# Patient Record
Sex: Female | Born: 1950 | Race: White | Hispanic: No | Marital: Married
Health system: Midwestern US, Academic
[De-identification: ages and names within clinical notes are randomized; demographics above are authoritative.]

---

## 2017-07-09 ENCOUNTER — Encounter: Admit: 2017-07-09 | Discharge: 2017-07-09 | Payer: MEDICARE

## 2017-07-09 ENCOUNTER — Ambulatory Visit: Admit: 2017-07-09 | Discharge: 2017-07-09 | Payer: MEDICARE

## 2017-07-09 DIAGNOSIS — R32 Unspecified urinary incontinence: ICD-10-CM

## 2017-07-09 DIAGNOSIS — E669 Obesity, unspecified: ICD-10-CM

## 2017-07-09 DIAGNOSIS — G629 Polyneuropathy, unspecified: ICD-10-CM

## 2017-07-09 DIAGNOSIS — M4802 Spinal stenosis, cervical region: ICD-10-CM

## 2017-07-09 DIAGNOSIS — H02889 Meibomian gland dysfunction of unspecified eye, unspecified eyelid: ICD-10-CM

## 2017-07-09 DIAGNOSIS — R4189 Other symptoms and signs involving cognitive functions and awareness: ICD-10-CM

## 2017-07-09 DIAGNOSIS — R413 Other amnesia: ICD-10-CM

## 2017-07-09 DIAGNOSIS — E119 Type 2 diabetes mellitus without complications: ICD-10-CM

## 2017-07-09 DIAGNOSIS — R3989 Other symptoms and signs involving the genitourinary system: ICD-10-CM

## 2017-07-09 DIAGNOSIS — M1990 Unspecified osteoarthritis, unspecified site: Principal | ICD-10-CM

## 2017-07-09 DIAGNOSIS — R29898 Other symptoms and signs involving the musculoskeletal system: ICD-10-CM

## 2017-07-09 DIAGNOSIS — G479 Sleep disorder, unspecified: ICD-10-CM

## 2017-07-09 DIAGNOSIS — E039 Hypothyroidism, unspecified: ICD-10-CM

## 2017-07-09 DIAGNOSIS — J45909 Unspecified asthma, uncomplicated: ICD-10-CM

## 2017-07-09 DIAGNOSIS — H5111 Convergence insufficiency: Principal | ICD-10-CM

## 2017-07-09 DIAGNOSIS — J309 Allergic rhinitis, unspecified: ICD-10-CM

## 2017-07-09 DIAGNOSIS — G4733 Obstructive sleep apnea (adult) (pediatric): ICD-10-CM

## 2017-07-09 DIAGNOSIS — H547 Unspecified visual loss: ICD-10-CM

## 2017-07-09 DIAGNOSIS — G47419 Narcolepsy without cataplexy: ICD-10-CM

## 2017-07-09 DIAGNOSIS — K219 Gastro-esophageal reflux disease without esophagitis: ICD-10-CM

## 2017-07-09 DIAGNOSIS — M48061 Spinal stenosis, lumbar region without neurogenic claudication: ICD-10-CM

## 2017-07-09 DIAGNOSIS — H5052 Exophoria: ICD-10-CM

## 2017-07-09 DIAGNOSIS — J40 Bronchitis, not specified as acute or chronic: ICD-10-CM

## 2017-07-09 DIAGNOSIS — F445 Conversion disorder with seizures or convulsions: ICD-10-CM

## 2017-07-09 DIAGNOSIS — K760 Fatty (change of) liver, not elsewhere classified: ICD-10-CM

## 2017-07-09 DIAGNOSIS — E785 Hyperlipidemia, unspecified: ICD-10-CM

## 2017-07-09 NOTE — Progress Notes
Body mass index is 36.55 kg/m.              Assessment and Plan:  1.  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  Convergence insufficiency  3.  MGD  4. Cataracts  The patient thought 4 pd base in helped her at both distance and near.  We'll give her that with a Fresnel prism 4 pd base in OS.  IF it helps, it can be ground into her lenses.

## 2017-07-16 ENCOUNTER — Encounter: Admit: 2017-07-16 | Discharge: 2017-07-16 | Payer: MEDICARE

## 2017-07-16 DIAGNOSIS — J309 Allergic rhinitis, unspecified: ICD-10-CM

## 2017-07-16 DIAGNOSIS — K760 Fatty (change of) liver, not elsewhere classified: ICD-10-CM

## 2017-07-16 DIAGNOSIS — H547 Unspecified visual loss: ICD-10-CM

## 2017-07-16 DIAGNOSIS — R4189 Other symptoms and signs involving cognitive functions and awareness: ICD-10-CM

## 2017-07-16 DIAGNOSIS — R29898 Other symptoms and signs involving the musculoskeletal system: ICD-10-CM

## 2017-07-16 DIAGNOSIS — K219 Gastro-esophageal reflux disease without esophagitis: ICD-10-CM

## 2017-07-16 DIAGNOSIS — G47419 Narcolepsy without cataplexy: ICD-10-CM

## 2017-07-16 DIAGNOSIS — E785 Hyperlipidemia, unspecified: ICD-10-CM

## 2017-07-16 DIAGNOSIS — G479 Sleep disorder, unspecified: ICD-10-CM

## 2017-07-16 DIAGNOSIS — R3989 Other symptoms and signs involving the genitourinary system: ICD-10-CM

## 2017-07-16 DIAGNOSIS — M48061 Spinal stenosis, lumbar region without neurogenic claudication: ICD-10-CM

## 2017-07-16 DIAGNOSIS — E119 Type 2 diabetes mellitus without complications: ICD-10-CM

## 2017-07-16 DIAGNOSIS — R32 Unspecified urinary incontinence: ICD-10-CM

## 2017-07-16 DIAGNOSIS — E039 Hypothyroidism, unspecified: ICD-10-CM

## 2017-07-16 DIAGNOSIS — R413 Other amnesia: ICD-10-CM

## 2017-07-16 DIAGNOSIS — M1990 Unspecified osteoarthritis, unspecified site: Principal | ICD-10-CM

## 2017-07-16 DIAGNOSIS — F445 Conversion disorder with seizures or convulsions: ICD-10-CM

## 2017-07-16 DIAGNOSIS — G629 Polyneuropathy, unspecified: ICD-10-CM

## 2017-07-16 DIAGNOSIS — M4802 Spinal stenosis, cervical region: ICD-10-CM

## 2017-07-16 DIAGNOSIS — G4733 Obstructive sleep apnea (adult) (pediatric): ICD-10-CM

## 2017-07-16 DIAGNOSIS — E669 Obesity, unspecified: ICD-10-CM

## 2017-07-16 DIAGNOSIS — J45909 Unspecified asthma, uncomplicated: ICD-10-CM

## 2017-07-16 DIAGNOSIS — J40 Bronchitis, not specified as acute or chronic: ICD-10-CM

## 2017-07-16 NOTE — Telephone Encounter
Initial Visit Date: 07/30/17- update since last appt on 11/06/15    Reason for Visit: neck pain, neuropathy, lumbar stenosis, headache, fogginess, word finding, seizures, feels like she is in a daze, imbalance    Physician Information  PCP: Dr. Isidore Moos at [facility]  Referring Physician: N/a previous pt of Dr. Barbera Setters  Previous Neurologist: Dr. Lindon Romp at [facility]  Other providers seen: [facility] (OR), Dr. Candee [facility], Dr. Verlan [facility], Dr. Harrell Lark [facility], Dr. Aurelio Jew & Dr. Vallarie Mare [facility], Dr. Bronwen Betters [facility], Dr. Kayren Eaves [facility]    Previous Imaging/Procedures:  MRI head- 02/12/15 [place]; 08/29/00, 10/03/99 [facility]  MRA head/neck- 10/03/99 [facility]  MRI C-spine- 01/24/17, 02/10/15 [place]  MRI L-spine- 01/24/17, 07/01/15 [place]; 06/08/14 [facility]  CT head- 11/24/04 [place]; 05/14/14, 01/30/14 [facility]  CT C-spine- 10/23/15, 07/19/15, 06/05/15 [place]  CT L-spine- 10/31/15 [place]  CT myelogram- 10/31/15 [place]  XRay C-spine- 02/05/17, 04/08/16, 12/04/15, 10/23/15, 09/11/15, 04/10/15 [place]  XRay L-spine- 02/05/17, 12/04/15, 07/01/15 ; 02/13/05 [facility]  EMG none  EEG 08/29/00 [facility]  Sleep study- 08/03/15 ; 06/07/14 [facility]  Video swallow- [facility] 2018    ED/Hospitalizations:  none    Current Meds:  Tylenol, norco, mobic, lyrica, effexor XR    Medications, Allergies, History Updated

## 2017-07-22 ENCOUNTER — Encounter: Admit: 2017-07-22 | Discharge: 2017-07-22 | Payer: MEDICARE

## 2017-07-22 ENCOUNTER — Encounter: Admit: 2017-07-22 | Discharge: 2017-07-23 | Payer: MEDICARE

## 2017-07-22 DIAGNOSIS — R69 Illness, unspecified: Principal | ICD-10-CM

## 2017-07-24 ENCOUNTER — Encounter: Admit: 2017-07-24 | Discharge: 2017-07-24 | Payer: MEDICARE

## 2017-07-24 ENCOUNTER — Encounter: Admit: 2017-07-24 | Discharge: 2017-07-25 | Payer: MEDICARE

## 2017-07-24 DIAGNOSIS — R69 Illness, unspecified: Principal | ICD-10-CM

## 2017-07-30 ENCOUNTER — Encounter: Admit: 2017-07-30 | Discharge: 2017-07-30 | Payer: MEDICARE

## 2017-07-30 DIAGNOSIS — H547 Unspecified visual loss: ICD-10-CM

## 2017-07-30 DIAGNOSIS — R413 Other amnesia: ICD-10-CM

## 2017-07-30 DIAGNOSIS — E669 Obesity, unspecified: ICD-10-CM

## 2017-07-30 DIAGNOSIS — R4189 Other symptoms and signs involving cognitive functions and awareness: ICD-10-CM

## 2017-07-30 DIAGNOSIS — K760 Fatty (change of) liver, not elsewhere classified: ICD-10-CM

## 2017-07-30 DIAGNOSIS — J40 Bronchitis, not specified as acute or chronic: ICD-10-CM

## 2017-07-30 DIAGNOSIS — R29898 Other symptoms and signs involving the musculoskeletal system: ICD-10-CM

## 2017-07-30 DIAGNOSIS — E039 Hypothyroidism, unspecified: ICD-10-CM

## 2017-07-30 DIAGNOSIS — M1990 Unspecified osteoarthritis, unspecified site: Principal | ICD-10-CM

## 2017-07-30 DIAGNOSIS — G629 Polyneuropathy, unspecified: ICD-10-CM

## 2017-07-30 DIAGNOSIS — R32 Unspecified urinary incontinence: ICD-10-CM

## 2017-07-30 DIAGNOSIS — M4802 Spinal stenosis, cervical region: ICD-10-CM

## 2017-07-30 DIAGNOSIS — E785 Hyperlipidemia, unspecified: ICD-10-CM

## 2017-07-30 DIAGNOSIS — K219 Gastro-esophageal reflux disease without esophagitis: ICD-10-CM

## 2017-07-30 DIAGNOSIS — M48061 Spinal stenosis, lumbar region without neurogenic claudication: ICD-10-CM

## 2017-07-30 DIAGNOSIS — G4733 Obstructive sleep apnea (adult) (pediatric): ICD-10-CM

## 2017-07-30 DIAGNOSIS — J309 Allergic rhinitis, unspecified: ICD-10-CM

## 2017-07-30 DIAGNOSIS — R3989 Other symptoms and signs involving the genitourinary system: ICD-10-CM

## 2017-07-30 DIAGNOSIS — E119 Type 2 diabetes mellitus without complications: ICD-10-CM

## 2017-07-30 DIAGNOSIS — G479 Sleep disorder, unspecified: ICD-10-CM

## 2017-07-30 DIAGNOSIS — G47419 Narcolepsy without cataplexy: ICD-10-CM

## 2017-07-30 DIAGNOSIS — J45909 Unspecified asthma, uncomplicated: ICD-10-CM

## 2017-07-30 DIAGNOSIS — F445 Conversion disorder with seizures or convulsions: ICD-10-CM
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
## 2017-07-31 ENCOUNTER — Ambulatory Visit: Admit: 2017-07-30 | Discharge: 2017-07-31 | Payer: MEDICARE

## 2017-07-31 DIAGNOSIS — R55 Syncope and collapse: Principal | ICD-10-CM

## 2017-07-31 DIAGNOSIS — R569 Unspecified convulsions: ICD-10-CM

## 2017-07-31 LAB — CREATININE: Lab: 0.8 mg/dL (ref 0.50–0.99)

## 2017-07-31 LAB — BUN: Lab: 26 mg/dL — ABNORMAL HIGH (ref 7–25)

## 2017-08-07 ENCOUNTER — Ambulatory Visit: Admit: 2017-08-07 | Discharge: 2017-08-07 | Payer: MEDICARE

## 2017-08-07 DIAGNOSIS — R55 Syncope and collapse: Principal | ICD-10-CM

## 2017-08-07 MED ORDER — SODIUM CHLORIDE 0.9 % IJ SOLN
50 mL | Freq: Once | INTRAVENOUS | 0 refills | Status: CP
Start: 2017-08-07 — End: ?
  Administered 2017-08-07: 21:00:00 50 mL via INTRAVENOUS

## 2017-08-07 MED ORDER — IOHEXOL 350 MG IODINE/ML IV SOLN
80 mL | Freq: Once | INTRAVENOUS | 0 refills | Status: CP
Start: 2017-08-07 — End: ?
  Administered 2017-08-07: 21:00:00 80 mL via INTRAVENOUS

## 2017-08-12 ENCOUNTER — Encounter: Admit: 2017-08-12 | Discharge: 2017-08-12 | Payer: MEDICARE

## 2017-08-12 DIAGNOSIS — R69 Illness, unspecified: Principal | ICD-10-CM

## 2017-08-27 ENCOUNTER — Encounter: Admit: 2017-08-27 | Discharge: 2017-08-27 | Payer: MEDICARE

## 2017-08-27 DIAGNOSIS — M9989 Other biomechanical lesions of abdomen and other regions: Principal | ICD-10-CM

## 2017-09-01 ENCOUNTER — Encounter: Admit: 2017-09-01 | Discharge: 2017-09-01 | Payer: MEDICARE

## 2017-09-22 ENCOUNTER — Ambulatory Visit: Admit: 2017-09-22 | Discharge: 2017-09-22 | Payer: MEDICARE

## 2017-09-22 ENCOUNTER — Encounter: Admit: 2017-09-22 | Discharge: 2017-09-22 | Payer: MEDICARE

## 2017-09-22 DIAGNOSIS — R55 Syncope and collapse: Principal | ICD-10-CM

## 2017-09-22 NOTE — Progress Notes
EEG REPORT  SHEKIRA DRUMMER   Aug 24, 1951  [id number]      DATE OF STUDY 09/22/17  Start time 1338  End time 1445        PATIENT HISTORY: This is a 66 y.o. female with a history of prior diagnosis of psychogenic non-epileptic seizures now wiht a new type of spell.  MEDICATIONS:  Current Outpatient Prescriptions on File Prior to Encounter   Medication Sig Dispense Refill   ??? acetaminophen (TYLENOL) 325 mg tablet Take 2 Tabs by mouth every 4 hours as needed. (Patient taking differently: Take 650 mg by mouth as Needed.)  0   ??? aspirin 81 mg chewable tablet Chew 81 mg by mouth daily.     ??? CALCIUM CARBONATE (CALTRATE 600 PO) Take 1 Tab by mouth twice daily.     ??? ergocalciferol (VITAMIN D-2) 50,000 unit capsule Take 50,000 Units by mouth every 7 days.     ??? escitalopram oxalate (LEXAPRO) 20 mg tablet Take 20 mg by mouth at bedtime daily.     ??? fenofibrate nanocrystallized (TRICOR) 145 mg tablet Take 145 mg by mouth at bedtime daily.     ??? fexofenadine(+) (ALLEGRA) 180 mg tablet Take 180 mg by mouth at bedtime daily.     ??? fish oil- omega 3-DHA/EPA 300/1,000 mg capsule Take 1 Cap by mouth at bedtime daily.     ??? furosemide (LASIX) 40 mg tablet Take 40 mg by mouth as Needed.     ??? HYDROcodone/acetaminophen (NORCO) 5/325 mg tablet Take 1 tablet by mouth every 8 hours as needed for Pain 40 tablet 0   ??? insulin aspart (NOVOLOG) 100 unit/mL injection Take 25 units with breakfast, 22 units with lunch, and 30 units with dinner.  Indications: TYPE 2 DIABETES MELLITUS (Patient taking differently: Take 25 units with breakfast, 22 units with lunch, and 30 units with dinner. Sliding scale) 15 mL 1   ??? Insulin Needles (Disposable) (NANO PEN NEEDLE) 32 gauge x 5/32 ndle Use 1 Each as directed before meals and at bedtime. 100 Each 1   ??? Insulin NPH (HUMULIN N KWIKPEN) 100 unit/mL (3 mL) injection PEN Take 25 units in AM and 42 units at bedtime. (Patient taking differently: Take 50 units at bedtime.) 15 mL 1 ??? levothyroxine (SYNTHROID) 88 mcg tablet Take 88 mcg by mouth daily.     ??? meloxicam (MOBIC) 15 mg tablet Take 15 mg by mouth daily.     ??? metFORMIN-XR(+) (GLUCOPHAGE XR) 500 mg extended release tablet Take 500 mg by mouth twice daily.     ??? montelukast (SINGULAIR) 10 mg tablet Take 10 mg by mouth at bedtime daily.     ??? MULTIVITAMINS WITH FLUORIDE (MULTI-VITAMIN PO) Take 1 Tab by mouth at bedtime daily.     ??? nystatin (MYCOSTATIN) 100,000 unit/g topical cream Apply  topically to affected area daily as needed.     ??? oxybutynin XL (DITROPAN XL) 10 mg tablet Take 1 Tab by mouth daily. Do not cut/ crush/ chew 90 Tab 3   ??? oxyCODONE (ROXICODONE, OXY-IR) 5 mg tablet Take 1-3 Tabs by mouth every 4 hours as needed for Pain (Patient taking differently: Take 1-3 Tabs by mouth as Needed for Pain ) 180 Tab 0   ??? pantoprazole DR (PROTONIX) 40 mg tablet Take 40 mg by mouth twice daily.     ??? potassium chloride SR (K-DUR) 20 mEq tablet Take 20 mEq by mouth as Needed. With lasix     ??? Pramipexole (MIRAPEX) 0.75  mg tab Take 0.75 mg by mouth twice daily.     ??? senna/docusate (SENOKOT-S) 8.6/50 mg tablet Take 1 Tab by mouth twice daily. (Patient taking differently: Take 1 tablet by mouth twice daily as needed.)  0   ??? simvastatin (ZOCOR) 40 mg tablet Take 40 mg by mouth at bedtime daily.     ??? sinus rinse pkdv kit Insert 1 Packet into nose as directed as Needed.     ??? venlafaxine XR (EFFEXOR XR) 37.5 mg capsule Take 37.5 mg by mouth daily.     ??? vitamin E 400 unit capsule Take 400 mg by mouth daily.       No current facility-administered medications on file prior to encounter.          TECHNICAL: This is EEG was intended to be awake, drowsy and asleep and greater than one hour in duration. The EEG was performed on a 32 channel digital recording device. The 10-20 international system of electrode placement was used. Hyperventilation was performed. Photic stimulation was performed.    REPORT:    BACKGROUND: Background activity consisted of moderate voltage 10 Hz activity. This activity attenuates with eye opening. Beta activity was present in the bifrontal head regions.     SLEEP:  As the patient entered drowsiness there was an attenuation of background rhythms. Stage II sleep was seen as evidenced by sleep spindles and K-complexes.    Hyperventilation produced no changes.   Photic Stimulation produced bilateral driving of the occipital rhythms.      ABNORMALITIES:  Epileptiform activity was not seen.  Slowing was not seen.      IMPRESSION    This is a normal awake, drowsy and asleep EEG. No epileptiform activity was seen.       Jacobo Forest, MD

## 2017-09-22 NOTE — Progress Notes
65 minute out patient EEG completed without difficulty.

## 2017-09-22 NOTE — Telephone Encounter
EEG 09/22/17 read by Dr. Sheran Luz: IMPRESSION This is a normal awake, drowsy and asleep EEG. No epileptiform activity was seen.     Please let Hailey Rodgers know that the EEG was normal. We also have an appt later this week to discuss how she is doing.
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
## 2017-09-23 NOTE — Telephone Encounter
Notified patient of results, patient verbalized understanding.

## 2017-09-24 ENCOUNTER — Encounter: Admit: 2017-09-24 | Discharge: 2017-09-24 | Payer: MEDICARE

## 2017-09-24 ENCOUNTER — Ambulatory Visit: Admit: 2017-09-24 | Discharge: 2017-09-25 | Payer: MEDICARE

## 2017-09-24 DIAGNOSIS — J309 Allergic rhinitis, unspecified: ICD-10-CM

## 2017-09-24 DIAGNOSIS — R6889 Other general symptoms and signs: Secondary | ICD-10-CM

## 2017-09-24 DIAGNOSIS — J40 Bronchitis, not specified as acute or chronic: ICD-10-CM

## 2017-09-24 DIAGNOSIS — G47419 Narcolepsy without cataplexy: ICD-10-CM

## 2017-09-24 DIAGNOSIS — K219 Gastro-esophageal reflux disease without esophagitis: ICD-10-CM

## 2017-09-24 DIAGNOSIS — H547 Unspecified visual loss: ICD-10-CM

## 2017-09-24 DIAGNOSIS — E119 Type 2 diabetes mellitus without complications: ICD-10-CM

## 2017-09-24 DIAGNOSIS — M1990 Unspecified osteoarthritis, unspecified site: Principal | ICD-10-CM

## 2017-09-24 DIAGNOSIS — J45909 Unspecified asthma, uncomplicated: ICD-10-CM

## 2017-09-24 DIAGNOSIS — E785 Hyperlipidemia, unspecified: ICD-10-CM

## 2017-09-24 DIAGNOSIS — R4189 Other symptoms and signs involving cognitive functions and awareness: ICD-10-CM

## 2017-09-24 DIAGNOSIS — R413 Other amnesia: ICD-10-CM

## 2017-09-24 DIAGNOSIS — M48061 Spinal stenosis, lumbar region without neurogenic claudication: ICD-10-CM

## 2017-09-24 DIAGNOSIS — R3989 Other symptoms and signs involving the genitourinary system: ICD-10-CM

## 2017-09-24 DIAGNOSIS — K760 Fatty (change of) liver, not elsewhere classified: ICD-10-CM

## 2017-09-24 DIAGNOSIS — R32 Unspecified urinary incontinence: ICD-10-CM

## 2017-09-24 DIAGNOSIS — F445 Conversion disorder with seizures or convulsions: ICD-10-CM

## 2017-09-24 DIAGNOSIS — M4802 Spinal stenosis, cervical region: ICD-10-CM

## 2017-09-24 DIAGNOSIS — E669 Obesity, unspecified: ICD-10-CM

## 2017-09-24 DIAGNOSIS — E039 Hypothyroidism, unspecified: ICD-10-CM

## 2017-09-24 DIAGNOSIS — G4733 Obstructive sleep apnea (adult) (pediatric): ICD-10-CM

## 2017-09-24 DIAGNOSIS — G479 Sleep disorder, unspecified: ICD-10-CM

## 2017-09-24 DIAGNOSIS — G629 Polyneuropathy, unspecified: ICD-10-CM

## 2017-09-24 DIAGNOSIS — R29898 Other symptoms and signs involving the musculoskeletal system: ICD-10-CM

## 2017-09-25 DIAGNOSIS — F332 Major depressive disorder, recurrent severe without psychotic features: Principal | ICD-10-CM

## 2017-10-08 ENCOUNTER — Encounter: Admit: 2017-10-08 | Discharge: 2017-10-08 | Payer: MEDICARE

## 2017-10-08 ENCOUNTER — Ambulatory Visit: Admit: 2017-10-08 | Discharge: 2017-10-09 | Payer: MEDICARE

## 2017-10-08 DIAGNOSIS — E785 Hyperlipidemia, unspecified: ICD-10-CM

## 2017-10-08 DIAGNOSIS — R29898 Other symptoms and signs involving the musculoskeletal system: ICD-10-CM

## 2017-10-08 DIAGNOSIS — R4189 Other symptoms and signs involving cognitive functions and awareness: ICD-10-CM

## 2017-10-08 DIAGNOSIS — E119 Type 2 diabetes mellitus without complications: ICD-10-CM

## 2017-10-08 DIAGNOSIS — E039 Hypothyroidism, unspecified: ICD-10-CM

## 2017-10-08 DIAGNOSIS — F445 Conversion disorder with seizures or convulsions: ICD-10-CM

## 2017-10-08 DIAGNOSIS — K760 Fatty (change of) liver, not elsewhere classified: ICD-10-CM

## 2017-10-08 DIAGNOSIS — G4733 Obstructive sleep apnea (adult) (pediatric): ICD-10-CM

## 2017-10-08 DIAGNOSIS — M48061 Spinal stenosis, lumbar region without neurogenic claudication: ICD-10-CM

## 2017-10-08 DIAGNOSIS — H5111 Convergence insufficiency: Principal | ICD-10-CM

## 2017-10-08 DIAGNOSIS — M1990 Unspecified osteoarthritis, unspecified site: Principal | ICD-10-CM

## 2017-10-08 DIAGNOSIS — H547 Unspecified visual loss: ICD-10-CM

## 2017-10-08 DIAGNOSIS — M4802 Spinal stenosis, cervical region: ICD-10-CM

## 2017-10-08 DIAGNOSIS — R413 Other amnesia: ICD-10-CM

## 2017-10-08 DIAGNOSIS — J45909 Unspecified asthma, uncomplicated: ICD-10-CM

## 2017-10-08 DIAGNOSIS — K219 Gastro-esophageal reflux disease without esophagitis: ICD-10-CM

## 2017-10-08 DIAGNOSIS — J40 Bronchitis, not specified as acute or chronic: ICD-10-CM

## 2017-10-08 DIAGNOSIS — R3989 Other symptoms and signs involving the genitourinary system: ICD-10-CM

## 2017-10-08 DIAGNOSIS — G629 Polyneuropathy, unspecified: ICD-10-CM

## 2017-10-08 DIAGNOSIS — J309 Allergic rhinitis, unspecified: ICD-10-CM

## 2017-10-08 DIAGNOSIS — R32 Unspecified urinary incontinence: ICD-10-CM

## 2017-10-08 DIAGNOSIS — G479 Sleep disorder, unspecified: ICD-10-CM

## 2017-10-08 DIAGNOSIS — G47419 Narcolepsy without cataplexy: ICD-10-CM

## 2017-10-08 DIAGNOSIS — E669 Obesity, unspecified: ICD-10-CM

## 2017-10-08 NOTE — Progress Notes
There is no height or weight on file to calculate BMI.              Assessment and Plan:  1.  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  Convergence insufficiency  3.  MGD  4. Cataracts    The patient thought the prism helped her read so we'll give her scripts for two glasses:  One bifocal without prism, and then prism readers with 2 base in each eye.

## 2017-10-21 ENCOUNTER — Ambulatory Visit: Admit: 2017-10-21 | Discharge: 2017-10-21 | Payer: MEDICARE

## 2017-10-21 DIAGNOSIS — F411 Generalized anxiety disorder: ICD-10-CM

## 2017-10-21 DIAGNOSIS — F341 Dysthymic disorder: Principal | ICD-10-CM

## 2017-10-21 MED ORDER — FLUOXETINE 20 MG PO CAP
20 mg | ORAL_CAPSULE | Freq: Every day | ORAL | 2 refills | Status: AC
Start: 2017-10-21 — End: 2017-11-25
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
## 2017-10-27 NOTE — Progress Notes
ATTENDING NOTE      Encounter Date: 10/21/2017    I saw and evaluated Hailey Rodgers, discussed with Joana Reamer, MD and concur with the assessment and treatment plan.     PRESENTING PROBLEM AND BACKGROUND: Patient is 66 y.o. female with Persistent depressive disorder and GAD.    CURRENT TREATMENT AND RESPONSES:   Presents to clinic to establish care for worsening depression and anxiety.  ALso reports some intermittent passive suicidal thoughts which she denies at the time of her psychiatry evaluation.    PLAN:  1. Will transition from Lexapro to Prozac due to lack of benefit with Lexapro per her report.  2. Decrease Lexapro to 10 mg daily for 1 week, then stop.  3. Start Fluoxetine 20 mg daily for mood.  4. Encouraged her to engage in therapy.

## 2017-11-25 ENCOUNTER — Ambulatory Visit: Admit: 2017-11-25 | Discharge: 2017-11-25 | Payer: MEDICARE

## 2017-11-25 DIAGNOSIS — F341 Dysthymic disorder: Principal | ICD-10-CM

## 2017-11-25 DIAGNOSIS — F411 Generalized anxiety disorder: ICD-10-CM

## 2017-11-25 MED ORDER — HYDROXYZINE HCL 10 MG PO TAB
10 mg | ORAL_TABLET | Freq: Two times a day (BID) | ORAL | 2 refills | 30.00000 days | Status: AC | PRN
Start: 2017-11-25 — End: 2017-11-25

## 2017-11-25 MED ORDER — HYDROXYZINE HCL 10 MG PO TAB
10 mg | ORAL_TABLET | Freq: Two times a day (BID) | ORAL | 2 refills | 30.00000 days | Status: AC | PRN
Start: 2017-11-25 — End: 2018-06-03

## 2017-11-25 MED ORDER — FLUOXETINE 20 MG PO CAP
40 mg | ORAL_CAPSULE | Freq: Every day | ORAL | 2 refills | Status: AC
Start: 2017-11-25 — End: 2018-02-23

## 2017-11-25 NOTE — Progress Notes
Subjective:       Hailey Rodgers is 66 y.o. presents with history of Persistent Depressive Disorder with anxious distress, GAD with panic attacks presents for follow up today after her first initial visit 10/21/17. Patient was later joined by her daughter to provide support and collaberation.  ???  Patient states things are not going good for her. She states she is much more short tempered. She states last week really stressed her related to financial stress and a poor relationship with her husband. She states she even started to feel suicidal but without a plan. Her family are strong protective factors for her which would keep her from killing herself. She states she enjoys giving gifts to others for happiness but is limited by her finances. She states she is now overdrawn at the bank which makes her feel anxious and worried. She is unsure how to solve the problem. Sleep is so-so at night with frequent awakenings and leg pain. Appetite is stable. Concentration and attention are reduced. She states she is only able to watch tv at small increments of time. She denies SI/HI/AVH.      Social history update: Lives at home with her husband and daughter. 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 No ETOH, no illicit drugs.              Review of Systems   HENT: Negative for rhinorrhea, sinus pain and sinus pressure.    Respiratory: Negative for cough.    Cardiovascular: Negative for chest pain and palpitations.   Gastrointestinal: Negative for constipation, diarrhea and nausea.   Musculoskeletal: Positive for arthralgias.   Neurological: Positive for weakness.   Psychiatric/Behavioral: Positive for agitation, decreased concentration, dysphoric mood and sleep disturbance. Negative for behavioral problems, confusion, hallucinations, self-injury and suicidal ideas. The patient is nervous/anxious. The patient is not hyperactive.          Objective: ??? acetaminophen (TYLENOL) 325 mg tablet Take 2 Tabs by mouth every 4 hours as needed. (Patient taking differently: Take 650 mg by mouth as Needed.)   ??? aspirin 81 mg chewable tablet Chew 81 mg by mouth daily.   ??? CALCIUM CARBONATE (CALTRATE 600 PO) Take 1 Tab by mouth twice daily.   ??? celecoxib (CELEBREX) 100 mg capsule Take 100 mg by mouth twice daily.   ??? ergocalciferol (VITAMIN D-2) 50,000 unit capsule Take 50,000 Units by mouth every 7 days.   ??? fenofibrate nanocrystallized (TRICOR) 145 mg tablet Take 145 mg by mouth at bedtime daily.   ??? fexofenadine(+) (ALLEGRA) 180 mg tablet Take 180 mg by mouth at bedtime daily.   ??? fish oil- omega 3-DHA/EPA 300/1,000 mg capsule Take 1 Cap by mouth at bedtime daily.   ??? fluoxetine (PROZAC) 20 mg capsule Take one capsule by mouth daily.   ??? furosemide (LASIX) 40 mg tablet Take 40 mg by mouth as Needed.   ??? HYDROcodone/acetaminophen (NORCO) 5/325 mg tablet Take 1 tablet by mouth every 8 hours as needed for Pain   ??? insulin aspart (NOVOLOG) 100 unit/mL injection Take 25 units with breakfast, 22 units with lunch, and 30 units with dinner.  Indications: TYPE 2 DIABETES MELLITUS (Patient taking differently: Take 25 units with breakfast, 22 units with lunch, and 30 units with dinner. Sliding scale)   ??? Insulin Needles (Disposable) (NANO PEN NEEDLE) 32 gauge x 5/32 ndle Use 1 Each as directed before meals and at bedtime.   ??? Insulin NPH (HUMULIN N KWIKPEN) 100 unit/mL (3 mL) injection PEN Take 25  units in AM and 42 units at bedtime. (Patient taking differently: Take 50 units at bedtime.)   ??? levothyroxine (SYNTHROID) 88 mcg tablet Take 88 mcg by mouth daily.   ??? metFORMIN-XR(+) (GLUCOPHAGE XR) 500 mg extended release tablet Take 500 mg by mouth twice daily.   ??? montelukast (SINGULAIR) 10 mg tablet Take 10 mg by mouth at bedtime daily.   ??? MULTIVITAMINS WITH FLUORIDE (MULTI-VITAMIN PO) Take 1 Tab by mouth at bedtime daily. ??? nystatin (MYCOSTATIN) 100,000 unit/g topical cream Apply  topically to affected area daily as needed.   ??? oxybutynin XL (DITROPAN XL) 10 mg tablet Take 1 Tab by mouth daily. Do not cut/ crush/ chew   ??? oxyCODONE (ROXICODONE, OXY-IR) 5 mg tablet Take 1-3 Tabs by mouth every 4 hours as needed for Pain (Patient taking differently: Take 1-3 Tabs by mouth as Needed for Pain )   ??? pantoprazole DR (PROTONIX) 40 mg tablet Take 40 mg by mouth twice daily.   ??? potassium chloride SR (K-DUR) 20 mEq tablet Take 20 mEq by mouth as Needed. With lasix   ??? Pramipexole (MIRAPEX) 0.75 mg tab Take 0.75 mg by mouth twice daily.   ??? senna/docusate (SENOKOT-S) 8.6/50 mg tablet Take 1 Tab by mouth twice daily. (Patient taking differently: Take 1 tablet by mouth twice daily as needed.)   ??? simvastatin (ZOCOR) 40 mg tablet Take 40 mg by mouth at bedtime daily.   ??? sinus rinse pkdv kit Insert 1 Packet into nose as directed as Needed.   ??? vitamin E 400 unit capsule Take 400 mg by mouth daily.     Vitals:    11/25/17 1445   BP: 148/55   Pulse: 75   Weight: 101.6 kg (224 lb)   Height: 172.2 cm (67.8)     Body mass index is 34.26 kg/m???.     Physical Exam   Psychiatric:   General/Constitutional: patient is a 66 year old Caucasian female dressed in age appropriate attire.  Eye Contact: maintained throughout interview.  Behavior: cooperative and pleasant  Speech: normal RRTV  Mood: depressed  Affect: dysphoric, mood congruent  Thought Process: linear and goal directed  Thought Content: denies SI/HI  Perception: denies AVH  Associations: intact  Insight: fair  Judgement: fair      Gait: normal               Assessment and Plan:  IMPRESSION DIAGNOSIS:                 AXIS I:  Persistent Depressive Disorder with anxious distress.                          GAD with panic attacks               AXIS II:  deferred               AXIS III:  DM, Hypothyroid, Restless leg, Pseudoseizures, neck and back pain AXIS IV:  PSYCHOSOCIAL STRESSORS:  Finanical, poor support network, chronic illness.               AXIS V:  CURRENT GAF:  55  ???  ???Patient presents today with increased agitation and depression since her last visit and start of fluoxetine 20mg  po dialy. She was accompanied by her daughter who also confirms increased agitation and anger outbursts. Patient has been taking fluoxetine for one month will tapering off of Lexapro. Will increase fluoxetine from 20mg   po daily to 40mg  po daily with addition of hydroxyzine 10mg  po BID prn for anxiety and agitation. Patient to return in one month or may call before if needed.    - Increase fluoxetine 40mg  po daily for mood.   - Start hydroxyzine 10mg  po BID prn for anxiety and agitation.   -Recommended outpatient psychotherapy for support; patient states she recently turned in her paperwork.  ???  ???  RTC: 4 weeks.  ???  Seen and discussed with Dr. Hulen Skains who agrees with the paln.  ???  The proposed treatment plan was discussed with the patient/guardian who was provided the opportunity to ask questions and make suggestions regarding alternative treatment.   ???  Patient informed of after hours crisis services with resident physician on call, 911 and ER if she was to become suicidal or in need of acute care. Patient agrees with the plan if needed.   ???  ???

## 2017-11-26 NOTE — Progress Notes
I performed a history and physical examination of Hailey Rodgers and discussed the management with Joana Reamer, MD.  I reviewed the resident's note and agree with the documented findings and plan of care.    Hailey Rodgers is here for follow up with her daughter.  Discussed more anxiety/agitation since transitioning from Lexapro to Prozac.  But suspect this is related to the long half life of Prozac.  She is agreeable to increasing the Prozac today to help with mood/anxiety.  She noted she wants to be "natural" without medications.  We discussed that depression/anxiety are diseases just like her diabetes and she takes insulin to feel natural so she may need to continue medications and she noted that made sense.    Brunetta Jeans May, MD

## 2017-12-30 ENCOUNTER — Ambulatory Visit: Admit: 2017-12-30 | Discharge: 2017-12-30 | Payer: MEDICARE

## 2017-12-30 ENCOUNTER — Encounter: Admit: 2017-12-30 | Discharge: 2017-12-30 | Payer: MEDICARE

## 2017-12-30 DIAGNOSIS — F341 Dysthymic disorder: ICD-10-CM

## 2017-12-30 DIAGNOSIS — F411 Generalized anxiety disorder: Principal | ICD-10-CM

## 2018-01-26 ENCOUNTER — Ambulatory Visit: Admit: 2018-01-26 | Discharge: 2018-01-26 | Payer: MEDICARE

## 2018-01-26 ENCOUNTER — Encounter: Admit: 2018-01-26 | Discharge: 2018-01-26 | Payer: MEDICARE

## 2018-01-26 DIAGNOSIS — F410 Panic disorder [episodic paroxysmal anxiety] without agoraphobia: ICD-10-CM

## 2018-01-26 DIAGNOSIS — F411 Generalized anxiety disorder: ICD-10-CM

## 2018-01-26 DIAGNOSIS — R413 Other amnesia: ICD-10-CM

## 2018-01-26 DIAGNOSIS — G629 Polyneuropathy, unspecified: ICD-10-CM

## 2018-01-26 DIAGNOSIS — E119 Type 2 diabetes mellitus without complications: ICD-10-CM

## 2018-01-26 DIAGNOSIS — H547 Unspecified visual loss: ICD-10-CM

## 2018-01-26 DIAGNOSIS — J309 Allergic rhinitis, unspecified: ICD-10-CM

## 2018-01-26 DIAGNOSIS — J45909 Unspecified asthma, uncomplicated: ICD-10-CM

## 2018-01-26 DIAGNOSIS — Z639 Problem related to primary support group, unspecified: ICD-10-CM

## 2018-01-26 DIAGNOSIS — R29898 Other symptoms and signs involving the musculoskeletal system: ICD-10-CM

## 2018-01-26 DIAGNOSIS — M4802 Spinal stenosis, cervical region: ICD-10-CM

## 2018-01-26 DIAGNOSIS — G47419 Narcolepsy without cataplexy: ICD-10-CM

## 2018-01-26 DIAGNOSIS — J40 Bronchitis, not specified as acute or chronic: ICD-10-CM

## 2018-01-26 DIAGNOSIS — R4189 Other symptoms and signs involving cognitive functions and awareness: ICD-10-CM

## 2018-01-26 DIAGNOSIS — F445 Conversion disorder with seizures or convulsions: ICD-10-CM

## 2018-01-26 DIAGNOSIS — E669 Obesity, unspecified: ICD-10-CM

## 2018-01-26 DIAGNOSIS — E785 Hyperlipidemia, unspecified: ICD-10-CM

## 2018-01-26 DIAGNOSIS — F3341 Major depressive disorder, recurrent, in partial remission: Principal | ICD-10-CM

## 2018-01-26 DIAGNOSIS — R32 Unspecified urinary incontinence: ICD-10-CM

## 2018-01-26 DIAGNOSIS — F341 Dysthymic disorder: Principal | ICD-10-CM

## 2018-01-26 DIAGNOSIS — Z72820 Sleep deprivation: ICD-10-CM

## 2018-01-26 DIAGNOSIS — K219 Gastro-esophageal reflux disease without esophagitis: ICD-10-CM

## 2018-01-26 DIAGNOSIS — M48061 Spinal stenosis, lumbar region without neurogenic claudication: ICD-10-CM

## 2018-01-26 DIAGNOSIS — G4733 Obstructive sleep apnea (adult) (pediatric): ICD-10-CM

## 2018-01-26 DIAGNOSIS — M1990 Unspecified osteoarthritis, unspecified site: Principal | ICD-10-CM

## 2018-01-26 DIAGNOSIS — R3989 Other symptoms and signs involving the genitourinary system: ICD-10-CM

## 2018-01-26 DIAGNOSIS — E039 Hypothyroidism, unspecified: ICD-10-CM

## 2018-01-26 DIAGNOSIS — K760 Fatty (change of) liver, not elsewhere classified: ICD-10-CM

## 2018-01-26 DIAGNOSIS — G479 Sleep disorder, unspecified: ICD-10-CM

## 2018-02-09 ENCOUNTER — Ambulatory Visit: Admit: 2018-02-09 | Discharge: 2018-02-10 | Payer: MEDICARE

## 2018-02-09 DIAGNOSIS — Z639 Problem related to primary support group, unspecified: ICD-10-CM

## 2018-02-09 DIAGNOSIS — F410 Panic disorder [episodic paroxysmal anxiety] without agoraphobia: ICD-10-CM

## 2018-02-09 DIAGNOSIS — F411 Generalized anxiety disorder: ICD-10-CM

## 2018-02-09 DIAGNOSIS — F3341 Major depressive disorder, recurrent, in partial remission: Principal | ICD-10-CM

## 2018-02-13 ENCOUNTER — Encounter: Admit: 2018-02-13 | Discharge: 2018-02-13 | Payer: MEDICARE

## 2018-02-13 DIAGNOSIS — E039 Hypothyroidism, unspecified: ICD-10-CM

## 2018-02-13 DIAGNOSIS — R32 Unspecified urinary incontinence: ICD-10-CM

## 2018-02-13 DIAGNOSIS — J309 Allergic rhinitis, unspecified: ICD-10-CM

## 2018-02-13 DIAGNOSIS — R3989 Other symptoms and signs involving the genitourinary system: ICD-10-CM

## 2018-02-13 DIAGNOSIS — E119 Type 2 diabetes mellitus without complications: ICD-10-CM

## 2018-02-13 DIAGNOSIS — M4802 Spinal stenosis, cervical region: ICD-10-CM

## 2018-02-13 DIAGNOSIS — F445 Conversion disorder with seizures or convulsions: ICD-10-CM

## 2018-02-13 DIAGNOSIS — R29898 Other symptoms and signs involving the musculoskeletal system: ICD-10-CM

## 2018-02-13 DIAGNOSIS — E669 Obesity, unspecified: ICD-10-CM

## 2018-02-13 DIAGNOSIS — M48061 Spinal stenosis, lumbar region without neurogenic claudication: ICD-10-CM

## 2018-02-13 DIAGNOSIS — R4189 Other symptoms and signs involving cognitive functions and awareness: ICD-10-CM

## 2018-02-13 DIAGNOSIS — G479 Sleep disorder, unspecified: ICD-10-CM

## 2018-02-13 DIAGNOSIS — J40 Bronchitis, not specified as acute or chronic: ICD-10-CM

## 2018-02-13 DIAGNOSIS — G47419 Narcolepsy without cataplexy: ICD-10-CM

## 2018-02-13 DIAGNOSIS — M1990 Unspecified osteoarthritis, unspecified site: Principal | ICD-10-CM

## 2018-02-13 DIAGNOSIS — G629 Polyneuropathy, unspecified: ICD-10-CM

## 2018-02-13 DIAGNOSIS — G4733 Obstructive sleep apnea (adult) (pediatric): ICD-10-CM

## 2018-02-13 DIAGNOSIS — H547 Unspecified visual loss: ICD-10-CM

## 2018-02-13 DIAGNOSIS — K760 Fatty (change of) liver, not elsewhere classified: ICD-10-CM

## 2018-02-13 DIAGNOSIS — E785 Hyperlipidemia, unspecified: ICD-10-CM

## 2018-02-13 DIAGNOSIS — J45909 Unspecified asthma, uncomplicated: ICD-10-CM

## 2018-02-13 DIAGNOSIS — R413 Other amnesia: ICD-10-CM

## 2018-02-13 DIAGNOSIS — K219 Gastro-esophageal reflux disease without esophagitis: ICD-10-CM

## 2018-02-16 ENCOUNTER — Ambulatory Visit: Admit: 2018-02-16 | Discharge: 2018-02-16 | Payer: MEDICARE

## 2018-02-16 DIAGNOSIS — Z639 Problem related to primary support group, unspecified: ICD-10-CM

## 2018-02-16 DIAGNOSIS — F3341 Major depressive disorder, recurrent, in partial remission: Principal | ICD-10-CM

## 2018-02-16 DIAGNOSIS — F410 Panic disorder [episodic paroxysmal anxiety] without agoraphobia: ICD-10-CM

## 2018-02-16 DIAGNOSIS — F411 Generalized anxiety disorder: ICD-10-CM

## 2018-02-19 ENCOUNTER — Encounter: Admit: 2018-02-19 | Discharge: 2018-02-19 | Payer: MEDICARE

## 2018-02-23 ENCOUNTER — Encounter: Admit: 2018-02-23 | Discharge: 2018-02-23 | Payer: MEDICARE

## 2018-02-23 DIAGNOSIS — E669 Obesity, unspecified: ICD-10-CM

## 2018-02-23 DIAGNOSIS — G47419 Narcolepsy without cataplexy: ICD-10-CM

## 2018-02-23 DIAGNOSIS — M1990 Unspecified osteoarthritis, unspecified site: Principal | ICD-10-CM

## 2018-02-23 DIAGNOSIS — R29898 Other symptoms and signs involving the musculoskeletal system: ICD-10-CM

## 2018-02-23 DIAGNOSIS — M4802 Spinal stenosis, cervical region: ICD-10-CM

## 2018-02-23 DIAGNOSIS — R4189 Other symptoms and signs involving cognitive functions and awareness: ICD-10-CM

## 2018-02-23 DIAGNOSIS — F445 Conversion disorder with seizures or convulsions: ICD-10-CM

## 2018-02-23 DIAGNOSIS — E039 Hypothyroidism, unspecified: ICD-10-CM

## 2018-02-23 DIAGNOSIS — R413 Other amnesia: ICD-10-CM

## 2018-02-23 DIAGNOSIS — E785 Hyperlipidemia, unspecified: ICD-10-CM

## 2018-02-23 DIAGNOSIS — G479 Sleep disorder, unspecified: ICD-10-CM

## 2018-02-23 DIAGNOSIS — J40 Bronchitis, not specified as acute or chronic: ICD-10-CM

## 2018-02-23 DIAGNOSIS — H547 Unspecified visual loss: ICD-10-CM

## 2018-02-23 DIAGNOSIS — R3989 Other symptoms and signs involving the genitourinary system: ICD-10-CM

## 2018-02-23 DIAGNOSIS — M48061 Spinal stenosis, lumbar region without neurogenic claudication: ICD-10-CM

## 2018-02-23 DIAGNOSIS — R32 Unspecified urinary incontinence: ICD-10-CM

## 2018-02-23 DIAGNOSIS — K219 Gastro-esophageal reflux disease without esophagitis: ICD-10-CM

## 2018-02-23 DIAGNOSIS — E119 Type 2 diabetes mellitus without complications: ICD-10-CM

## 2018-02-23 DIAGNOSIS — K760 Fatty (change of) liver, not elsewhere classified: ICD-10-CM

## 2018-02-23 DIAGNOSIS — J45909 Unspecified asthma, uncomplicated: ICD-10-CM

## 2018-02-23 DIAGNOSIS — G629 Polyneuropathy, unspecified: ICD-10-CM

## 2018-02-23 DIAGNOSIS — G4733 Obstructive sleep apnea (adult) (pediatric): ICD-10-CM

## 2018-02-23 DIAGNOSIS — J309 Allergic rhinitis, unspecified: ICD-10-CM

## 2018-02-23 MED ORDER — FLUOXETINE 20 MG PO CAP
ORAL_CAPSULE | Freq: Every day | 2 refills | Status: AC
Start: 2018-02-23 — End: 2018-03-31

## 2018-03-24 ENCOUNTER — Encounter: Admit: 2018-03-24 | Discharge: 2018-03-24 | Payer: MEDICARE

## 2018-03-24 ENCOUNTER — Ambulatory Visit: Admit: 2018-03-24 | Discharge: 2018-03-25 | Payer: MEDICARE

## 2018-03-24 DIAGNOSIS — E119 Type 2 diabetes mellitus without complications: ICD-10-CM

## 2018-03-24 DIAGNOSIS — G629 Polyneuropathy, unspecified: ICD-10-CM

## 2018-03-24 DIAGNOSIS — J40 Bronchitis, not specified as acute or chronic: ICD-10-CM

## 2018-03-24 DIAGNOSIS — E039 Hypothyroidism, unspecified: ICD-10-CM

## 2018-03-24 DIAGNOSIS — K219 Gastro-esophageal reflux disease without esophagitis: ICD-10-CM

## 2018-03-24 DIAGNOSIS — R29898 Other symptoms and signs involving the musculoskeletal system: ICD-10-CM

## 2018-03-24 DIAGNOSIS — J45909 Unspecified asthma, uncomplicated: ICD-10-CM

## 2018-03-24 DIAGNOSIS — J309 Allergic rhinitis, unspecified: ICD-10-CM

## 2018-03-24 DIAGNOSIS — H547 Unspecified visual loss: ICD-10-CM

## 2018-03-24 DIAGNOSIS — G47419 Narcolepsy without cataplexy: ICD-10-CM

## 2018-03-24 DIAGNOSIS — M48061 Spinal stenosis, lumbar region without neurogenic claudication: ICD-10-CM

## 2018-03-24 DIAGNOSIS — E669 Obesity, unspecified: ICD-10-CM

## 2018-03-24 DIAGNOSIS — M4802 Spinal stenosis, cervical region: ICD-10-CM

## 2018-03-24 DIAGNOSIS — R3989 Other symptoms and signs involving the genitourinary system: ICD-10-CM

## 2018-03-24 DIAGNOSIS — R4189 Other symptoms and signs involving cognitive functions and awareness: ICD-10-CM

## 2018-03-24 DIAGNOSIS — R413 Other amnesia: ICD-10-CM

## 2018-03-24 DIAGNOSIS — E785 Hyperlipidemia, unspecified: ICD-10-CM

## 2018-03-24 DIAGNOSIS — K760 Fatty (change of) liver, not elsewhere classified: ICD-10-CM

## 2018-03-24 DIAGNOSIS — F445 Conversion disorder with seizures or convulsions: ICD-10-CM

## 2018-03-24 DIAGNOSIS — G4733 Obstructive sleep apnea (adult) (pediatric): ICD-10-CM

## 2018-03-24 DIAGNOSIS — M1990 Unspecified osteoarthritis, unspecified site: Principal | ICD-10-CM

## 2018-03-24 DIAGNOSIS — R32 Unspecified urinary incontinence: ICD-10-CM

## 2018-03-24 DIAGNOSIS — G479 Sleep disorder, unspecified: ICD-10-CM

## 2018-03-25 DIAGNOSIS — R6889 Other general symptoms and signs: Principal | ICD-10-CM

## 2018-03-26 ENCOUNTER — Encounter: Admit: 2018-03-26 | Discharge: 2018-03-26 | Payer: MEDICARE

## 2018-03-26 DIAGNOSIS — R3989 Other symptoms and signs involving the genitourinary system: ICD-10-CM

## 2018-03-26 DIAGNOSIS — F445 Conversion disorder with seizures or convulsions: ICD-10-CM

## 2018-03-26 DIAGNOSIS — R413 Other amnesia: ICD-10-CM

## 2018-03-26 DIAGNOSIS — K219 Gastro-esophageal reflux disease without esophagitis: ICD-10-CM

## 2018-03-26 DIAGNOSIS — G47419 Narcolepsy without cataplexy: ICD-10-CM

## 2018-03-26 DIAGNOSIS — J309 Allergic rhinitis, unspecified: ICD-10-CM

## 2018-03-26 DIAGNOSIS — E669 Obesity, unspecified: ICD-10-CM

## 2018-03-26 DIAGNOSIS — E119 Type 2 diabetes mellitus without complications: ICD-10-CM

## 2018-03-26 DIAGNOSIS — K760 Fatty (change of) liver, not elsewhere classified: ICD-10-CM

## 2018-03-26 DIAGNOSIS — E785 Hyperlipidemia, unspecified: ICD-10-CM

## 2018-03-26 DIAGNOSIS — H547 Unspecified visual loss: ICD-10-CM

## 2018-03-26 DIAGNOSIS — G4733 Obstructive sleep apnea (adult) (pediatric): ICD-10-CM

## 2018-03-26 DIAGNOSIS — R29898 Other symptoms and signs involving the musculoskeletal system: ICD-10-CM

## 2018-03-26 DIAGNOSIS — J45909 Unspecified asthma, uncomplicated: ICD-10-CM

## 2018-03-26 DIAGNOSIS — M48061 Spinal stenosis, lumbar region without neurogenic claudication: ICD-10-CM

## 2018-03-26 DIAGNOSIS — M1990 Unspecified osteoarthritis, unspecified site: Principal | ICD-10-CM

## 2018-03-26 DIAGNOSIS — J40 Bronchitis, not specified as acute or chronic: ICD-10-CM

## 2018-03-26 DIAGNOSIS — E039 Hypothyroidism, unspecified: ICD-10-CM

## 2018-03-26 DIAGNOSIS — R32 Unspecified urinary incontinence: ICD-10-CM

## 2018-03-26 DIAGNOSIS — M4802 Spinal stenosis, cervical region: ICD-10-CM

## 2018-03-26 DIAGNOSIS — R4189 Other symptoms and signs involving cognitive functions and awareness: ICD-10-CM

## 2018-03-26 DIAGNOSIS — G629 Polyneuropathy, unspecified: ICD-10-CM

## 2018-03-26 DIAGNOSIS — G479 Sleep disorder, unspecified: ICD-10-CM

## 2018-03-30 ENCOUNTER — Ambulatory Visit: Admit: 2018-03-30 | Discharge: 2018-03-30 | Payer: MEDICARE

## 2018-03-30 DIAGNOSIS — F419 Anxiety disorder, unspecified: ICD-10-CM

## 2018-03-30 DIAGNOSIS — F39 Unspecified mood [affective] disorder: Principal | ICD-10-CM

## 2018-03-31 ENCOUNTER — Ambulatory Visit: Admit: 2018-03-31 | Discharge: 2018-03-31 | Payer: MEDICARE

## 2018-03-31 DIAGNOSIS — F411 Generalized anxiety disorder: ICD-10-CM

## 2018-03-31 DIAGNOSIS — F341 Dysthymic disorder: Principal | ICD-10-CM

## 2018-03-31 DIAGNOSIS — Z639 Problem related to primary support group, unspecified: ICD-10-CM

## 2018-03-31 MED ORDER — FLUOXETINE 20 MG PO CAP
60 mg | ORAL_CAPSULE | Freq: Every day | ORAL | 2 refills | Status: AC
Start: 2018-03-31 — End: 2018-06-03

## 2018-04-23 ENCOUNTER — Encounter: Admit: 2018-04-23 | Discharge: 2018-04-23 | Payer: MEDICARE

## 2018-05-25 ENCOUNTER — Encounter: Admit: 2018-05-25 | Discharge: 2018-05-25 | Payer: MEDICARE

## 2018-05-26 ENCOUNTER — Encounter: Admit: 2018-05-26 | Discharge: 2018-05-26 | Payer: MEDICARE

## 2018-05-26 ENCOUNTER — Ambulatory Visit: Admit: 2018-05-26 | Discharge: 2018-05-27 | Payer: MEDICARE

## 2018-05-26 DIAGNOSIS — E039 Hypothyroidism, unspecified: ICD-10-CM

## 2018-05-26 DIAGNOSIS — G47419 Narcolepsy without cataplexy: ICD-10-CM

## 2018-05-26 DIAGNOSIS — M1990 Unspecified osteoarthritis, unspecified site: Principal | ICD-10-CM

## 2018-05-26 DIAGNOSIS — H2513 Age-related nuclear cataract, bilateral: ICD-10-CM

## 2018-05-26 DIAGNOSIS — M4802 Spinal stenosis, cervical region: ICD-10-CM

## 2018-05-26 DIAGNOSIS — G479 Sleep disorder, unspecified: ICD-10-CM

## 2018-05-26 DIAGNOSIS — G4733 Obstructive sleep apnea (adult) (pediatric): ICD-10-CM

## 2018-05-26 DIAGNOSIS — K219 Gastro-esophageal reflux disease without esophagitis: ICD-10-CM

## 2018-05-26 DIAGNOSIS — G629 Polyneuropathy, unspecified: ICD-10-CM

## 2018-05-26 DIAGNOSIS — H04129 Dry eye syndrome of unspecified lacrimal gland: ICD-10-CM

## 2018-05-26 DIAGNOSIS — R29898 Other symptoms and signs involving the musculoskeletal system: ICD-10-CM

## 2018-05-26 DIAGNOSIS — R3989 Other symptoms and signs involving the genitourinary system: ICD-10-CM

## 2018-05-26 DIAGNOSIS — R32 Unspecified urinary incontinence: ICD-10-CM

## 2018-05-26 DIAGNOSIS — E785 Hyperlipidemia, unspecified: ICD-10-CM

## 2018-05-26 DIAGNOSIS — M48061 Spinal stenosis, lumbar region without neurogenic claudication: ICD-10-CM

## 2018-05-26 DIAGNOSIS — K760 Fatty (change of) liver, not elsewhere classified: ICD-10-CM

## 2018-05-26 DIAGNOSIS — J309 Allergic rhinitis, unspecified: ICD-10-CM

## 2018-05-26 DIAGNOSIS — E119 Type 2 diabetes mellitus without complications: ICD-10-CM

## 2018-05-26 DIAGNOSIS — R4189 Other symptoms and signs involving cognitive functions and awareness: ICD-10-CM

## 2018-05-26 DIAGNOSIS — J45909 Unspecified asthma, uncomplicated: ICD-10-CM

## 2018-05-26 DIAGNOSIS — H5111 Convergence insufficiency: ICD-10-CM

## 2018-05-26 DIAGNOSIS — F445 Conversion disorder with seizures or convulsions: ICD-10-CM

## 2018-05-26 DIAGNOSIS — R413 Other amnesia: ICD-10-CM

## 2018-05-26 DIAGNOSIS — H547 Unspecified visual loss: ICD-10-CM

## 2018-05-26 DIAGNOSIS — J40 Bronchitis, not specified as acute or chronic: ICD-10-CM

## 2018-05-26 DIAGNOSIS — H5052 Exophoria: Principal | ICD-10-CM

## 2018-05-26 DIAGNOSIS — E669 Obesity, unspecified: ICD-10-CM

## 2018-06-03 ENCOUNTER — Ambulatory Visit: Admit: 2018-06-03 | Discharge: 2018-06-04 | Payer: MEDICARE

## 2018-06-03 ENCOUNTER — Ambulatory Visit: Admit: 2018-06-03 | Discharge: 2018-06-03 | Payer: MEDICARE

## 2018-06-03 DIAGNOSIS — F411 Generalized anxiety disorder: Principal | ICD-10-CM

## 2018-06-03 DIAGNOSIS — F341 Dysthymic disorder: ICD-10-CM

## 2018-06-03 MED ORDER — HYDROXYZINE HCL 10 MG PO TAB
10 mg | ORAL_TABLET | Freq: Two times a day (BID) | ORAL | 2 refills | 30.00000 days | Status: AC | PRN
Start: 2018-06-03 — End: 2018-09-03

## 2018-06-03 MED ORDER — FLUOXETINE 20 MG PO CAP
60 mg | ORAL_CAPSULE | Freq: Every day | ORAL | 2 refills | Status: AC
Start: 2018-06-03 — End: 2018-09-03

## 2018-06-30 ENCOUNTER — Encounter: Admit: 2018-06-30 | Discharge: 2018-06-30 | Payer: MEDICARE

## 2018-08-31 ENCOUNTER — Encounter: Admit: 2018-08-31 | Discharge: 2018-08-31 | Payer: MEDICARE

## 2018-08-31 ENCOUNTER — Ambulatory Visit: Admit: 2018-08-31 | Discharge: 2018-08-31 | Payer: MEDICARE

## 2018-08-31 DIAGNOSIS — K219 Gastro-esophageal reflux disease without esophagitis: ICD-10-CM

## 2018-08-31 DIAGNOSIS — G479 Sleep disorder, unspecified: ICD-10-CM

## 2018-08-31 DIAGNOSIS — K760 Fatty (change of) liver, not elsewhere classified: ICD-10-CM

## 2018-08-31 DIAGNOSIS — M4802 Spinal stenosis, cervical region: ICD-10-CM

## 2018-08-31 DIAGNOSIS — E669 Obesity, unspecified: ICD-10-CM

## 2018-08-31 DIAGNOSIS — J45909 Unspecified asthma, uncomplicated: ICD-10-CM

## 2018-08-31 DIAGNOSIS — R55 Syncope and collapse: ICD-10-CM

## 2018-08-31 DIAGNOSIS — G4733 Obstructive sleep apnea (adult) (pediatric): ICD-10-CM

## 2018-08-31 DIAGNOSIS — M48062 Spinal stenosis, lumbar region with neurogenic claudication: ICD-10-CM

## 2018-08-31 DIAGNOSIS — J40 Bronchitis, not specified as acute or chronic: ICD-10-CM

## 2018-08-31 DIAGNOSIS — H547 Unspecified visual loss: ICD-10-CM

## 2018-08-31 DIAGNOSIS — G629 Polyneuropathy, unspecified: ICD-10-CM

## 2018-08-31 DIAGNOSIS — G47419 Narcolepsy without cataplexy: ICD-10-CM

## 2018-08-31 DIAGNOSIS — R32 Unspecified urinary incontinence: ICD-10-CM

## 2018-08-31 DIAGNOSIS — E119 Type 2 diabetes mellitus without complications: ICD-10-CM

## 2018-08-31 DIAGNOSIS — M48061 Spinal stenosis, lumbar region without neurogenic claudication: ICD-10-CM

## 2018-08-31 DIAGNOSIS — M1990 Unspecified osteoarthritis, unspecified site: Principal | ICD-10-CM

## 2018-08-31 DIAGNOSIS — E039 Hypothyroidism, unspecified: ICD-10-CM

## 2018-08-31 DIAGNOSIS — R6889 Other general symptoms and signs: Principal | ICD-10-CM

## 2018-08-31 DIAGNOSIS — R413 Other amnesia: ICD-10-CM

## 2018-08-31 DIAGNOSIS — J309 Allergic rhinitis, unspecified: ICD-10-CM

## 2018-08-31 DIAGNOSIS — R4189 Other symptoms and signs involving cognitive functions and awareness: ICD-10-CM

## 2018-08-31 DIAGNOSIS — R3989 Other symptoms and signs involving the genitourinary system: ICD-10-CM

## 2018-08-31 DIAGNOSIS — E785 Hyperlipidemia, unspecified: ICD-10-CM

## 2018-08-31 DIAGNOSIS — F445 Conversion disorder with seizures or convulsions: ICD-10-CM

## 2018-08-31 DIAGNOSIS — R29898 Other symptoms and signs involving the musculoskeletal system: ICD-10-CM

## 2018-08-31 LAB — CREATININE
Lab: 0.7 mg/dL (ref 0.4–1.00)
Lab: >60 mL/min (ref >60)

## 2018-08-31 LAB — BUN: Lab: 27 mg/dL — ABNORMAL HIGH (ref >60)

## 2018-09-03 ENCOUNTER — Ambulatory Visit: Admit: 2018-09-03 | Discharge: 2018-09-03 | Payer: MEDICARE

## 2018-09-03 ENCOUNTER — Ambulatory Visit: Admit: 2018-09-03 | Discharge: 2018-09-04 | Payer: MEDICARE

## 2018-09-03 MED ORDER — HYDROXYZINE HCL 10 MG PO TAB
10 mg | ORAL_TABLET | Freq: Two times a day (BID) | ORAL | 2 refills | 30.00000 days | Status: AC | PRN
Start: 2018-09-03 — End: 2018-11-12

## 2018-09-03 MED ORDER — FLUOXETINE 20 MG PO CAP
60 mg | ORAL_CAPSULE | Freq: Every day | ORAL | 2 refills | Status: AC
Start: 2018-09-03 — End: 2018-11-12

## 2018-09-04 DIAGNOSIS — F411 Generalized anxiety disorder: ICD-10-CM

## 2018-09-04 DIAGNOSIS — F445 Conversion disorder with seizures or convulsions: ICD-10-CM

## 2018-09-04 DIAGNOSIS — F341 Dysthymic disorder: Principal | ICD-10-CM

## 2018-09-05 ENCOUNTER — Encounter: Admit: 2018-09-05 | Discharge: 2018-09-05 | Payer: MEDICARE

## 2018-09-05 DIAGNOSIS — G4733 Obstructive sleep apnea (adult) (pediatric): ICD-10-CM

## 2018-09-05 DIAGNOSIS — R413 Other amnesia: ICD-10-CM

## 2018-09-05 DIAGNOSIS — R29898 Other symptoms and signs involving the musculoskeletal system: ICD-10-CM

## 2018-09-05 DIAGNOSIS — M4802 Spinal stenosis, cervical region: ICD-10-CM

## 2018-09-05 DIAGNOSIS — J309 Allergic rhinitis, unspecified: ICD-10-CM

## 2018-09-05 DIAGNOSIS — F445 Conversion disorder with seizures or convulsions: ICD-10-CM

## 2018-09-05 DIAGNOSIS — H547 Unspecified visual loss: ICD-10-CM

## 2018-09-05 DIAGNOSIS — E785 Hyperlipidemia, unspecified: ICD-10-CM

## 2018-09-05 DIAGNOSIS — G629 Polyneuropathy, unspecified: ICD-10-CM

## 2018-09-05 DIAGNOSIS — G47419 Narcolepsy without cataplexy: ICD-10-CM

## 2018-09-05 DIAGNOSIS — R32 Unspecified urinary incontinence: ICD-10-CM

## 2018-09-05 DIAGNOSIS — M1990 Unspecified osteoarthritis, unspecified site: Principal | ICD-10-CM

## 2018-09-05 DIAGNOSIS — J45909 Unspecified asthma, uncomplicated: ICD-10-CM

## 2018-09-05 DIAGNOSIS — K760 Fatty (change of) liver, not elsewhere classified: ICD-10-CM

## 2018-09-05 DIAGNOSIS — R3989 Other symptoms and signs involving the genitourinary system: ICD-10-CM

## 2018-09-05 DIAGNOSIS — R4189 Other symptoms and signs involving cognitive functions and awareness: ICD-10-CM

## 2018-09-05 DIAGNOSIS — E119 Type 2 diabetes mellitus without complications: ICD-10-CM

## 2018-09-05 DIAGNOSIS — E669 Obesity, unspecified: ICD-10-CM

## 2018-09-05 DIAGNOSIS — E039 Hypothyroidism, unspecified: ICD-10-CM

## 2018-09-05 DIAGNOSIS — K219 Gastro-esophageal reflux disease without esophagitis: ICD-10-CM

## 2018-09-05 DIAGNOSIS — G479 Sleep disorder, unspecified: ICD-10-CM

## 2018-09-05 DIAGNOSIS — M48061 Spinal stenosis, lumbar region without neurogenic claudication: ICD-10-CM

## 2018-09-05 DIAGNOSIS — J40 Bronchitis, not specified as acute or chronic: ICD-10-CM

## 2018-09-11 ENCOUNTER — Ambulatory Visit: Admit: 2018-09-11 | Discharge: 2018-09-11 | Payer: MEDICARE

## 2018-09-11 DIAGNOSIS — R55 Syncope and collapse: ICD-10-CM

## 2018-09-11 DIAGNOSIS — R6889 Other general symptoms and signs: Principal | ICD-10-CM

## 2018-09-11 MED ORDER — GADOBENATE DIMEGLUMINE 529 MG/ML (0.1MMOL/0.2ML) IV SOLN
20 mL | Freq: Once | INTRAVENOUS | 0 refills | Status: CP
Start: 2018-09-11 — End: ?
  Administered 2018-09-11: 19:00:00 20 mL via INTRAVENOUS

## 2018-09-15 IMAGING — MR Hand^ROUTINE
5 of 8 series · 24 of 40 positions shown · non-contrast
Comparison: none

[Series 3: T1 · axial · 3.0mm · 0.31mm/px · z∈[-19,+52]mm · 5 of 20 slices shown (1 of 2)]
[im 1/20]
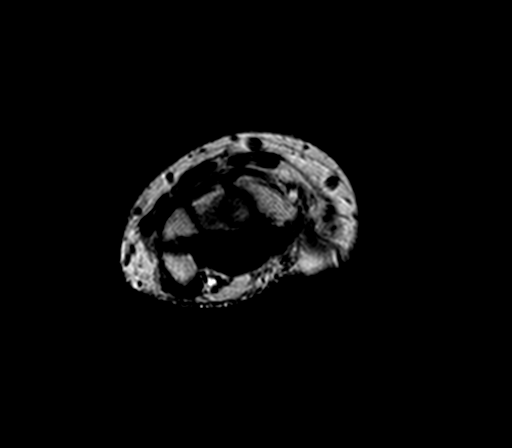
[im 5/20]
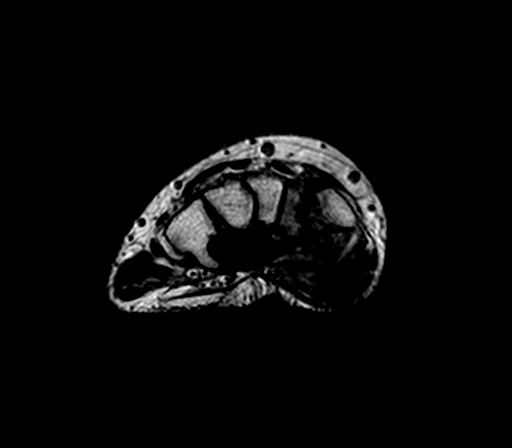
[im 10/20]
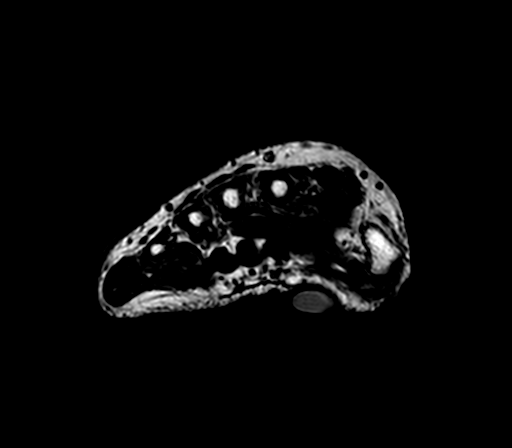
[im 15/20]
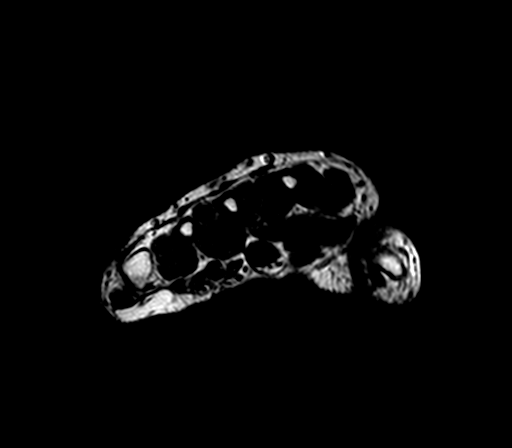
[im 20/20]
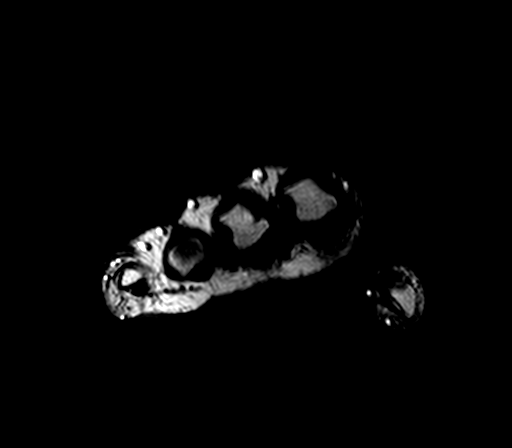

[Series 4: T2 fat-sat · axial · 3.0mm · 0.31mm/px · z∈[-17,+44]mm · 5 of 20 slices shown (1 of 3)]
[im 1/20]
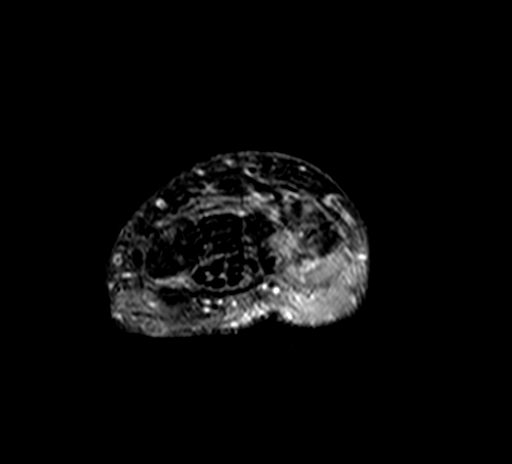
[im 5/20]
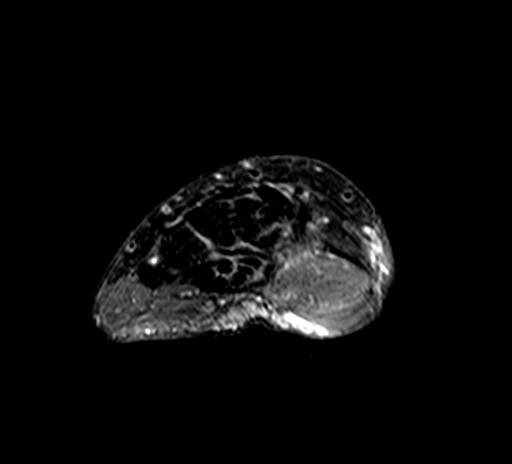
[im 10/20]
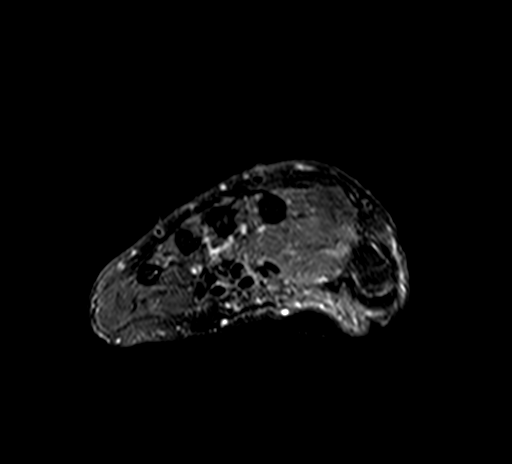
[im 15/20]
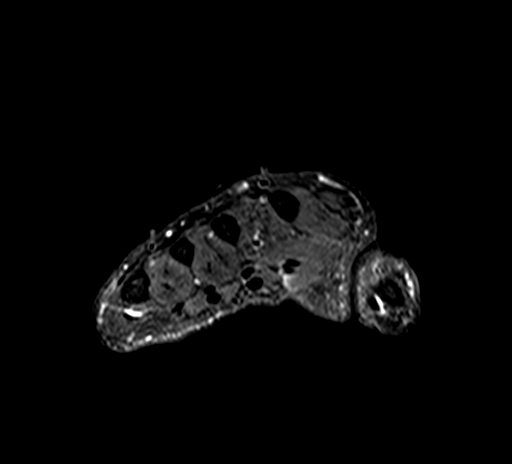
[im 20/20]
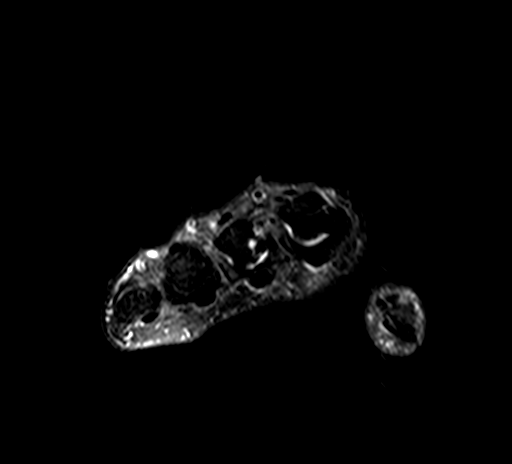

[Series 5: T1 · coronal · 3.0mm · 0.36mm/px · 5 of 16 slices shown (2 of 2)]
[im 1/16]
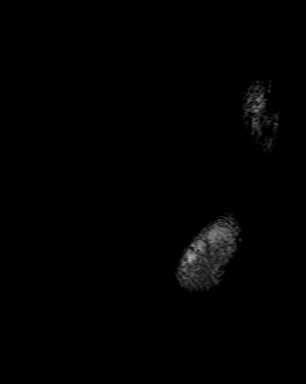
[im 4/16]
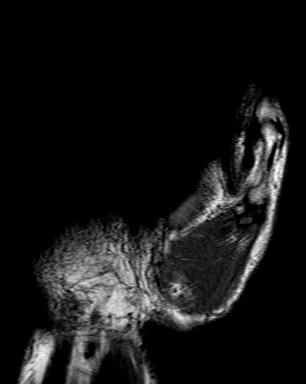
[im 8/16]
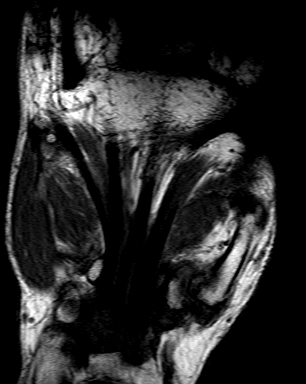
[im 12/16]
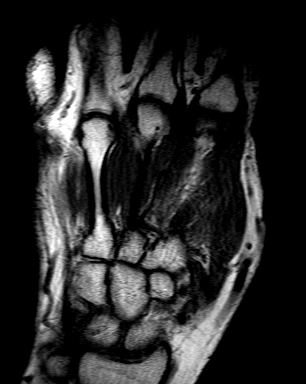
[im 16/16]
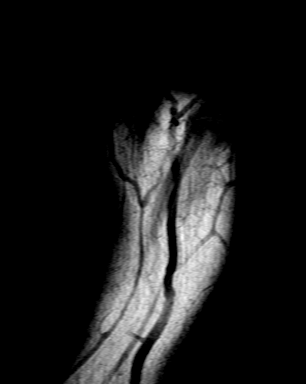

[Series 8: T2 fat-sat · sagittal · 3.0mm · 0.20mm/px · 4 of 15 slices shown (2 of 3)]
[im 1/15]
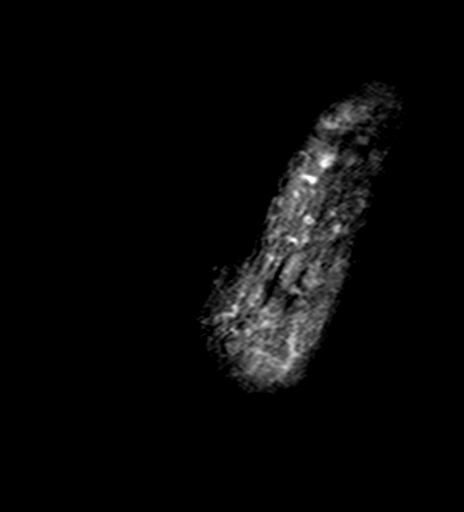
[im 5/15]
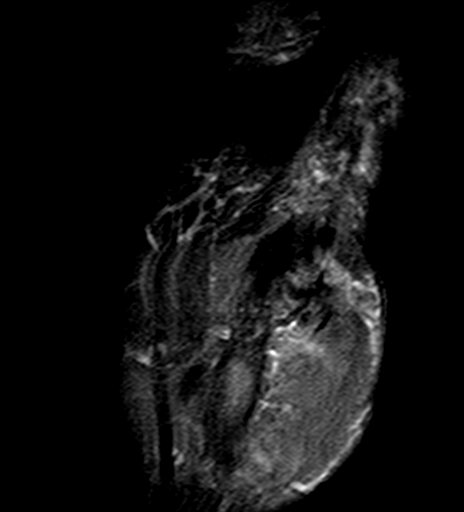
[im 10/15]
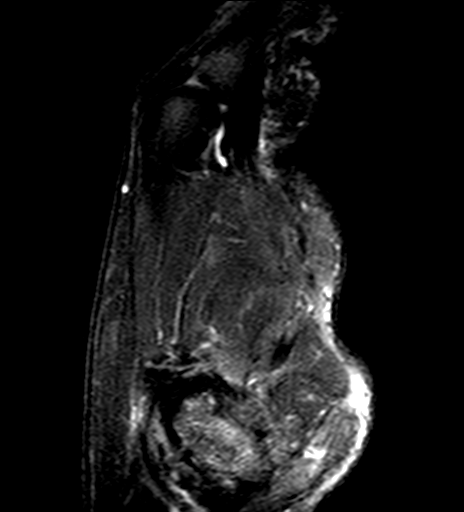
[im 15/15]
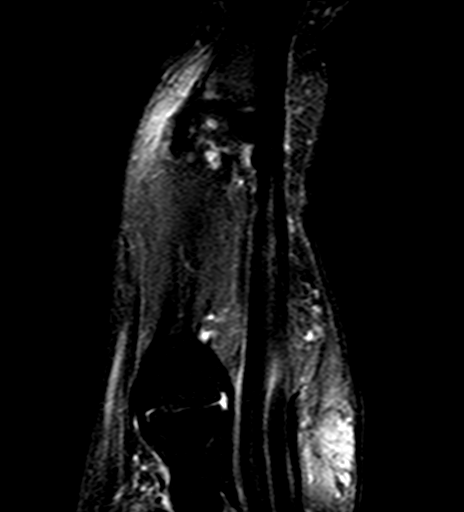

[Series 9: T2 fat-sat · sagittal · 3.0mm · 0.20mm/px · 5 of 16 slices shown (3 of 3)]
[im 1/16]
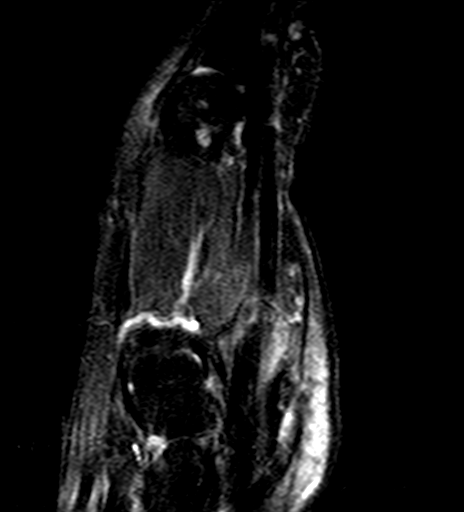
[im 4/16]
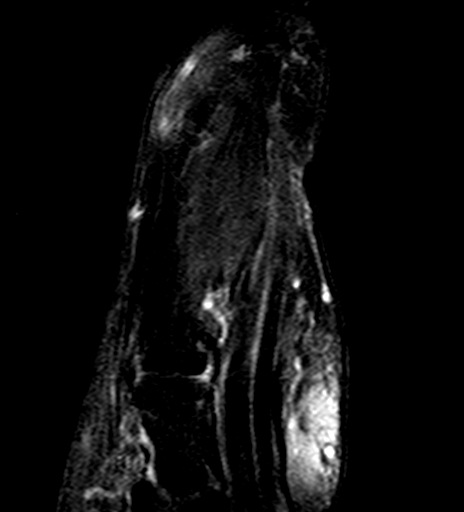
[im 8/16]
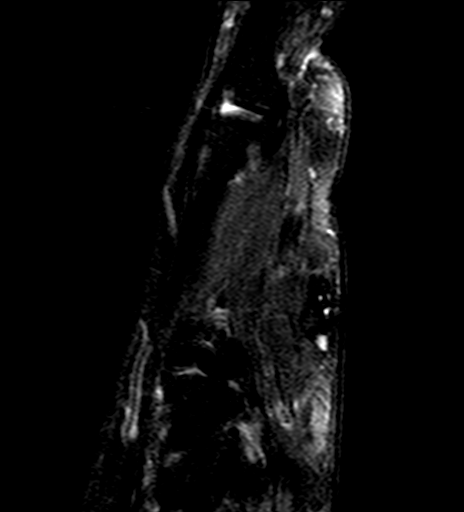
[im 12/16]
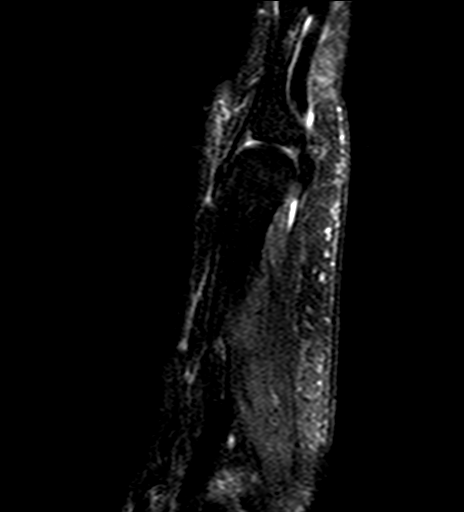
[im 16/16]
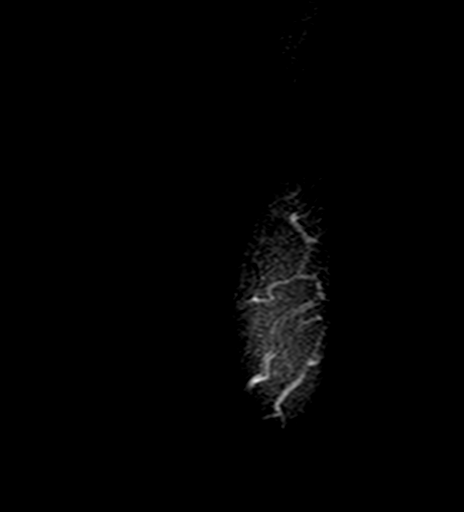

[24 of 40 positions shown; findings below may reference images not displayed]

MRI REPORT

DIAGNOSTIC STUDIES

EXAM

Left finger MRI.

INDICATION

ongoing hand pain at base of CMC and carpals
PAIN TO THE LEFT HAD SNUFF BOX REGION, PAIN FROM THE BASE OF THE POSTERIOR
1ST DIGIT TO THE WRIST. PT STATES SHE HAS A NODULE TO THE SNUX BOX REGION
MARKED WITH E TAB. I COULD NOT FEEL NODULE.

TECHNIQUE

MRI of the left hand/wrist region.

COMPARISONS

Left wrist radiographs 19 August, 2017.

FINDINGS

Marrow/joint space: There is moderate to severe osteoarthropathy of the 1st carpometacarpal
articulation with some fragmentation noted. There is a small joint effusion. Reactive marrow edema
is present. There is no compelling evidence of acute fracture, though the osteoarthropathy does
complicates this interpretation to a certain degree. There is no evidence of disruption of the 1st
MCP joint capsule/ligaments, though 1st MCP osteoarthropathy is also present which is overall mild.
There is mild triscaphe osteoarthropathy. No aggressive osseous lesion.

TFCC: The included triangular fibrocartilage complex is grossly unremarkable without compelling
evidence of tearing of the articular disc.

Tendons: The flexor and extensor tendons of the wrist are intact without tear, tendinopathy, or
tenosynovitis. The musculature of the hand and wrist is also unremarkable though there is mild
subcutaneous edema at the palmar aspect of the thenar eminence.

Ligaments: The scapholunate and lunotriquetral ligaments are grossly intact.

IMPRESSION

Advanced osteoarthropathy of the 1st carpometacarpal joint with associated joint effusion,
marginal spurring, and reactive marrow edema. Other degenerative changes are also present but are
less pronounced.

## 2018-09-17 ENCOUNTER — Ambulatory Visit: Admit: 2018-09-17 | Discharge: 2018-09-18 | Payer: MEDICARE

## 2018-09-17 ENCOUNTER — Encounter: Admit: 2018-09-17 | Discharge: 2018-09-17 | Payer: MEDICARE

## 2018-09-17 DIAGNOSIS — F445 Conversion disorder with seizures or convulsions: ICD-10-CM

## 2018-09-17 DIAGNOSIS — M48061 Spinal stenosis, lumbar region without neurogenic claudication: ICD-10-CM

## 2018-09-17 DIAGNOSIS — J45909 Unspecified asthma, uncomplicated: ICD-10-CM

## 2018-09-17 DIAGNOSIS — K760 Fatty (change of) liver, not elsewhere classified: ICD-10-CM

## 2018-09-17 DIAGNOSIS — R32 Unspecified urinary incontinence: ICD-10-CM

## 2018-09-17 DIAGNOSIS — E785 Hyperlipidemia, unspecified: ICD-10-CM

## 2018-09-17 DIAGNOSIS — R29898 Other symptoms and signs involving the musculoskeletal system: ICD-10-CM

## 2018-09-17 DIAGNOSIS — G47419 Narcolepsy without cataplexy: ICD-10-CM

## 2018-09-17 DIAGNOSIS — M1990 Unspecified osteoarthritis, unspecified site: Principal | ICD-10-CM

## 2018-09-17 DIAGNOSIS — R4189 Other symptoms and signs involving cognitive functions and awareness: ICD-10-CM

## 2018-09-17 DIAGNOSIS — G4733 Obstructive sleep apnea (adult) (pediatric): ICD-10-CM

## 2018-09-17 DIAGNOSIS — R3989 Other symptoms and signs involving the genitourinary system: ICD-10-CM

## 2018-09-17 DIAGNOSIS — E039 Hypothyroidism, unspecified: ICD-10-CM

## 2018-09-17 DIAGNOSIS — J40 Bronchitis, not specified as acute or chronic: ICD-10-CM

## 2018-09-17 DIAGNOSIS — H547 Unspecified visual loss: ICD-10-CM

## 2018-09-17 DIAGNOSIS — R413 Other amnesia: ICD-10-CM

## 2018-09-17 DIAGNOSIS — G479 Sleep disorder, unspecified: ICD-10-CM

## 2018-09-17 DIAGNOSIS — E669 Obesity, unspecified: ICD-10-CM

## 2018-09-17 DIAGNOSIS — J309 Allergic rhinitis, unspecified: ICD-10-CM

## 2018-09-17 DIAGNOSIS — E119 Type 2 diabetes mellitus without complications: ICD-10-CM

## 2018-09-17 DIAGNOSIS — M4802 Spinal stenosis, cervical region: ICD-10-CM

## 2018-09-17 DIAGNOSIS — K219 Gastro-esophageal reflux disease without esophagitis: ICD-10-CM

## 2018-09-17 DIAGNOSIS — G629 Polyneuropathy, unspecified: ICD-10-CM

## 2018-09-18 DIAGNOSIS — F445 Conversion disorder with seizures or convulsions: ICD-10-CM

## 2018-09-18 DIAGNOSIS — G40909 Epilepsy, unspecified, not intractable, without status epilepticus: Principal | ICD-10-CM

## 2018-10-05 ENCOUNTER — Encounter: Admit: 2018-10-05 | Discharge: 2018-10-05 | Payer: MEDICARE

## 2018-10-05 ENCOUNTER — Ambulatory Visit: Admit: 2018-10-05 | Discharge: 2018-10-06 | Payer: MEDICARE

## 2018-10-06 DIAGNOSIS — F341 Dysthymic disorder: Principal | ICD-10-CM

## 2018-10-06 DIAGNOSIS — F445 Conversion disorder with seizures or convulsions: ICD-10-CM

## 2018-11-12 ENCOUNTER — Ambulatory Visit: Admit: 2018-11-12 | Discharge: 2018-11-12 | Payer: MEDICARE

## 2018-11-12 DIAGNOSIS — G40909 Epilepsy, unspecified, not intractable, without status epilepticus: ICD-10-CM

## 2018-11-12 DIAGNOSIS — F341 Dysthymic disorder: Principal | ICD-10-CM

## 2018-11-12 DIAGNOSIS — F3341 Major depressive disorder, recurrent, in partial remission: ICD-10-CM

## 2018-11-12 DIAGNOSIS — F411 Generalized anxiety disorder: ICD-10-CM

## 2018-11-12 DIAGNOSIS — F445 Conversion disorder with seizures or convulsions: ICD-10-CM

## 2018-11-12 DIAGNOSIS — F410 Panic disorder [episodic paroxysmal anxiety] without agoraphobia: ICD-10-CM

## 2018-11-12 MED ORDER — FLUOXETINE 20 MG PO CAP
60 mg | ORAL_CAPSULE | Freq: Every day | ORAL | 2 refills | Status: AC
Start: 2018-11-12 — End: 2019-03-10

## 2018-11-12 MED ORDER — HYDROXYZINE HCL 10 MG PO TAB
10 mg | ORAL_TABLET | Freq: Two times a day (BID) | ORAL | 2 refills | 30.00000 days | Status: AC | PRN
Start: 2018-11-12 — End: 2019-03-22

## 2018-11-13 ENCOUNTER — Encounter: Admit: 2018-11-13 | Discharge: 2018-11-13 | Payer: MEDICARE

## 2018-11-13 DIAGNOSIS — M48061 Spinal stenosis, lumbar region without neurogenic claudication: ICD-10-CM

## 2018-11-13 DIAGNOSIS — K760 Fatty (change of) liver, not elsewhere classified: ICD-10-CM

## 2018-11-13 DIAGNOSIS — M4802 Spinal stenosis, cervical region: ICD-10-CM

## 2018-11-13 DIAGNOSIS — R413 Other amnesia: ICD-10-CM

## 2018-11-13 DIAGNOSIS — J309 Allergic rhinitis, unspecified: ICD-10-CM

## 2018-11-13 DIAGNOSIS — E119 Type 2 diabetes mellitus without complications: ICD-10-CM

## 2018-11-13 DIAGNOSIS — G4733 Obstructive sleep apnea (adult) (pediatric): ICD-10-CM

## 2018-11-13 DIAGNOSIS — E669 Obesity, unspecified: ICD-10-CM

## 2018-11-13 DIAGNOSIS — F445 Conversion disorder with seizures or convulsions: ICD-10-CM

## 2018-11-13 DIAGNOSIS — G479 Sleep disorder, unspecified: ICD-10-CM

## 2018-11-13 DIAGNOSIS — G47419 Narcolepsy without cataplexy: ICD-10-CM

## 2018-11-13 DIAGNOSIS — J40 Bronchitis, not specified as acute or chronic: ICD-10-CM

## 2018-11-13 DIAGNOSIS — M1990 Unspecified osteoarthritis, unspecified site: Principal | ICD-10-CM

## 2018-11-13 DIAGNOSIS — R3989 Other symptoms and signs involving the genitourinary system: ICD-10-CM

## 2018-11-13 DIAGNOSIS — E785 Hyperlipidemia, unspecified: ICD-10-CM

## 2018-11-13 DIAGNOSIS — R32 Unspecified urinary incontinence: ICD-10-CM

## 2018-11-13 DIAGNOSIS — R4189 Other symptoms and signs involving cognitive functions and awareness: ICD-10-CM

## 2018-11-13 DIAGNOSIS — H547 Unspecified visual loss: ICD-10-CM

## 2018-11-13 DIAGNOSIS — R29898 Other symptoms and signs involving the musculoskeletal system: ICD-10-CM

## 2018-11-13 DIAGNOSIS — J45909 Unspecified asthma, uncomplicated: ICD-10-CM

## 2018-11-13 DIAGNOSIS — K219 Gastro-esophageal reflux disease without esophagitis: ICD-10-CM

## 2018-11-13 DIAGNOSIS — E039 Hypothyroidism, unspecified: ICD-10-CM

## 2018-11-13 DIAGNOSIS — G629 Polyneuropathy, unspecified: ICD-10-CM

## 2018-11-25 ENCOUNTER — Emergency Department: Admit: 2018-11-25 | Discharge: 2018-11-26 | Disposition: A | Discharge status: Home / Self Care | Payer: MEDICARE

## 2018-11-25 ENCOUNTER — Encounter: Admit: 2018-11-25 | Discharge: 2018-11-25 | Payer: MEDICARE

## 2018-11-25 DIAGNOSIS — G479 Sleep disorder, unspecified: ICD-10-CM

## 2018-11-25 DIAGNOSIS — J40 Bronchitis, not specified as acute or chronic: ICD-10-CM

## 2018-11-25 DIAGNOSIS — E039 Hypothyroidism, unspecified: ICD-10-CM

## 2018-11-25 DIAGNOSIS — H547 Unspecified visual loss: ICD-10-CM

## 2018-11-25 DIAGNOSIS — R4189 Other symptoms and signs involving cognitive functions and awareness: ICD-10-CM

## 2018-11-25 DIAGNOSIS — M48061 Spinal stenosis, lumbar region without neurogenic claudication: ICD-10-CM

## 2018-11-25 DIAGNOSIS — E119 Type 2 diabetes mellitus without complications: ICD-10-CM

## 2018-11-25 DIAGNOSIS — M1990 Unspecified osteoarthritis, unspecified site: Principal | ICD-10-CM

## 2018-11-25 DIAGNOSIS — G4733 Obstructive sleep apnea (adult) (pediatric): ICD-10-CM

## 2018-11-25 DIAGNOSIS — E669 Obesity, unspecified: ICD-10-CM

## 2018-11-25 DIAGNOSIS — K760 Fatty (change of) liver, not elsewhere classified: ICD-10-CM

## 2018-11-25 DIAGNOSIS — J45909 Unspecified asthma, uncomplicated: ICD-10-CM

## 2018-11-25 DIAGNOSIS — K219 Gastro-esophageal reflux disease without esophagitis: ICD-10-CM

## 2018-11-25 DIAGNOSIS — F445 Conversion disorder with seizures or convulsions: ICD-10-CM

## 2018-11-25 DIAGNOSIS — R413 Other amnesia: ICD-10-CM

## 2018-11-25 DIAGNOSIS — R3989 Other symptoms and signs involving the genitourinary system: ICD-10-CM

## 2018-11-25 DIAGNOSIS — R29898 Other symptoms and signs involving the musculoskeletal system: ICD-10-CM

## 2018-11-25 DIAGNOSIS — G629 Polyneuropathy, unspecified: ICD-10-CM

## 2018-11-25 DIAGNOSIS — G47419 Narcolepsy without cataplexy: ICD-10-CM

## 2018-11-25 DIAGNOSIS — M4802 Spinal stenosis, cervical region: ICD-10-CM

## 2018-11-25 DIAGNOSIS — R32 Unspecified urinary incontinence: ICD-10-CM

## 2018-11-25 DIAGNOSIS — E785 Hyperlipidemia, unspecified: ICD-10-CM

## 2018-11-25 DIAGNOSIS — J309 Allergic rhinitis, unspecified: ICD-10-CM

## 2018-11-25 LAB — COMPREHENSIVE METABOLIC PANEL: Lab: 137 MMOL/L (ref 137–147)

## 2018-11-25 LAB — POC GLUCOSE: Lab: 144 mg/dL — ABNORMAL HIGH (ref 70–100)

## 2018-11-25 LAB — POC LACTATE: Lab: 1.2 MMOL/L (ref 0.5–2.0)

## 2018-11-25 LAB — CBC AND DIFF: Lab: 8.9 10*3/uL (ref 4.5–11.0)

## 2018-11-25 LAB — POC TROPONIN: Lab: 0 ng/mL (ref 0.00–0.05)

## 2018-11-26 ENCOUNTER — Encounter: Admit: 2018-11-26 | Discharge: 2018-11-26 | Payer: MEDICARE

## 2018-11-26 DIAGNOSIS — R404 Transient alteration of awareness: Principal | ICD-10-CM

## 2018-11-26 DIAGNOSIS — G40909 Epilepsy, unspecified, not intractable, without status epilepticus: Principal | ICD-10-CM

## 2018-11-26 LAB — URINALYSIS DIPSTICK

## 2018-11-26 LAB — URINALYSIS, MICROSCOPIC

## 2018-11-27 ENCOUNTER — Encounter: Admit: 2018-11-27 | Discharge: 2018-11-27 | Payer: MEDICARE

## 2018-12-09 ENCOUNTER — Encounter: Admit: 2018-12-09 | Discharge: 2018-12-09 | Payer: MEDICARE

## 2019-02-17 ENCOUNTER — Encounter: Admit: 2019-02-17 | Discharge: 2019-02-17 | Payer: MEDICARE

## 2019-03-10 ENCOUNTER — Encounter: Admit: 2019-03-10 | Discharge: 2019-03-10 | Payer: MEDICARE

## 2019-03-10 MED ORDER — FLUOXETINE 20 MG PO CAP
ORAL_CAPSULE | Freq: Every day | 0 refills | Status: AC
Start: 2019-03-10 — End: 2019-03-22

## 2019-03-10 NOTE — Telephone Encounter
LOV 11/12/18. Upcoming appointment 03/22/19. Refill request completed per protocol.

## 2019-03-22 ENCOUNTER — Encounter: Admit: 2019-03-22 | Discharge: 2019-03-22 | Payer: MEDICARE

## 2019-03-22 MED ORDER — HYDROXYZINE HCL 10 MG PO TAB
10 mg | ORAL_TABLET | Freq: Two times a day (BID) | ORAL | 2 refills | 30.00000 days | Status: AC | PRN
Start: 2019-03-22 — End: 2020-02-01

## 2019-03-22 MED ORDER — FLUOXETINE 40 MG PO CAP
80 mg | ORAL_CAPSULE | Freq: Every day | ORAL | 1 refills | Status: AC
Start: 2019-03-22 — End: 2019-10-23

## 2019-03-22 NOTE — Telephone Encounter
-Tobacco: denies  -Alcohol: denies  -Other: denies  ???    Review of Systems   Constitutional: Negative for chills, fatigue and fever.   Cardiovascular: Negative for chest pain.   Musculoskeletal: Positive for hip, back and arm pain.  Neurological: Positive for dizziness, syncope and headaches. Negative for tremors and light-headedness.   Psychiatric/Behavioral: Positive for sleep disturbance. Negative for dysphoric mood, hallucinations and suicidal ideas. The patient is nervous/anxious.          Objective:         ??? acetaminophen (TYLENOL) 325 mg tablet Take 2 Tabs by mouth every 4 hours as needed. (Patient taking differently: Take 650 mg by mouth as Needed.)   ??? aspirin 81 mg chewable tablet Chew 81 mg by mouth daily.   ??? CALCIUM CARBONATE (CALTRATE 600 PO) Take 1 Tab by mouth twice daily.   ??? celecoxib (CELEBREX) 100 mg capsule Take 100 mg by mouth twice daily.   ??? ergocalciferol (VITAMIN D-2) 50,000 unit capsule Take 50,000 Units by mouth every 7 days.   ??? fenofibrate nanocrystallized (TRICOR) 145 mg tablet Take 145 mg by mouth at bedtime daily.   ??? fexofenadine(+) (ALLEGRA) 180 mg tablet Take 180 mg by mouth at bedtime daily.   ??? fish oil- omega 3-DHA/EPA 300/1,000 mg capsule Take 1 Cap by mouth at bedtime daily.   ??? FLUoxetine (PROZAC) 20 mg capsule TAKE 3 CAPSULES BY MOUTH ONCE DAILY   ??? furosemide (LASIX) 40 mg tablet Take 40 mg by mouth as Needed.   ??? hydrOXYzine (ATARAX) 10 mg tablet Take one tablet by mouth twice daily as needed for Anxiety.   ??? insulin aspart (NOVOLOG) 100 unit/mL injection Take 25 units with breakfast, 22 units with lunch, and 30 units with dinner.  Indications: TYPE 2 DIABETES MELLITUS (Patient taking differently: Take 25 units with breakfast, 22 units with lunch, and 30 units with dinner. Sliding scale)   ??? Insulin Needles (Disposable) (NANO PEN NEEDLE) 32 gauge x 5/32 ndle Use 1 Each as directed before meals and at bedtime. ??? Insulin NPH (HUMULIN N KWIKPEN) 100 unit/mL (3 mL) injection PEN Take 25 units in AM and 42 units at bedtime. (Patient taking differently: Take 50 units at bedtime.)   ??? levothyroxine (SYNTHROID) 88 mcg tablet Take 88 mcg by mouth daily.   ??? metFORMIN-XR(+) (GLUCOPHAGE XR) 500 mg extended release tablet Take 500 mg by mouth twice daily.   ??? montelukast (SINGULAIR) 10 mg tablet Take 10 mg by mouth at bedtime daily.   ??? MULTIVITAMINS WITH FLUORIDE (MULTI-VITAMIN PO) Take 1 Tab by mouth at bedtime daily.   ??? oxybutynin XL (DITROPAN XL) 10 mg tablet Take 1 Tab by mouth daily. Do not cut/ crush/ chew   ??? pantoprazole DR (PROTONIX) 40 mg tablet Take 40 mg by mouth twice daily.   ??? potassium chloride SR (K-DUR) 20 mEq tablet Take 20 mEq by mouth as Needed. With lasix   ??? Pramipexole (MIRAPEX) 0.75 mg tab Take 0.75 mg by mouth twice daily.   ??? senna/docusate (SENOKOT-S) 8.6/50 mg tablet Take 1 Tab by mouth twice daily. (Patient taking differently: Take 1 tablet by mouth twice daily as needed.)   ??? simvastatin (ZOCOR) 40 mg tablet Take 40 mg by mouth at bedtime daily.   ??? sinus rinse pkdv kit Insert 1 Packet into nose as directed as Needed.   ??? vitamin E 400 unit capsule Take 400 mg by mouth daily.     Vitals:    11/12/18 1426  BP: 148/71   Pulse: 82   Weight: 103.4 kg (228 lb)   Height: 172.7 cm (68)     There is no height or weight on file to calculate BMI.     Physical Exam   Psychiatric:   MENTAL STATUS EXAMINATION  General/Constitutional: 68 yo female, does not sound distressed  Behavior: Calm, cooperative; appropriate for conversation  Speech: RRR with normal volume and tone. Good articulation  Mood: ups and downs  Affect: euthymic ; mood congruent  Thought Process: Linear and goal directed  Thought Content: denies SI, HI. No evidence of delusions  Perception: Denies AVH  Associations: Intact  Insight/Judgment: fair/fair    Physical Exam:  Gait: Ambulates without difficulty. Normal arm swing The proposed treatment plan was discussed with the patient/guardian who was provided the opportunity to ask questions and make suggestions regarding alternative treatment.

## 2019-04-01 ENCOUNTER — Ambulatory Visit: Admit: 2019-04-01 | Discharge: 2019-04-02 | Payer: MEDICARE

## 2019-04-01 ENCOUNTER — Encounter: Admit: 2019-04-01 | Discharge: 2019-04-01 | Payer: MEDICARE

## 2019-04-01 NOTE — Progress Notes
Telephone call was requested due to COVID pandemic and as patient could not figure out how to connect via Zoom and and wanted emotional support.    START TIME: 3:20 PM  STOP TIME: 3:50  PM    Pt. Says she has multiple stressors and has had two falls as well, one in November (previously reported), and one in March.  Now in walker.  Worried about getting COVID.  Concerned that husband is working and that he is not well enough to do so.  Feels overwhelmed with tasks at home and gets no help.  Pt reports passive SI but does not intend to kill herself can't as I have grandchildren, am a mother and have a husband.   Provided brief emotional support and provided assistance to set up teleheath through Zoom so that we can conduct a psychotherapy visit.   ???

## 2019-04-02 DIAGNOSIS — F445 Conversion disorder with seizures or convulsions: ICD-10-CM

## 2019-04-02 DIAGNOSIS — F341 Dysthymic disorder: Principal | ICD-10-CM

## 2019-05-25 ENCOUNTER — Encounter: Admit: 2019-05-25 | Discharge: 2019-05-25

## 2019-05-25 DIAGNOSIS — G40909 Epilepsy, unspecified, not intractable, without status epilepticus: Secondary | ICD-10-CM

## 2019-06-01 ENCOUNTER — Encounter: Admit: 2019-06-01 | Discharge: 2019-06-01

## 2019-06-01 ENCOUNTER — Ambulatory Visit: Admit: 2019-06-01 | Discharge: 2019-06-02

## 2019-06-01 DIAGNOSIS — F3341 Major depressive disorder, recurrent, in partial remission: Secondary | ICD-10-CM

## 2019-06-01 DIAGNOSIS — F445 Conversion disorder with seizures or convulsions: Secondary | ICD-10-CM

## 2019-06-01 DIAGNOSIS — F341 Dysthymic disorder: Secondary | ICD-10-CM

## 2019-06-01 NOTE — Progress Notes
Patient verbally consented to a telehealth visit.

## 2019-06-02 ENCOUNTER — Encounter: Admit: 2019-06-02 | Discharge: 2019-06-02

## 2019-06-02 NOTE — Progress Notes
Obtained patient's verbal consent to treat them and their agreement to [place] and NPP via this telehealth visit during the [facility]    4:05-4:35 PM. Spoke with patient by phone as she is not able to access a Zoom.  Evaluated her functioning.  She has some depressed moods that are intense, but she does not have suicidal intentions.  She has passive thoughts at those times of not seeing a point in going on.  She feels she is burdened by family who expect her to do all of the work.  She was otherwise positive in the meeting.  Provided support. Not having seizures recently.  Patient should follow up in clinic when open.

## 2019-06-18 ENCOUNTER — Encounter: Admit: 2019-06-18 | Discharge: 2019-06-18

## 2019-06-21 ENCOUNTER — Encounter: Admit: 2019-06-21 | Discharge: 2019-06-21

## 2019-06-21 ENCOUNTER — Ambulatory Visit: Admit: 2019-06-21 | Discharge: 2019-06-22

## 2019-06-21 DIAGNOSIS — H1013 Acute atopic conjunctivitis, bilateral: Principal | ICD-10-CM

## 2019-06-21 DIAGNOSIS — G629 Polyneuropathy, unspecified: Secondary | ICD-10-CM

## 2019-06-21 DIAGNOSIS — E119 Type 2 diabetes mellitus without complications: Secondary | ICD-10-CM

## 2019-06-21 DIAGNOSIS — J45909 Unspecified asthma, uncomplicated: Secondary | ICD-10-CM

## 2019-06-21 DIAGNOSIS — R4189 Other symptoms and signs involving cognitive functions and awareness: Secondary | ICD-10-CM

## 2019-06-21 DIAGNOSIS — G4733 Obstructive sleep apnea (adult) (pediatric): Secondary | ICD-10-CM

## 2019-06-21 DIAGNOSIS — F445 Conversion disorder with seizures or convulsions: Secondary | ICD-10-CM

## 2019-06-21 DIAGNOSIS — K760 Fatty (change of) liver, not elsewhere classified: Secondary | ICD-10-CM

## 2019-06-21 DIAGNOSIS — E669 Obesity, unspecified: Secondary | ICD-10-CM

## 2019-06-21 DIAGNOSIS — R3989 Other symptoms and signs involving the genitourinary system: Secondary | ICD-10-CM

## 2019-06-21 DIAGNOSIS — R29898 Other symptoms and signs involving the musculoskeletal system: Secondary | ICD-10-CM

## 2019-06-21 DIAGNOSIS — J40 Bronchitis, not specified as acute or chronic: Secondary | ICD-10-CM

## 2019-06-21 DIAGNOSIS — J309 Allergic rhinitis, unspecified: Secondary | ICD-10-CM

## 2019-06-21 DIAGNOSIS — M4802 Spinal stenosis, cervical region: Secondary | ICD-10-CM

## 2019-06-21 DIAGNOSIS — H547 Unspecified visual loss: Secondary | ICD-10-CM

## 2019-06-21 DIAGNOSIS — G47419 Narcolepsy without cataplexy: Secondary | ICD-10-CM

## 2019-06-21 DIAGNOSIS — E039 Hypothyroidism, unspecified: Secondary | ICD-10-CM

## 2019-06-21 DIAGNOSIS — E785 Hyperlipidemia, unspecified: Secondary | ICD-10-CM

## 2019-06-21 DIAGNOSIS — M1990 Unspecified osteoarthritis, unspecified site: Secondary | ICD-10-CM

## 2019-06-21 DIAGNOSIS — M48061 Spinal stenosis, lumbar region without neurogenic claudication: Secondary | ICD-10-CM

## 2019-06-21 DIAGNOSIS — R32 Unspecified urinary incontinence: Secondary | ICD-10-CM

## 2019-06-21 DIAGNOSIS — R413 Other amnesia: Secondary | ICD-10-CM

## 2019-06-21 DIAGNOSIS — G479 Sleep disorder, unspecified: Secondary | ICD-10-CM

## 2019-06-21 DIAGNOSIS — K219 Gastro-esophageal reflux disease without esophagitis: Secondary | ICD-10-CM

## 2019-06-21 MED ORDER — TOBRAMYCIN-DEXAMETHASONE 0.3-0.1 % OP DRPS
1 [drp] | Freq: Four times a day (QID) | OPHTHALMIC | 1 refills | 7.00000 days | Status: AC
Start: 2019-06-21 — End: ?

## 2019-06-21 NOTE — Progress Notes
There is no height or weight on file to calculate BMI.              Assessment and Plan:  A/ 1. Acute conjunctivitis   -suspect allergic in nature    P/ 1. Start tobradex QID, will start taper in 3-4 days. Patient to return in 1 week for f/u

## 2019-07-01 ENCOUNTER — Encounter: Admit: 2019-07-01 | Discharge: 2019-07-01

## 2019-07-01 DIAGNOSIS — J45909 Unspecified asthma, uncomplicated: Secondary | ICD-10-CM

## 2019-07-01 DIAGNOSIS — E669 Obesity, unspecified: Secondary | ICD-10-CM

## 2019-07-01 DIAGNOSIS — R32 Unspecified urinary incontinence: Secondary | ICD-10-CM

## 2019-07-01 DIAGNOSIS — H5213 Myopia, bilateral: Secondary | ICD-10-CM

## 2019-07-01 DIAGNOSIS — K219 Gastro-esophageal reflux disease without esophagitis: Secondary | ICD-10-CM

## 2019-07-01 DIAGNOSIS — R4189 Other symptoms and signs involving cognitive functions and awareness: Secondary | ICD-10-CM

## 2019-07-01 DIAGNOSIS — H5052 Exophoria: Secondary | ICD-10-CM

## 2019-07-01 DIAGNOSIS — M48061 Spinal stenosis, lumbar region without neurogenic claudication: Secondary | ICD-10-CM

## 2019-07-01 DIAGNOSIS — E785 Hyperlipidemia, unspecified: Secondary | ICD-10-CM

## 2019-07-01 DIAGNOSIS — H1013 Acute atopic conjunctivitis, bilateral: Secondary | ICD-10-CM

## 2019-07-01 DIAGNOSIS — R413 Other amnesia: Secondary | ICD-10-CM

## 2019-07-01 DIAGNOSIS — J309 Allergic rhinitis, unspecified: Secondary | ICD-10-CM

## 2019-07-01 DIAGNOSIS — E119 Type 2 diabetes mellitus without complications: Secondary | ICD-10-CM

## 2019-07-01 DIAGNOSIS — G47419 Narcolepsy without cataplexy: Secondary | ICD-10-CM

## 2019-07-01 DIAGNOSIS — H547 Unspecified visual loss: Secondary | ICD-10-CM

## 2019-07-01 DIAGNOSIS — F445 Conversion disorder with seizures or convulsions: Secondary | ICD-10-CM

## 2019-07-01 DIAGNOSIS — G4733 Obstructive sleep apnea (adult) (pediatric): Secondary | ICD-10-CM

## 2019-07-01 DIAGNOSIS — M4802 Spinal stenosis, cervical region: Secondary | ICD-10-CM

## 2019-07-01 DIAGNOSIS — J40 Bronchitis, not specified as acute or chronic: Secondary | ICD-10-CM

## 2019-07-01 DIAGNOSIS — R3989 Other symptoms and signs involving the genitourinary system: Secondary | ICD-10-CM

## 2019-07-01 DIAGNOSIS — R29898 Other symptoms and signs involving the musculoskeletal system: Secondary | ICD-10-CM

## 2019-07-01 DIAGNOSIS — G479 Sleep disorder, unspecified: Secondary | ICD-10-CM

## 2019-07-01 DIAGNOSIS — H25813 Combined forms of age-related cataract, bilateral: Secondary | ICD-10-CM

## 2019-07-01 DIAGNOSIS — E039 Hypothyroidism, unspecified: Secondary | ICD-10-CM

## 2019-07-01 DIAGNOSIS — G629 Polyneuropathy, unspecified: Secondary | ICD-10-CM

## 2019-07-01 DIAGNOSIS — K760 Fatty (change of) liver, not elsewhere classified: Secondary | ICD-10-CM

## 2019-07-01 DIAGNOSIS — M1990 Unspecified osteoarthritis, unspecified site: Secondary | ICD-10-CM

## 2019-07-01 NOTE — Progress Notes
There is no height or weight on file to calculate BMI.              Assessment and Plan:  A/ 1. 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 Dermatochalasis OU   -not visually significant OU      4. Exophoria OU      5. Convergence insufficiency OU      6. Acute allergic conjunctivitis OU   -resolved      7. Myopic astigmatism with presbyopia OU    P/ 1. Reviewed with patient need for tight control of diabetes to help maintain ocular health. Educated on reduced risk of retinopathy if maintains A1c under 7.0. Monitor with dilated examination in 1 year.       2-3. Monitor for visual change with examination in 1 year. Discussed potential need for cataract surgery in future. Consider referral if worsening vision or increasing symptoms at next examination.       4-5. Rx written with prism        6. D/C tobradex        7. Prescription for glasses provided to patient

## 2019-07-02 ENCOUNTER — Ambulatory Visit: Admit: 2019-07-01 | Discharge: 2019-07-02

## 2019-07-02 DIAGNOSIS — H02835 Dermatochalasis of left lower eyelid: Secondary | ICD-10-CM

## 2019-07-02 DIAGNOSIS — H524 Presbyopia: Secondary | ICD-10-CM

## 2019-07-02 DIAGNOSIS — H02834 Dermatochalasis of left upper eyelid: Secondary | ICD-10-CM

## 2019-07-02 DIAGNOSIS — H02831 Dermatochalasis of right upper eyelid: Secondary | ICD-10-CM

## 2019-07-02 DIAGNOSIS — H52203 Unspecified astigmatism, bilateral: Secondary | ICD-10-CM

## 2019-07-02 DIAGNOSIS — H5111 Convergence insufficiency: Secondary | ICD-10-CM

## 2019-07-02 DIAGNOSIS — H02832 Dermatochalasis of right lower eyelid: Secondary | ICD-10-CM

## 2019-07-02 DIAGNOSIS — E119 Type 2 diabetes mellitus without complications: Principal | ICD-10-CM

## 2019-07-23 ENCOUNTER — Encounter: Admit: 2019-07-23 | Discharge: 2019-07-23

## 2019-08-02 ENCOUNTER — Encounter: Admit: 2019-08-02 | Discharge: 2019-08-02

## 2019-08-02 ENCOUNTER — Ambulatory Visit: Admit: 2019-08-02 | Discharge: 2019-08-03

## 2019-08-02 DIAGNOSIS — E039 Hypothyroidism, unspecified: Secondary | ICD-10-CM

## 2019-08-02 DIAGNOSIS — M4802 Spinal stenosis, cervical region: Secondary | ICD-10-CM

## 2019-08-02 DIAGNOSIS — J45909 Unspecified asthma, uncomplicated: Secondary | ICD-10-CM

## 2019-08-02 DIAGNOSIS — G47419 Narcolepsy without cataplexy: Secondary | ICD-10-CM

## 2019-08-02 DIAGNOSIS — J329 Chronic sinusitis, unspecified: Secondary | ICD-10-CM

## 2019-08-02 DIAGNOSIS — K760 Fatty (change of) liver, not elsewhere classified: Secondary | ICD-10-CM

## 2019-08-02 DIAGNOSIS — E669 Obesity, unspecified: Secondary | ICD-10-CM

## 2019-08-02 DIAGNOSIS — F445 Conversion disorder with seizures or convulsions: Secondary | ICD-10-CM

## 2019-08-02 DIAGNOSIS — J309 Allergic rhinitis, unspecified: Secondary | ICD-10-CM

## 2019-08-02 DIAGNOSIS — R4189 Other symptoms and signs involving cognitive functions and awareness: Secondary | ICD-10-CM

## 2019-08-02 DIAGNOSIS — R32 Unspecified urinary incontinence: Secondary | ICD-10-CM

## 2019-08-02 DIAGNOSIS — E119 Type 2 diabetes mellitus without complications: Secondary | ICD-10-CM

## 2019-08-02 DIAGNOSIS — R3989 Other symptoms and signs involving the genitourinary system: Secondary | ICD-10-CM

## 2019-08-02 DIAGNOSIS — E785 Hyperlipidemia, unspecified: Secondary | ICD-10-CM

## 2019-08-02 DIAGNOSIS — J40 Bronchitis, not specified as acute or chronic: Secondary | ICD-10-CM

## 2019-08-02 DIAGNOSIS — H547 Unspecified visual loss: Secondary | ICD-10-CM

## 2019-08-02 DIAGNOSIS — R413 Other amnesia: Secondary | ICD-10-CM

## 2019-08-02 DIAGNOSIS — G629 Polyneuropathy, unspecified: Secondary | ICD-10-CM

## 2019-08-02 DIAGNOSIS — J3489 Other specified disorders of nose and nasal sinuses: Secondary | ICD-10-CM

## 2019-08-02 DIAGNOSIS — M1990 Unspecified osteoarthritis, unspecified site: Secondary | ICD-10-CM

## 2019-08-02 DIAGNOSIS — R29898 Other symptoms and signs involving the musculoskeletal system: Secondary | ICD-10-CM

## 2019-08-02 DIAGNOSIS — K219 Gastro-esophageal reflux disease without esophagitis: Secondary | ICD-10-CM

## 2019-08-02 DIAGNOSIS — M48061 Spinal stenosis, lumbar region without neurogenic claudication: Secondary | ICD-10-CM

## 2019-08-02 DIAGNOSIS — G479 Sleep disorder, unspecified: Secondary | ICD-10-CM

## 2019-08-02 DIAGNOSIS — G4733 Obstructive sleep apnea (adult) (pediatric): Secondary | ICD-10-CM

## 2019-08-02 MED ORDER — MUPIROCIN 2 % TP OINT
Freq: Two times a day (BID) | TOPICAL | 3 refills | 11.00000 days | Status: AC
Start: 2019-08-02 — End: ?

## 2019-08-02 NOTE — Telephone Encounter
Request medication to be sent to [facility], call pharmacy at [phone].

## 2019-08-02 NOTE — Progress Notes
Date of Service: 08/02/2019    Subjective:             Hailey Rodgers is a 68 y.o. female.    History of Present Illness    States lots of nasal symptoms  All the time for years  States lots of snot in the nose  + PND   States constant sinus pain and pressure    History of septal surgery--Dr. Nedra Hai in the 1980's, then surgery in the 2000's  Remembers had a lot of bleeding after the first surgery    Currently irrigates nose frequently  States does frequently get water in the ears and can't hear    States always with scabbing in the nose    No history of smoking  No history of nasal use of medications/drugs    States occasionally with bleeding    + lots of nose blowing  + bipap at night, with humidity    Thinks bugs in the ear  6 weeks ago had one go in the left ear, yesterday 1 went into right ear.         Review of Systems   Constitutional: Negative.    HENT: Positive for postnasal drip, sinus pressure, sinus pain and trouble swallowing.         Dry nose, scabes on inside, sore to touch   Eyes: Negative.    Respiratory: Negative.    Cardiovascular: Negative.    Gastrointestinal: Negative.    Endocrine: Negative.    Genitourinary: Negative.    Musculoskeletal: Negative.    Skin: Negative.    Allergic/Immunologic: Negative.    Neurological: Negative.    Hematological: Negative.    Psychiatric/Behavioral: Negative.          Objective:         ??? acetaminophen (TYLENOL) 325 mg tablet Take 2 Tabs by mouth every 4 hours as needed. (Patient taking differently: Take 650 mg by mouth as Needed.)   ??? aspirin 81 mg chewable tablet Chew 81 mg by mouth daily.   ??? CALCIUM CARBONATE (CALTRATE 600 PO) Take 1 Tab by mouth twice daily.   ??? celecoxib (CELEBREX) 100 mg capsule Take 100 mg by mouth twice daily.   ??? ergocalciferol (VITAMIN D-2) 50,000 unit capsule Take 50,000 Units by mouth every 7 days.   ??? fenofibrate nanocrystallized (TRICOR) 145 mg tablet Take 145 mg by mouth at bedtime daily. ??? fexofenadine(+) (ALLEGRA) 180 mg tablet Take 180 mg by mouth at bedtime daily.   ??? fish oil- omega 3-DHA/EPA 300/1,000 mg capsule Take 1 Cap by mouth at bedtime daily.   ??? FLUoxetine (PROZAC) 40 mg capsule Take two capsules by mouth daily. Indications: anxiousness associated with depression   ??? furosemide (LASIX) 40 mg tablet Take 40 mg by mouth as Needed.   ??? hydrOXYzine (ATARAX) 10 mg tablet Take one tablet by mouth twice daily as needed for Anxiety.   ??? insulin aspart (NOVOLOG) 100 unit/mL injection Take 25 units with breakfast, 22 units with lunch, and 30 units with dinner.  Indications: TYPE 2 DIABETES MELLITUS (Patient taking differently: Take 25 units with breakfast, 22 units with lunch, and 30 units with dinner. Sliding scale)   ??? Insulin Needles (Disposable) (NANO PEN NEEDLE) 32 gauge x 5/32 ndle Use 1 Each as directed before meals and at bedtime.   ??? Insulin NPH (HUMULIN N KWIKPEN) 100 unit/mL (3 mL) injection PEN Take 25 units in AM and 42 units at bedtime. (Patient taking differently: Take 50 units  at bedtime.)   ??? levothyroxine (SYNTHROID) 88 mcg tablet Take 88 mcg by mouth daily.   ??? metFORMIN-XR(+) (GLUCOPHAGE XR) 500 mg extended release tablet Take 500 mg by mouth twice daily.   ??? montelukast (SINGULAIR) 10 mg tablet Take 10 mg by mouth at bedtime daily.   ??? MULTIVITAMINS WITH FLUORIDE (MULTI-VITAMIN PO) Take 1 Tab by mouth at bedtime daily.   ??? oxybutynin XL (DITROPAN XL) 10 mg tablet Take 1 Tab by mouth daily. Do not cut/ crush/ chew   ??? pantoprazole DR (PROTONIX) 40 mg tablet Take 40 mg by mouth twice daily.   ??? potassium chloride SR (K-DUR) 20 mEq tablet Take 20 mEq by mouth as Needed. With lasix   ??? Pramipexole (MIRAPEX) 0.75 mg tab Take 0.75 mg by mouth twice daily.   ??? senna/docusate (SENOKOT-S) 8.6/50 mg tablet Take 1 Tab by mouth twice daily. (Patient taking differently: Take 1 tablet by mouth twice daily as needed.) ??? simvastatin (ZOCOR) 40 mg tablet Take 40 mg by mouth at bedtime daily.   ??? sinus rinse pkdv kit Insert 1 Packet into nose as directed as Needed.   ??? tobramycin 0.3%/dexamethasone 0.1% (TOBRADEX) 0.3/0.1 % ophthalmic suspension Apply one drop to both eyes four times daily.   ??? vitamin E 400 unit capsule Take 400 mg by mouth daily.     Vitals:    08/02/19 1323   BP: 121/73   Pulse: 71   Temp: 36.8 ???C (98.2 ???F)   Weight: 100.5 kg (221 lb 9.6 oz)   Height: 170.2 cm (67)   PainSc: Six     Body mass index is 34.71 kg/m???.     Physical Exam    General:  Well-developed, well-nourished  Communication and Voice:  Clear pitch and clarity  Hearing: Hearing adequate for verbal communication bilaterally   Inspection:  Normocephalic and atraumatic without mass or lesion  Palpation:  Facial skeleton intact without bony stepoffs  Facial Strength:  Facial motility symmetric and full bilaterally  Pinna:  External ear intact and fully developed  External canal:  Canal is patent with intact skin  Tympanic Membrane:  Normal bilaterally  External nose:  No scar or anatomic deformity  Internal Nose:  Septum intact with mild S-shaped septal deviation with spur. + crusting on septum bilaterally  No edema, polyp, or rhinorrhea.  Oral cavity, Lips, Teeth, and Gums:  Mucosa and teeth intact and viable, No lesions, masses or ulcers  Oropharynx: No erythema or exudate, no masses or ulcerations, non-obstructive tonsils  Larynx:  Normal voice, no stridor or stertor.    Neck, Trachea, Lymphatics:  Midline trachea without mass or lesion, no lymphadenopathy  Thyroid:  No mass or nodularity  Eyes: No nystagmus with equal extraocular motion bilaterally  Neuro/Psych/Balance: Patient oriented and appropriate in interaction;  Appropriate mood and affect;  Gait is intact with no imbalance; Cranial nerves I-XII are intact  Respiratory effort:  Equal inspiration and expiration, no respiratory distress Peripheral Vascular:  Warm extremities with equal distal pulses    Nasal Endoscopy: Patient is anesthetized with 4% lidocaine and decongested with neosynephrine.  Rigid endoscopy is performed; the patient tolerated the procedure well.  There is a mild septal deviation with a spur.  There is bilateral inferior turbinate hypertrophy.  + crusting on septum bilaterally.  Culture taken today.  There is global boggy mucosal edema.  The sinonasal anatomy is normal.  The bilateral osteomeatal complexes are unobstructed and the sphenoethmoid recess and the face of sphenoid is normal  bilaterally.  There are no masses, no purulence.  The nasopharynx is normal.         Assessment and Plan:  Encounter Diagnoses   Name Primary?   ??? Chronic sinusitis, unspecified location Yes   ??? Nasal vestibulitis      Start mupirocin for the nasal crusting  Will get CT sinus due to the chronic sinonasal complaints  Start irrigations can add mupirocin to it. Discussed ways to prevent it from getting into eustachian tubes  Start nasal steroid sprays  rtc after CT

## 2019-08-02 NOTE — Telephone Encounter
Multiple phone calls to determine patient's preferred pharmacy.  Eventually determined patient must have meant [facility] versus our initial confusion of a pharmacy in [place].  Prescription for mupirocin oint for irrigation sent to [facility] and message left on voicemail.

## 2019-08-04 LAB — CULTURE-WOUND/TISSUE/FLUID(AEROBIC ONLY)W/SENSITIVITY

## 2019-08-06 ENCOUNTER — Encounter: Admit: 2019-08-06 | Discharge: 2019-08-06

## 2019-08-06 NOTE — Telephone Encounter
-----   Message from Romeo Rabon, MD sent at 08/05/2019  9:34 PM CDT -----  Please call with results.  bactroban should be effective

## 2019-08-06 NOTE — Telephone Encounter
Per Dr. Jodene Nam, "I do not think that she needs to wait on epidural injection.  this is a topical, superficial infection.  the physician should take standard precautions doing the procedure.  the patient should practice frequent hand washing and to not touch wound with dirty hands.  if the practitioner is nervous about the infection then he/she could wait 1 week for topical antibiotic to take effect.". Relayed information to patient. Pt verbalized understanding. Scheduled for FU appt with Dr. Jodene Nam. No other questions and concerns voiced.

## 2019-08-06 NOTE — Telephone Encounter
Called pt to let her know her culture result. Pt verbalized understanding.    She reports that her pain doctor had asked her to get clearance from Dr. Jodene Rodgers regarding having epidural since pt has an "active infection". Will relay information to Dr. Jodene Rodgers.

## 2019-08-12 ENCOUNTER — Encounter: Admit: 2019-08-12 | Discharge: 2019-08-12

## 2019-08-12 DIAGNOSIS — IMO0001 Spells: Secondary | ICD-10-CM

## 2019-08-12 NOTE — Progress Notes
AMBULATORY ELECTROENCEPHALOGRAM WITH VIDEO          PATIENT NAME: Hailey Rodgers  GENDER: Female  DATE OF BIRTH: 08/09/1951  STUDY NAME: [id number]  ORDERED: 72 Hour Ambulatory with Video  DURATION: 72 Hours with Video  STUDY START DATE/TIME: 07/26/2019 2:39PM  STUDY END DATE/TIME: 07/29/2019 2:51PM  BILLING HOURS: 72  READING PHYSICIAN: Jacobo Forest, MD.  REFERRING PHYSICIAN: Jacobo Forest, MD.  TECHNOLOGIST: Wyline Beady, R EEG T  VIDEO: Yes  EKG: Yes   AUDIO: Yes     MEDICATIONS:  Lyrica  Effexor  Hydroxyzine  Celebrex    Per [facility]    CLINICAL NOTES  This is a 72-hour video ambulatory EEG study that was recorded for 72 hours in duration. The study was recorded from August 3-6, 2020 being remotely monitored by a registered technologist to ensure integrity of the video and EEG for the entire duration of the recording. If needed the physician was contacted to intervene with the option to diagnose and treat the patient and alter or end the recording. The patient was educated on the procedure prior to starting the study. The patients head was measured and marked using the international 10/20 system, 23 channel digital bipolar EEG connections (over temporal over parasagittal montage).  Additional channels for EOG and EKG.  Recording was continuous and recorded in a bipolar montage that can be re-montaged.  Calibration and impedances were recorded in all channels at 10kohms. The EEG may be flagged at the direction of the patient using a push button. A Patient Daily Log??? sheet is provided to document patient daily activities as well as ???Patient Event Log??? sheet for any episodes in question.    HYPERVENTILATION  Hyperventilation was not performed for this study.     PHOTIC STIMULATION  Photic Stimulation was not performed for this study.     HISTORY  The patient is a 68 year old right handed female. The patient reports a 20 year history of seizures described as ???cessation of activity and falling asleep??? for minutes up to 5 hours. She reports episodes of blacking out and unresponsiveness. This study was ordered for evaluation.                SLEEP FEATURES  Stages 1, 2, 3, and REM sleep were observed. The patient had a couple of arousals over the night and slept for about 6-7 hours. Sleep variants like sleep spindles, vertex sharp waves and k-complexes were all noted during sleeping portions of the study.   Day 1 ???Sleep 03:25AM; Awake 09:35AM   Day 2 ???Sleep 03:05AM; Awake 10:23AM   Day 3 ???Sleep 03:22AM; Awake 10:33AM      SUMMARY  The study was recorded and remotely monitored by a registered technologist for 72 hours to ensure integrity of the video and EEG for the entire duration of the recording. The patient returned the Patient Log Sheets. Dominate background rhythm of 10 Hz with an average amplitude of 20 uV, predominately seen in the posterior regions was noted during waking hours. Background was reactive to eye movements, attenuated with opening and repopulated with closure. There were no apparent abnormalities or asymmetries noted by the scanning technologist. All and any possible abnormalities have been clipped for further review by the physician.     EVENTS  The patient did not document on her event log sheet but did report the 3 listed events below to the monitoring team;    Event #1 8/5@03 :00AM, patient did not push button, patient reported didn???t feel  right, like she was going to pass out, patient on camera, sitting up in bed from sleeping, No EEG changes noted.    Event #2 8/5 @ 3PM-3:30PM, patient pushed button, as she felt aura, then reported felt like she was leaving her body patient on camera sleeping in chair. No EEG changes noted.    Event #3 8/5 @ 8:00PM, patient reported to the monitoring team it feels like everything leaves me patient on camera sitting in chair, No EEG changes noted.      EKG  EKG was regular with a heart rate of 66 bpm with no arrhythmias noted. Impressions are by ABRET registered EEG technologist with [facility] and does not signify final interpretation, physician can and may over-ride these findings upon review. Scanned by Rosita Fire, R.EEG T      PHYSICAN CONCLUSION/IMPRESSION:    Posterior dominant waking rhythm is 9-10 hz moderate voltage alpha activity. This activity is reactive. As the patient becomes drowsy there is a drop out of the background rhythm and sleep is evidenced by sleep spindles and K-complexes.     Event #1 was recorded around 03:00 AM on 07/28/2019. Patient is seen sitting in her chair drinking a beverage. She reported that she felt like she was going to ???pass out??? on the activity log.  No EEG changes were noted.     Event #2 was recorded on 8/5 at 3:00 PM. Patient reports that she felt like she was leaving her body. No EEG or obvious clinical change. EEG showed sleep alternating with brief arousals.     Event #3 was recorded on 8/5 at 8:00PM. No obvious clinical or EEG change. Patient felt like ???everything leaves me???.      Impression:  This is a normal 72 hour ambulatory video EEG study. The patient had 3 clinical events which were not epileptic in origin. No epileptic seizures were recorded.     Jacobo Forest, MD

## 2019-08-17 ENCOUNTER — Encounter: Admit: 2019-08-17 | Discharge: 2019-08-17

## 2019-08-17 NOTE — Telephone Encounter
Spoke with pt, discussed results of ambulatory VEEG which were normal. Pt asking what is next step. Does not have f/u scheduled as of this time. Will discuss with Dr. Phylliss Bob

## 2019-08-19 ENCOUNTER — Encounter: Admit: 2019-08-19 | Discharge: 2019-08-19

## 2019-08-19 DIAGNOSIS — J3489 Other specified disorders of nose and nasal sinuses: Secondary | ICD-10-CM

## 2019-08-19 NOTE — Telephone Encounter
Patient should have a follow up appointment.

## 2019-08-20 ENCOUNTER — Encounter: Admit: 2019-08-20 | Discharge: 2019-08-20

## 2019-08-24 ENCOUNTER — Encounter: Admit: 2019-08-24 | Discharge: 2019-08-24

## 2019-08-24 DIAGNOSIS — K760 Fatty (change of) liver, not elsewhere classified: Secondary | ICD-10-CM

## 2019-08-24 DIAGNOSIS — G4733 Obstructive sleep apnea (adult) (pediatric): Secondary | ICD-10-CM

## 2019-08-24 DIAGNOSIS — M48061 Spinal stenosis, lumbar region without neurogenic claudication: Secondary | ICD-10-CM

## 2019-08-24 DIAGNOSIS — R413 Other amnesia: Secondary | ICD-10-CM

## 2019-08-24 DIAGNOSIS — G629 Polyneuropathy, unspecified: Secondary | ICD-10-CM

## 2019-08-24 DIAGNOSIS — E039 Hypothyroidism, unspecified: Secondary | ICD-10-CM

## 2019-08-24 DIAGNOSIS — J45909 Unspecified asthma, uncomplicated: Secondary | ICD-10-CM

## 2019-08-24 DIAGNOSIS — R3989 Other symptoms and signs involving the genitourinary system: Secondary | ICD-10-CM

## 2019-08-24 DIAGNOSIS — R4189 Other symptoms and signs involving cognitive functions and awareness: Secondary | ICD-10-CM

## 2019-08-24 DIAGNOSIS — J40 Bronchitis, not specified as acute or chronic: Secondary | ICD-10-CM

## 2019-08-24 DIAGNOSIS — J309 Allergic rhinitis, unspecified: Secondary | ICD-10-CM

## 2019-08-24 DIAGNOSIS — K219 Gastro-esophageal reflux disease without esophagitis: Secondary | ICD-10-CM

## 2019-08-24 DIAGNOSIS — M4802 Spinal stenosis, cervical region: Secondary | ICD-10-CM

## 2019-08-24 DIAGNOSIS — E785 Hyperlipidemia, unspecified: Secondary | ICD-10-CM

## 2019-08-24 DIAGNOSIS — H547 Unspecified visual loss: Secondary | ICD-10-CM

## 2019-08-24 DIAGNOSIS — E669 Obesity, unspecified: Secondary | ICD-10-CM

## 2019-08-24 DIAGNOSIS — F445 Conversion disorder with seizures or convulsions: Secondary | ICD-10-CM

## 2019-08-24 DIAGNOSIS — M1990 Unspecified osteoarthritis, unspecified site: Secondary | ICD-10-CM

## 2019-08-24 DIAGNOSIS — R29898 Other symptoms and signs involving the musculoskeletal system: Secondary | ICD-10-CM

## 2019-08-24 DIAGNOSIS — G479 Sleep disorder, unspecified: Secondary | ICD-10-CM

## 2019-08-24 DIAGNOSIS — G47419 Narcolepsy without cataplexy: Secondary | ICD-10-CM

## 2019-08-24 DIAGNOSIS — E119 Type 2 diabetes mellitus without complications: Secondary | ICD-10-CM

## 2019-08-24 DIAGNOSIS — R32 Unspecified urinary incontinence: Secondary | ICD-10-CM

## 2019-09-01 ENCOUNTER — Ambulatory Visit: Admit: 2019-09-01 | Discharge: 2019-09-01

## 2019-09-01 ENCOUNTER — Encounter: Admit: 2019-09-01 | Discharge: 2019-09-01

## 2019-09-01 DIAGNOSIS — M4802 Spinal stenosis, cervical region: Secondary | ICD-10-CM

## 2019-09-01 DIAGNOSIS — E785 Hyperlipidemia, unspecified: Secondary | ICD-10-CM

## 2019-09-01 DIAGNOSIS — R4189 Other symptoms and signs involving cognitive functions and awareness: Secondary | ICD-10-CM

## 2019-09-01 DIAGNOSIS — E039 Hypothyroidism, unspecified: Secondary | ICD-10-CM

## 2019-09-01 DIAGNOSIS — M48061 Spinal stenosis, lumbar region without neurogenic claudication: Secondary | ICD-10-CM

## 2019-09-01 DIAGNOSIS — J3489 Other specified disorders of nose and nasal sinuses: Principal | ICD-10-CM

## 2019-09-01 DIAGNOSIS — K760 Fatty (change of) liver, not elsewhere classified: Secondary | ICD-10-CM

## 2019-09-01 DIAGNOSIS — E119 Type 2 diabetes mellitus without complications: Secondary | ICD-10-CM

## 2019-09-01 DIAGNOSIS — F445 Conversion disorder with seizures or convulsions: Secondary | ICD-10-CM

## 2019-09-01 DIAGNOSIS — J45909 Unspecified asthma, uncomplicated: Secondary | ICD-10-CM

## 2019-09-01 DIAGNOSIS — K219 Gastro-esophageal reflux disease without esophagitis: Secondary | ICD-10-CM

## 2019-09-01 DIAGNOSIS — R29898 Other symptoms and signs involving the musculoskeletal system: Secondary | ICD-10-CM

## 2019-09-01 DIAGNOSIS — J309 Allergic rhinitis, unspecified: Secondary | ICD-10-CM

## 2019-09-01 DIAGNOSIS — R32 Unspecified urinary incontinence: Secondary | ICD-10-CM

## 2019-09-01 DIAGNOSIS — G479 Sleep disorder, unspecified: Secondary | ICD-10-CM

## 2019-09-01 DIAGNOSIS — G629 Polyneuropathy, unspecified: Secondary | ICD-10-CM

## 2019-09-01 DIAGNOSIS — G4733 Obstructive sleep apnea (adult) (pediatric): Secondary | ICD-10-CM

## 2019-09-01 DIAGNOSIS — E669 Obesity, unspecified: Secondary | ICD-10-CM

## 2019-09-01 DIAGNOSIS — R3989 Other symptoms and signs involving the genitourinary system: Secondary | ICD-10-CM

## 2019-09-01 DIAGNOSIS — H547 Unspecified visual loss: Secondary | ICD-10-CM

## 2019-09-01 DIAGNOSIS — J40 Bronchitis, not specified as acute or chronic: Secondary | ICD-10-CM

## 2019-09-01 DIAGNOSIS — M1990 Unspecified osteoarthritis, unspecified site: Secondary | ICD-10-CM

## 2019-09-01 DIAGNOSIS — R413 Other amnesia: Secondary | ICD-10-CM

## 2019-09-01 DIAGNOSIS — G47419 Narcolepsy without cataplexy: Secondary | ICD-10-CM

## 2019-09-01 MED ORDER — DOXYCYCLINE HYCLATE 100 MG PO TAB
100 mg | ORAL_TABLET | Freq: Two times a day (BID) | ORAL | 0 refills | 8.00000 days | Status: AC
Start: 2019-09-01 — End: ?

## 2019-09-07 ENCOUNTER — Encounter: Admit: 2019-09-07 | Discharge: 2019-09-07 | Payer: MEDICARE

## 2019-09-08 ENCOUNTER — Encounter: Admit: 2019-09-08 | Discharge: 2019-09-08 | Payer: MEDICARE

## 2019-09-08 NOTE — Telephone Encounter
Called patient with CT results.  She did not want to make a f/u appt at this time because she needs to coordinate with her daughter.  She will call back to schedule that f/u appt. Call center number provided.

## 2019-09-30 NOTE — Progress Notes
Date of Service: 09/30/2019    Subjective:             Hailey Rodgers is a 68 y.o. female.    History of Present Illness  Will be celebrating her 50th wedding anniversary.     Continues to have her events of falling. About a week ago she was getting a hip XRay. The technician left the room. She was having a lot of hip pain. She texted her daughter that i'm not feeling very well. 10-15 minutes later they came to find her daughter. Daughter found her on the Xray table and she was out. She was not responsive. She was seen at [facility]. Blood sugar was checked and was high.   She has had epidural injections since 05/2018 and has not had any seizure like activity with them.   She has not had any events with falling backwards since last year.   She is doing aqua therapy for her balance and strength.     She has been found to have a borderline anemia. Otherwise There has been no significant change to her past medical, surgical, or social history      Review of Systems   Constitutional: Positive for fatigue.   HENT: Positive for rhinorrhea.    Musculoskeletal: Positive for arthralgias, back pain and gait problem.   Neurological: Positive for seizures and numbness.   All other systems reviewed and are negative.        Objective:         ? acetaminophen (TYLENOL) 325 mg tablet Take 2 Tabs by mouth every 4 hours as needed. (Patient taking differently: Take 650 mg by mouth as Needed.)   ? acyclovir (ZOVIRAX) 800 mg tablet Take 800 mg by mouth every 12 hours.   ? aspirin 81 mg chewable tablet Chew 81 mg by mouth daily.   ? blood-glucose sensor (DEXCOM G6 SENSOR MISC) Use  as directed.   ? CALCIUM CARBONATE (CALTRATE 600 PO) Take 1 Tab by mouth twice daily.   ? celecoxib (CELEBREX) 100 mg capsule Take 100 mg by mouth twice daily.   ? diazePAM (VALIUM) 10 mg tablet Take 5 mg by mouth every 6 hours as needed for Anxiety.   ? dicyclomine (BENTYL) 10 mg capsule Take 10 mg by mouth four times daily. ? doxycycline (VIBRAMYCIN) 100 mg tablet Take 100 mg by mouth twice daily.   ? ergocalciferol (VITAMIN D-2) 50,000 unit capsule Take 50,000 Units by mouth every 7 days.   ? fenofibrate nanocrystallized (TRICOR) 145 mg tablet Take 145 mg by mouth at bedtime daily.   ? fexofenadine(+) (ALLEGRA) 180 mg tablet Take 180 mg by mouth at bedtime daily.   ? fish oil- omega 3-DHA/EPA 300/1,000 mg capsule Take 1 Cap by mouth at bedtime daily.   ? FLUoxetine (PROZAC) 40 mg capsule Take two capsules by mouth daily. Indications: anxiousness associated with depression   ? furosemide (LASIX) 40 mg tablet Take 40 mg by mouth as Needed.   ? hydrOXYzine (ATARAX) 10 mg tablet Take one tablet by mouth twice daily as needed for Anxiety.   ? insulin aspart (NOVOLOG) 100 unit/mL injection Take 25 units with breakfast, 22 units with lunch, and 30 units with dinner.  Indications: TYPE 2 DIABETES MELLITUS (Patient taking differently: Take 25 units with breakfast, 22 units with lunch, and 30 units with dinner. Sliding scale)   ? Insulin Needles (Disposable) (NANO PEN NEEDLE) 32 gauge x 5/32 ndle Use 1 Each as directed before meals  and at bedtime.   ? Insulin NPH (HUMULIN N KWIKPEN) 100 unit/mL (3 mL) injection PEN Take 25 units in AM and 42 units at bedtime. (Patient taking differently: Take 50 units at bedtime.)   ? levothyroxine (SYNTHROID) 88 mcg tablet Take 88 mcg by mouth daily.   ? metFORMIN-XR(+) (GLUCOPHAGE XR) 500 mg extended release tablet Take 500 mg by mouth twice daily.   ? montelukast (SINGULAIR) 10 mg tablet Take 10 mg by mouth at bedtime daily.   ? MULTIVITAMINS WITH FLUORIDE (MULTI-VITAMIN PO) Take 1 Tab by mouth at bedtime daily.   ? mupirocin (BACTROBAN) 2 % topical ointment Add pea size amount to rinse bottle and irrigate each nostril BID   ? oxybutynin XL (DITROPAN XL) 10 mg tablet Take 1 Tab by mouth daily. Do not cut/ crush/ chew   ? pantoprazole DR (PROTONIX) 40 mg tablet Take 40 mg by mouth twice daily. ? potassium chloride SR (K-DUR) 20 mEq tablet Take 20 mEq by mouth as Needed. With lasix   ? Pramipexole (MIRAPEX) 0.75 mg tab Take 0.75 mg by mouth twice daily.   ? senna/docusate (SENOKOT-S) 8.6/50 mg tablet Take 1 Tab by mouth twice daily. (Patient taking differently: Take 1 tablet by mouth twice daily as needed.)   ? simvastatin (ZOCOR) 40 mg tablet Take 40 mg by mouth at bedtime daily.   ? sinus rinse pkdv kit Insert 1 Packet into nose as directed as Needed.   ? tobramycin 0.3%/dexamethasone 0.1% (TOBRADEX) 0.3/0.1 % ophthalmic suspension Apply one drop to both eyes four times daily.   ? vitamin E 400 unit capsule Take 400 mg by mouth daily.     Vitals:    09/30/19 1515   BP: (!) 145/60   Pulse: 81   Weight: 101.6 kg (224 lb)   Height: 170.2 cm (67)     Body mass index is 35.08 kg/m?[name]     Physical Exam    Mental Status:Grossly normal  Speech is fluent without dysarthria.    CN:   III, IV, VI: EOEMI without nystagmus  VII: facial movements are symmetric      Motor:  No pronator drift.        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 She is in treatment with a therapist but has not had any treatment since COVID pandemic. Has an upcoming appointment with Dr. Durene Cal.       Memory impairment  Daughter has noticed patient's memory is declining. Patient does endorse a significant amount of daytime sleepiness despite being treated for OSA.   Will refer to neuropsychology although I suspect her memory loss is multifactorial- polypharmacy vs OSA. Time spent with the patient was 30 minutes, 25 minutes of which were toward  counseling regarding diagnosis, prognosis, and management of psychogenic non-epileptic seizures and memory problems.

## 2019-09-30 NOTE — Assessment & Plan Note
Daughter has noticed patient's memory is declining. Patient does endorse a significant amount of daytime sleepiness despite being treated for OSA.   Will refer to neuropsychology although I suspect her memory loss is multifactorial- polypharmacy vs OSA.

## 2019-09-30 NOTE — Assessment & Plan Note
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 She is in treatment with a therapist but has not had any treatment since [place] pandemic. Has an upcoming appointment with Dr. Yong Channel.

## 2019-10-05 ENCOUNTER — Encounter: Admit: 2019-10-05 | Discharge: 2019-10-05 | Payer: MEDICARE

## 2019-10-06 MED ORDER — FLUOXETINE 40 MG PO CAP
ORAL_CAPSULE | Freq: Every day | 0 refills
Start: 2019-10-06 — End: ?

## 2019-10-20 ENCOUNTER — Ambulatory Visit: Admit: 2019-10-20 | Discharge: 2019-10-20 | Payer: MEDICARE

## 2019-10-20 ENCOUNTER — Encounter: Admit: 2019-10-20 | Discharge: 2019-10-20 | Payer: MEDICARE

## 2019-10-20 DIAGNOSIS — G4733 Obstructive sleep apnea (adult) (pediatric): Secondary | ICD-10-CM

## 2019-10-20 DIAGNOSIS — M4802 Spinal stenosis, cervical region: Secondary | ICD-10-CM

## 2019-10-20 DIAGNOSIS — J309 Allergic rhinitis, unspecified: Secondary | ICD-10-CM

## 2019-10-20 DIAGNOSIS — G629 Polyneuropathy, unspecified: Secondary | ICD-10-CM

## 2019-10-20 DIAGNOSIS — R29898 Other symptoms and signs involving the musculoskeletal system: Secondary | ICD-10-CM

## 2019-10-20 DIAGNOSIS — R413 Other amnesia: Secondary | ICD-10-CM

## 2019-10-20 DIAGNOSIS — J45909 Unspecified asthma, uncomplicated: Secondary | ICD-10-CM

## 2019-10-20 DIAGNOSIS — F445 Conversion disorder with seizures or convulsions: Secondary | ICD-10-CM

## 2019-10-20 DIAGNOSIS — K219 Gastro-esophageal reflux disease without esophagitis: Secondary | ICD-10-CM

## 2019-10-20 DIAGNOSIS — J340 Abscess, furuncle and carbuncle of nose: Secondary | ICD-10-CM

## 2019-10-20 DIAGNOSIS — R4189 Other symptoms and signs involving cognitive functions and awareness: Secondary | ICD-10-CM

## 2019-10-20 DIAGNOSIS — G47419 Narcolepsy without cataplexy: Secondary | ICD-10-CM

## 2019-10-20 DIAGNOSIS — E119 Type 2 diabetes mellitus without complications: Secondary | ICD-10-CM

## 2019-10-20 DIAGNOSIS — J40 Bronchitis, not specified as acute or chronic: Secondary | ICD-10-CM

## 2019-10-20 DIAGNOSIS — R32 Unspecified urinary incontinence: Secondary | ICD-10-CM

## 2019-10-20 DIAGNOSIS — E785 Hyperlipidemia, unspecified: Secondary | ICD-10-CM

## 2019-10-20 DIAGNOSIS — M48061 Spinal stenosis, lumbar region without neurogenic claudication: Secondary | ICD-10-CM

## 2019-10-20 DIAGNOSIS — H547 Unspecified visual loss: Secondary | ICD-10-CM

## 2019-10-20 DIAGNOSIS — E039 Hypothyroidism, unspecified: Secondary | ICD-10-CM

## 2019-10-20 DIAGNOSIS — R3989 Other symptoms and signs involving the genitourinary system: Secondary | ICD-10-CM

## 2019-10-20 DIAGNOSIS — K760 Fatty (change of) liver, not elsewhere classified: Secondary | ICD-10-CM

## 2019-10-20 DIAGNOSIS — M1990 Unspecified osteoarthritis, unspecified site: Secondary | ICD-10-CM

## 2019-10-20 DIAGNOSIS — E669 Obesity, unspecified: Secondary | ICD-10-CM

## 2019-10-20 DIAGNOSIS — G479 Sleep disorder, unspecified: Secondary | ICD-10-CM

## 2019-10-21 ENCOUNTER — Encounter: Admit: 2019-10-21 | Discharge: 2019-10-21 | Payer: MEDICARE

## 2019-10-22 ENCOUNTER — Encounter: Admit: 2019-10-22 | Discharge: 2019-10-22 | Payer: MEDICARE

## 2019-10-22 DIAGNOSIS — J340 Abscess, furuncle and carbuncle of nose: Secondary | ICD-10-CM

## 2019-10-22 MED ORDER — RXAMB MUPIROCIN 30 MG CAPSULE (COMPOUND)
ORAL_CAPSULE | ORAL | 3 refills | 11.00000 days | Status: AC
Start: 2019-10-22 — End: ?

## 2019-10-22 NOTE — Telephone Encounter
Called patient with lab results per Dr. Jodene Nam.  Explained compounded medication method and she verbalizes understanding.  4 week f/u appt made also

## 2019-10-22 NOTE — Telephone Encounter
-----   Message from Romeo Rabon, MD sent at 10/22/2019 10:43 AM CDT -----  Call with results.  Labs ok, no evidence of vasculitis or common autoimmune disease affecting the nose.  Please order bactroban irrigations (compounded powder, highest dose).  If cannot get that for her then could do clindamycin irrigations.  Both should be BID for at least 1 month.  Have her follow up with me prior to stopping the antibiotics (depending on the response she may have to use for several months).

## 2019-10-23 ENCOUNTER — Encounter: Admit: 2019-10-23 | Discharge: 2019-10-23 | Payer: MEDICARE

## 2019-10-23 MED ORDER — FLUOXETINE 40 MG PO CAP
ORAL_CAPSULE | Freq: Every day | ORAL | 1 refills | 30.00000 days | Status: DC
Start: 2019-10-23 — End: 2020-04-13

## 2019-10-24 ENCOUNTER — Encounter: Admit: 2019-10-24 | Discharge: 2019-10-24 | Payer: MEDICARE

## 2019-10-25 ENCOUNTER — Encounter: Admit: 2019-10-25 | Discharge: 2019-10-25 | Payer: MEDICARE

## 2019-10-27 ENCOUNTER — Encounter: Admit: 2019-10-27 | Discharge: 2019-10-27 | Payer: MEDICARE

## 2019-10-28 ENCOUNTER — Encounter: Admit: 2019-10-28 | Discharge: 2019-10-28 | Payer: MEDICARE

## 2019-11-03 IMAGING — CR CHEST
3 series · 3 of 3 positions shown · non-contrast
Comparison: none

[shoulder external]
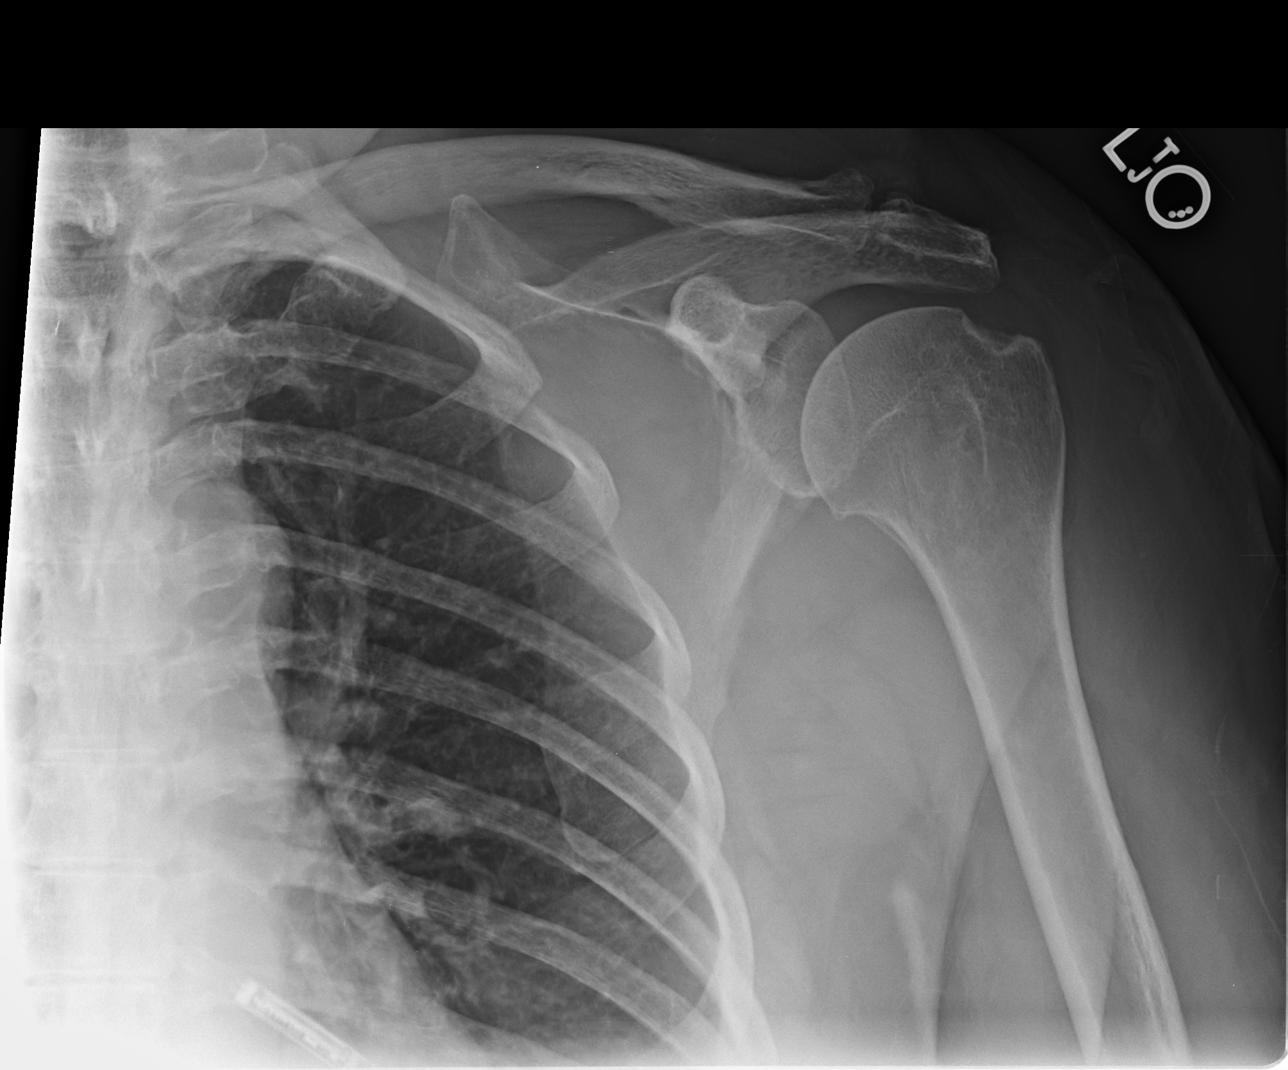

[shoulder internal]
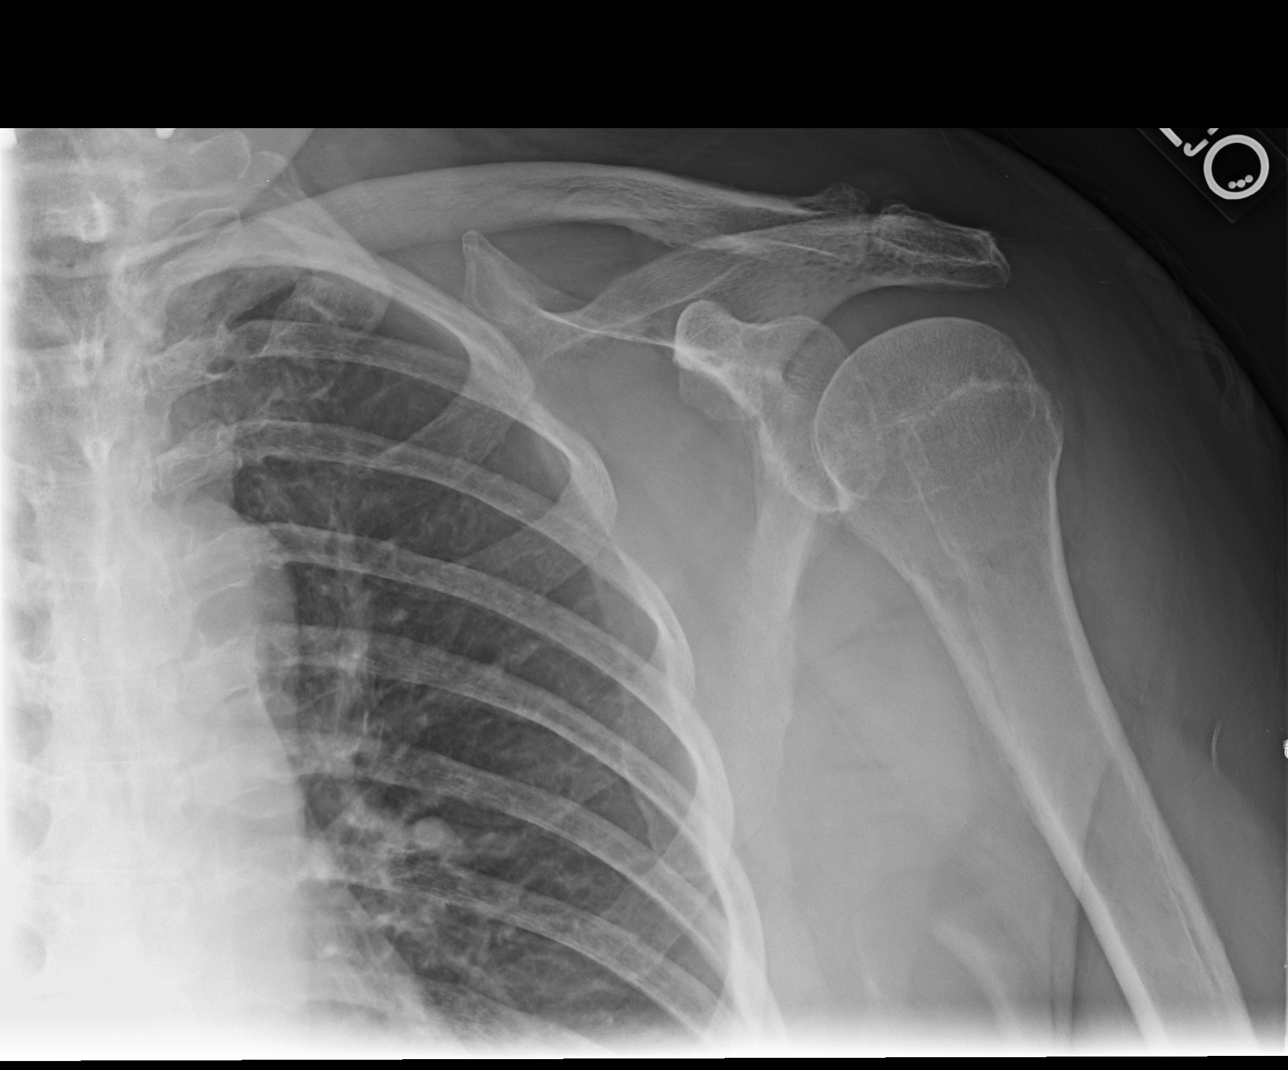

[shoulder y-view]
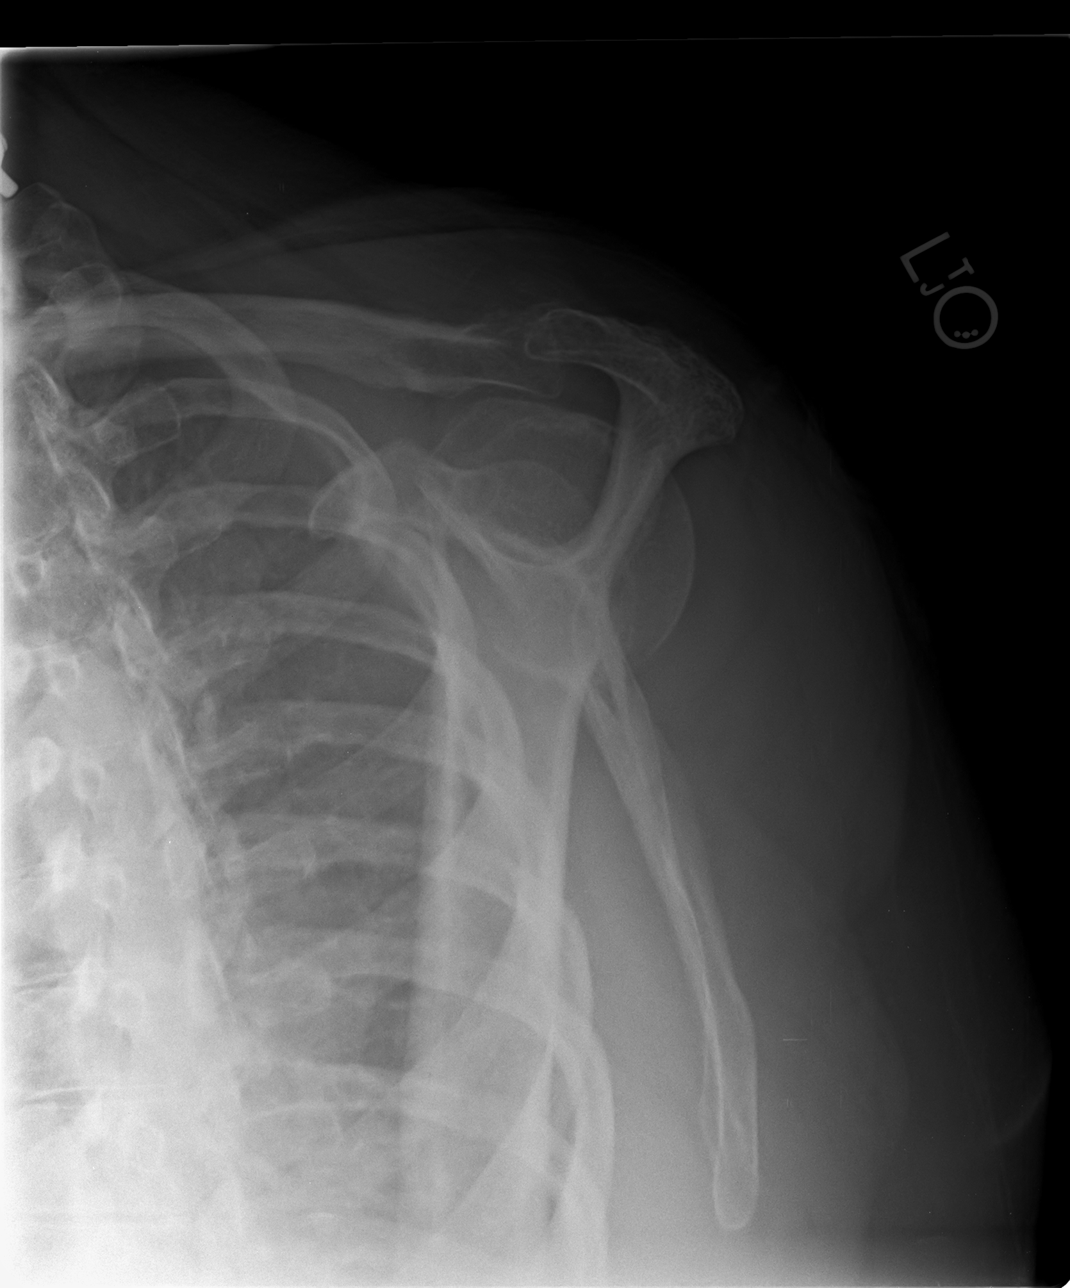

[3 of 3 positions shown; findings below may reference images not displayed]

EXAM

XR shoulder left, complete

INDICATION

Status post fall

TECHNIQUE

Three views of left shoulder

COMPARISONS

None available at the time of dictation.

FINDINGS

No radiographic evidence of an acute fracture, osseous malalignment, or aggressive focal osseous
lesion.

Mild degenerative change without joint space loss of the left glenohumeral joint. There is
mild-to-moderate degenerative change of the left acromioclavicular joint with at least mild joint
space loss and marginal osteophytosis.

IMPRESSION
- No radiographic evidence of an acute osseous abnormality of the left shoulder.
- Degenerative change of the glenohumeral and acromioclavicular joints.

Tech Notes:

PT C/O FALL X 4 DAYS AGO. PAIN ON LATERAL SIDE. TJ/TM

## 2019-11-22 MED ORDER — TRIAMCINOLONE ACETONIDE 0.025 % TP OINT
Freq: Three times a day (TID) | 3 refills | Status: AC
Start: 2019-11-22 — End: ?

## 2019-11-30 ENCOUNTER — Encounter: Admit: 2019-11-30 | Discharge: 2019-11-30 | Payer: MEDICARE

## 2019-11-30 NOTE — Telephone Encounter
Called with culture results per Dr. Jodene Nam.  Instructed to continue with both ointments and call back to let us know if improved in 2-3 weeks.

## 2019-11-30 NOTE — Telephone Encounter
-----   Message from Romeo Rabon, MD sent at 11/29/2019  3:25 PM CST -----  Call with result.  I would like her to continue triamcinolone and mupirocin as well (use both, one for inflammation and one for the bacteria).  Call me in 2-3 weeks to let me know how she feels

## 2019-12-12 ENCOUNTER — Encounter: Admit: 2019-12-12 | Discharge: 2019-12-12 | Payer: MEDICARE

## 2019-12-12 DIAGNOSIS — K760 Fatty (change of) liver, not elsewhere classified: Secondary | ICD-10-CM

## 2019-12-12 DIAGNOSIS — G4733 Obstructive sleep apnea (adult) (pediatric): Secondary | ICD-10-CM

## 2019-12-12 DIAGNOSIS — R413 Other amnesia: Secondary | ICD-10-CM

## 2019-12-12 DIAGNOSIS — G629 Polyneuropathy, unspecified: Secondary | ICD-10-CM

## 2019-12-12 DIAGNOSIS — J40 Bronchitis, not specified as acute or chronic: Secondary | ICD-10-CM

## 2019-12-12 DIAGNOSIS — R32 Unspecified urinary incontinence: Secondary | ICD-10-CM

## 2019-12-12 DIAGNOSIS — J309 Allergic rhinitis, unspecified: Secondary | ICD-10-CM

## 2019-12-12 DIAGNOSIS — G47419 Narcolepsy without cataplexy: Secondary | ICD-10-CM

## 2019-12-12 DIAGNOSIS — R3989 Other symptoms and signs involving the genitourinary system: Secondary | ICD-10-CM

## 2019-12-12 DIAGNOSIS — K219 Gastro-esophageal reflux disease without esophagitis: Secondary | ICD-10-CM

## 2019-12-12 DIAGNOSIS — H547 Unspecified visual loss: Secondary | ICD-10-CM

## 2019-12-12 DIAGNOSIS — M1990 Unspecified osteoarthritis, unspecified site: Secondary | ICD-10-CM

## 2019-12-12 DIAGNOSIS — M48061 Spinal stenosis, lumbar region without neurogenic claudication: Secondary | ICD-10-CM

## 2019-12-12 DIAGNOSIS — R29898 Other symptoms and signs involving the musculoskeletal system: Secondary | ICD-10-CM

## 2019-12-12 DIAGNOSIS — G479 Sleep disorder, unspecified: Secondary | ICD-10-CM

## 2019-12-12 DIAGNOSIS — E039 Hypothyroidism, unspecified: Secondary | ICD-10-CM

## 2019-12-12 DIAGNOSIS — F445 Conversion disorder with seizures or convulsions: Secondary | ICD-10-CM

## 2019-12-12 DIAGNOSIS — J45909 Unspecified asthma, uncomplicated: Secondary | ICD-10-CM

## 2019-12-12 DIAGNOSIS — E119 Type 2 diabetes mellitus without complications: Secondary | ICD-10-CM

## 2019-12-12 DIAGNOSIS — M4802 Spinal stenosis, cervical region: Secondary | ICD-10-CM

## 2019-12-12 DIAGNOSIS — E785 Hyperlipidemia, unspecified: Secondary | ICD-10-CM

## 2019-12-12 DIAGNOSIS — R4189 Other symptoms and signs involving cognitive functions and awareness: Secondary | ICD-10-CM

## 2019-12-12 DIAGNOSIS — E669 Obesity, unspecified: Secondary | ICD-10-CM

## 2019-12-16 ENCOUNTER — Encounter: Admit: 2019-12-16 | Discharge: 2019-12-16 | Payer: MEDICARE

## 2019-12-16 NOTE — Telephone Encounter
Called to schedule appt for Monday for possible biopsy in office

## 2019-12-16 NOTE — Telephone Encounter
Patient was advised to call to provide an update.  She stated that she is still having scabs on left side of sinus, blowing some blood out and might have a sinus infection.

## 2020-01-05 ENCOUNTER — Ambulatory Visit: Admit: 2020-01-05 | Discharge: 2020-01-05 | Payer: MEDICARE

## 2020-01-05 ENCOUNTER — Encounter: Admit: 2020-01-05 | Discharge: 2020-01-05 | Payer: MEDICARE

## 2020-01-05 DIAGNOSIS — R4189 Other symptoms and signs involving cognitive functions and awareness: Secondary | ICD-10-CM

## 2020-01-05 DIAGNOSIS — R29898 Other symptoms and signs involving the musculoskeletal system: Secondary | ICD-10-CM

## 2020-01-05 DIAGNOSIS — M48061 Spinal stenosis, lumbar region without neurogenic claudication: Secondary | ICD-10-CM

## 2020-01-05 DIAGNOSIS — J309 Allergic rhinitis, unspecified: Secondary | ICD-10-CM

## 2020-01-05 DIAGNOSIS — G629 Polyneuropathy, unspecified: Secondary | ICD-10-CM

## 2020-01-05 DIAGNOSIS — M4802 Spinal stenosis, cervical region: Secondary | ICD-10-CM

## 2020-01-05 DIAGNOSIS — K219 Gastro-esophageal reflux disease without esophagitis: Secondary | ICD-10-CM

## 2020-01-05 DIAGNOSIS — R32 Unspecified urinary incontinence: Secondary | ICD-10-CM

## 2020-01-05 DIAGNOSIS — F445 Conversion disorder with seizures or convulsions: Secondary | ICD-10-CM

## 2020-01-05 DIAGNOSIS — E669 Obesity, unspecified: Secondary | ICD-10-CM

## 2020-01-05 DIAGNOSIS — J340 Abscess, furuncle and carbuncle of nose: Secondary | ICD-10-CM

## 2020-01-05 DIAGNOSIS — J45909 Unspecified asthma, uncomplicated: Secondary | ICD-10-CM

## 2020-01-05 DIAGNOSIS — R413 Other amnesia: Secondary | ICD-10-CM

## 2020-01-05 DIAGNOSIS — G47419 Narcolepsy without cataplexy: Secondary | ICD-10-CM

## 2020-01-05 DIAGNOSIS — H547 Unspecified visual loss: Secondary | ICD-10-CM

## 2020-01-05 DIAGNOSIS — E119 Type 2 diabetes mellitus without complications: Secondary | ICD-10-CM

## 2020-01-05 DIAGNOSIS — G4733 Obstructive sleep apnea (adult) (pediatric): Secondary | ICD-10-CM

## 2020-01-05 DIAGNOSIS — M1990 Unspecified osteoarthritis, unspecified site: Secondary | ICD-10-CM

## 2020-01-05 DIAGNOSIS — G479 Sleep disorder, unspecified: Secondary | ICD-10-CM

## 2020-01-05 DIAGNOSIS — J40 Bronchitis, not specified as acute or chronic: Secondary | ICD-10-CM

## 2020-01-05 DIAGNOSIS — R3989 Other symptoms and signs involving the genitourinary system: Secondary | ICD-10-CM

## 2020-01-05 DIAGNOSIS — E039 Hypothyroidism, unspecified: Secondary | ICD-10-CM

## 2020-01-05 DIAGNOSIS — E785 Hyperlipidemia, unspecified: Secondary | ICD-10-CM

## 2020-01-05 DIAGNOSIS — K760 Fatty (change of) liver, not elsewhere classified: Secondary | ICD-10-CM

## 2020-01-16 ENCOUNTER — Encounter: Admit: 2020-01-16 | Discharge: 2020-01-16 | Payer: MEDICARE

## 2020-01-16 DIAGNOSIS — M4802 Spinal stenosis, cervical region: Secondary | ICD-10-CM

## 2020-01-16 DIAGNOSIS — F445 Conversion disorder with seizures or convulsions: Secondary | ICD-10-CM

## 2020-01-16 DIAGNOSIS — G47419 Narcolepsy without cataplexy: Secondary | ICD-10-CM

## 2020-01-16 DIAGNOSIS — E669 Obesity, unspecified: Secondary | ICD-10-CM

## 2020-01-16 DIAGNOSIS — M1990 Unspecified osteoarthritis, unspecified site: Secondary | ICD-10-CM

## 2020-01-16 DIAGNOSIS — R413 Other amnesia: Secondary | ICD-10-CM

## 2020-01-16 DIAGNOSIS — G629 Polyneuropathy, unspecified: Secondary | ICD-10-CM

## 2020-01-16 DIAGNOSIS — G4733 Obstructive sleep apnea (adult) (pediatric): Secondary | ICD-10-CM

## 2020-01-16 DIAGNOSIS — J40 Bronchitis, not specified as acute or chronic: Secondary | ICD-10-CM

## 2020-01-16 DIAGNOSIS — J45909 Unspecified asthma, uncomplicated: Secondary | ICD-10-CM

## 2020-01-16 DIAGNOSIS — R3989 Other symptoms and signs involving the genitourinary system: Secondary | ICD-10-CM

## 2020-01-16 DIAGNOSIS — R32 Unspecified urinary incontinence: Secondary | ICD-10-CM

## 2020-01-16 DIAGNOSIS — K219 Gastro-esophageal reflux disease without esophagitis: Secondary | ICD-10-CM

## 2020-01-16 DIAGNOSIS — G479 Sleep disorder, unspecified: Secondary | ICD-10-CM

## 2020-01-16 DIAGNOSIS — E119 Type 2 diabetes mellitus without complications: Secondary | ICD-10-CM

## 2020-01-16 DIAGNOSIS — H547 Unspecified visual loss: Secondary | ICD-10-CM

## 2020-01-16 DIAGNOSIS — R4189 Other symptoms and signs involving cognitive functions and awareness: Secondary | ICD-10-CM

## 2020-01-16 DIAGNOSIS — J309 Allergic rhinitis, unspecified: Secondary | ICD-10-CM

## 2020-01-16 DIAGNOSIS — R29898 Other symptoms and signs involving the musculoskeletal system: Secondary | ICD-10-CM

## 2020-01-16 DIAGNOSIS — M48061 Spinal stenosis, lumbar region without neurogenic claudication: Secondary | ICD-10-CM

## 2020-01-16 DIAGNOSIS — K760 Fatty (change of) liver, not elsewhere classified: Secondary | ICD-10-CM

## 2020-01-16 DIAGNOSIS — E785 Hyperlipidemia, unspecified: Secondary | ICD-10-CM

## 2020-01-16 DIAGNOSIS — E039 Hypothyroidism, unspecified: Secondary | ICD-10-CM

## 2020-01-17 ENCOUNTER — Encounter: Admit: 2020-01-17 | Discharge: 2020-01-17 | Payer: MEDICARE

## 2020-01-17 NOTE — Telephone Encounter
Called with pathology results per Dr. Jodene Nam.  No answer.  LVM to continue topical treatments as prescribed and call back for further information.

## 2020-01-17 NOTE — Telephone Encounter
-----   Message from Romeo Rabon, MD sent at 01/14/2020  2:12 PM CST -----  Please call the patient with the results.  No evidence of vasculitis or autoimmune disease which is good.  For now all we can to is continue the topical treatments to hopefully improve the nose over time.

## 2020-01-18 ENCOUNTER — Encounter: Admit: 2020-01-18 | Discharge: 2020-01-18 | Payer: MEDICARE

## 2020-01-18 NOTE — Telephone Encounter
-----   Message from Romeo Rabon, MD sent at 01/14/2020  2:12 PM CST -----  Please call the patient with the results.  No evidence of vasculitis or autoimmune disease which is good.  For now all we can to is continue the topical treatments to hopefully improve the nose over time.

## 2020-01-18 NOTE — Telephone Encounter
Theodoro Kos with results per Dr. Jodene Nam.  Advised to continue topical mupirocin.  She has refills on both ointment and compound.  I advised her to use the ointment until she gets the compound medication in the mail and then to stop ointment and just use compounded mupirocin in her rinse for time being.

## 2020-01-24 ENCOUNTER — Encounter: Admit: 2020-01-24 | Discharge: 2020-01-24 | Payer: MEDICARE

## 2020-01-31 ENCOUNTER — Encounter: Admit: 2020-01-31 | Discharge: 2020-01-31 | Payer: MEDICARE

## 2020-01-31 NOTE — Telephone Encounter
Calling regarding some issues she having Neck pain and right arm numb all the time ever since the epidural

## 2020-01-31 NOTE — Telephone Encounter
Patient was trying to reach Dr. Lajoyce Corners with anesthesia at [facility].

## 2020-02-01 MED ORDER — HYDROXYZINE HCL 10 MG PO TAB
10 mg | ORAL_TABLET | Freq: Two times a day (BID) | ORAL | 0 refills | 30.00000 days | Status: DC | PRN
Start: 2020-02-01 — End: 2020-04-13

## 2020-02-02 ENCOUNTER — Ambulatory Visit: Admit: 2020-02-02 | Discharge: 2020-02-02 | Payer: MEDICARE

## 2020-02-02 ENCOUNTER — Encounter: Admit: 2020-02-02 | Discharge: 2020-02-02 | Payer: MEDICARE

## 2020-02-02 DIAGNOSIS — R29898 Other symptoms and signs involving the musculoskeletal system: Secondary | ICD-10-CM

## 2020-02-02 DIAGNOSIS — J40 Bronchitis, not specified as acute or chronic: Secondary | ICD-10-CM

## 2020-02-02 DIAGNOSIS — G47419 Narcolepsy without cataplexy: Secondary | ICD-10-CM

## 2020-02-02 DIAGNOSIS — E669 Obesity, unspecified: Secondary | ICD-10-CM

## 2020-02-02 DIAGNOSIS — J309 Allergic rhinitis, unspecified: Secondary | ICD-10-CM

## 2020-02-02 DIAGNOSIS — M48061 Spinal stenosis, lumbar region without neurogenic claudication: Secondary | ICD-10-CM

## 2020-02-02 DIAGNOSIS — M1990 Unspecified osteoarthritis, unspecified site: Secondary | ICD-10-CM

## 2020-02-02 DIAGNOSIS — R413 Other amnesia: Secondary | ICD-10-CM

## 2020-02-02 DIAGNOSIS — R3989 Other symptoms and signs involving the genitourinary system: Secondary | ICD-10-CM

## 2020-02-02 DIAGNOSIS — R32 Unspecified urinary incontinence: Secondary | ICD-10-CM

## 2020-02-02 DIAGNOSIS — G4733 Obstructive sleep apnea (adult) (pediatric): Secondary | ICD-10-CM

## 2020-02-02 DIAGNOSIS — M4802 Spinal stenosis, cervical region: Secondary | ICD-10-CM

## 2020-02-02 DIAGNOSIS — J45909 Unspecified asthma, uncomplicated: Secondary | ICD-10-CM

## 2020-02-02 DIAGNOSIS — E119 Type 2 diabetes mellitus without complications: Secondary | ICD-10-CM

## 2020-02-02 DIAGNOSIS — F445 Conversion disorder with seizures or convulsions: Secondary | ICD-10-CM

## 2020-02-02 DIAGNOSIS — H547 Unspecified visual loss: Secondary | ICD-10-CM

## 2020-02-02 DIAGNOSIS — K219 Gastro-esophageal reflux disease without esophagitis: Secondary | ICD-10-CM

## 2020-02-02 DIAGNOSIS — K760 Fatty (change of) liver, not elsewhere classified: Secondary | ICD-10-CM

## 2020-02-02 DIAGNOSIS — E785 Hyperlipidemia, unspecified: Secondary | ICD-10-CM

## 2020-02-02 DIAGNOSIS — E039 Hypothyroidism, unspecified: Secondary | ICD-10-CM

## 2020-02-02 DIAGNOSIS — G629 Polyneuropathy, unspecified: Secondary | ICD-10-CM

## 2020-02-02 DIAGNOSIS — F341 Dysthymic disorder: Secondary | ICD-10-CM

## 2020-02-02 DIAGNOSIS — F3342 Major depressive disorder, recurrent, in full remission: Secondary | ICD-10-CM

## 2020-02-02 DIAGNOSIS — R4189 Other symptoms and signs involving cognitive functions and awareness: Secondary | ICD-10-CM

## 2020-02-02 DIAGNOSIS — G479 Sleep disorder, unspecified: Secondary | ICD-10-CM

## 2020-02-02 NOTE — Progress Notes
4:00-4:15 PM    Patient requested call as she cannot use Zoom and cannot come in person.  She says she has not been seen in a long time.  Explored, she says she is doing pretty well, but has her "moments" which related to getting upset and emotional.  Her affects is full range with very mild undercurrents of sadness.  No SI/HI.  Provided supportive care and encouragement in the form of hope that we will eventually be able to meet in person.  Suggested scheduling for 6 weeks out for in person.  She is hopeful that she can get vaccinated by then.  Also discussed possibility of daughter helping to get connected on her phone via Zoom as she cannot figure it out.

## 2020-02-02 NOTE — Progress Notes
VITALS GIVEN PER PATIENT.  PATIENT CONSENTED TO PHONE CALL, AND PT VERIFIED NAME AND DATE OF BIRTH.

## 2020-03-02 ENCOUNTER — Encounter: Admit: 2020-03-02 | Discharge: 2020-03-02 | Payer: MEDICARE

## 2020-04-13 MED ORDER — HYDROXYZINE HCL 10 MG PO TAB
10 mg | ORAL_TABLET | Freq: Two times a day (BID) | ORAL | 2 refills | 30.00000 days | Status: AC | PRN
Start: 2020-04-13 — End: ?

## 2020-04-13 MED ORDER — FLUOXETINE 40 MG PO CAP
80 mg | ORAL_CAPSULE | Freq: Every day | ORAL | 1 refills | Status: AC
Start: 2020-04-13 — End: ?

## 2020-04-24 ENCOUNTER — Ambulatory Visit: Admit: 2020-04-24 | Discharge: 2020-04-25 | Payer: MEDICARE

## 2020-04-24 ENCOUNTER — Encounter: Admit: 2020-04-24 | Discharge: 2020-04-24 | Payer: MEDICARE

## 2020-04-24 DIAGNOSIS — H02831 Dermatochalasis of right upper eyelid: Secondary | ICD-10-CM

## 2020-04-24 DIAGNOSIS — H25813 Combined forms of age-related cataract, bilateral: Secondary | ICD-10-CM

## 2020-04-24 DIAGNOSIS — E113293 Type 2 diabetes mellitus with mild nonproliferative diabetic retinopathy without macular edema, bilateral: Secondary | ICD-10-CM

## 2020-04-24 DIAGNOSIS — H5052 Exophoria: Secondary | ICD-10-CM

## 2020-04-24 DIAGNOSIS — H5213 Myopia, bilateral: Secondary | ICD-10-CM

## 2020-04-24 NOTE — Progress Notes
Body mass index is 33.36 kg/m?[name]              Assessment and Plan:  A/ 1. 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 Exophoria OU   -with CI, has prism in her current glasses   -does not note diplopia with current amount of prism       4. Dermatochalasis OU       -no visual complaints       5. Myopic astigmatism with presbyopia OU      P/ 1. Reviewed with patient need for tight control of diabetes to help maintain ocular health. Educated on reduced risk of retinopathy if maintains A1c under 7.0. Monitor with dilated examination and OCT of the macula in 6 months.       2. Monitor for visual change with examination in 1 year. Discussed potential need for cataract surgery in future. Consider referral if worsening vision or increasing symptoms at next examination.        3. Continue with prism in glasses. Prescription for glasses provided to patient       4. Monitor with examination        5. Prescription for glasses provided to patient

## 2020-05-02 ENCOUNTER — Encounter: Admit: 2020-05-02 | Discharge: 2020-05-02 | Payer: MEDICARE

## 2020-10-05 ENCOUNTER — Encounter: Admit: 2020-10-05 | Discharge: 2020-10-05 | Payer: MEDICARE

## 2020-10-05 ENCOUNTER — Inpatient Hospital Stay: Admit: 2020-10-05 | Payer: MEDICARE

## 2020-10-05 ENCOUNTER — Emergency Department: Admit: 2020-10-05 | Discharge: 2020-10-05 | Payer: MEDICARE

## 2020-10-05 DIAGNOSIS — K219 Gastro-esophageal reflux disease without esophagitis: Secondary | ICD-10-CM

## 2020-10-05 DIAGNOSIS — J309 Allergic rhinitis, unspecified: Secondary | ICD-10-CM

## 2020-10-05 DIAGNOSIS — R29898 Other symptoms and signs involving the musculoskeletal system: Secondary | ICD-10-CM

## 2020-10-05 DIAGNOSIS — J45909 Unspecified asthma, uncomplicated: Secondary | ICD-10-CM

## 2020-10-05 DIAGNOSIS — E119 Type 2 diabetes mellitus without complications: Secondary | ICD-10-CM

## 2020-10-05 DIAGNOSIS — G479 Sleep disorder, unspecified: Secondary | ICD-10-CM

## 2020-10-05 DIAGNOSIS — M4802 Spinal stenosis, cervical region: Secondary | ICD-10-CM

## 2020-10-05 DIAGNOSIS — E669 Obesity, unspecified: Secondary | ICD-10-CM

## 2020-10-05 DIAGNOSIS — K760 Fatty (change of) liver, not elsewhere classified: Secondary | ICD-10-CM

## 2020-10-05 DIAGNOSIS — E785 Hyperlipidemia, unspecified: Secondary | ICD-10-CM

## 2020-10-05 DIAGNOSIS — G4733 Obstructive sleep apnea (adult) (pediatric): Secondary | ICD-10-CM

## 2020-10-05 DIAGNOSIS — R4189 Other symptoms and signs involving cognitive functions and awareness: Secondary | ICD-10-CM

## 2020-10-05 DIAGNOSIS — R296 Repeated falls: Secondary | ICD-10-CM

## 2020-10-05 DIAGNOSIS — R3989 Other symptoms and signs involving the genitourinary system: Secondary | ICD-10-CM

## 2020-10-05 DIAGNOSIS — G47419 Narcolepsy without cataplexy: Secondary | ICD-10-CM

## 2020-10-05 DIAGNOSIS — R569 Unspecified convulsions: Secondary | ICD-10-CM

## 2020-10-05 DIAGNOSIS — G629 Polyneuropathy, unspecified: Secondary | ICD-10-CM

## 2020-10-05 DIAGNOSIS — J40 Bronchitis, not specified as acute or chronic: Secondary | ICD-10-CM

## 2020-10-05 DIAGNOSIS — M1990 Unspecified osteoarthritis, unspecified site: Secondary | ICD-10-CM

## 2020-10-05 DIAGNOSIS — R413 Other amnesia: Secondary | ICD-10-CM

## 2020-10-05 DIAGNOSIS — R32 Unspecified urinary incontinence: Secondary | ICD-10-CM

## 2020-10-05 DIAGNOSIS — H547 Unspecified visual loss: Secondary | ICD-10-CM

## 2020-10-05 DIAGNOSIS — M48061 Spinal stenosis, lumbar region without neurogenic claudication: Secondary | ICD-10-CM

## 2020-10-05 DIAGNOSIS — E039 Hypothyroidism, unspecified: Secondary | ICD-10-CM

## 2020-10-05 DIAGNOSIS — W19XXXA Unspecified fall, initial encounter: Secondary | ICD-10-CM

## 2020-10-05 MED ORDER — LEVOTHYROXINE 88 MCG PO TAB
88 ug | Freq: Every day | ORAL | 0 refills
Start: 2020-10-05 — End: ?
  Administered 2020-10-06 – 2020-10-07 (×2): 88 ug via ORAL

## 2020-10-05 MED ORDER — ACETAMINOPHEN 500 MG PO TAB
1000 mg | Freq: Once | ORAL | 0 refills | Status: CP
Start: 2020-10-05 — End: ?

## 2020-10-05 MED ORDER — IOHEXOL 350 MG IODINE/ML IV SOLN
100 mL | Freq: Once | INTRAVENOUS | 0 refills | Status: CP
Start: 2020-10-05 — End: ?
  Administered 2020-10-06: 03:00:00 100 mL via INTRAVENOUS

## 2020-10-05 MED ORDER — ASPIRIN 81 MG PO CHEW
81 mg | Freq: Every day | ORAL | 0 refills
Start: 2020-10-05 — End: ?
  Administered 2020-10-06 – 2020-10-07 (×2): 81 mg via ORAL

## 2020-10-05 MED ORDER — SIMVASTATIN 40 MG PO TAB
40 mg | Freq: Every evening | ORAL | 0 refills
Start: 2020-10-05 — End: ?

## 2020-10-05 MED ORDER — SODIUM CHLORIDE 0.9 % IJ SOLN
50 mL | Freq: Once | INTRAVENOUS | 0 refills | Status: CP
Start: 2020-10-05 — End: ?
  Administered 2020-10-06: 03:00:00 50 mL via INTRAVENOUS

## 2020-10-05 MED ORDER — FENTANYL CITRATE (PF) 50 MCG/ML IJ SOLN
25 ug | Freq: Once | INTRAVENOUS | 0 refills | Status: CP
Start: 2020-10-05 — End: ?
  Administered 2020-10-06: 02:00:00 25 ug via INTRAVENOUS

## 2020-10-05 MED ORDER — OXYBUTYNIN CHLORIDE 10 MG PO TR24
10 mg | Freq: Every day | ORAL | 0 refills
Start: 2020-10-05 — End: ?
  Administered 2020-10-06 – 2020-10-07 (×2): 10 mg via ORAL

## 2020-10-05 MED ORDER — FLUOXETINE 20 MG PO CAP
80 mg | Freq: Every day | ORAL | 0 refills
Start: 2020-10-05 — End: ?
  Administered 2020-10-06 – 2020-10-07 (×2): 80 mg via ORAL

## 2020-10-05 MED ORDER — PANTOPRAZOLE 40 MG PO TBEC
40 mg | Freq: Two times a day (BID) | ORAL | 0 refills
Start: 2020-10-05 — End: ?
  Administered 2020-10-06 – 2020-10-07 (×2): 40 mg via ORAL

## 2020-10-05 MED ORDER — ENOXAPARIN 40 MG/0.4 ML SC SYRG
40 mg | Freq: Every day | SUBCUTANEOUS | 0 refills
Start: 2020-10-05 — End: ?

## 2020-10-06 ENCOUNTER — Observation Stay: Admit: 2020-10-06 | Discharge: 2020-10-06 | Payer: MEDICARE

## 2020-10-06 ENCOUNTER — Emergency Department: Admit: 2020-10-06 | Discharge: 2020-10-05 | Payer: MEDICARE

## 2020-10-06 ENCOUNTER — Encounter: Admit: 2020-10-06 | Discharge: 2020-10-06 | Payer: MEDICARE

## 2020-10-06 DIAGNOSIS — E039 Hypothyroidism, unspecified: Secondary | ICD-10-CM

## 2020-10-06 DIAGNOSIS — R4189 Other symptoms and signs involving cognitive functions and awareness: Secondary | ICD-10-CM

## 2020-10-06 DIAGNOSIS — E119 Type 2 diabetes mellitus without complications: Secondary | ICD-10-CM

## 2020-10-06 DIAGNOSIS — G479 Sleep disorder, unspecified: Secondary | ICD-10-CM

## 2020-10-06 DIAGNOSIS — K219 Gastro-esophageal reflux disease without esophagitis: Secondary | ICD-10-CM

## 2020-10-06 DIAGNOSIS — M1990 Unspecified osteoarthritis, unspecified site: Secondary | ICD-10-CM

## 2020-10-06 DIAGNOSIS — J40 Bronchitis, not specified as acute or chronic: Secondary | ICD-10-CM

## 2020-10-06 DIAGNOSIS — R911 Solitary pulmonary nodule: Secondary | ICD-10-CM

## 2020-10-06 DIAGNOSIS — M069 Rheumatoid arthritis, unspecified: Secondary | ICD-10-CM

## 2020-10-06 DIAGNOSIS — E669 Obesity, unspecified: Secondary | ICD-10-CM

## 2020-10-06 DIAGNOSIS — R29898 Other symptoms and signs involving the musculoskeletal system: Secondary | ICD-10-CM

## 2020-10-06 DIAGNOSIS — M4802 Spinal stenosis, cervical region: Secondary | ICD-10-CM

## 2020-10-06 DIAGNOSIS — M48061 Spinal stenosis, lumbar region without neurogenic claudication: Secondary | ICD-10-CM

## 2020-10-06 DIAGNOSIS — R32 Unspecified urinary incontinence: Secondary | ICD-10-CM

## 2020-10-06 DIAGNOSIS — R54 Age-related physical debility: Secondary | ICD-10-CM

## 2020-10-06 DIAGNOSIS — G629 Polyneuropathy, unspecified: Secondary | ICD-10-CM

## 2020-10-06 DIAGNOSIS — R413 Other amnesia: Secondary | ICD-10-CM

## 2020-10-06 DIAGNOSIS — R569 Unspecified convulsions: Secondary | ICD-10-CM

## 2020-10-06 DIAGNOSIS — J45909 Unspecified asthma, uncomplicated: Secondary | ICD-10-CM

## 2020-10-06 DIAGNOSIS — F3341 Major depressive disorder, recurrent, in partial remission: Secondary | ICD-10-CM

## 2020-10-06 DIAGNOSIS — K5790 Diverticulosis of intestine, part unspecified, without perforation or abscess without bleeding: Secondary | ICD-10-CM

## 2020-10-06 DIAGNOSIS — E7849 Other hyperlipidemia: Secondary | ICD-10-CM

## 2020-10-06 DIAGNOSIS — E034 Atrophy of thyroid (acquired): Secondary | ICD-10-CM

## 2020-10-06 DIAGNOSIS — G47419 Narcolepsy without cataplexy: Secondary | ICD-10-CM

## 2020-10-06 DIAGNOSIS — E1165 Type 2 diabetes mellitus with hyperglycemia: Secondary | ICD-10-CM

## 2020-10-06 DIAGNOSIS — G4733 Obstructive sleep apnea (adult) (pediatric): Secondary | ICD-10-CM

## 2020-10-06 DIAGNOSIS — J309 Allergic rhinitis, unspecified: Secondary | ICD-10-CM

## 2020-10-06 DIAGNOSIS — E785 Hyperlipidemia, unspecified: Secondary | ICD-10-CM

## 2020-10-06 DIAGNOSIS — K760 Fatty (change of) liver, not elsewhere classified: Secondary | ICD-10-CM

## 2020-10-06 DIAGNOSIS — R3989 Other symptoms and signs involving the genitourinary system: Secondary | ICD-10-CM

## 2020-10-06 DIAGNOSIS — H547 Unspecified visual loss: Secondary | ICD-10-CM

## 2020-10-06 DIAGNOSIS — U071 COVID-19 virus infection: Secondary | ICD-10-CM

## 2020-10-06 LAB — BNP POC ER: Lab: <15 pg/mL (ref 0–100)

## 2020-10-06 LAB — COMPREHENSIVE METABOLIC PANEL
Lab: 134 MMOL/L — ABNORMAL LOW (ref 137–147)
Lab: 8 10*3/uL (ref 3–12)
Lab: >60 mL/min (ref >60)
Lab: >60 mL/min — ABNORMAL HIGH (ref >60)

## 2020-10-06 LAB — URINALYSIS MICROSCOPIC REFLEX TO CULTURE

## 2020-10-06 LAB — CBC AND DIFF
Lab: 0 10*3/uL (ref 0–0.20)
Lab: 0.2 10*3/uL (ref 0–0.45)
Lab: 6.3 10*3/uL (ref 4.5–11.0)

## 2020-10-06 LAB — URINALYSIS DIPSTICK REFLEX TO CULTURE
Lab: NEGATIVE MMOL/L (ref 21–30)
Lab: NEGATIVE U/L — ABNORMAL HIGH (ref 7–40)
Lab: NEGATIVE g/dL (ref 3.5–5.0)
Lab: NEGATIVE g/dL (ref 6.0–8.0)
Lab: NEGATIVE mg/dL (ref 0.3–1.2)
Lab: POSITIVE U/L — AB (ref 7–56)

## 2020-10-06 LAB — POC GLUCOSE: Lab: 122 mg/dL — ABNORMAL HIGH (ref 70–100)

## 2020-10-06 LAB — POC LACTATE: Lab: 1.3 MMOL/L (ref 0.5–2.0)

## 2020-10-06 LAB — POC TROPONIN: Lab: 0 ng/mL (ref 0.00–0.05)

## 2020-10-06 LAB — POC CREATININE, RAD: Lab: 0.7 mg/dL (ref 0.4–1.00)

## 2020-10-06 MED ADMIN — ROSUVASTATIN 20 MG PO TAB [88504]: 20 mg | ORAL | @ 14:00:00 | NDC 00904678061

## 2020-10-06 MED ADMIN — OXYCODONE 5 MG PO TAB [10814]: 5 mg | ORAL | @ 14:00:00 | Stop: 2020-10-06 | NDC 00904696661

## 2020-10-06 MED ADMIN — LORATADINE 10 MG PO TAB [10466]: 10 mg | ORAL | @ 14:00:00 | NDC 00904685261

## 2020-10-06 MED ADMIN — INSULIN GLARGINE 100 UNIT/ML (3 ML) SC INJ PEN [163596]: 110 [IU] | SUBCUTANEOUS | @ 06:00:00 | Stop: 2020-10-06 | NDC 00088221901

## 2020-10-07 MED ADMIN — INSULIN GLARGINE 100 UNIT/ML (3 ML) SC INJ PEN [163596]: 44 [IU] | SUBCUTANEOUS | @ 14:00:00 | Stop: 2020-10-08 | NDC 00088221905

## 2020-10-07 MED ADMIN — PREDNISONE 5 MG PO TAB [6497]: 20 mg | ORAL | @ 14:00:00 | Stop: 2020-10-08 | NDC 00054872425

## 2020-10-07 MED ADMIN — INSULIN ASPART 100 UNIT/ML SC FLEXPEN [87504]: 6 [IU] | SUBCUTANEOUS | @ 19:00:00 | Stop: 2020-10-08 | NDC 00169633910

## 2020-10-07 MED ADMIN — ROSUVASTATIN 20 MG PO TAB [88504]: 20 mg | ORAL | @ 14:00:00 | Stop: 2020-10-08 | NDC 00904678061

## 2020-10-07 MED ADMIN — PRAMIPEXOLE 0.25 MG PO TAB [81284]: 0.75 mg | ORAL | @ 20:00:00 | Stop: 2020-10-08 | NDC 13668009290

## 2020-10-07 MED ADMIN — ACETAMINOPHEN 500 MG PO TAB [102]: 1000 mg | ORAL | @ 02:00:00 | NDC 00904673061

## 2020-10-07 MED ADMIN — LORATADINE 10 MG PO TAB [10466]: 10 mg | ORAL | @ 14:00:00 | Stop: 2020-10-08 | NDC 00904685261

## 2020-10-07 MED ADMIN — SENNOSIDES 8.6 MG PO TAB [11349]: 2 | ORAL | @ 19:00:00 | Stop: 2020-10-08 | NDC 49483008001

## 2020-10-07 MED ADMIN — ACETAMINOPHEN 500 MG PO TAB [102]: 1000 mg | ORAL | @ 09:00:00 | Stop: 2020-10-08 | NDC 00904673061

## 2020-10-07 MED ADMIN — PREDNISONE 5 MG PO TAB [6497]: 20 mg | ORAL | @ 02:00:00 | NDC 00054872425

## 2020-10-08 ENCOUNTER — Encounter: Admit: 2020-10-08 | Discharge: 2020-10-08 | Payer: MEDICARE

## 2020-10-10 ENCOUNTER — Encounter: Admit: 2020-10-10 | Discharge: 2020-10-10 | Payer: MEDICARE

## 2020-10-10 DIAGNOSIS — M069 Rheumatoid arthritis, unspecified: Secondary | ICD-10-CM

## 2020-10-19 ENCOUNTER — Encounter: Admit: 2020-10-19 | Discharge: 2020-10-19 | Payer: MEDICARE

## 2020-10-19 ENCOUNTER — Ambulatory Visit: Admit: 2020-10-19 | Discharge: 2020-10-19 | Payer: MEDICARE

## 2020-10-19 DIAGNOSIS — F341 Dysthymic disorder: Secondary | ICD-10-CM

## 2020-10-19 DIAGNOSIS — F411 Generalized anxiety disorder: Secondary | ICD-10-CM

## 2020-10-19 DIAGNOSIS — F410 Panic disorder [episodic paroxysmal anxiety] without agoraphobia: Secondary | ICD-10-CM

## 2020-10-19 DIAGNOSIS — F445 Conversion disorder with seizures or convulsions: Secondary | ICD-10-CM

## 2020-10-19 MED ORDER — VENLAFAXINE 75 MG PO CP24
75 mg | ORAL_CAPSULE | Freq: Every day | ORAL | 1 refills | Status: AC
Start: 2020-10-19 — End: ?

## 2020-10-19 NOTE — Patient Instructions
Recognizing Suicide Warning Signs in Yourself     People who are thinking about suicide may not know they are depressed. Certain thoughts, feelings, and actions can be signals that let you know you may need help. The best thing you can do is watch for signs that you may be at risk. Then, ask for help. You can talk with your regular healthcare provider or get help from a mental health provider.   Depression  Depression is a treatable illness, just like diabetes or heart disease. And like those illnesses, depression is not something that you can just snap out of. To feel better, treatment is needed before depression gets to a point that it can endanger your life. To know if depression is causing you to feel like ending your life, ask yourself:   ? Do I feel worthless, guilty, helpless, or hopeless?  ? Have I been feeling sad, down, or blue on most days?  ? Have I lost interest in my work or people I used to enjoy?  ? Do I have trouble sleeping or do I sleep too much?  ? Do I eat more or less than normal?  ? Do I feel tired, weak, and low on energy?  ? Do I feel restless and unable to sit still?  ? Do I have trouble thinking or making choices?  ? Do I cry more than normal?  ? Do I feel life isn't worth living?  Warning signs for suicide  Call your healthcare provider or get help right away if you have any of the warning signs below. You can also call a mental health clinic or a 24-hour suicide crisis hotline for help and support. Warning signs for suicide include:   ? Thinking often about taking your life  ? Planning how you may attempt it  ? Talking or writing about committing suicide  ? Feeling that death is the only solution to your problems  ? Feeling a pressing need to make out your will or arrange your funeral  ? Giving away things you own  ? Taking part in risky behaviors, such as having sex with someone you don't know, or drinking and driving  ? Buying a lethal weapon, such as a gun, or hoarding medicines that could be used in an overdose  Call 911  If you are in immediate risk of harming yourself, call 911  To learn more  For more information about depression and suicide prevention:   ? [employer] of Mental Health (248)280-3505  ? National Suicide Prevention Lifeline 254-797-0506 (800-273-TALK) www.suicidepreventionlifeline.org  ? [employer] on Mental Illness 800-950-6264www.nami.org  ? Mental Health America800-969-6642www.nmha.org  ? National Suicide Hotline800-4087365016 (800-SUICIDE)  StayWell last reviewed this educational content on 04/23/2019  ? 2000-2021 [employer], [place]. All rights reserved. This information is not intended as a substitute for professional medical care. Always follow your healthcare professional's instructions.        Local Warm Line (not for crisis):  [facility]            phone: 909-474-7130 or 430-480-1409 (toll free)  [facility]                         4:00pm - 10:00pm every evening    Local Crisis Lines:      Name: [facility] [place] Crisis Line 24/7                phone:  913-831-2550      Name: [place] 24/7                phone:    [phone]      Name: [place] [facility] Crisis Line 24/7            phone:    1-888-279-8188    National Suicide Prevention Lifelines & Textlines:      Dial 1-800-273-8255 or 1-800-SUICIDE (784-2433)                    Espa?ol: 1-888-628-9454     or chat: https://suicidepreventionlifeline.org/chat/     or text:  National Crisis Text Line - Text HOME to 741741     or text:  [facility] - Text HOPE to 20121    Local emergency services:       [facility] - Emergency Department            [address]      [place], [place] [address]                                 phone: [phone]    Or call 911

## 2020-10-24 ENCOUNTER — Encounter: Admit: 2020-10-24 | Discharge: 2020-10-24 | Payer: MEDICARE

## 2020-10-25 ENCOUNTER — Encounter: Admit: 2020-10-25 | Discharge: 2020-10-25 | Payer: MEDICARE

## 2020-10-25 ENCOUNTER — Ambulatory Visit: Admit: 2020-10-25 | Discharge: 2020-10-26 | Payer: MEDICARE

## 2020-10-25 DIAGNOSIS — R32 Unspecified urinary incontinence: Secondary | ICD-10-CM

## 2020-10-25 DIAGNOSIS — R911 Solitary pulmonary nodule: Secondary | ICD-10-CM

## 2020-10-25 DIAGNOSIS — R3989 Other symptoms and signs involving the genitourinary system: Secondary | ICD-10-CM

## 2020-10-25 DIAGNOSIS — E785 Hyperlipidemia, unspecified: Secondary | ICD-10-CM

## 2020-10-25 DIAGNOSIS — M1990 Unspecified osteoarthritis, unspecified site: Secondary | ICD-10-CM

## 2020-10-25 DIAGNOSIS — R569 Unspecified convulsions: Secondary | ICD-10-CM

## 2020-10-25 DIAGNOSIS — K760 Fatty (change of) liver, not elsewhere classified: Secondary | ICD-10-CM

## 2020-10-25 DIAGNOSIS — U071 COVID-19 virus infection: Secondary | ICD-10-CM

## 2020-10-25 DIAGNOSIS — J40 Bronchitis, not specified as acute or chronic: Secondary | ICD-10-CM

## 2020-10-25 DIAGNOSIS — G4733 Obstructive sleep apnea (adult) (pediatric): Secondary | ICD-10-CM

## 2020-10-25 DIAGNOSIS — F3341 Major depressive disorder, recurrent, in partial remission: Secondary | ICD-10-CM

## 2020-10-25 DIAGNOSIS — E034 Atrophy of thyroid (acquired): Secondary | ICD-10-CM

## 2020-10-25 DIAGNOSIS — R413 Other amnesia: Secondary | ICD-10-CM

## 2020-10-25 DIAGNOSIS — R29898 Other symptoms and signs involving the musculoskeletal system: Secondary | ICD-10-CM

## 2020-10-25 DIAGNOSIS — K219 Gastro-esophageal reflux disease without esophagitis: Secondary | ICD-10-CM

## 2020-10-25 DIAGNOSIS — H547 Unspecified visual loss: Secondary | ICD-10-CM

## 2020-10-25 DIAGNOSIS — G479 Sleep disorder, unspecified: Secondary | ICD-10-CM

## 2020-10-25 DIAGNOSIS — G629 Polyneuropathy, unspecified: Secondary | ICD-10-CM

## 2020-10-25 DIAGNOSIS — M48061 Spinal stenosis, lumbar region without neurogenic claudication: Secondary | ICD-10-CM

## 2020-10-25 DIAGNOSIS — R4189 Other symptoms and signs involving cognitive functions and awareness: Secondary | ICD-10-CM

## 2020-10-25 DIAGNOSIS — J45909 Unspecified asthma, uncomplicated: Secondary | ICD-10-CM

## 2020-10-25 DIAGNOSIS — E7849 Other hyperlipidemia: Secondary | ICD-10-CM

## 2020-10-25 DIAGNOSIS — E1165 Type 2 diabetes mellitus with hyperglycemia: Secondary | ICD-10-CM

## 2020-10-25 DIAGNOSIS — R54 Age-related physical debility: Secondary | ICD-10-CM

## 2020-10-25 DIAGNOSIS — M069 Rheumatoid arthritis, unspecified: Secondary | ICD-10-CM

## 2020-10-25 DIAGNOSIS — G47419 Narcolepsy without cataplexy: Secondary | ICD-10-CM

## 2020-10-25 DIAGNOSIS — M4802 Spinal stenosis, cervical region: Secondary | ICD-10-CM

## 2020-10-25 DIAGNOSIS — E039 Hypothyroidism, unspecified: Secondary | ICD-10-CM

## 2020-10-25 DIAGNOSIS — J309 Allergic rhinitis, unspecified: Secondary | ICD-10-CM

## 2020-10-25 DIAGNOSIS — K5790 Diverticulosis of intestine, part unspecified, without perforation or abscess without bleeding: Secondary | ICD-10-CM

## 2020-10-25 DIAGNOSIS — E669 Obesity, unspecified: Secondary | ICD-10-CM

## 2020-10-25 DIAGNOSIS — E119 Type 2 diabetes mellitus without complications: Secondary | ICD-10-CM

## 2020-10-25 MED ORDER — METHYLPREDNISOLONE ACETATE 80 MG/ML IJ SUSP
80 mg | Freq: Once | INTRAMUSCULAR | 0 refills | Status: CP
Start: 2020-10-25 — End: ?
  Administered 2020-10-25: 19:00:00 80 mg via INTRAMUSCULAR

## 2020-10-25 MED ORDER — FLU VACC QS2021-22(65YR UP)-PF 240 MCG/0.7 ML IM SYRG
.7 mL | Freq: Once | INTRAMUSCULAR | 0 refills | Status: CP
Start: 2020-10-25 — End: ?
  Administered 2020-10-25: 19:00:00 0.7 mL via INTRAMUSCULAR

## 2020-10-26 ENCOUNTER — Encounter: Admit: 2020-10-26 | Discharge: 2020-10-26 | Payer: MEDICARE

## 2020-10-26 DIAGNOSIS — Z23 Encounter for immunization: Secondary | ICD-10-CM

## 2020-11-02 ENCOUNTER — Encounter: Admit: 2020-11-02 | Discharge: 2020-11-02 | Payer: MEDICARE

## 2020-11-02 NOTE — Telephone Encounter
Standing lab orders faxed per provider's request.

## 2020-11-02 NOTE — Telephone Encounter
-----   Message from Raynelle Jan, APRN-NP sent at 10/26/2020  1:13 PM CDT -----  Regarding: Lab orders  ,    I saw Hailey Rodgers yesterday. She is a patient who was recently in the hospital and saw Dr. Waynard Edwards, and she is scheduled to follow up with him in January. I just ordered methotrexate for her, and discussed with her that she will need to have labs done once a month for the first 3 months. Dr. Waynard Edwards said he put orders in for standing monitoring labs. The patient lives near [place] and wants to have her labs drawn and [facility] in [place]. Their phone number is [phone]. Can you please send the lab orders to them so she will be able to get them when she needs to?     Thank you,    Manuela Schwartz

## 2020-11-08 ENCOUNTER — Encounter: Admit: 2020-11-08 | Discharge: 2020-11-08 | Payer: MEDICARE

## 2020-11-09 ENCOUNTER — Emergency Department: Admit: 2020-11-09 | Discharge: 2020-11-09 | Payer: MEDICARE

## 2020-11-09 ENCOUNTER — Emergency Department: Admit: 2020-11-09 | Discharge: 2020-11-08 | Payer: MEDICARE

## 2020-11-09 ENCOUNTER — Inpatient Hospital Stay: Admit: 2020-11-09 | Discharge: 2020-11-09 | Payer: MEDICARE

## 2020-11-09 ENCOUNTER — Encounter: Admit: 2020-11-09 | Discharge: 2020-11-09 | Payer: MEDICARE

## 2020-11-09 MED ADMIN — LEVOTHYROXINE 88 MCG PO TAB [10403]: 88 ug | ORAL | @ 16:00:00 | NDC 42292003801

## 2020-11-09 MED ADMIN — OXYCODONE-ACETAMINOPHEN 10-325 MG PO TAB [31864]: 1 | ORAL | @ 16:00:00 | NDC 68308048047

## 2020-11-09 MED ADMIN — GABAPENTIN 100 MG PO CAP [18309]: 200 mg | ORAL | @ 23:00:00 | NDC 67877022205

## 2020-11-09 MED ADMIN — FENTANYL CITRATE (PF) 50 MCG/ML IJ SOLN [3037]: 50 ug | INTRAVENOUS | @ 07:00:00 | Stop: 2020-11-09 | NDC 63323080612

## 2020-11-09 MED ADMIN — OXYCODONE-ACETAMINOPHEN 10-325 MG PO TAB [31864]: 1 | ORAL | @ 22:00:00 | NDC 68084071011

## 2020-11-09 MED ADMIN — LORATADINE 10 MG PO TAB [10466]: 10 mg | ORAL | @ 16:00:00 | NDC 00904685261

## 2020-11-09 MED ADMIN — FLUOXETINE 10 MG PO CAP [10069]: 40 mg | ORAL | @ 16:00:00 | NDC 00904578461

## 2020-11-09 MED ADMIN — SODIUM CHLORIDE 0.9 % IJ SOLN [7319]: 50 mL | INTRAVENOUS | @ 11:00:00 | Stop: 2020-11-09 | NDC 00409488820

## 2020-11-09 MED ADMIN — PANTOPRAZOLE 40 MG PO TBEC [80436]: 40 mg | ORAL | @ 16:00:00 | NDC 00904647461

## 2020-11-09 MED ADMIN — LIDOCAINE 5 % TP PTMD [80759]: 1 | TOPICAL | @ 16:00:00 | NDC 00591352530

## 2020-11-09 MED ADMIN — VENLAFAXINE 75 MG PO CP24 [77315]: 75 mg | ORAL | @ 16:00:00 | NDC 00904646961

## 2020-11-09 MED ADMIN — CALCIUM CARBONATE 500 MG CALCIUM (1,250 MG) PO TAB [1300]: 1250 mg | ORAL | @ 16:00:00 | NDC 00904188361

## 2020-11-09 MED ADMIN — PRAMIPEXOLE 0.25 MG PO TAB [81284]: 0.75 mg | ORAL | @ 16:00:00 | NDC 00904670461

## 2020-11-09 MED ADMIN — IOHEXOL 350 MG IODINE/ML IV SOLN [81210]: 70 mL | INTRAVENOUS | @ 11:00:00 | Stop: 2020-11-09 | NDC 00407141491

## 2020-11-09 MED ADMIN — METHOCARBAMOL 750 MG PO TAB [4972]: 750 mg | ORAL | @ 13:00:00 | Stop: 2020-11-09 | NDC 60687056811

## 2020-11-09 MED ADMIN — INSULIN ASPART 100 UNIT/ML SC FLEXPEN [87504]: 0 [IU] | SUBCUTANEOUS | @ 16:00:00 | NDC 00169633910

## 2020-11-09 MED ADMIN — FOLIC ACID 1 MG PO TAB [3233]: 1 mg | ORAL | @ 16:00:00 | NDC 62584089711

## 2020-11-09 MED ADMIN — OXYBUTYNIN CHLORIDE 10 MG PO TR24 [77806]: 10 mg | ORAL | @ 16:00:00 | NDC 27241015604

## 2020-11-09 MED ADMIN — DICYCLOMINE 10 MG PO CAP [2418]: 10 mg | ORAL | @ 16:00:00 | NDC 60687036911

## 2020-11-09 MED ADMIN — PREDNISONE 1 MG PO TAB [6493]: 5 mg | ORAL | @ 16:00:00 | Stop: 2020-11-10 | NDC 00054873925

## 2020-11-09 MED ADMIN — VITAMIN E (DL, ACETATE) 180 MG (400 UNIT) PO CAP [171306]: 400 [IU] | ORAL | @ 16:00:00 | NDC 77333095125

## 2020-11-09 MED ADMIN — FENTANYL CITRATE (PF) 50 MCG/ML IJ SOLN [3037]: 25 ug | INTRAVENOUS | @ 10:00:00 | Stop: 2020-11-09 | NDC 63323080612

## 2020-11-09 MED ADMIN — DICYCLOMINE 10 MG PO CAP [2418]: 10 mg | ORAL | @ 22:00:00 | NDC 60687036911

## 2020-11-09 NOTE — ED Notes
Report given to [facility], RN. Care of pt transferred at this time.

## 2020-11-09 NOTE — ED Notes
This RN received report from Rome, Therapist, sports. Pt alert & oriented x4, airway is patent, breathing is unlabored, & is resting at this time.

## 2020-11-10 ENCOUNTER — Inpatient Hospital Stay: Admit: 2020-11-10 | Discharge: 2020-11-10 | Payer: MEDICARE

## 2020-11-10 MED ADMIN — ASPIRIN 81 MG PO CHEW [680]: 81 mg | ORAL | @ 04:00:00 | NDC 66553000201

## 2020-11-10 MED ADMIN — PANTOPRAZOLE 40 MG PO TBEC [80436]: 40 mg | ORAL | @ 04:00:00 | NDC 00904647461

## 2020-11-10 MED ADMIN — CALCIUM CARBONATE-VITAMIN D3 500 MG(1,250MG) -200 UNIT PO TAB [19483]: 1 | ORAL | @ 16:00:00 | Stop: 2020-11-12 | NDC 10006070038

## 2020-11-10 MED ADMIN — METHOTREXATE SODIUM 2.5 MG PO TAB [4973]: 15 mg | ORAL | @ 16:00:00 | Stop: 2020-11-12 | NDC 51079067001

## 2020-11-10 MED ADMIN — ENOXAPARIN 40 MG/0.4 ML SC SYRG [85052]: 40 mg | SUBCUTANEOUS | @ 04:00:00 | NDC 00781324602

## 2020-11-10 MED ADMIN — OMEGA 3-DHA-EPA-FISH OIL 300-1,000 MG PO CPDR [306443]: 1000 mg | ORAL | @ 04:00:00 | NDC 79854006900

## 2020-11-10 MED ADMIN — DICYCLOMINE 10 MG PO CAP [2418]: 10 mg | ORAL | @ 23:00:00 | Stop: 2020-11-12 | NDC 60687036911

## 2020-11-10 MED ADMIN — DICYCLOMINE 10 MG PO CAP [2418]: 10 mg | ORAL | @ 19:00:00 | Stop: 2020-11-12 | NDC 60687036911

## 2020-11-10 MED ADMIN — OXYBUTYNIN CHLORIDE 10 MG PO TR24 [77806]: 10 mg | ORAL | @ 16:00:00 | Stop: 2020-11-12 | NDC 27241015604

## 2020-11-10 MED ADMIN — CALCIUM CARBONATE 500 MG CALCIUM (1,250 MG) PO TAB [1300]: 1250 mg | ORAL | @ 04:00:00 | NDC 00904188361

## 2020-11-10 MED ADMIN — PANTOPRAZOLE 40 MG PO TBEC [80436]: 40 mg | ORAL | @ 16:00:00 | Stop: 2020-11-12 | NDC 00904647461

## 2020-11-10 MED ADMIN — SENNOSIDES-DOCUSATE SODIUM 8.6-50 MG PO TAB [40926]: 2 | ORAL | @ 18:00:00 | Stop: 2020-11-12 | NDC 60687062211

## 2020-11-10 MED ADMIN — INSULIN GLARGINE 100 UNIT/ML (3 ML) SC INJ PEN [163596]: 70 [IU] | SUBCUTANEOUS | @ 04:00:00 | NDC 00088221905

## 2020-11-10 MED ADMIN — ACETAMINOPHEN 500 MG PO TAB [102]: 1000 mg | ORAL | @ 16:00:00 | Stop: 2020-11-12 | NDC 00904673061

## 2020-11-10 MED ADMIN — VENLAFAXINE 75 MG PO CP24 [77315]: 75 mg | ORAL | @ 16:00:00 | Stop: 2020-11-12 | NDC 00904646961

## 2020-11-10 MED ADMIN — GABAPENTIN 100 MG PO CAP [18309]: 200 mg | ORAL | @ 04:00:00 | NDC 67877022205

## 2020-11-10 MED ADMIN — PRAMIPEXOLE 0.25 MG PO TAB [81284]: 0.75 mg | ORAL | @ 04:00:00 | NDC 00904670461

## 2020-11-10 MED ADMIN — PREDNISONE 20 MG PO TAB [6496]: 20 mg | ORAL | @ 04:00:00 | Stop: 2020-11-12 | NDC 00054001820

## 2020-11-10 MED ADMIN — LORATADINE 10 MG PO TAB [10466]: 10 mg | ORAL | @ 16:00:00 | Stop: 2020-11-12 | NDC 00904685261

## 2020-11-10 MED ADMIN — ACETAMINOPHEN 500 MG PO TAB [102]: 1000 mg | ORAL | @ 23:00:00 | Stop: 2020-11-12 | NDC 00904673061

## 2020-11-10 MED ADMIN — CELECOXIB 100 MG PO CAP [82263]: 100 mg | ORAL | @ 04:00:00 | NDC 00904650261

## 2020-11-10 MED ADMIN — PERFLUTREN LIPID MICROSPHERES 1.1 MG/ML IV SUSP [79178]: 1 mL | INTRAVENOUS | @ 22:00:00 | Stop: 2020-11-10 | NDC 11994001116

## 2020-11-10 MED ADMIN — DICYCLOMINE 10 MG PO CAP [2418]: 10 mg | ORAL | @ 16:00:00 | Stop: 2020-11-12 | NDC 60687036911

## 2020-11-10 MED ADMIN — PRAMIPEXOLE 0.25 MG PO TAB [81284]: 0.75 mg | ORAL | @ 16:00:00 | Stop: 2020-11-12 | NDC 00904670461

## 2020-11-10 MED ADMIN — GABAPENTIN 100 MG PO CAP [18309]: 200 mg | ORAL | @ 12:00:00 | Stop: 2020-11-12 | NDC 67877022205

## 2020-11-10 MED ADMIN — LIDOCAINE 5 % TP PTMD [80759]: 1 | TOPICAL | @ 16:00:00 | Stop: 2020-11-12 | NDC 00591352530

## 2020-11-10 MED ADMIN — VITAMIN E (DL, ACETATE) 180 MG (400 UNIT) PO CAP [171306]: 400 [IU] | ORAL | @ 16:00:00 | Stop: 2020-11-12 | NDC 77333095125

## 2020-11-10 MED ADMIN — MONTELUKAST 10 MG PO TAB [81627]: 10 mg | ORAL | @ 04:00:00 | NDC 00904680861

## 2020-11-10 MED ADMIN — OXYCODONE-ACETAMINOPHEN 10-325 MG PO TAB [31864]: 1 | ORAL | @ 12:00:00 | Stop: 2020-11-10 | NDC 68308048047

## 2020-11-10 MED ADMIN — LEVOTHYROXINE 88 MCG PO TAB [10403]: 88 ug | ORAL | @ 18:00:00 | Stop: 2020-11-12 | NDC 42292003801

## 2020-11-10 MED ADMIN — FOLIC ACID 1 MG PO TAB [3233]: 1 mg | ORAL | @ 16:00:00 | Stop: 2020-11-12 | NDC 62584089711

## 2020-11-10 MED ADMIN — GABAPENTIN 100 MG PO CAP [18309]: 200 mg | ORAL | @ 19:00:00 | Stop: 2020-11-12 | NDC 67877022205

## 2020-11-10 MED ADMIN — POLYETHYLENE GLYCOL 3350 17 GRAM PO PWPK [25424]: 17 g | ORAL | @ 19:00:00 | Stop: 2020-11-12 | NDC 00904693186

## 2020-11-10 MED ADMIN — PREDNISONE 20 MG PO TAB [6496]: 20 mg | ORAL | @ 16:00:00 | Stop: 2020-11-10 | NDC 00054001820

## 2020-11-10 MED ADMIN — DICYCLOMINE 10 MG PO CAP [2418]: 10 mg | ORAL | @ 04:00:00 | NDC 60687036911

## 2020-11-10 MED ADMIN — MULTIVITAMIN PO TAB [37167]: 1 | ORAL | @ 04:00:00 | NDC 00904053061

## 2020-11-10 MED ADMIN — FLUOXETINE 20 MG PO CAP [10070]: 40 mg | ORAL | @ 16:00:00 | Stop: 2020-11-12 | NDC 00904578561

## 2020-11-10 MED ADMIN — FENOFIBRATE NANOCRYSTALLIZED 145 MG PO TAB [129618]: 145 mg | ORAL | @ 04:00:00 | NDC 51079060801

## 2020-11-10 MED ADMIN — ROSUVASTATIN 20 MG PO TAB [88504]: 20 mg | ORAL | @ 22:00:00 | Stop: 2020-11-12 | NDC 00904678061

## 2020-11-11 ENCOUNTER — Inpatient Hospital Stay: Admit: 2020-11-11 | Discharge: 2020-11-11 | Payer: MEDICARE

## 2020-11-11 ENCOUNTER — Encounter: Admit: 2020-11-11 | Discharge: 2020-11-11 | Payer: MEDICARE

## 2020-11-11 MED ADMIN — PRAMIPEXOLE 0.25 MG PO TAB [81284]: 0.75 mg | ORAL | @ 03:00:00 | Stop: 2020-11-12 | NDC 00904670461

## 2020-11-11 MED ADMIN — LIDOCAINE 5 % TP PTMD [80759]: 1 | TOPICAL | @ 16:00:00 | Stop: 2020-11-12 | NDC 00591352530

## 2020-11-11 MED ADMIN — PANTOPRAZOLE 40 MG PO TBEC [80436]: 40 mg | ORAL | @ 16:00:00 | Stop: 2020-11-12 | NDC 00904647461

## 2020-11-11 MED ADMIN — MONTELUKAST 10 MG PO TAB [81627]: 10 mg | ORAL | @ 03:00:00 | Stop: 2020-11-12 | NDC 00904680861

## 2020-11-11 MED ADMIN — PREDNISONE 10 MG PO TAB [6494]: 30 mg | ORAL | @ 16:00:00 | Stop: 2020-11-12 | NDC 00054001720

## 2020-11-11 MED ADMIN — PREDNISONE 10 MG PO TAB [6494]: 10 mg | ORAL | @ 01:00:00 | Stop: 2020-11-11 | NDC 00054001720

## 2020-11-11 MED ADMIN — ACETAMINOPHEN 500 MG PO TAB [102]: 1000 mg | ORAL | @ 20:00:00 | Stop: 2020-11-12 | NDC 00904673061

## 2020-11-11 MED ADMIN — CALCIUM CARBONATE-VITAMIN D3 500 MG(1,250MG) -200 UNIT PO TAB [19483]: 1 | ORAL | @ 03:00:00 | Stop: 2020-11-12 | NDC 10006070038

## 2020-11-11 MED ADMIN — VENLAFAXINE 75 MG PO CP24 [77315]: 75 mg | ORAL | @ 16:00:00 | Stop: 2020-11-12 | NDC 00904646961

## 2020-11-11 MED ADMIN — MULTIVITAMIN PO TAB [37167]: 1 | ORAL | @ 03:00:00 | Stop: 2020-11-12 | NDC 00904053061

## 2020-11-11 MED ADMIN — FOLIC ACID 1 MG PO TAB [3233]: 1 mg | ORAL | @ 16:00:00 | Stop: 2020-11-12 | NDC 62584089711

## 2020-11-11 MED ADMIN — PRAMIPEXOLE 0.25 MG PO TAB [81284]: 0.75 mg | ORAL | @ 16:00:00 | Stop: 2020-11-12 | NDC 00904670461

## 2020-11-11 MED ADMIN — SENNOSIDES-DOCUSATE SODIUM 8.6-50 MG PO TAB [40926]: 2 | ORAL | @ 16:00:00 | Stop: 2020-11-12 | NDC 60687062211

## 2020-11-11 MED ADMIN — FLUOXETINE 20 MG PO CAP [10070]: 40 mg | ORAL | @ 16:00:00 | Stop: 2020-11-12 | NDC 00904578561

## 2020-11-11 MED ADMIN — FENOFIBRATE NANOCRYSTALLIZED 145 MG PO TAB [129618]: 145 mg | ORAL | @ 03:00:00 | Stop: 2020-11-12 | NDC 51079060801

## 2020-11-11 MED ADMIN — LEVOTHYROXINE 88 MCG PO TAB [10403]: 88 ug | ORAL | @ 14:00:00 | Stop: 2020-11-12 | NDC 42292003801

## 2020-11-11 MED ADMIN — DICYCLOMINE 10 MG PO CAP [2418]: 10 mg | ORAL | @ 16:00:00 | Stop: 2020-11-12 | NDC 60687036911

## 2020-11-11 MED ADMIN — ACETAMINOPHEN 500 MG PO TAB [102]: 1000 mg | ORAL | @ 14:00:00 | Stop: 2020-11-12 | NDC 00904673061

## 2020-11-11 MED ADMIN — MELATONIN 5 MG PO TAB [168576]: 5 mg | ORAL | @ 03:00:00 | Stop: 2020-11-12 | NDC 77333052025

## 2020-11-11 MED ADMIN — DICYCLOMINE 10 MG PO CAP [2418]: 10 mg | ORAL | @ 20:00:00 | Stop: 2020-11-12 | NDC 60687036911

## 2020-11-11 MED ADMIN — ACETAMINOPHEN 500 MG PO TAB [102]: 1000 mg | ORAL | @ 07:00:00 | Stop: 2020-11-12 | NDC 00904673061

## 2020-11-11 MED ADMIN — DICYCLOMINE 10 MG PO CAP [2418]: 10 mg | ORAL | @ 03:00:00 | Stop: 2020-11-12 | NDC 60687036911

## 2020-11-11 MED ADMIN — ASPIRIN 81 MG PO CHEW [680]: 81 mg | ORAL | @ 03:00:00 | Stop: 2020-11-12 | NDC 66553000201

## 2020-11-11 MED ADMIN — LORATADINE 10 MG PO TAB [10466]: 10 mg | ORAL | @ 16:00:00 | Stop: 2020-11-12 | NDC 00904685261

## 2020-11-11 MED ADMIN — OXYBUTYNIN CHLORIDE 10 MG PO TR24 [77806]: 10 mg | ORAL | @ 16:00:00 | Stop: 2020-11-12 | NDC 27241015604

## 2020-11-11 MED ADMIN — VITAMIN E (DL, ACETATE) 180 MG (400 UNIT) PO CAP [171306]: 400 [IU] | ORAL | @ 16:00:00 | Stop: 2020-11-12 | NDC 77333095125

## 2020-11-11 MED ADMIN — ENOXAPARIN 40 MG/0.4 ML SC SYRG [85052]: 40 mg | SUBCUTANEOUS | @ 03:00:00 | Stop: 2020-11-12 | NDC 00781324602

## 2020-11-11 MED ADMIN — CALCIUM CARBONATE-VITAMIN D3 500 MG(1,250MG) -200 UNIT PO TAB [19483]: 1 | ORAL | @ 16:00:00 | Stop: 2020-11-12 | NDC 10006070038

## 2020-11-11 MED ADMIN — ROSUVASTATIN 20 MG PO TAB [88504]: 20 mg | ORAL | @ 16:00:00 | Stop: 2020-11-12 | NDC 00904678061

## 2020-11-11 MED ADMIN — POLYETHYLENE GLYCOL 3350 17 GRAM PO PWPK [25424]: 17 g | ORAL | @ 16:00:00 | Stop: 2020-11-12 | NDC 00904693186

## 2020-11-11 MED ADMIN — SENNOSIDES-DOCUSATE SODIUM 8.6-50 MG PO TAB [40926]: 2 | ORAL | @ 03:00:00 | Stop: 2020-11-12 | NDC 60687062211

## 2020-11-11 MED ADMIN — PANTOPRAZOLE 40 MG PO TBEC [80436]: 40 mg | ORAL | @ 03:00:00 | Stop: 2020-11-12 | NDC 00904647461

## 2020-11-11 MED ADMIN — OMEGA 3-DHA-EPA-FISH OIL 300-1,000 MG PO CPDR [306443]: 1000 mg | ORAL | @ 03:00:00 | Stop: 2020-11-12 | NDC 79854006900

## 2020-11-23 ENCOUNTER — Ambulatory Visit: Admit: 2020-11-23 | Discharge: 2020-11-24 | Payer: MEDICARE

## 2020-11-23 ENCOUNTER — Encounter: Admit: 2020-11-23 | Discharge: 2020-11-23 | Payer: MEDICARE

## 2020-11-23 DIAGNOSIS — R54 Age-related physical debility: Secondary | ICD-10-CM

## 2020-11-23 DIAGNOSIS — G4733 Obstructive sleep apnea (adult) (pediatric): Secondary | ICD-10-CM

## 2020-11-23 DIAGNOSIS — E039 Hypothyroidism, unspecified: Secondary | ICD-10-CM

## 2020-11-23 DIAGNOSIS — G479 Sleep disorder, unspecified: Secondary | ICD-10-CM

## 2020-11-23 DIAGNOSIS — M069 Rheumatoid arthritis, unspecified: Secondary | ICD-10-CM

## 2020-11-23 DIAGNOSIS — K760 Fatty (change of) liver, not elsewhere classified: Secondary | ICD-10-CM

## 2020-11-23 DIAGNOSIS — K219 Gastro-esophageal reflux disease without esophagitis: Secondary | ICD-10-CM

## 2020-11-23 DIAGNOSIS — E1165 Type 2 diabetes mellitus with hyperglycemia: Secondary | ICD-10-CM

## 2020-11-23 DIAGNOSIS — G47419 Narcolepsy without cataplexy: Secondary | ICD-10-CM

## 2020-11-23 DIAGNOSIS — R4189 Other symptoms and signs involving cognitive functions and awareness: Secondary | ICD-10-CM

## 2020-11-23 DIAGNOSIS — Z79899 Other long term (current) drug therapy: Secondary | ICD-10-CM

## 2020-11-23 DIAGNOSIS — R911 Solitary pulmonary nodule: Secondary | ICD-10-CM

## 2020-11-23 DIAGNOSIS — R569 Unspecified convulsions: Secondary | ICD-10-CM

## 2020-11-23 DIAGNOSIS — R3989 Other symptoms and signs involving the genitourinary system: Secondary | ICD-10-CM

## 2020-11-23 DIAGNOSIS — E669 Obesity, unspecified: Secondary | ICD-10-CM

## 2020-11-23 DIAGNOSIS — M1990 Unspecified osteoarthritis, unspecified site: Secondary | ICD-10-CM

## 2020-11-23 DIAGNOSIS — U071 COVID-19 virus infection: Secondary | ICD-10-CM

## 2020-11-23 DIAGNOSIS — F3341 Major depressive disorder, recurrent, in partial remission: Secondary | ICD-10-CM

## 2020-11-23 DIAGNOSIS — K5790 Diverticulosis of intestine, part unspecified, without perforation or abscess without bleeding: Secondary | ICD-10-CM

## 2020-11-23 DIAGNOSIS — E119 Type 2 diabetes mellitus without complications: Secondary | ICD-10-CM

## 2020-11-23 DIAGNOSIS — R748 Abnormal levels of other serum enzymes: Secondary | ICD-10-CM

## 2020-11-23 DIAGNOSIS — M48061 Spinal stenosis, lumbar region without neurogenic claudication: Secondary | ICD-10-CM

## 2020-11-23 DIAGNOSIS — R413 Other amnesia: Secondary | ICD-10-CM

## 2020-11-23 DIAGNOSIS — R32 Unspecified urinary incontinence: Secondary | ICD-10-CM

## 2020-11-23 DIAGNOSIS — E7849 Other hyperlipidemia: Secondary | ICD-10-CM

## 2020-11-23 DIAGNOSIS — J309 Allergic rhinitis, unspecified: Secondary | ICD-10-CM

## 2020-11-23 DIAGNOSIS — J40 Bronchitis, not specified as acute or chronic: Secondary | ICD-10-CM

## 2020-11-23 DIAGNOSIS — G629 Polyneuropathy, unspecified: Secondary | ICD-10-CM

## 2020-11-23 DIAGNOSIS — E785 Hyperlipidemia, unspecified: Secondary | ICD-10-CM

## 2020-11-23 DIAGNOSIS — E034 Atrophy of thyroid (acquired): Secondary | ICD-10-CM

## 2020-11-23 DIAGNOSIS — R29898 Other symptoms and signs involving the musculoskeletal system: Secondary | ICD-10-CM

## 2020-11-23 DIAGNOSIS — J45909 Unspecified asthma, uncomplicated: Secondary | ICD-10-CM

## 2020-11-23 DIAGNOSIS — M4802 Spinal stenosis, cervical region: Secondary | ICD-10-CM

## 2020-11-23 DIAGNOSIS — H547 Unspecified visual loss: Secondary | ICD-10-CM

## 2020-11-23 MED ORDER — METHOTREXATE SODIUM 2.5 MG PO TAB
15 mg | ORAL_TABLET | ORAL | 0 refills | Status: AC
Start: 2020-11-23 — End: ?

## 2020-11-23 NOTE — Patient Instructions
Lab tomorrow at [facility].   Labs again in one month.   Continue methotrexate 15 mg every 7 days.

## 2020-11-24 DIAGNOSIS — Z5181 Encounter for therapeutic drug level monitoring: Secondary | ICD-10-CM

## 2020-11-29 ENCOUNTER — Encounter: Admit: 2020-11-29 | Discharge: 2020-11-29 | Payer: MEDICARE

## 2020-12-01 ENCOUNTER — Encounter: Admit: 2020-12-01 | Discharge: 2020-12-01 | Payer: MEDICARE

## 2020-12-08 ENCOUNTER — Encounter: Admit: 2020-12-08 | Discharge: 2020-12-08 | Payer: MEDICARE

## 2020-12-08 MED ORDER — HUMIRA(CF) PEN 40 MG/0.4 ML SC PNKT
40 mg | PACK | SUBCUTANEOUS | 3 refills | Status: AC
Start: 2020-12-08 — End: ?
  Filled 2020-12-18: qty 1, 28d supply, fill #1

## 2020-12-08 NOTE — Telephone Encounter
Called patient. Counseled her to increase prednisone to 20 mg daily for 1 week, 15 mg daily for 1 week, 10 mg daily for 2 weeks, 5 mg daily for 2 weeks, and then stop. I am going to start her on Humira 40 mg SQ every 2 weeks. Order has been placed. She should continue with methotrexate 20 mg PO weekly with folic acid 1 mg daily. She will be getting her DMARD monitoring blood work next week.    Kathlee Nations,   Can you please educate patient further about administration of Humira? Her daughter is a Marine scientist who can help administer it.     Craige Cotta, MD  Rheumatology Fellow, PGY-5  Pager Number: [phone]

## 2020-12-08 NOTE — Telephone Encounter
Yes, as discussed, you may consider a TNF intermittent such as etanercept or adalimumab while on methotrexate.  You could also advise her to increase the dose of prednisone to 15 mg or up to 20 mg daily if needed with a goal of tapering it by 5 mg every 1 to 2 weeks down to 5 mg daily and remain on that dose until methotrexate becomes more effective and/or a second agent is started and then taper the prednisone off gradually.  Thanks

## 2020-12-08 NOTE — Telephone Encounter
Called patient. She is still having a significant amount of pain and swelling in her hands that is impairing her quality of life. She is going to physical therapy twice a week. She is on her second week of methotrexate 20 mg weekly. She has been on methotrexate for a total of 5 weeks. There is still time for the methotrexate to get into maximal effect. She is currently taking a half of a tablet of prednisone a day. Her medication list says she gets 5 mg tablet, but her rehab facility may have called her 10 mg tablets. I counseled her to take a whole tablet of prednisone daily for a week, and then half a tablet for 2 weeks and then stop. I think I am going to need to be aggressive in her to control her rheumatoid arthritis. She has called multiple times with RA flares. thomapI talked to her about biological medications and she is agreeable to it. She is negative for TB and her hepatitis screen is negative. I would like to start her on Humira 40 mg SQ every 2 weeks. The order has been placed.     Dr. Karle Plumber,   Is it fine for me to start her on Humira? I'm going to keep her on methotrexate 20 mg weekly with folic acid 1 mg daily as well.     Craige Cotta, MD  Rheumatology Fellow, PGY-5  Pager Number: [phone]

## 2020-12-10 ENCOUNTER — Encounter: Admit: 2020-12-10 | Discharge: 2020-12-10 | Payer: MEDICARE

## 2020-12-11 ENCOUNTER — Encounter: Admit: 2020-12-11 | Discharge: 2020-12-11 | Payer: MEDICARE

## 2020-12-11 NOTE — Progress Notes
Will call for adalimumab (Humira) education once medication approved by insurance

## 2020-12-11 NOTE — Telephone Encounter
Regarding: RA Question   ----- Message from Craige Cotta, MD sent at 12/08/2020  5:41 PM CST -----       ----- Message sent from Hollice Gong, RN to Callie Fielding S at 12/08/2020  8:28 AM -----   Kermit Balo morning Estill Bamberg,    I am sorry to hear this. I will forward your message to Dr. Waynard Edwards for review and recommendations. Thank you!    Tania      ----- Message -----       From:Jaine Verne Spurr       Sent:12/08/2020  1:10 AM CST         MP:NTIRW L [place] Silos, APRN-NP    Subject:RA Question     Hello,  I have some concerning questions.  Ive had 2 [facility] admissions and 2 Rehab admissions since my RA diagnosis in mid October.  I was told I will have to give this medication time to see if its going to work.  I started out on the Methatrexiate 7.5mg  X 2 weeks, then increased to 15mg  X 3 weeks, and then increased it to 20mg  last week.  Today (Friday) will be my 2nd week on 20mg .  Im currently on my Prednisone taper.  I feel Im starting to get worse again.  I dont feel like this medication is helping much.  Im still in physical therapy on an outpatient basis twice a week.  My shoulders, arms, hands, and knees are extremely painful along w/being edematous.  Donnald Garre been fighting this patiently waiting for the medication to kick in since October, but Im still waiting.  When do we make the decision to add to the regimen or try something different?  I know they done a lot of blood work up in the hospital last time and talked about biologic infusions.  Are we at that point now?  Im just very   miserable and its getting harder to be able to function to do things.  Please help me.  Thank you

## 2020-12-11 NOTE — Progress Notes
The Prior Authorization for Humira has been submitted for Hailey Rodgers via Cover My Meds.  Will continue to follow.    [place] Patient Advocate  [phone]

## 2020-12-13 ENCOUNTER — Encounter: Admit: 2020-12-13 | Discharge: 2020-12-13 | Payer: MEDICARE

## 2020-12-13 NOTE — Progress Notes
The Prior Authorization for Humira was approved for Hailey Rodgers  Until 12/22/21.  The copay is $0.00.  The PA authorization number is [facility].    The ambulatory pharmacist has been notified of the approval in order to provide education prior to dispense of the medication.  Will await notification from the pharmacist that it is OK to set up the fill per the patient's preferred delivery method.    [place] Patient Advocate  [phone]

## 2020-12-14 ENCOUNTER — Encounter: Admit: 2020-12-14 | Discharge: 2020-12-14 | Payer: MEDICARE

## 2020-12-14 ENCOUNTER — Ambulatory Visit: Admit: 2020-12-14 | Discharge: 2020-12-14 | Payer: MEDICARE

## 2020-12-14 DIAGNOSIS — H25813 Combined forms of age-related cataract, bilateral: Secondary | ICD-10-CM

## 2020-12-14 DIAGNOSIS — H5111 Convergence insufficiency: Secondary | ICD-10-CM

## 2020-12-14 DIAGNOSIS — Z79899 Other long term (current) drug therapy: Secondary | ICD-10-CM

## 2020-12-14 DIAGNOSIS — H5213 Myopia, bilateral: Secondary | ICD-10-CM

## 2020-12-14 DIAGNOSIS — H5052 Exophoria: Secondary | ICD-10-CM

## 2020-12-14 DIAGNOSIS — E113293 Type 2 diabetes mellitus with mild nonproliferative diabetic retinopathy without macular edema, bilateral: Secondary | ICD-10-CM

## 2020-12-14 LAB — CBC AND DIFF
Lab: 0 K/UL (ref 0–0.20)
Lab: 0.1 K/UL (ref 0–0.45)
Lab: 0.4 K/UL (ref 0–0.80)
Lab: 0.6 K/UL — ABNORMAL LOW (ref 1.0–4.8)
Lab: 1 % (ref 0–2)
Lab: 15 % — ABNORMAL HIGH (ref 11–15)
Lab: 2 % (ref 0–5)
Lab: 25 pg — ABNORMAL LOW (ref 26–34)
Lab: 3.9 M/UL — ABNORMAL LOW (ref 4.0–5.0)
Lab: 31 g/dL — ABNORMAL LOW (ref 32.0–36.0)
Lab: 32 % — ABNORMAL LOW (ref >60)
Lab: 4 % (ref 4–12)
Lab: 546 K/UL — ABNORMAL HIGH (ref 150–400)
Lab: 6.9 FL — ABNORMAL LOW (ref 7–11)
Lab: 7 % — ABNORMAL LOW (ref 24–44)
Lab: 8.2 K/UL — ABNORMAL HIGH (ref 1.8–7.0)
Lab: 86 % — ABNORMAL HIGH (ref 41–77)
Lab: 9.6 K/UL (ref 4.5–11.0)

## 2020-12-14 LAB — ALT (SGPT): Lab: 22 U/L (ref 7–56)

## 2020-12-14 LAB — CREATININE: Lab: 0.6 mg/dL — ABNORMAL LOW (ref 0.4–1.00)

## 2020-12-14 MED FILL — PREDNISONE 5 MG PO TAB: 5 mg | ORAL | 42 days supply | Qty: 91 | Fill #1 | Status: CP

## 2020-12-14 NOTE — Progress Notes
There is no height or weight on file to calculate BMI.      CBC AND DIFF      Component Value Flag Ref Range Units Status    White Blood Cells 9.6      4.5 - 11.0 K/UL Final    RBC 3.98      4.0 - 5.0 M/UL Final    Hemoglobin 10.1      12.0 - 15.0 GM/DL Final    Hematocrit 21.3      36 - 45 % Final    MCV 80.2      80 - 100 FL Final    MCH 25.3      26 - 34 PG Final    MCHC 31.5      32.0 - 36.0 G/DL Final    RDW 08.6      11 - 15 % Final    Platelet Count 546      150 - 400 K/UL Final    MPV 6.9      7 - 11 FL Final    Neutrophils 86      41 - 77 % Final    Lymphocytes 7      24 - 44 % Final    Monocytes 4      4 - 12 % Final    Eosinophils 2      0 - 5 % Final    Basophils 1      0 - 2 % Final    Absolute Neutrophil Count 8.29      1.8 - 7.0 K/UL Final    Absolute Lymph Count 0.62      1.0 - 4.8 K/UL Final    Absolute Monocyte Count 0.42      0 - 0.80 K/UL Final    Absolute Eosinophil Count 0.17      0 - 0.45 K/UL Final    Absolute Basophil Count 0.07      0 - 0.20 K/UL Final            CREATININE      Component Value Flag Ref Range Units Status    Creatinine 0.60      0.4 - 1.00 MG/DL Final    eGFR >57      >60 mL/min Final    Comment:    eGFR calculated using the CKD-EPIcr_R equation            ALT (SGPT)      Component Value Flag Ref Range Units Status    ALT (SGPT) 22      7 - 56 U/L Final                   Assessment and Plan:  A/ 1. 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 Most recent A1c 8.4   -today few macular MA with rare peripheral hemes. No signs of macular edema on OCT     2. 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 Exophoria OU   -with CI, has prism in her glasses      4. Myopic astigmatism with presbyopia OU    P/ 1. Reviewed with patient need for tight control of diabetes to help maintain ocular health. Educated on reduced risk of retinopathy if maintains A1c under 7.0. Monitor with dilated examination in 1 year.  2. Monitor for visual change with examination in 1 year. Discussed potential need for cataract surgery in future. Consider referral if worsening vision or increasing symptoms at next examination.        3-4. Refraction at next examination

## 2020-12-15 NOTE — Progress Notes
Pharmacy Medication Initial Assessment and Education    Indication/Regimen  The regimen of ADALIMUMAB 40 MG/0.4 ML SC PNKT indefinitely is appropriate for Hailey Rodgers who has Rheumatoid arthritis (HCC).    Renal dose adjustments are not required. Hepatic dose adjustments are not required.   Dose titration is not required.    The patient has a caregiver who can administer the medication(s).    Baseline Characteristics  Current DMARDs: MTX, Humira  Previous DMARDs: none  Additional considerations: none  Baseline labs were completed and evaluated.    Therapeutic Goals and Monitoring  The goal of therapy is to improve or control symptoms and disease activity.    Past Medical History  Medical History:   Diagnosis Date   ? Age-related physical debility 10/06/2020   ? Allergic rhinitis    ? Arthritis    ? Asthma    ? Bronchitis    ? Cervical stenosis of spinal canal    ? COVID-19 virus infection 10/06/2020    06/2020   ? Disorganized thinking    ? Diverticulosis 10/06/2020   ? DM (diabetes mellitus) (HCC)    ? Dyslipidemia    ? Fatty liver disease, nonalcoholic    ? Gastroesophageal reflux disease without esophagitis 10/06/2020   ? GERD (gastroesophageal reflux disease)    ? Hypothyroidism    ? 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 Leg weakness    ? Lumbar stenosis    ? Lung nodule 10/06/2020    10/06/2020 5. ?Right upper lobe groundglass nodule measuring 5 mm which may be  infectious or inflammatory. Multiple additional sub-5 mm nodules  bilaterally, most lymph nodes or granulomas. Follow-up CT in 12 months  recommended for further evaluation if clinically indicated.    ? Memory loss    ? Narcolepsy    ? Obesity    ? OSA (obstructive sleep apnea)    ? Other hyperlipidemia 10/06/2020   ? Peripheral neuropathy    ? Pseudoseizures (HCC)    ? 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 Rheumatoid arthritis (HCC) 10/06/2020   ? Sleep disorder     Sleep Apnea & Narcolepsy   ? Type 2 diabetes mellitus with hyperglycemia, with long-term current use of insulin (HCC) 10/06/2020   ? Urinary incontinence    ? Urinary problem     urinary frequency   ? Vision decreased     diplopia     Labs and Diagnostic Tests  TB Screening  T Spot TB   Date Value Ref Range Status   11/10/2020   Final    Negative  Reference range: Normal Value: Negative  A negative test result does not exclude the possibility of exposure  to or infection with Mycobacterium tuberculosis (M. tuberculosis).   Patients with recent exposure to TB infected individuals exhibiting a  negative T-SPOT.TB result should be considered for retesting within 6  weeks or if other relevant clinical symptoms indicate.  Results from  T-SPOT.TB testing must be used in conjunction with each individual's  epidemiological history, current medical status, and results of other  diagnostic evaluations.[id number] T-SPOT.TB test is qualitative  and results are reported as positive, borderline or negative, given  that the test controls perform as expected. In line with the Centers  for Disease Control and Prevention's 2010 recommendation to report  quantitative measurements alongside the qualitative result, the  laboratory provides spot counts for informational purposes only.  The  T-SPOT.TB test should  not be interpreted as a quantitative test.         Hepatitis B Screening  HBsAg   Date/Time Value Ref Range Status   10/07/2020 04:07 AM NONREACTIVE NR-NONREACTIVE Final     Comment:     HBs antigen not detected.     Anti HBc Total   Date Value Ref Range Status   10/07/2020 NONREACTIVE NR-NONREACTIVE Final     Comment:     Antibodies to HBV core antigen (anti-HBc) were not detected.     Anti HBs   Date/Time Value Ref Range Status   10/07/2020 04:07 AM NEG NEG-NEG Final     Comment:     Individual is considered to be non-immune to HBV infection.        Other Pertinent Labs   NA      Allergies  Allergies   Allergen Reactions   ? Amoxicillin-Pot Clavulanate      Allergy recorded in SMS: Augmentin~Reactions: HIVES   ? Gemfibrozil      Allergy recorded in SMS: LOPID~Reactions: HIVES   ? Trazodone HALLUCINATIONS   ? Adhesive Tape (Rosins) RASH   ? Atorvastatin HIVES     States it caused her to have a fatty liver.   Reaction: LFT elevation; Comment: LIPITOR  Reaction: LFT elevation; Comment: LIPITOR  Reaction: LFT elevation; Comment: LIPITOR  Reaction: LFT elevation; Comment: LIPITOR     ? Latex RASH   ? Bupropion      Allergy recorded in SMS: WELLBUTRIN~Reactions: ABNORMAL SEIZUR   ? Cymbalta [Duloxetine] UNKNOWN   ? Levaquin [Levofloxacin] DIARRHEA        Immunizations  Vaccine history was reviewed with the patient. Education was provided on the importance of completing vaccines.    Immunization History   Administered Date(s) Administered   ? COVID-19 (MODERNA), mRNA vacc, 100 mcg/0.5 mL (PF) 10/07/2020   ? Flu Vaccine =>65 YO High-Dose Quadrivalent (PF) 10/25/2020       Medication Reconciliation  Medication history and reconciliation were performed (including prescription medications, supplements, over the counter, and herbal products). The medication list was updated and the patient?s current medication list is included below. The patient was instructed to speak with their health care provider before starting any new drug, including prescription or over the counter, natural / herbal products, or vitamins.    Home Medications    Medication Sig   acetaminophen (TYLENOL EXTRA STRENGTH) 500 mg tablet Take two tablets by mouth every 6 hours as needed. Max of 4,000 mg of acetaminophen in 24 hours.  Indications: pain   adalimumab (HUMIRA(CF) PEN) 40 mg/0.4 mL injection PEN kit Inject 0.4 mL under the skin every 14 days.   aspirin 81 mg chewable tablet Chew 81 mg by mouth at bedtime daily.   blood-glucose sensor (DEXCOM G6 SENSOR MISC) Use  as directed.   calcium carbonate/vitamin D-3 (OSCAL-500+D) 1250 mg/200 unit tablet Take one tablet by mouth twice daily. Calcium Carb 1250mg  delivers 500mg  elemental Ca   celecoxib (CELEBREX) 100 mg capsule Take 100 mg by mouth twice daily.   dicyclomine (BENTYL) 10 mg capsule Take 10 mg by mouth four times daily.   dulaglutide (TRULICITY) 1.5 mg/0.5 mL injection pen Inject 1.5 mg under the skin every Wednesday.   ergocalciferol (VITAMIN D-2) 50,000 unit capsule Take 50,000 Units by mouth every 7 days.   fenofibrate (TRIGLIDE) 160 mg tablet Take 1 tablet by mouth at bedtime daily.   fexofenadine(+) (ALLEGRA) 180 mg tablet Take 180 mg by mouth daily.  FLUoxetine (PROZAC) 20 mg capsule Take 2 capsules by mouth daily.   folic acid (FOLVITE) 1 mg tablet Take one tablet by mouth daily.   furosemide (LASIX) 40 mg tablet Take 40 mg by mouth daily as needed.   insulin aspart U-100 (NOVOLOG U-100 INSULIN ASPART) 100 unit/mL injection Inject twelve Units under the skin three times daily with meals. Take 12 units with breakfast, 22 units with lunch, and 30 units with dinner.  Indications: type 2 diabetes mellitus   insulin degludec (TRESIBA FLEXTOUCH) 200 unit/mL (3 mL) injection PEN Inject eighty Units under the skin at bedtime daily.   Insulin Needles (Disposable) (NANO PEN NEEDLE) 32 gauge x 5/32 ndle Use 1 Each as directed before meals and at bedtime.   levothyroxine (SYNTHROID) 88 mcg tablet Take 88 mcg by mouth daily.   lidocaine (LIDOCARE) 4 % topical patch Apply 1 patch topically to affected area daily.   loratadine (CLARITIN) 10 mg tablet Take 10 mg by mouth at bedtime daily.   methotrexate sodium 2.5 mg tablet Take six tablets by mouth every 7 days.   montelukast (SINGULAIR) 10 mg tablet Take 10 mg by mouth at bedtime daily.   MULTIVITAMINS WITH FLUORIDE (MULTI-VITAMIN PO) Take 1 tablet by mouth daily.   NYSTOP 100,000 unit/gram topical powder Apply to clean, dry skin 2-3 times daily for yeast infection   Omega-3 Acid Ethyl Esters (LOVAZA) 1 gram capsule Take 1 capsule by mouth daily.   oxybutynin XL (DITROPAN XL) 10 mg tablet Take 1 Tab by mouth daily. Do not cut/ crush/ chew   oxyCODONE/acetaminophen (PERCOCET) 5/325 mg tablet Take one tablet to two tablets by mouth every 6 hours as needed   pantoprazole DR (PROTONIX) 40 mg tablet Take 40 mg by mouth twice daily.   polyethylene glycol 3350 (MIRALAX) 17 g packet Take one packet by mouth twice daily as needed.   potassium chloride (KLOR-CON 10) 10 mEq tablet Take 2 tablets by mouth every day as needed if taking furosemide for edema.   pramipexole (MIRAPEX) 0.25 mg tablet Take 0.75 mg by mouth twice daily.   predniSONE (DELTASONE) 5 mg tablet Take four tablets by mouth daily for 7 days, THEN three tablets daily for 7 days, THEN two tablets daily for 14 days, THEN one tablet daily for 14 days, and then stop   predniSONE (DELTASONE) 5 mg tablet Take six tablets by mouth daily with breakfast for 2 days, THEN four tablets daily with breakfast for 7 days, THEN three tablets daily with breakfast for 7 days, THEN two tablets daily with breakfast for 7 days, THEN one tablet daily with breakfast for 30 days. Take 4 tabs (20 mg) daily for 7 days until 10/22, then 3 tabs (15 mg daily) for 7 days until 10/29, then take 2 tabs (10 mg) daily until rheumatology follow-up   rosuvastatin (CRESTOR) 20 mg tablet Take 1 tablet by mouth daily.   senna/docusate (SENOKOT-S) 8.6/50 mg tablet Take one tablet by mouth twice daily.   venlafaxine XR (EFFEXOR XR) 75 mg capsule Take one capsule by mouth daily. Take with food.  Indications: repeated episodes of anxiety, major depressive disorder   vitamin E 400 unit cap capsule Take 1 capsule by mouth at bedtime daily.     Drug Interactions    Drug-Drug Interactions  Drug-drug interactions were evaluated. There were not clinically significant drug-drug interactions.     Drug-Food Interactions  Drug-food interactions were evaluated. There are not clinically significant drug-food interactions.    Safety Precautions  Risk Evaluation and Mitigation (REMS) Assessment: REMS is not required for this medication.    Safety precautions were addressed and discussed with the patient as applicable.    Contraindications: MARIHANNA BETSCHART does not have contraindications to this medication.      Infection Screening:    TB screening has been completed and result was negative - proceed with treatment.    Hepatitis B screening has been completed and result was negative - proceed with treatment.    Hepatitis C screening has been completed and result was negative - proceed with treatment.    Pregnancy Status: Female, not of child-bearing potential, education not applicable.    Medication Education  NATONYA KOSMALSKI was provided with education on their specialty medication(s). Discussion with the patient included: the medication name, regimen, dosing, frequency, duration, proper administration, monitoring, common side effects, contraindications, safety precautions, and food/drug interactions to be aware of. Appropriate storage, safe handling, and disposal directions were reviewed with the patient. Emphasis was placed on the importance of medication compliance. The patient's ability to self-administer medication was assessed. Requirements of the REMS program were discussed with the patient as applicable. Recommended vaccinations were reviewed and discussed with the patient as applicable. The patient was instructed to seek medical attention immediately if they experience signs of an allergic reaction, including but not limited to: a rash; hives; itching; red, swollen, blistered, or peeling skin with or without fever.     The monitoring and follow-up plan was discussed with the patient. The patient was instructed to contact their health care provider if their symptoms or health problems do not get better or if they become worse. LAMIRA PATTEE was instructed to contact the specialty pharmacy at [phone] if they have any questions or concerns regarding their medication therapy. The patient was also encouraged to contact the pharmacist with any further questions. The patient verbalized acceptance and understanding.    Follow-up Plan  The patient was counseled via telephone.    SHAVONTE HENRICKS was given the opportunity to ask questions and did not have any questions at the time. Patient was reminded of the refill process and encouraged to call with questions.    The patient will be reassessed within 3 months.    The medication(s) will be shipped from [facility] pharmacy.    Marni Griffon, PHARMD

## 2020-12-18 ENCOUNTER — Encounter: Admit: 2020-12-18 | Discharge: 2020-12-18 | Payer: MEDICARE

## 2020-12-31 ENCOUNTER — Encounter: Admit: 2020-12-31 | Discharge: 2020-12-31 | Payer: MEDICARE

## 2021-01-03 NOTE — Progress Notes
Date of Service: 01/04/2021    Subjective:             Hailey Rodgers is a 71 y.o. female.    History of Present Illness  Hailey Rodgers is a 70 y.o. 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 She was last seen by Dr. Archer Asa on 10/19/2020. At that time, discontinued hydroxyzine and continued cross-taper from Prozac (40 mg at visit) to Effexor XR (75 mg at visit) medications and continued melatonin 3 mg nightly. Patient follows with Dr. Durene Cal for psychotherapy.    Since her last visit, Hailey Rodgers reports her mood is still bad. She states she was diagnosed with rheumatoid arthritis in October 2021 and lost it causing suicidal ideation for about 1 month. She does not feel she had these suicidal thoughts since her last visit with Dr. Archer Asa (10/19/2020), but admits that she was not as frank or open at the prior visit. She reports feeling more agitated, frustrated by her husband with hearing loss and poor compliance with his hearing aids. She describes getting angry quite easily and states that her dogs at home know when grandma is mad and feels frustrated by her pain and course with rheumatoid arthritis and pain. She describes poor appetite in the interim, but it has improved, and feels uncomfortable being home alone, causing seizure-like activity.    Rodgers describes she has trouble staying asleep at nighttime, possibly worsened by current corticosteroid use. She states last night she woke up confused by her CPAP makes. She prefers to go to bed around midnight but explains that she is often pestered by her family, causing awakenings at night. She reports her husband is now incontinence post-surgery and is going back to work, which is a stress for her. She is using OTC melatonin 5 mg every night which helps her fall asleep but not stay asleep.    She feels frustrated with the frequent change of providers in the [facility] clinic. [name] reports her daughter Hailey Rodgers) is her advocate. She has not tried light box therapy yet and could not recall the instructions.      Psychosocial stressors:  ? [place]  ? Fell at home - multiple times  ? Discord with siblings  ? Feeling like a burden to the family    Social History Update:  ? Lives[place]in[place]home[place]in [place]with her husband (married 51 years)?and daughter 49 yo Hailey Rodgers (disabled after accident) with pets  ? [place]  ? Also has a 78 yo son and a 62 yo stepson who she raised from the age of 2  ? Two grandsons ages 2 and 13   ? On disability for?18 years due to seizures?(not on medications)  ?  Substance Use:  ? Tobacco: denies  ? Alcohol: denies  ? Other: denies    Past psychotropic medications causing adverse reactions:  ? Duloxetine  ? Bupropion  ? Trazodone    Allergies   Allergen Reactions   ? Amoxicillin-Pot Clavulanate      Allergy recorded in SMS: Augmentin~Reactions: HIVES   ? Gemfibrozil      Allergy recorded in SMS: LOPID~Reactions: HIVES   ? Trazodone HALLUCINATIONS   ? Adhesive Tape (Rosins) RASH   ? Atorvastatin HIVES     States  it caused her to have a fatty liver.   Reaction: LFT elevation; Comment: LIPITOR  Reaction: LFT elevation; Comment: LIPITOR  Reaction: LFT elevation; Comment: LIPITOR  Reaction: LFT elevation; Comment: LIPITOR     ? Latex RASH   ? Bupropion      Allergy recorded in SMS: WELLBUTRIN~Reactions: ABNORMAL SEIZUR   ? Cymbalta [Duloxetine] UNKNOWN   ? Levaquin [Levofloxacin] DIARRHEA          Review of Systems   Constitutional: Positive for appetite change and fatigue.   Gastrointestinal: Negative for constipation, diarrhea, nausea and vomiting.   Musculoskeletal: Positive for arthralgias, back pain and myalgias.   Psychiatric/Behavioral: Positive for decreased concentration, dysphoric mood, sleep disturbance and suicidal ideas.         Objective:         ? acetaminophen (TYLENOL EXTRA STRENGTH) 500 mg tablet Take two tablets by mouth every 6 hours as needed. Max of 4,000 mg of acetaminophen in 24 hours.  Indications: pain   ? adalimumab (HUMIRA(CF) PEN) 40 mg/0.4 mL injection PEN kit Inject 0.4 mL under the skin every 14 days.   ? aspirin 81 mg chewable tablet Chew 81 mg by mouth at bedtime daily.   ? blood-glucose sensor (DEXCOM G6 SENSOR MISC) Use  as directed.   ? calcium carbonate/vitamin D-3 (OSCAL-500+D) 1250 mg/200 unit tablet Take one tablet by mouth twice daily. Calcium Carb 1250mg  delivers 500mg  elemental Ca   ? celecoxib (CELEBREX) 100 mg capsule Take 100 mg by mouth twice daily.   ? dicyclomine (BENTYL) 10 mg capsule Take 10 mg by mouth four times daily.   ? dulaglutide (TRULICITY) 1.5 mg/0.5 mL injection pen Inject 1.5 mg under the skin every Wednesday.   ? ergocalciferol (VITAMIN D-2) 50,000 unit capsule Take 50,000 Units by mouth every 7 days.   ? fenofibrate (TRIGLIDE) 160 mg tablet Take 1 tablet by mouth at bedtime daily.   ? fexofenadine(+) (ALLEGRA) 180 mg tablet Take 180 mg by mouth daily.   ? folic acid (FOLVITE) 1 mg tablet Take one tablet by mouth daily.   ? furosemide (LASIX) 40 mg tablet Take 40 mg by mouth daily as needed.   ? insulin aspart U-100 (NOVOLOG U-100 INSULIN ASPART) 100 unit/mL injection Inject twelve Units under the skin three times daily with meals. Take 12 units with breakfast, 22 units with lunch, and 30 units with dinner.  Indications: type 2 diabetes mellitus   ? insulin degludec (TRESIBA FLEXTOUCH) 200 unit/mL (3 mL) injection PEN Inject eighty Units under the skin at bedtime daily.   ? Insulin Needles (Disposable) (NANO PEN NEEDLE) 32 gauge x 5/32 ndle Use 1 Each as directed before meals and at bedtime.   ? levothyroxine (SYNTHROID) 88 mcg tablet Take 88 mcg by mouth daily.   ? lidocaine (LIDOCARE) 4 % topical patch Apply 1 patch topically to affected area daily.   ? Melatonin 5 mg cap Take  by mouth at bedtime daily.   ? methotrexate sodium 2.5 mg tablet Take six tablets by mouth every 7 days.   ? mirtazapine 7.5 mg tablet Take one tablet by mouth at bedtime daily. Indications: insomnia, sleep maintenance   ? montelukast (SINGULAIR) 10 mg tablet Take 10 mg by mouth at bedtime daily.   ? MULTIVITAMINS WITH FLUORIDE (MULTI-VITAMIN PO) Take 1 tablet by mouth daily.   ? NYSTOP 100,000 unit/gram topical powder Apply to clean, dry skin 2-3 times daily for yeast infection   ? Omega-3 Acid Ethyl Esters (LOVAZA) 1  gram capsule Take 1 capsule by mouth daily.   ? oxybutynin XL (DITROPAN XL) 10 mg tablet Take 1 Tab by mouth daily. Do not cut/ crush/ chew   ? oxyCODONE/acetaminophen (PERCOCET) 5/325 mg tablet Take one tablet to two tablets by mouth every 6 hours as needed   ? pantoprazole DR (PROTONIX) 40 mg tablet Take 40 mg by mouth twice daily.   ? polyethylene glycol 3350 (MIRALAX) 17 g packet Take one packet by mouth twice daily as needed.   ? potassium chloride (KLOR-CON 10) 10 mEq tablet Take 2 tablets by mouth every day as needed if taking furosemide for edema.   ? pramipexole (MIRAPEX) 0.25 mg tablet Take 0.75 mg by mouth twice daily.   ? predniSONE (DELTASONE) 5 mg tablet Take four tablets by mouth daily for 7 days, THEN three tablets daily for 7 days, THEN two tablets daily for 14 days, THEN one tablet daily for 14 days, and then stop   ? rosuvastatin (CRESTOR) 20 mg tablet Take 1 tablet by mouth daily.   ? senna/docusate (SENOKOT-S) 8.6/50 mg tablet Take one tablet by mouth twice daily.   ? venlafaxine XR (EFFEXOR XR) 150 mg capsule Take one capsule by mouth daily with breakfast. Take with food.  Indications: repeated episodes of anxiety, major depressive disorder, panic disorder   ? vitamin E 400 unit cap capsule Take 1 capsule by mouth at bedtime daily.     Vitals:    01/04/21 1259   BP: (!) 166/72   Pulse: 86 Weight: 93.4 kg (206 lb)   Height: 170.2 cm (67)   PainSc: Zero     Body mass index is 32.26 kg/m?[name]     Physical Exam  Psychiatric:      Comments: MENTAL STATUS EXAMINATION  General/Constitutional: 69 year old married Caucasian appears stated age, dressed in personal clothes, fair grooming  Eye Contact: good  Behavior: Calm, cooperative; appropriate for conversation  Speech: spontaneous,RRR with normal volume and tone. Good articulation  Mood: Anxious I guess  Affect: Full, mood congruent, irritable  Thought Process: Linear and goal directed  Thought Content: Denies SI, HI when asked. No evidence of delusions  Perception: Denies AVH  Associations: Intact  Insight/Judgment: Fair to poor/Fair to poor    Gait: Ambulates with cane              Assessment and Plan:  IMPRESSION DIAGNOSIS:    DSM 5 Diagnoses, medical issues, psychosocial stressors  1. Persistent depressive disorder with anxious distress, currently moderate    2. Generalized anxiety disorder    3. Panic disorder without agoraphobia    4. Psychogenic nonepileptic seizure    5. Polypharmacy        Summary/Formulation:   KALIYAH LUALLEN is a 70 y.o. Caucasian female with a history of above who presents to [facility] outpatient psychiatry clinic for follow up. Persistent depressive and anxiety symptoms exacerabation 2/2 pain associated with RA, poor sleep, perceived lack of support from family, and limitations of mobility and self-care. Anticipate improvement in depressive, anxiety, and pain symptoms with further titration of venlafaxine. Patient reports minimal benefit from melatonin for sleep and past adverse reaction to trazodone; therefore, will offer low dose mirtazapine for sleep maintenance assistance.      PLAN:  ? Increase Effexor XR to 150 mg QAM with meal for depression and anxiety  ? Provided patient medication information in AVS  ? Discontinue Prozac 40 mg  ? Expect medication will self-taper without issue with concurrent increase  in venlafaxine  ? Start mirtazapine 7.5 mg QHS for insomnia and sleep maintenance  ? Patient counseled on side effects of increased appetite/weight gain and potential for AM grogginess  ? Provided patient medication information in AVS  ? Recommend bright light therapy with 10,000 lux source for 30 minutes every morning for mood and maintenance of circadian rhythm  ? Provided instructions in AVS  ? Continue individual psychotherapy with Dr. Durene Cal      RTC: 2-3 months with Dr. Archer Asa    Seen and discussed with Dr. Lajean Saver    The proposed treatment plan was discussed with the patient/guardian who was provided the opportunity to ask questions and make suggestions regarding alternative treatment.     Problem   Polypharmacy

## 2021-01-04 ENCOUNTER — Encounter: Admit: 2021-01-04 | Discharge: 2021-01-04 | Payer: MEDICARE

## 2021-01-04 ENCOUNTER — Ambulatory Visit: Admit: 2021-01-04 | Discharge: 2021-01-05 | Payer: MEDICARE

## 2021-01-04 ENCOUNTER — Ambulatory Visit: Admit: 2021-01-04 | Discharge: 2021-01-04 | Payer: MEDICARE

## 2021-01-04 DIAGNOSIS — G4733 Obstructive sleep apnea (adult) (pediatric): Secondary | ICD-10-CM

## 2021-01-04 DIAGNOSIS — M069 Rheumatoid arthritis, unspecified: Secondary | ICD-10-CM

## 2021-01-04 DIAGNOSIS — R413 Other amnesia: Secondary | ICD-10-CM

## 2021-01-04 DIAGNOSIS — R29898 Other symptoms and signs involving the musculoskeletal system: Secondary | ICD-10-CM

## 2021-01-04 DIAGNOSIS — M48061 Spinal stenosis, lumbar region without neurogenic claudication: Secondary | ICD-10-CM

## 2021-01-04 DIAGNOSIS — F411 Generalized anxiety disorder: Secondary | ICD-10-CM

## 2021-01-04 DIAGNOSIS — F410 Panic disorder [episodic paroxysmal anxiety] without agoraphobia: Secondary | ICD-10-CM

## 2021-01-04 DIAGNOSIS — Z79899 Other long term (current) drug therapy: Secondary | ICD-10-CM

## 2021-01-04 DIAGNOSIS — E039 Hypothyroidism, unspecified: Secondary | ICD-10-CM

## 2021-01-04 DIAGNOSIS — K219 Gastro-esophageal reflux disease without esophagitis: Secondary | ICD-10-CM

## 2021-01-04 DIAGNOSIS — E785 Hyperlipidemia, unspecified: Secondary | ICD-10-CM

## 2021-01-04 DIAGNOSIS — E034 Atrophy of thyroid (acquired): Secondary | ICD-10-CM

## 2021-01-04 DIAGNOSIS — F445 Conversion disorder with seizures or convulsions: Secondary | ICD-10-CM

## 2021-01-04 DIAGNOSIS — E1165 Type 2 diabetes mellitus with hyperglycemia: Secondary | ICD-10-CM

## 2021-01-04 DIAGNOSIS — M4802 Spinal stenosis, cervical region: Secondary | ICD-10-CM

## 2021-01-04 DIAGNOSIS — R32 Unspecified urinary incontinence: Secondary | ICD-10-CM

## 2021-01-04 DIAGNOSIS — J309 Allergic rhinitis, unspecified: Secondary | ICD-10-CM

## 2021-01-04 DIAGNOSIS — E119 Type 2 diabetes mellitus without complications: Secondary | ICD-10-CM

## 2021-01-04 DIAGNOSIS — G479 Sleep disorder, unspecified: Secondary | ICD-10-CM

## 2021-01-04 DIAGNOSIS — E7849 Other hyperlipidemia: Secondary | ICD-10-CM

## 2021-01-04 DIAGNOSIS — F341 Dysthymic disorder: Secondary | ICD-10-CM

## 2021-01-04 DIAGNOSIS — U071 COVID-19 virus infection: Secondary | ICD-10-CM

## 2021-01-04 DIAGNOSIS — R4189 Other symptoms and signs involving cognitive functions and awareness: Secondary | ICD-10-CM

## 2021-01-04 DIAGNOSIS — H547 Unspecified visual loss: Secondary | ICD-10-CM

## 2021-01-04 DIAGNOSIS — G629 Polyneuropathy, unspecified: Secondary | ICD-10-CM

## 2021-01-04 DIAGNOSIS — F3341 Major depressive disorder, recurrent, in partial remission: Secondary | ICD-10-CM

## 2021-01-04 DIAGNOSIS — J45909 Unspecified asthma, uncomplicated: Secondary | ICD-10-CM

## 2021-01-04 DIAGNOSIS — R911 Solitary pulmonary nodule: Secondary | ICD-10-CM

## 2021-01-04 DIAGNOSIS — R54 Age-related physical debility: Secondary | ICD-10-CM

## 2021-01-04 DIAGNOSIS — F3342 Major depressive disorder, recurrent, in full remission: Secondary | ICD-10-CM

## 2021-01-04 DIAGNOSIS — K760 Fatty (change of) liver, not elsewhere classified: Secondary | ICD-10-CM

## 2021-01-04 DIAGNOSIS — G47419 Narcolepsy without cataplexy: Secondary | ICD-10-CM

## 2021-01-04 DIAGNOSIS — M1990 Unspecified osteoarthritis, unspecified site: Secondary | ICD-10-CM

## 2021-01-04 DIAGNOSIS — R569 Unspecified convulsions: Secondary | ICD-10-CM

## 2021-01-04 DIAGNOSIS — K5790 Diverticulosis of intestine, part unspecified, without perforation or abscess without bleeding: Secondary | ICD-10-CM

## 2021-01-04 DIAGNOSIS — J40 Bronchitis, not specified as acute or chronic: Secondary | ICD-10-CM

## 2021-01-04 DIAGNOSIS — R3989 Other symptoms and signs involving the genitourinary system: Secondary | ICD-10-CM

## 2021-01-04 DIAGNOSIS — E669 Obesity, unspecified: Secondary | ICD-10-CM

## 2021-01-04 MED ORDER — VENLAFAXINE 150 MG PO CP24
150 mg | ORAL_CAPSULE | Freq: Every day | ORAL | 1 refills | Status: AC
Start: 2021-01-04 — End: ?

## 2021-01-04 MED ORDER — MIRTAZAPINE 7.5 MG PO TAB
7.5 mg | ORAL_TABLET | Freq: Every evening | ORAL | 1 refills | Status: AC
Start: 2021-01-04 — End: ?

## 2021-01-04 NOTE — Progress Notes
ATTENDING NOTE  I saw and evaluated Hailey Rodgers, discussed with Torrie Mayers, MD and concur with the assessment and treatment plan. Patient is 70 y.o. female with MDD, PDD, GAD, Panic Disorder and PNES. Pt reports increased symptoms of anxiety and irritability. Denies SI/HI and AVH and no other safety concerns. Pt reports no medication side effects.    PLAN:  The following medication changes were made during this visit to better treat the above symptoms:  1. Continue Cross Taper from Prozac 40mg  PO Daily to Effexor XR 150mg  PO Daily  2. Start Remeron 7.5mg  PO QHS  3. No labs needed    ? acetaminophen (TYLENOL EXTRA STRENGTH) 500 mg tablet Take two tablets by mouth every 6 hours as needed. Max of 4,000 mg of acetaminophen in 24 hours.  Indications: pain   ? adalimumab (HUMIRA(CF) PEN) 40 mg/0.4 mL injection PEN kit Inject 0.4 mL under the skin every 14 days.   ? aspirin 81 mg chewable tablet Chew 81 mg by mouth at bedtime daily.   ? blood-glucose sensor (DEXCOM G6 SENSOR MISC) Use  as directed.   ? calcium carbonate/vitamin D-3 (OSCAL-500+D) 1250 mg/200 unit tablet Take one tablet by mouth twice daily. Calcium Carb 1250mg  delivers 500mg  elemental Ca   ? celecoxib (CELEBREX) 100 mg capsule Take 100 mg by mouth twice daily.   ? dicyclomine (BENTYL) 10 mg capsule Take 10 mg by mouth four times daily.   ? dulaglutide (TRULICITY) 1.5 mg/0.5 mL injection pen Inject 1.5 mg under the skin every Wednesday.   ? ergocalciferol (VITAMIN D-2) 50,000 unit capsule Take 50,000 Units by mouth every 7 days.   ? fenofibrate (TRIGLIDE) 160 mg tablet Take 1 tablet by mouth at bedtime daily.   ? fexofenadine(+) (ALLEGRA) 180 mg tablet Take 180 mg by mouth daily.   ? folic acid (FOLVITE) 1 mg tablet Take one tablet by mouth daily.   ? furosemide (LASIX) 40 mg tablet Take 40 mg by mouth daily as needed.   ? insulin aspart U-100 (NOVOLOG U-100 INSULIN ASPART) 100 unit/mL injection Inject twelve Units under the skin three times daily with meals. Take 12 units with breakfast, 22 units with lunch, and 30 units with dinner.  Indications: type 2 diabetes mellitus   ? insulin degludec (TRESIBA FLEXTOUCH) 200 unit/mL (3 mL) injection PEN Inject eighty Units under the skin at bedtime daily.   ? Insulin Needles (Disposable) (NANO PEN NEEDLE) 32 gauge x 5/32 ndle Use 1 Each as directed before meals and at bedtime.   ? levothyroxine (SYNTHROID) 88 mcg tablet Take 88 mcg by mouth daily.   ? lidocaine (LIDOCARE) 4 % topical patch Apply 1 patch topically to affected area daily.   ? Melatonin 5 mg cap Take  by mouth at bedtime daily.   ? methotrexate sodium 2.5 mg tablet Take six tablets by mouth every 7 days.   ? montelukast (SINGULAIR) 10 mg tablet Take 10 mg by mouth at bedtime daily.   ? MULTIVITAMINS WITH FLUORIDE (MULTI-VITAMIN PO) Take 1 tablet by mouth daily.   ? NYSTOP 100,000 unit/gram topical powder Apply to clean, dry skin 2-3 times daily for yeast infection   ? Omega-3 Acid Ethyl Esters (LOVAZA) 1 gram capsule Take 1 capsule by mouth daily.   ? oxybutynin XL (DITROPAN XL) 10 mg tablet Take 1 Tab by mouth daily. Do not cut/ crush/ chew   ? oxyCODONE/acetaminophen (PERCOCET) 5/325 mg tablet Take one tablet to two tablets by mouth every 6 hours  as needed   ? pantoprazole DR (PROTONIX) 40 mg tablet Take 40 mg by mouth twice daily.   ? polyethylene glycol 3350 (MIRALAX) 17 g packet Take one packet by mouth twice daily as needed.   ? potassium chloride (KLOR-CON 10) 10 mEq tablet Take 2 tablets by mouth every day as needed if taking furosemide for edema.   ? pramipexole (MIRAPEX) 0.25 mg tablet Take 0.75 mg by mouth twice daily.   ? predniSONE (DELTASONE) 5 mg tablet Take four tablets by mouth daily for 7 days, THEN three tablets daily for 7 days, THEN two tablets daily for 14 days, THEN one tablet daily for 14 days, and then stop   ? rosuvastatin (CRESTOR) 20 mg tablet Take 1 tablet by mouth daily.   ? senna/docusate (SENOKOT-S) 8.6/50 mg tablet Take one tablet by mouth twice daily.   ? venlafaxine XR (EFFEXOR XR) 75 mg capsule Take one capsule by mouth daily. Take with food.  Indications: repeated episodes of anxiety, major depressive disorder   ? vitamin E 400 unit cap capsule Take 1 capsule by mouth at bedtime daily.       Rae Mar, MD  01/04/2021

## 2021-01-04 NOTE — Progress Notes
Progress Note  START TIME: 2:10 PM  STOP TIME: 3:00 PM    BEHAVIORS/SYMPTOMS/DATA/PATIENT COMPLAINT:  Pt developed rheumatoid arthritis since last seen and describes being unable to move, paralyzed. Did not want to live but not feeling that way any longer. Her husband is working but he does not share the money with her.  Gives history of challenges of son not having anything to do with her and another who is very aggressive.  Says therapy is helpful in that she can relate and she does not have to change doctors.                             MENTAL STATUS EXAM AND BEHAVIORAL OBSERVATIONS:  The patient is oriented and alert.  Mood is good.  Affect is full range.  Emotions are fairly well modulated.  Insight is limited.  No evidence of suicidal or homicidal ideation or intent.        INTERVENTIONS/RESPONSE:  Supportive Psychotherapy.  Focus of meeting was on learning developments since last seen and providing emotional support and validation to help bolster mood and stress response.                                       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        [ X   ]  The proposed treatment plan was discussed with the patient/guardian who was provided the opportunity to ask questions and make suggestions regarding alternative treatment.  If not, why not  Risks/Benefits of Treatment Options Reviewed:  YES    RETURN APPOINTMENT: Monthly.    Wallace Keller Yong Channel, Ph.D., [place]  Board Certified in [employer]

## 2021-01-05 ENCOUNTER — Encounter: Admit: 2021-01-05 | Discharge: 2021-01-05 | Payer: MEDICARE

## 2021-01-05 ENCOUNTER — Ambulatory Visit: Admit: 2021-01-05 | Discharge: 2021-01-06 | Payer: MEDICARE

## 2021-01-05 DIAGNOSIS — R54 Age-related physical debility: Secondary | ICD-10-CM

## 2021-01-05 DIAGNOSIS — M069 Rheumatoid arthritis, unspecified: Secondary | ICD-10-CM

## 2021-01-05 DIAGNOSIS — G47419 Narcolepsy without cataplexy: Secondary | ICD-10-CM

## 2021-01-05 DIAGNOSIS — J45909 Unspecified asthma, uncomplicated: Secondary | ICD-10-CM

## 2021-01-05 DIAGNOSIS — J309 Allergic rhinitis, unspecified: Secondary | ICD-10-CM

## 2021-01-05 DIAGNOSIS — M48061 Spinal stenosis, lumbar region without neurogenic claudication: Secondary | ICD-10-CM

## 2021-01-05 DIAGNOSIS — F3341 Major depressive disorder, recurrent, in partial remission: Secondary | ICD-10-CM

## 2021-01-05 DIAGNOSIS — R3989 Other symptoms and signs involving the genitourinary system: Secondary | ICD-10-CM

## 2021-01-05 DIAGNOSIS — E669 Obesity, unspecified: Secondary | ICD-10-CM

## 2021-01-05 DIAGNOSIS — R569 Unspecified convulsions: Secondary | ICD-10-CM

## 2021-01-05 DIAGNOSIS — E1165 Type 2 diabetes mellitus with hyperglycemia: Secondary | ICD-10-CM

## 2021-01-05 DIAGNOSIS — Z79899 Other long term (current) drug therapy: Secondary | ICD-10-CM

## 2021-01-05 DIAGNOSIS — J40 Bronchitis, not specified as acute or chronic: Secondary | ICD-10-CM

## 2021-01-05 DIAGNOSIS — D849 Immunodeficiency, unspecified: Secondary | ICD-10-CM

## 2021-01-05 DIAGNOSIS — E119 Type 2 diabetes mellitus without complications: Secondary | ICD-10-CM

## 2021-01-05 DIAGNOSIS — R32 Unspecified urinary incontinence: Secondary | ICD-10-CM

## 2021-01-05 DIAGNOSIS — E785 Hyperlipidemia, unspecified: Secondary | ICD-10-CM

## 2021-01-05 DIAGNOSIS — G4733 Obstructive sleep apnea (adult) (pediatric): Secondary | ICD-10-CM

## 2021-01-05 DIAGNOSIS — K5790 Diverticulosis of intestine, part unspecified, without perforation or abscess without bleeding: Secondary | ICD-10-CM

## 2021-01-05 DIAGNOSIS — G629 Polyneuropathy, unspecified: Secondary | ICD-10-CM

## 2021-01-05 DIAGNOSIS — E034 Atrophy of thyroid (acquired): Secondary | ICD-10-CM

## 2021-01-05 DIAGNOSIS — R413 Other amnesia: Secondary | ICD-10-CM

## 2021-01-05 DIAGNOSIS — K760 Fatty (change of) liver, not elsewhere classified: Secondary | ICD-10-CM

## 2021-01-05 DIAGNOSIS — R911 Solitary pulmonary nodule: Secondary | ICD-10-CM

## 2021-01-05 DIAGNOSIS — M4802 Spinal stenosis, cervical region: Secondary | ICD-10-CM

## 2021-01-05 DIAGNOSIS — R4189 Other symptoms and signs involving cognitive functions and awareness: Secondary | ICD-10-CM

## 2021-01-05 DIAGNOSIS — H547 Unspecified visual loss: Secondary | ICD-10-CM

## 2021-01-05 DIAGNOSIS — R29898 Other symptoms and signs involving the musculoskeletal system: Secondary | ICD-10-CM

## 2021-01-05 DIAGNOSIS — M159 Polyosteoarthritis, unspecified: Secondary | ICD-10-CM

## 2021-01-05 DIAGNOSIS — E7849 Other hyperlipidemia: Secondary | ICD-10-CM

## 2021-01-05 DIAGNOSIS — G479 Sleep disorder, unspecified: Secondary | ICD-10-CM

## 2021-01-05 DIAGNOSIS — M1990 Unspecified osteoarthritis, unspecified site: Secondary | ICD-10-CM

## 2021-01-05 DIAGNOSIS — E039 Hypothyroidism, unspecified: Secondary | ICD-10-CM

## 2021-01-05 DIAGNOSIS — K219 Gastro-esophageal reflux disease without esophagitis: Secondary | ICD-10-CM

## 2021-01-05 DIAGNOSIS — U071 COVID-19 virus infection: Secondary | ICD-10-CM

## 2021-01-05 MED ORDER — RXAMB DIPH/LIDO/ANTACID 1:1:1 (COMPOUND)
5 mL | ORAL | 0 refills | 28.00000 days | Status: DC | PRN
Start: 2021-01-05 — End: 2021-01-05

## 2021-01-05 MED ORDER — RXAMB DIPH/LIDO/ANTACID 1:1:1 (COMPOUND)
5 mL | ORAL | 3 refills | 28.00000 days | Status: AC | PRN
Start: 2021-01-05 — End: ?
  Filled 2021-01-05: qty 100, 10d supply, fill #1

## 2021-01-05 MED ORDER — PREDNISONE 2.5 MG PO TAB
ORAL_TABLET | Freq: Every day | 0 refills | Status: AC
Start: 2021-01-05 — End: ?

## 2021-01-05 MED ORDER — FOLIC ACID 1 MG PO TAB
2 mg | ORAL_TABLET | Freq: Every day | ORAL | 3 refills | Status: AC
Start: 2021-01-05 — End: ?

## 2021-01-05 NOTE — Telephone Encounter
Pharmacy called and left vm stating they would like to clarify prednisone script, since quantity does not match the instructions.     This RN returned pharmacy call and notified them of MD's medication instructions. PharmD denies any further questions or concerns.

## 2021-01-12 ENCOUNTER — Encounter: Admit: 2021-01-12 | Discharge: 2021-01-12 | Payer: MEDICARE

## 2021-01-12 MED FILL — HUMIRA(CF) PEN 40 MG/0.4 ML SC PNKT: 40 mg/0.4 mL | SUBCUTANEOUS | 28 days supply | Qty: 1 | Fill #2 | Status: AC

## 2021-01-15 ENCOUNTER — Encounter: Admit: 2021-01-15 | Discharge: 2021-01-15 | Payer: MEDICARE

## 2021-01-17 ENCOUNTER — Encounter: Admit: 2021-01-17 | Discharge: 2021-01-17 | Payer: MEDICARE

## 2021-01-18 ENCOUNTER — Encounter: Admit: 2021-01-18 | Discharge: 2021-01-18 | Payer: MEDICARE

## 2021-01-21 ENCOUNTER — Encounter: Admit: 2021-01-21 | Discharge: 2021-01-21 | Payer: MEDICARE

## 2021-01-21 MED ORDER — METHOTREXATE SODIUM 2.5 MG PO TAB
ORAL_TABLET | 0 refills
Start: 2021-01-21 — End: ?

## 2021-02-16 ENCOUNTER — Encounter: Admit: 2021-02-16 | Discharge: 2021-02-16 | Payer: MEDICARE

## 2021-02-19 ENCOUNTER — Encounter: Admit: 2021-02-19 | Discharge: 2021-02-19 | Payer: MEDICARE

## 2021-02-19 MED FILL — HUMIRA(CF) PEN 40 MG/0.4 ML SC PNKT: 40 mg/0.4 mL | SUBCUTANEOUS | 28 days supply | Qty: 1 | Fill #3 | Status: CP

## 2021-02-25 ENCOUNTER — Encounter: Admit: 2021-02-25 | Discharge: 2021-02-25 | Payer: MEDICARE

## 2021-02-26 ENCOUNTER — Encounter: Admit: 2021-02-26 | Discharge: 2021-02-26 | Payer: MEDICARE

## 2021-02-26 NOTE — Telephone Encounter
Pt LVM requesting call back to discuss urgent referral placed by Rheum.     RN noted:         RN called pt. Appt set for:           [facility] address provided. RN informed pt, appt can be viewed via my chart. Pt thankful and denied further concerns.

## 2021-02-28 ENCOUNTER — Encounter: Admit: 2021-02-28 | Discharge: 2021-02-28 | Payer: MEDICARE

## 2021-02-28 ENCOUNTER — Ambulatory Visit: Admit: 2021-02-28 | Discharge: 2021-03-01 | Payer: MEDICARE

## 2021-02-28 DIAGNOSIS — E119 Type 2 diabetes mellitus without complications: Secondary | ICD-10-CM

## 2021-02-28 DIAGNOSIS — J45909 Unspecified asthma, uncomplicated: Secondary | ICD-10-CM

## 2021-02-28 DIAGNOSIS — R29898 Other symptoms and signs involving the musculoskeletal system: Secondary | ICD-10-CM

## 2021-02-28 DIAGNOSIS — M48061 Spinal stenosis, lumbar region without neurogenic claudication: Secondary | ICD-10-CM

## 2021-02-28 DIAGNOSIS — K5790 Diverticulosis of intestine, part unspecified, without perforation or abscess without bleeding: Secondary | ICD-10-CM

## 2021-02-28 DIAGNOSIS — M069 Rheumatoid arthritis, unspecified: Secondary | ICD-10-CM

## 2021-02-28 DIAGNOSIS — G4733 Obstructive sleep apnea (adult) (pediatric): Secondary | ICD-10-CM

## 2021-02-28 DIAGNOSIS — G47419 Narcolepsy without cataplexy: Secondary | ICD-10-CM

## 2021-02-28 DIAGNOSIS — E1165 Type 2 diabetes mellitus with hyperglycemia: Secondary | ICD-10-CM

## 2021-02-28 DIAGNOSIS — M4802 Spinal stenosis, cervical region: Secondary | ICD-10-CM

## 2021-02-28 DIAGNOSIS — R4189 Other symptoms and signs involving cognitive functions and awareness: Secondary | ICD-10-CM

## 2021-02-28 DIAGNOSIS — E034 Atrophy of thyroid (acquired): Secondary | ICD-10-CM

## 2021-02-28 DIAGNOSIS — E785 Hyperlipidemia, unspecified: Secondary | ICD-10-CM

## 2021-02-28 DIAGNOSIS — J40 Bronchitis, not specified as acute or chronic: Secondary | ICD-10-CM

## 2021-02-28 DIAGNOSIS — E039 Hypothyroidism, unspecified: Secondary | ICD-10-CM

## 2021-02-28 DIAGNOSIS — K760 Fatty (change of) liver, not elsewhere classified: Secondary | ICD-10-CM

## 2021-02-28 DIAGNOSIS — K219 Gastro-esophageal reflux disease without esophagitis: Secondary | ICD-10-CM

## 2021-02-28 DIAGNOSIS — E669 Obesity, unspecified: Secondary | ICD-10-CM

## 2021-02-28 DIAGNOSIS — F3341 Major depressive disorder, recurrent, in partial remission: Secondary | ICD-10-CM

## 2021-02-28 DIAGNOSIS — Z79899 Other long term (current) drug therapy: Secondary | ICD-10-CM

## 2021-02-28 DIAGNOSIS — R569 Unspecified convulsions: Secondary | ICD-10-CM

## 2021-02-28 DIAGNOSIS — E7849 Other hyperlipidemia: Secondary | ICD-10-CM

## 2021-02-28 DIAGNOSIS — R54 Age-related physical debility: Secondary | ICD-10-CM

## 2021-02-28 DIAGNOSIS — R32 Unspecified urinary incontinence: Secondary | ICD-10-CM

## 2021-02-28 DIAGNOSIS — H547 Unspecified visual loss: Secondary | ICD-10-CM

## 2021-02-28 DIAGNOSIS — G479 Sleep disorder, unspecified: Secondary | ICD-10-CM

## 2021-02-28 DIAGNOSIS — R413 Other amnesia: Secondary | ICD-10-CM

## 2021-02-28 DIAGNOSIS — M1990 Unspecified osteoarthritis, unspecified site: Secondary | ICD-10-CM

## 2021-02-28 DIAGNOSIS — R911 Solitary pulmonary nodule: Secondary | ICD-10-CM

## 2021-02-28 DIAGNOSIS — U071 COVID-19 virus infection: Secondary | ICD-10-CM

## 2021-02-28 DIAGNOSIS — G629 Polyneuropathy, unspecified: Secondary | ICD-10-CM

## 2021-02-28 DIAGNOSIS — J309 Allergic rhinitis, unspecified: Secondary | ICD-10-CM

## 2021-02-28 DIAGNOSIS — R441 Visual hallucinations: Secondary | ICD-10-CM

## 2021-02-28 DIAGNOSIS — R3989 Other symptoms and signs involving the genitourinary system: Secondary | ICD-10-CM

## 2021-02-28 LAB — IRON + BINDING CAPACITY + %SAT+ FERRITIN
Lab: 38 ug/dL — ABNORMAL LOW (ref 50–160)
Lab: 7 % — ABNORMAL LOW (ref 28–42)
Lab: 86 ng/mL (ref 10–200)

## 2021-02-28 LAB — VITAMIN B12: Lab: 532 pg/mL — ABNORMAL HIGH (ref 180–914)

## 2021-02-28 NOTE — Progress Notes
Date of Service: 02/28/2021    Subjective:             Hailey Rodgers is a 70 y.o. female here for hallucinations.     History of Present Illness  Hailey Rodgers is a 70 y.o. female with a past medical history of rheumatoid arthritis (started on Humira a few months ago), depression, asthma, cervical spondylosis and spinal canal stenosis, COVID-19 (09/2020), DM, hyperlipidemia, hypothyroidism, narcolepsy, OSA, obesity and non epileptic seizures, referred to our clinic due to visual hallucinations and worsening somnolence. Patient reports visual symptoms for the past 2 months. She saw a man with a horse, a piece of paper and also some shadows in the past. These symptoms may happen at anytime and she denies clear triggers, frequency has been once or twice daily, this has not changed lately. She denies auditory hallucinations or passing out activity. Patient denies headaches, weakness, numbness/tingling, slurred speech, dizziness or facial droop during episodes. 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 Patient does need help with ADLs, she is able to walk with the help of a walker/cane due to balance problems but reports recurrent falls lately. She has been more forgetful since she had COVID last 06/2020. She describes problems lately when trying to remember names of people and she forgets where she puts her belongings. Patient needs to have reminders in order to keep appointments and daughter helps with medications for the past months. Overall short term memory seems more affected. Patient is normally able to recognize family and close friends. Denies episodes of disorientation in the past, but family supervises her lately. Patient used to drive in the past but denies driving in the past years due to seizures. She takes Mirapex for years due to presumed RLS and also has presumed narcolepsy, but she never had sleep testing and she is not seen by a sleep doctor lately. Patient also takes oxybutynin for years due to bladder problems.       Review of records:  CT head (09/2020): No intracranial hemorrhage or calvarial fracture. Minimal supratentorial white matter hypodensities, better seen on MRI. These are nonspecific though likely due to chronic microvascular ischemic changes in a patient this age.   MRI brain (2019): No recent infarct, intracranial hemorrhage, or mass lesion. Unchanged supratentorial white matter FLAIR hyperintensities likely chronic microvascular ischemic changes. No findings to explain epileptic events.  EEG (2020): This is a normal 72 hour ambulatory video EEG study. The patient had 3 clinical events which were not epileptic in origin. No epileptic seizures were recorded.  CT cervical spine (09/2020): No acute fracture or new subluxation. Mild grade 1 anterolisthesis at C3-4 and retrolisthesis at C5-6 is stable. Redemonstration of prior posterior spinal fixation extending from C3 through C7.  Multilevel degenerative changes with areas of up to moderate to marked neural foraminal narrowing.  Labs (2021) with normal TSH.       Review of Systems   Musculoskeletal: Positive for arthralgias (shoulders) and gait problem (balance).   Psychiatric/Behavioral: Positive for agitation, confusion and decreased concentration.   All other systems reviewed and are negative.    Twelve-point review of systems was done in detail. Patient denies any recent mood changes but has history of depression (taking Effexor). The patient does report chronic history of OSA (she is using a BIPAP intermittently). The patient reports daytime somnolence and insomnia in the past. She moves her legs at  night and gets some relief with movements, Mirapex helps with symptoms. The patient also reports chronic numbness and tingling in both hands/feet. She tried Gabapentin and Lyrica in the past, but daughter does not remember if medications helped or caused problems. Denies history of strokes, cardiac arrhythmias or CAD.       Medical History:   Diagnosis Date   ? Age-related physical debility 10/06/2020   ? Allergic rhinitis    ? Arthritis    ? Asthma    ? Bronchitis    ? Cervical stenosis of spinal canal    ? COVID-19 virus infection 10/06/2020    06/2020   ? Disorganized thinking    ? Diverticulosis 10/06/2020   ? DM (diabetes mellitus) (HCC)    ? Dyslipidemia    ? Fatty liver disease, nonalcoholic    ? Gastroesophageal reflux disease without esophagitis 10/06/2020   ? GERD (gastroesophageal reflux disease)    ? Hypothyroidism    ? 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 Leg weakness    ? Lumbar stenosis    ? Lung nodule 10/06/2020    10/06/2020 5. ?Right upper lobe groundglass nodule measuring 5 mm which may be  infectious or inflammatory. Multiple additional sub-5 mm nodules  bilaterally, most lymph nodes or granulomas. Follow-up CT in 12 months  recommended for further evaluation if clinically indicated.    ? Memory loss    ? Narcolepsy    ? Obesity    ? OSA (obstructive sleep apnea)    ? Other hyperlipidemia 10/06/2020   ? Peripheral neuropathy    ? Polypharmacy 01/04/2021   ? Pseudoseizures (HCC)    ? 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 Rheumatoid arthritis (HCC) 10/06/2020   ? Sleep disorder     Sleep Apnea & Narcolepsy   ? Type 2 diabetes mellitus with hyperglycemia, with long-term current use of insulin (HCC) 10/06/2020   ? Urinary incontinence    ? Urinary problem     urinary frequency   ? Vision decreased     diplopia     Patient reports remote head trauma (with LOC reported)     Surgical History:   Procedure Laterality Date   ? HX ADENOIDECTOMY  1962   ? HX TONSILLECTOMY  1962   ? TUBAL LIGATION  1977   ? RECTAL SURGERY  1979    for rectal prolapse; bladder repair   ? CHOLECYSTECTOMY  1984   ? HYSTERECTOMY  2000   ? BREAST BIOPSY Right 2012   ? COLONOSCOPY  2016   ? CERVICAL 3 TO THORACIC 7 DECOMPRESSION AND FUSION Bilateral 07/18/2015    Performed by Storm Frisk, MD at [facility]   ? BLEPHAROPLASTY     ? HERNIA REPAIR  1984/2007   ? SEPTOPLASTY  1986/2013   ? UPPER GASTROINTESTINAL ENDOSCOPY         Social History     Socioeconomic History   ? Marital status: Married     Spouse name: Not on file   ? Number of children: 2   ? Years of education: Not on file   ? Highest education level: Not on file   Occupational History   ? Occupation: DISABLE    Tobacco Use   ? Smoking status: Never Smoker   ? Smokeless tobacco: Never Used   Vaping Use   ? Vaping Use: Never used   Substance and Sexual Activity   ? Alcohol use:  No     Alcohol/week: 0.0 standard drinks   ? Drug use: No   ? Sexual activity: Not on file   Other Topics Concern   ? Not on file   Social History Narrative   ? Not on file       Family History   Problem Relation Age of Onset   ? Cancer Mother    ? Thyroid Disease Mother    ? Hypertension Mother    ? Cataract Mother    ? Cancer Father         pancreatic   ? Cancer Other    ? Stroke Paternal Grandfather    ? Cancer Paternal Aunt    ? Coronary Artery Disease Paternal Grandmother    ? Strabismus Neg Hx    ? Retinal Detachment Neg Hx    ? Neurologic Disorder Neg Hx    ? Macular Degen Neg Hx    ? Diabetes Neg Hx    ? Glaucoma Neg Hx    ? Blindness Neg Hx    ? Amblyopia Neg Hx    ? Autoimmune Disease Neg Hx      ALLERGIES  Allergies   Allergen Reactions   ? Amoxicillin-Pot Clavulanate      Allergy recorded in SMS: Augmentin~Reactions: HIVES   ? Gemfibrozil      Allergy recorded in SMS: LOPID~Reactions: HIVES   ? Trazodone HALLUCINATIONS   ? Adhesive Tape (Rosins) RASH   ? Atorvastatin HIVES     States it caused her to have a fatty liver.   Reaction: LFT elevation; Comment: LIPITOR  Reaction: LFT elevation; Comment: LIPITOR  Reaction: LFT elevation; Comment: LIPITOR  Reaction: LFT elevation; Comment: LIPITOR     ? Latex RASH   ? Bupropion      Allergy recorded in SMS: WELLBUTRIN~Reactions: ABNORMAL SEIZUR   ? Cymbalta [Duloxetine] UNKNOWN   ? Levaquin [Levofloxacin] DIARRHEA     Objective:         ? acetaminophen (TYLENOL EXTRA STRENGTH) 500 mg tablet Take two tablets by mouth every 6 hours as needed. Max of 4,000 mg of acetaminophen in 24 hours.  Indications: pain   ? adalimumab (HUMIRA(CF) PEN) 40 mg/0.4 mL injection PEN kit Inject 0.4 mL under the skin every 14 days.   ? aspirin 81 mg chewable tablet Chew 81 mg by mouth at bedtime daily.   ? blood-glucose sensor (DEXCOM G6 SENSOR MISC) Use  as directed.   ? calcium carbonate/vitamin D-3 (OSCAL-500+D) 1250 mg/200 unit tablet Take one tablet by mouth twice daily. Calcium Carb 1250mg  delivers 500mg  elemental Ca   ? celecoxib (CELEBREX) 100 mg capsule Take 100 mg by mouth twice daily.   ? dicyclomine (BENTYL) 10 mg capsule Take 10 mg by mouth four times daily.   ? DIPH/LIDO/ANTACID 1:1:1 (COMPOUND) Swish and Spit 5 mL by mouth as directed every 4 hours as needed.   ? dulaglutide (TRULICITY) 1.5 mg/0.5 mL injection pen Inject 1.5 mg under the skin every Wednesday.   ? ergocalciferol (VITAMIN D-2) 50,000 unit capsule Take 50,000 Units by mouth every 7 days.   ? fenofibrate (TRIGLIDE) 160 mg tablet Take 1 tablet by mouth at bedtime daily.   ? fexofenadine(+) (ALLEGRA) 180 mg tablet Take 180 mg by mouth daily.   ? folic acid (FOLVITE) 1 mg tablet Take two tablets by mouth daily.   ? furosemide (LASIX) 40 mg tablet Take 40 mg by mouth daily as needed.   ? insulin aspart U-100 (NOVOLOG U-100  INSULIN ASPART) 100 unit/mL injection Inject twelve Units under the skin three times daily with meals. Take 12 units with breakfast, 22 units with lunch, and 30 units with dinner.  Indications: type 2 diabetes mellitus   ? insulin degludec (TRESIBA FLEXTOUCH) 200 unit/mL (3 mL) injection PEN Inject eighty Units under the skin at bedtime daily.   ? Insulin Needles (Disposable) (NANO PEN NEEDLE) 32 gauge x 5/32 ndle Use 1 Each as directed before meals and at bedtime.   ? levothyroxine (SYNTHROID) 88 mcg tablet Take 88 mcg by mouth daily.   ? lidocaine (LIDOCARE) 4 % topical patch Apply 1 patch topically to affected area daily.   ? Melatonin 5 mg cap Take  by mouth at bedtime daily.   ? methotrexate sodium 2.5 mg tablet Take eight tablets by mouth every 7 days.   ? mirtazapine 7.5 mg tablet Take one tablet by mouth at bedtime daily. Indications: insomnia, sleep maintenance   ? montelukast (SINGULAIR) 10 mg tablet Take 10 mg by mouth at bedtime daily.   ? MULTIVITAMINS WITH FLUORIDE (MULTI-VITAMIN PO) Take 1 tablet by mouth daily.   ? NYSTOP 100,000 unit/gram topical powder Apply to clean, dry skin 2-3 times daily for yeast infection   ? Omega-3 Acid Ethyl Esters (LOVAZA) 1 gram capsule Take 1 capsule by mouth daily.   ? oxybutynin XL (DITROPAN XL) 10 mg tablet Take 1 Tab by mouth daily. Do not cut/ crush/ chew   ? oxyCODONE/acetaminophen (PERCOCET) 5/325 mg tablet Take one tablet to two tablets by mouth every 6 hours as needed   ? pantoprazole DR (PROTONIX) 40 mg tablet Take 40 mg by mouth twice daily.   ? polyethylene glycol 3350 (MIRALAX) 17 g packet Take one packet by mouth twice daily as needed.   ? potassium chloride (KLOR-CON 10) 10 mEq tablet Take 2 tablets by mouth every day as needed if taking furosemide for edema.   ? pramipexole (MIRAPEX) 0.25 mg tablet Take 0.75 mg by mouth twice daily.   ? predniSONE (DELTASONE) 2.5 mg tablet Take 3 tablets by mouth daily for 2 weeks, then decrease to 2 tablets daily for 2 weeks, then decrease to 1 tablet daily for 2 weeks, then stop.   ? rosuvastatin (CRESTOR) 20 mg tablet Take 1 tablet by mouth daily.   ? senna/docusate (SENOKOT-S) 8.6/50 mg tablet Take one tablet by mouth twice daily.   ? venlafaxine XR (EFFEXOR XR) 150 mg capsule Take one capsule by mouth daily with breakfast. Take with food.  Indications: repeated episodes of anxiety, major depressive disorder, panic disorder   ? vitamin E 400 unit cap capsule Take 1 capsule by mouth at bedtime daily.     Vitals:    02/28/21 0856   BP: 135/74   BP Source: Arm, Right Upper   Patient Position: Sitting   Pulse: 83   Weight: 98 kg (216 lb)   Height: 170.2 cm (5' 7)   PainSc: Eight     Body mass index is 33.83 kg/m?[name]     Physical Exam  GENERAL APPEARANCE: The patient is alert. Patient is in no acute distress, following commands and cooperative. Well developed and well nourished.   HEENT: Normocephalic and atraumatic.  EXTREMITIES: no leg swelling.    NEUROLOGIC EXAM:   Orientation: The patient is alert and oriented times three. Patient is following commands. Speech with fair fluency, intact comprehension, repetition and naming. Patient was drowsy during interview.  Word recollection was 1/3 after 5 minutes. Able to  spell WORLD backwards (missed 1 letter).      Cranial nerves:  1st cranial nerve:  not tested   2nd cranial nerve: normal; Visual fields are full to confrontation. Pupils are equal, round, and reactive to light and accommodation.   3rd, 4th & 6th cranial nerves: Extraocular movements are intact without nystagmus.  5th cranial nerve: normal; intact muscles of mastication. Intact light touch and pin prick.  7th cranial nerve: normal; no facial asymmetry  8th cranial nerve: normal  9th & 10th cranial nerves: normal; Gag is present   11th cranial nerve: normal; Shoulder shrug symmetric   12th cranial nerve: normal; tongue is midline.      Strength: (Right/Left) Deltoid 4/4, Biceps 4/4, Triceps 4/4, Finger ext 4+/4+, interossei 4+/4+ (exam limited due to pain), Hip Flexion 5/5, Knee ext 5/5, Knee flex 5/5, Ankle dorsiflexion 5/5, Ankle plantarflexion 5/5. Normal bulk and tone in all four limbs without any evidence of an arm drift.  No abnormal movements during exam.  Sensory: Intact to pain, temperature and vibration in both upper/lower extremities.   Coordination is intact finger-to-nose and rapidly alternating movements.    Deep tendon reflexes are 2+ in biceps, triceps, brachioradialis and patellar bilaterally and 1+ ankle reflex.  Hoffman sign is absent bilaterally. Clonus is not elicited.   Gait is without ataxia, but cautious features. Unable to tandem walking.     LABS:  Sodium   Date Value Ref Range Status   11/11/2020 134 (L) 137 - 147 MMOL/L Final     Potassium   Date Value Ref Range Status   11/11/2020 4.2 3.5 - 5.1 MMOL/L Final     Chloride   Date Value Ref Range Status   11/11/2020 97 (L) 98 - 110 MMOL/L Final     CO2   Date Value Ref Range Status   11/11/2020 31 (H) 21 - 30 MMOL/L Final     Anion Gap   Date Value Ref Range Status   11/11/2020 6 3 - 12 Final     Blood Urea Nitrogen   Date Value Ref Range Status   11/11/2020 14 7 - 25 MG/DL Final     Creatinine   Date Value Ref Range Status   12/14/2020 0.60 0.4 - 1.00 MG/DL Final     Glucose   Date Value Ref Range Status   11/11/2020 114 (H) 70 - 100 MG/DL Final   16/09/9603 90 70 - 110 MG/DL Final     Calcium   Date Value Ref Range Status   11/11/2020 9.1 8.5 - 10.6 MG/DL Final     AST (SGOT)   Date Value Ref Range Status   11/24/2020 17 7 - 40 U/L Final     ALT (SGPT)   Date Value Ref Range Status   12/14/2020 22 7 - 56 U/L Final     Alk Phosphatase   Date Value Ref Range Status   11/24/2020 108 25 - 110 U/L Final     Albumin   Date Value Ref Range Status   11/24/2020 3.4 (L) 3.5 - 5.0 G/DL Final     Total Bilirubin   Date Value Ref Range Status   11/24/2020 0.3 0.3 - 1.2 MG/DL Final     Creatine Kinase   Date Value Ref Range Status   11/09/2020 41 21 - 215 U/L Final     Cholesterol   Date Value Ref Range Status   11/11/2020 128 <200 MG/DL Final     Triglycerides   Date  Value Ref Range Status   11/11/2020 95 <150 MG/DL Final     HDL   Date Value Ref Range Status   11/11/2020 42 >40 MG/DL Final     LDL   Date Value Ref Range Status   11/11/2020 74 <100 mg/dL Final     VLDL   Date Value Ref Range Status   11/11/2020 19 MG/DL Final     TSH   Date Value Ref Range Status   11/09/2020 0.99 0.35 - 5.00 MCU/ML Final     Hemoglobin A1C   Date Value Ref Range Status   11/11/2020 8.4 (H) 4.0 - 6.0 % Final     Comment:     The ADA recommends that most patients with type 1 and type 2 diabetes maintain   an A1c level <7%.        Assessment and Plan:  Visual hallucinations, worsening daytime somnolence and memory loss, possible due to polypharmacy. Patient with history of OSA/RLS and possible narcolepsy in the past, who presented with recent episodes of abnormal vision (visual hallucinations), she also describes worsening hypersomnia, balance issues and memory loss. Patient denies focal weakness, but also reports chronic paresthesias as well as short term memory loss. Neurological exam today with normal orientation and no clear focal deficits other than abnormal gait (but limited exam in UE due to pain issues). We will order MRI brain to exclude new intracranial abnormalities, but it seems likely that some of her symptoms are due to polypharmacy and/or underlying sleep issues. I advised patient to discuss with PCP and urologist about Oxybutynin since medication may cause hallucinations, we will also decrease Mirapex (taking for RLS) since medication may also explain hallucinations and daytime somnolence.     Recommendations:  1. We will order MRI brain wo contrast.  2. We will decrease Mirapex dose to 0.75mg  qhs due to hallucinations reported. Patient advised to follow up with sleep clinic due to sleep problems reported, she may try low dose gabapentin in the future if needed for RLS.    3. Fall precautions discussed during clinic visit.    4. We will order labs including B12 level and iron panel.  5. Patient will have a follow up appointment in 4 months.     Total time was 50 minutes. This time was spent preparing to see the patient, obtaining and/or reviewing history, performing an examination, ordering medications, tests/ procedures, communicating results to the patient/family, documenting clinical information in the electronic health record and counseling/educating the patient/family regarding memory loss and hallucinations.

## 2021-03-01 DIAGNOSIS — R531 Weakness: Secondary | ICD-10-CM

## 2021-03-01 DIAGNOSIS — R443 Hallucinations, unspecified: Secondary | ICD-10-CM

## 2021-03-01 DIAGNOSIS — R400 Somnolence: Principal | ICD-10-CM

## 2021-03-01 DIAGNOSIS — R5383 Other fatigue: Secondary | ICD-10-CM

## 2021-03-01 DIAGNOSIS — R799 Abnormal finding of blood chemistry, unspecified: Secondary | ICD-10-CM

## 2021-03-05 ENCOUNTER — Encounter: Admit: 2021-03-05 | Discharge: 2021-03-05 | Payer: MEDICARE

## 2021-03-08 ENCOUNTER — Encounter: Admit: 2021-03-08 | Discharge: 2021-03-08 | Payer: MEDICARE

## 2021-03-08 NOTE — Progress Notes
Created in error

## 2021-03-08 NOTE — Progress Notes
Pharmacy Medication Re-assessment    The patient's caregiver (daughter Hailey Rodgers ) participated on the patient's behalf. All references to the patient herein were completed with the caregiver on the patient's behalf.    Indication/Regimen  The regimen of ADALIMUMAB 40 MG/0.4 ML SC PNKT indefinitely is appropriate for Hailey Rodgers who has Rheumatoid arthritis(714.0).    Renal dose adjustments are not required. Hepatic dose adjustments are not required. Dose titration is not required.    The patient has the ability to self-administer the medication(s).    Baseline Characteristics  Current DMARDs: Humira and MTX   Previous DMARDs: none   Additional considerations: none   Baseline labs were completed and evaluated.    Therapeutic Goals and Monitoring  The goal of therapy is to improve or control symptoms and disease activity.    Symptom assessment: improved  The patient reports a not able to provide a percent improvement in symptoms since starting therapy.  swelling is better, still hurting   Disease burden: Patient reports they are able to complete normal daily activities such as school, work, or other activities some of the time.  There was not a flare in the past 30 days.    The patient is making progress toward achieving their therapeutic goals. appt 3/24 with Darl Pikes  The plan is to continue current therapy. tapered off prednisone completely had been on since october 2021    Labs and Diagnostic Tests  TB Screening  T Spot TB   Date Value Ref Range Status   11/10/2020   Final    Negative  Reference range: Normal Value: Negative  A negative test result does not exclude the possibility of exposure  to or infection with Mycobacterium tuberculosis (M. tuberculosis).   Patients with recent exposure to TB infected individuals exhibiting a  negative T-SPOT.TB result should be considered for retesting within 6  weeks or if other relevant clinical symptoms indicate.  Results from  T-SPOT.TB testing must be used in conjunction with each individual's  epidemiological history, current medical status, and results of other  diagnostic evaluations.[id number] T-SPOT.TB test is qualitative  and results are reported as positive, borderline or negative, given  that the test controls perform as expected. In line with the Centers  for Disease Control and Prevention's 2010 recommendation to report  quantitative measurements alongside the qualitative result, the  laboratory provides spot counts for informational purposes only.  The  T-SPOT.TB test should not be interpreted as a quantitative test.         Hepatitis B Screening  HBsAg   Date/Time Value Ref Range Status   10/07/2020 04:07 AM NONREACTIVE NR-NONREACTIVE Final     Comment:     HBs antigen not detected.     Anti HBc Total   Date Value Ref Range Status   10/07/2020 NONREACTIVE NR-NONREACTIVE Final     Comment:     Antibodies to HBV core antigen (anti-HBc) were not detected.     Anti HBs   Date/Time Value Ref Range Status   10/07/2020 04:07 AM NEG NEG-NEG Final     Comment:     Individual is considered to be non-immune to HBV infection.        Other Pertinent Labs   NA     Allergies  Allergies   Allergen Reactions   ? Amoxicillin-Pot Clavulanate      Allergy recorded in SMS: Augmentin~Reactions: HIVES   ? Gemfibrozil      Allergy recorded in SMS: LOPID~Reactions: HIVES   ?  Trazodone HALLUCINATIONS   ? Adhesive Tape (Rosins) RASH   ? Atorvastatin HIVES     States it caused her to have a fatty liver.   Reaction: LFT elevation; Comment: LIPITOR  Reaction: LFT elevation; Comment: LIPITOR  Reaction: LFT elevation; Comment: LIPITOR  Reaction: LFT elevation; Comment: LIPITOR     ? Latex RASH   ? Bupropion      Allergy recorded in SMS: WELLBUTRIN~Reactions: ABNORMAL SEIZUR   ? Cymbalta [Duloxetine] UNKNOWN   ? Levaquin [Levofloxacin] DIARRHEA        Immunizations  Vaccine history was reviewed with the patient.    Immunization History   Administered Date(s) Administered   ? COVID-19 (MODERNA), mRNA vacc, 100 mcg/0.5 mL (PF) 10/07/2020   ? Flu Vaccine =>65 YO High-Dose Quadrivalent (PF) 10/25/2020       Medication Reconciliation  Medication history and reconciliation were performed (including prescription medications, supplements, over the counter, and herbal products). The medication list was updated and the patient's current medication list is included below.     Home Medications    Medication Sig   acetaminophen (TYLENOL EXTRA STRENGTH) 500 mg tablet Take two tablets by mouth every 6 hours as needed. Max of 4,000 mg of acetaminophen in 24 hours.  Indications: pain   adalimumab (HUMIRA(CF) PEN) 40 mg/0.4 mL injection PEN kit Inject 0.4 mL under the skin every 14 days.   aspirin 81 mg chewable tablet Chew 81 mg by mouth at bedtime daily.   blood-glucose sensor (DEXCOM G6 SENSOR MISC) Use  as directed.   calcium carbonate/vitamin D-3 (OSCAL-500+D) 1250 mg/200 unit tablet Take one tablet by mouth twice daily. Calcium Carb 1250mg  delivers 500mg  elemental Ca   celecoxib (CELEBREX) 100 mg capsule Take 100 mg by mouth twice daily.   dicyclomine (BENTYL) 10 mg capsule Take 10 mg by mouth four times daily.   DIPH/LIDO/ANTACID 1:1:1 (COMPOUND) Swish and Spit 5 mL by mouth as directed every 4 hours as needed.   dulaglutide (TRULICITY) 1.5 mg/0.5 mL injection pen Inject 1.5 mg under the skin every Wednesday.   ergocalciferol (VITAMIN D-2) 50,000 unit capsule Take 50,000 Units by mouth every 7 days.   fenofibrate (TRIGLIDE) 160 mg tablet Take 1 tablet by mouth at bedtime daily.   fexofenadine(+) (ALLEGRA) 180 mg tablet Take 180 mg by mouth daily.   folic acid (FOLVITE) 1 mg tablet Take two tablets by mouth daily.   furosemide (LASIX) 40 mg tablet Take 40 mg by mouth daily as needed.   insulin aspart U-100 (NOVOLOG U-100 INSULIN ASPART) 100 unit/mL injection Inject twelve Units under the skin three times daily with meals. Take 12 units with breakfast, 22 units with lunch, and 30 units with dinner. Indications: type 2 diabetes mellitus  Patient taking differently: Inject 25 Units under the skin three times daily with meals. +sliding scale at 150>  Indications: type 2 diabetes mellitus   insulin degludec (TRESIBA FLEXTOUCH) 200 unit/mL (3 mL) injection PEN Inject eighty Units under the skin at bedtime daily.  Patient taking differently: Inject 100 Units under the skin at bedtime daily.   Insulin Needles (Disposable) (NANO PEN NEEDLE) 32 gauge x 5/32 ndle Use 1 Each as directed before meals and at bedtime.   L.acid/L.casei/B.bif/B.lon/FOS (PROBIOTIC BLEND PO) Take 1 capsule by mouth daily.   levothyroxine (SYNTHROID) 88 mcg tablet Take 88 mcg by mouth daily.   lidocaine (LIDOCARE) 4 % topical patch Apply 1 patch topically to affected area daily.   LORazepam (ATIVAN) 0.5 mg tablet Premedication for  MRI. Take 1 tablet 15-30 minutes before MRI, may take extra tablet if needed  Indications: anxious, Premedication for MRI   methotrexate sodium 2.5 mg tablet Take eight tablets by mouth every 7 days.   mirtazapine 7.5 mg tablet Take one tablet by mouth at bedtime daily. Indications: insomnia, sleep maintenance   montelukast (SINGULAIR) 10 mg tablet Take 10 mg by mouth at bedtime daily.   MULTIVITAMINS WITH FLUORIDE (MULTI-VITAMIN PO) Take 1 tablet by mouth daily.   NYSTOP 100,000 unit/gram topical powder Apply to clean, dry skin 2-3 times daily for yeast infection   Omega-3 Acid Ethyl Esters (LOVAZA) 1 gram capsule Take 1 capsule by mouth daily.   oxybutynin XL (DITROPAN XL) 10 mg tablet Take 1 Tab by mouth daily. Do not cut/ crush/ chew   oxyCODONE/acetaminophen (PERCOCET) 5/325 mg tablet Take one tablet to two tablets by mouth every 6 hours as needed   pantoprazole DR (PROTONIX) 40 mg tablet Take 40 mg by mouth twice daily.   polyethylene glycol 3350 (MIRALAX) 17 g packet Take one packet by mouth twice daily as needed.   potassium chloride (KLOR-CON 10) 10 mEq tablet Take 2 tablets by mouth every day as needed if taking furosemide for edema.   pramipexole (MIRAPEX) 0.25 mg tablet Take 0.75 mg by mouth three times daily.   predniSONE (DELTASONE) 2.5 mg tablet Take 3 tablets by mouth daily for 2 weeks, then decrease to 2 tablets daily for 2 weeks, then decrease to 1 tablet daily for 2 weeks, then stop.   rosuvastatin (CRESTOR) 20 mg tablet Take 1 tablet by mouth daily.   senna/docusate (SENOKOT-S) 8.6/50 mg tablet Take one tablet by mouth twice daily.   venlafaxine XR (EFFEXOR XR) 150 mg capsule Take one capsule by mouth daily with breakfast. Take with food.  Indications: repeated episodes of anxiety, major depressive disorder, panic disorder   vitamin E 400 unit cap capsule Take 1 capsule by mouth at bedtime daily.     The patient was reminded to speak with their health care provider before starting any new drug, including prescription or over the counter, natural / herbal products, or vitamins.    Drug Interactions    Drug-Drug Interactions  Drug-drug interactions were evaluated. There were not clinically significant drug-drug interactions.     Drug-Food Interactions  Drug-food interactions were evaluated. There are not clinically significant drug-food interactions. should be taken NA - not oral.    Adverse Drug Events  Adverse drug events were reviewed with the patient.    The patient does not have injection related concerns.    The patient does not have infection related concerns.    Significant adverse drug event(s) were not identified.    Adherence  Refill and adherence history were reviewed with the patient. The patient was educated on the importance of adherence.    Patient is adherent with refills: yes  Patient is meeting refill adherence goal: yes   Patient is meeting reported adherence goal.    Safety Precautions    Risk Evaluation and Mitigation (REMS) Assessment: REMS is not required for this medication.    Safety precautions were addressed and discussed with the patient as applicable.    Contraindications: Hailey Rodgers does not have contraindications to this medication.      Infection Screening:    TB screening has been completed and result was negative - proceed with treatment.    Hepatitis B screening has been completed and result was negative - proceed with treatment.  Pregnancy Status: Female, not of child-bearing potential, education not applicable.    Follow-up Plan    Hailey Rodgers was given the opportunity to ask questions and did not have any questions at the time. Patient was reminded of the refill process and encouraged to call with questions.    The patient will be reassessed within 1 year.    The medication(s) will be shipped from [facility]. has had some recental mental status changes and confusion. saw neurology and they  do not think related to Humira. she has an MRI scheduled for next week monday 3/21    Hailey Rodgers, [facility]

## 2021-03-12 ENCOUNTER — Ambulatory Visit: Admit: 2021-03-12 | Discharge: 2021-03-12 | Payer: MEDICARE

## 2021-03-12 ENCOUNTER — Encounter: Admit: 2021-03-12 | Discharge: 2021-03-12 | Payer: MEDICARE

## 2021-03-12 DIAGNOSIS — R400 Somnolence: Secondary | ICD-10-CM

## 2021-03-12 DIAGNOSIS — R413 Other amnesia: Secondary | ICD-10-CM

## 2021-03-15 ENCOUNTER — Encounter: Admit: 2021-03-15 | Discharge: 2021-03-15 | Payer: MEDICARE

## 2021-03-15 ENCOUNTER — Ambulatory Visit: Admit: 2021-03-15 | Discharge: 2021-03-15 | Payer: MEDICARE

## 2021-03-15 DIAGNOSIS — M069 Rheumatoid arthritis, unspecified: Principal | ICD-10-CM

## 2021-03-15 DIAGNOSIS — D849 Immunodeficiency, unspecified: Secondary | ICD-10-CM

## 2021-03-15 DIAGNOSIS — R54 Age-related physical debility: Secondary | ICD-10-CM

## 2021-03-15 DIAGNOSIS — E034 Atrophy of thyroid (acquired): Secondary | ICD-10-CM

## 2021-03-15 DIAGNOSIS — R531 Weakness: Secondary | ICD-10-CM

## 2021-03-15 DIAGNOSIS — E669 Obesity, unspecified: Secondary | ICD-10-CM

## 2021-03-15 DIAGNOSIS — U071 COVID-19 virus infection: Secondary | ICD-10-CM

## 2021-03-15 DIAGNOSIS — G629 Polyneuropathy, unspecified: Secondary | ICD-10-CM

## 2021-03-15 DIAGNOSIS — R443 Hallucinations, unspecified: Secondary | ICD-10-CM

## 2021-03-15 DIAGNOSIS — Z79899 Other long term (current) drug therapy: Secondary | ICD-10-CM

## 2021-03-15 DIAGNOSIS — R569 Unspecified convulsions: Secondary | ICD-10-CM

## 2021-03-15 DIAGNOSIS — R4189 Other symptoms and signs involving cognitive functions and awareness: Secondary | ICD-10-CM

## 2021-03-15 DIAGNOSIS — E1165 Type 2 diabetes mellitus with hyperglycemia: Secondary | ICD-10-CM

## 2021-03-15 DIAGNOSIS — E119 Type 2 diabetes mellitus without complications: Secondary | ICD-10-CM

## 2021-03-15 DIAGNOSIS — K5790 Diverticulosis of intestine, part unspecified, without perforation or abscess without bleeding: Secondary | ICD-10-CM

## 2021-03-15 DIAGNOSIS — G479 Sleep disorder, unspecified: Secondary | ICD-10-CM

## 2021-03-15 DIAGNOSIS — G4733 Obstructive sleep apnea (adult) (pediatric): Secondary | ICD-10-CM

## 2021-03-15 DIAGNOSIS — G47419 Narcolepsy without cataplexy: Secondary | ICD-10-CM

## 2021-03-15 DIAGNOSIS — K219 Gastro-esophageal reflux disease without esophagitis: Secondary | ICD-10-CM

## 2021-03-15 DIAGNOSIS — F3341 Major depressive disorder, recurrent, in partial remission: Secondary | ICD-10-CM

## 2021-03-15 DIAGNOSIS — M4802 Spinal stenosis, cervical region: Secondary | ICD-10-CM

## 2021-03-15 DIAGNOSIS — R3989 Other symptoms and signs involving the genitourinary system: Secondary | ICD-10-CM

## 2021-03-15 DIAGNOSIS — R32 Unspecified urinary incontinence: Secondary | ICD-10-CM

## 2021-03-15 DIAGNOSIS — E039 Hypothyroidism, unspecified: Secondary | ICD-10-CM

## 2021-03-15 DIAGNOSIS — Z5181 Encounter for therapeutic drug level monitoring: Secondary | ICD-10-CM

## 2021-03-15 DIAGNOSIS — H547 Unspecified visual loss: Secondary | ICD-10-CM

## 2021-03-15 DIAGNOSIS — E785 Hyperlipidemia, unspecified: Secondary | ICD-10-CM

## 2021-03-15 DIAGNOSIS — J45909 Unspecified asthma, uncomplicated: Secondary | ICD-10-CM

## 2021-03-15 DIAGNOSIS — M1990 Unspecified osteoarthritis, unspecified site: Secondary | ICD-10-CM

## 2021-03-15 DIAGNOSIS — R29898 Other symptoms and signs involving the musculoskeletal system: Secondary | ICD-10-CM

## 2021-03-15 DIAGNOSIS — J309 Allergic rhinitis, unspecified: Secondary | ICD-10-CM

## 2021-03-15 DIAGNOSIS — E7849 Other hyperlipidemia: Secondary | ICD-10-CM

## 2021-03-15 DIAGNOSIS — R911 Solitary pulmonary nodule: Secondary | ICD-10-CM

## 2021-03-15 DIAGNOSIS — M159 Polyosteoarthritis, unspecified: Secondary | ICD-10-CM

## 2021-03-15 DIAGNOSIS — K760 Fatty (change of) liver, not elsewhere classified: Secondary | ICD-10-CM

## 2021-03-15 DIAGNOSIS — R5383 Other fatigue: Secondary | ICD-10-CM

## 2021-03-15 DIAGNOSIS — R413 Other amnesia: Secondary | ICD-10-CM

## 2021-03-15 DIAGNOSIS — M48061 Spinal stenosis, lumbar region without neurogenic claudication: Secondary | ICD-10-CM

## 2021-03-15 DIAGNOSIS — J40 Bronchitis, not specified as acute or chronic: Secondary | ICD-10-CM

## 2021-03-15 LAB — CBC AND DIFF
Lab: 0 K/UL (ref 0–0.20)
Lab: 0.3 K/UL (ref 0–0.45)
Lab: 0.8 K/UL (ref 0–0.80)
Lab: 1 % (ref 0–2)
Lab: 11 % (ref 4–12)
Lab: 15 % — ABNORMAL HIGH (ref 11–15)
Lab: 2.1 K/UL (ref 1.0–4.8)
Lab: 25 pg — ABNORMAL LOW (ref 26–34)
Lab: 29 % — ABNORMAL LOW (ref 36–45)
Lab: 3.7 K/UL (ref 1.8–7.0)
Lab: 3.7 M/UL — ABNORMAL LOW (ref 4.0–5.0)
Lab: 30 % (ref 24–44)
Lab: 32 g/dL (ref 32.0–36.0)
Lab: 454 K/UL — ABNORMAL HIGH (ref 150–400)
Lab: 5 % (ref >60)
Lab: 53 % — ABNORMAL HIGH (ref 41–77)
Lab: 7 K/UL (ref 4.5–11.0)
Lab: 7.8 FL (ref 7–11)
Lab: 78 FL — ABNORMAL LOW (ref 80–100)
Lab: 9.7 g/dL — ABNORMAL LOW (ref 12.0–15.0)

## 2021-03-15 LAB — SED RATE: Lab: 65 mm/h — ABNORMAL HIGH (ref 0–30)

## 2021-03-15 LAB — COMPREHENSIVE METABOLIC PANEL
Lab: 136 MMOL/L — ABNORMAL LOW (ref 137–147)
Lab: 4.8 MMOL/L (ref 3.5–5.1)

## 2021-03-15 LAB — C REACTIVE PROTEIN (CRP): Lab: 3.3 mg/dL — ABNORMAL HIGH (ref <1.0)

## 2021-03-15 MED ORDER — PREDNISONE 2.5 MG PO TAB
ORAL_TABLET | Freq: Every day | 1 refills | Status: AC
Start: 2021-03-15 — End: ?
  Filled 2021-05-28: qty 100, 23d supply, fill #1

## 2021-03-20 ENCOUNTER — Encounter: Admit: 2021-03-20 | Discharge: 2021-03-20 | Payer: MEDICARE

## 2021-03-20 MED FILL — HUMIRA(CF) PEN 40 MG/0.4 ML SC PNKT: 40 mg/0.4 mL | SUBCUTANEOUS | 28 days supply | Qty: 1 | Fill #4 | Status: CP

## 2021-04-16 ENCOUNTER — Encounter: Admit: 2021-04-16 | Discharge: 2021-04-16 | Payer: MEDICARE

## 2021-04-16 MED ORDER — HUMIRA(CF) PEN 40 MG/0.4 ML SC PNKT
40 mg | PACK | SUBCUTANEOUS | 5 refills | Status: CN
Start: 2021-04-16 — End: ?

## 2021-04-16 MED ORDER — HUMIRA(CF) PEN 40 MG/0.4 ML SC PNKT
40 mg | PACK | SUBCUTANEOUS | 3 refills | Status: DC
Start: 2021-04-16 — End: 2021-04-16
  Filled 2021-04-17: qty 2, 28d supply, fill #1

## 2021-04-16 NOTE — Telephone Encounter
I'm sorry she is going through this pain. When she gets to 5 mg of prednisone, she should stay on this amount of prednisone. I want to optimize her Humira. Let us try to switch Humira to every week. When she sees me in June and the increased Humira isn't working then we can try something else.     Kathlee Nations,   I changed the frequency of the Humira from every 14 days to every week. Is there is anything else I need to do to get the medication approved? Thanks!    Craige Cotta, MD  Rheumatology Fellow, PGY-5  Pager Number: [phone]

## 2021-04-16 NOTE — Progress Notes
No PA needed for the weekly dosing.  Will have ready to pick up at Hailey Rodgers tomorrow

## 2021-04-16 NOTE — Progress Notes
Increasing adalimumab (Humira) from every 14 days to every 7 days.   Sending to [place] to check if needs a new PA

## 2021-04-17 ENCOUNTER — Encounter: Admit: 2021-04-17 | Discharge: 2021-04-17 | Payer: MEDICARE

## 2021-05-16 ENCOUNTER — Encounter: Admit: 2021-05-16 | Discharge: 2021-05-16 | Payer: MEDICARE

## 2021-05-17 ENCOUNTER — Encounter: Admit: 2021-05-17 | Discharge: 2021-05-17 | Payer: MEDICARE

## 2021-05-17 MED FILL — HUMIRA(CF) PEN 40 MG/0.4 ML SC PNKT: 40 mg/0.4 mL | SUBCUTANEOUS | 28 days supply | Qty: 2 | Fill #2 | Status: CP

## 2021-05-22 ENCOUNTER — Emergency Department: Admit: 2021-05-22 | Discharge: 2021-05-22 | Payer: MEDICARE

## 2021-05-22 ENCOUNTER — Inpatient Hospital Stay: Admit: 2021-05-22 | Payer: MEDICARE

## 2021-05-22 ENCOUNTER — Encounter: Admit: 2021-05-22 | Discharge: 2021-05-22 | Payer: MEDICARE

## 2021-05-22 DIAGNOSIS — R569 Unspecified convulsions: Secondary | ICD-10-CM

## 2021-05-22 DIAGNOSIS — G479 Sleep disorder, unspecified: Secondary | ICD-10-CM

## 2021-05-22 DIAGNOSIS — G629 Polyneuropathy, unspecified: Secondary | ICD-10-CM

## 2021-05-22 DIAGNOSIS — M48061 Spinal stenosis, lumbar region without neurogenic claudication: Secondary | ICD-10-CM

## 2021-05-22 DIAGNOSIS — R32 Unspecified urinary incontinence: Secondary | ICD-10-CM

## 2021-05-22 DIAGNOSIS — J309 Allergic rhinitis, unspecified: Secondary | ICD-10-CM

## 2021-05-22 DIAGNOSIS — M2550 Pain in unspecified joint: Secondary | ICD-10-CM

## 2021-05-22 DIAGNOSIS — R54 Age-related physical debility: Secondary | ICD-10-CM

## 2021-05-22 DIAGNOSIS — J40 Bronchitis, not specified as acute or chronic: Secondary | ICD-10-CM

## 2021-05-22 DIAGNOSIS — E119 Type 2 diabetes mellitus without complications: Secondary | ICD-10-CM

## 2021-05-22 DIAGNOSIS — E034 Atrophy of thyroid (acquired): Secondary | ICD-10-CM

## 2021-05-22 DIAGNOSIS — R413 Other amnesia: Secondary | ICD-10-CM

## 2021-05-22 DIAGNOSIS — R3989 Other symptoms and signs involving the genitourinary system: Secondary | ICD-10-CM

## 2021-05-22 DIAGNOSIS — E7849 Other hyperlipidemia: Secondary | ICD-10-CM

## 2021-05-22 DIAGNOSIS — U071 COVID-19 virus infection: Secondary | ICD-10-CM

## 2021-05-22 DIAGNOSIS — G47419 Narcolepsy without cataplexy: Secondary | ICD-10-CM

## 2021-05-22 DIAGNOSIS — M4802 Spinal stenosis, cervical region: Secondary | ICD-10-CM

## 2021-05-22 DIAGNOSIS — E039 Hypothyroidism, unspecified: Secondary | ICD-10-CM

## 2021-05-22 DIAGNOSIS — G4733 Obstructive sleep apnea (adult) (pediatric): Secondary | ICD-10-CM

## 2021-05-22 DIAGNOSIS — E1165 Type 2 diabetes mellitus with hyperglycemia: Secondary | ICD-10-CM

## 2021-05-22 DIAGNOSIS — K219 Gastro-esophageal reflux disease without esophagitis: Secondary | ICD-10-CM

## 2021-05-22 DIAGNOSIS — M1990 Unspecified osteoarthritis, unspecified site: Secondary | ICD-10-CM

## 2021-05-22 DIAGNOSIS — E669 Obesity, unspecified: Secondary | ICD-10-CM

## 2021-05-22 DIAGNOSIS — K5790 Diverticulosis of intestine, part unspecified, without perforation or abscess without bleeding: Secondary | ICD-10-CM

## 2021-05-22 DIAGNOSIS — Z79899 Other long term (current) drug therapy: Secondary | ICD-10-CM

## 2021-05-22 DIAGNOSIS — R911 Solitary pulmonary nodule: Secondary | ICD-10-CM

## 2021-05-22 DIAGNOSIS — F3341 Major depressive disorder, recurrent, in partial remission: Secondary | ICD-10-CM

## 2021-05-22 DIAGNOSIS — R29898 Other symptoms and signs involving the musculoskeletal system: Secondary | ICD-10-CM

## 2021-05-22 DIAGNOSIS — K760 Fatty (change of) liver, not elsewhere classified: Secondary | ICD-10-CM

## 2021-05-22 DIAGNOSIS — R4189 Other symptoms and signs involving cognitive functions and awareness: Secondary | ICD-10-CM

## 2021-05-22 DIAGNOSIS — H547 Unspecified visual loss: Secondary | ICD-10-CM

## 2021-05-22 DIAGNOSIS — J45909 Unspecified asthma, uncomplicated: Secondary | ICD-10-CM

## 2021-05-22 DIAGNOSIS — E785 Hyperlipidemia, unspecified: Secondary | ICD-10-CM

## 2021-05-22 DIAGNOSIS — M069 Rheumatoid arthritis, unspecified: Secondary | ICD-10-CM

## 2021-05-22 LAB — COMPREHENSIVE METABOLIC PANEL
ALBUMIN: 3.6 g/dL (ref 3.5–5.0)
ALK PHOSPHATASE: 127 U/L — ABNORMAL HIGH (ref 25–110)
ALT: 25 U/L (ref 7–56)
ANION GAP: 9 K/UL (ref 3–12)
BLD UREA NITROGEN: 17 mg/dL (ref 7–25)
CALCIUM: 9.1 mg/dL — ABNORMAL HIGH (ref 8.5–10.6)
CO2: 26 MMOL/L (ref 21–30)
CREATININE: 0.5 mg/dL (ref 0.4–1.00)
EGFR: >60 mL/min (ref >60)
GLUCOSE,PANEL: 280 mg/dL — ABNORMAL HIGH (ref 70–100)
SODIUM: 132 MMOL/L — ABNORMAL LOW (ref 137–147)
TOTAL BILIRUBIN: 0.5 mg/dL (ref 0.3–1.2)
TOTAL PROTEIN: 6.2 g/dL (ref 6.0–8.0)

## 2021-05-22 LAB — RHEUMATOID FACTOR (RF): RF SCREEN: 598 [IU]/mL — ABNORMAL HIGH (ref <25)

## 2021-05-22 LAB — POC BLOOD GAS VEN
BASE EXCESS, VEN POC: 4 MMOL/L
BICARB, VEN POC: 28 MMOL/L
O2 SAT, VEN POC: 91 % — ABNORMAL HIGH (ref 55–71)
PCO2, VEN POC: 41 mmHg (ref 36–50)
PH, VEN POC: 7.4 — ABNORMAL HIGH (ref 7.30–7.40)
PO2, VEN POC: 59 mmHg — ABNORMAL HIGH (ref 33–48)

## 2021-05-22 LAB — BLOOD GASES, PERIPHERAL VENOUS: PH-VENOUS: 7.4 — ABNORMAL HIGH (ref 7.30–7.40)

## 2021-05-22 LAB — CBC AND DIFF
HEMATOCRIT: 33 % — ABNORMAL LOW (ref 36–45)
MONOCYTES %: 6 % (ref 4–12)
WBC COUNT: 9.3 K/UL (ref 4.5–11.0)

## 2021-05-22 LAB — POC GLUCOSE
POC GLUCOSE: 110 mg/dL — ABNORMAL HIGH (ref 70–100)
POC GLUCOSE: 97 mg/dL (ref 70–100)
POC GLUCOSE: 140 mg/dL — ABNORMAL HIGH (ref 70–100)

## 2021-05-22 LAB — POC LACTATE: LACTIC ACID POC: 0.6 MMOL/L (ref 0.5–2.0)

## 2021-05-22 LAB — C REACTIVE PROTEIN (CRP): C-REACTIVE PROTEIN: 1.2 mg/dL — ABNORMAL HIGH (ref <1.0)

## 2021-05-22 MED ORDER — VANCOMYCIN 2,500 MG IVPB
2500 mg | Freq: Once | INTRAVENOUS | 0 refills | Status: CP
Start: 2021-05-22 — End: ?
  Administered 2021-05-22 (×2): 2500 mg via INTRAVENOUS

## 2021-05-22 MED ORDER — MELATONIN 5 MG PO TAB
5 mg | Freq: Every evening | ORAL | 0 refills | Status: AC | PRN
Start: 2021-05-22 — End: ?
  Administered 2021-05-24: 02:00:00 5 mg via ORAL

## 2021-05-22 MED ORDER — OXYBUTYNIN CHLORIDE 10 MG PO TR24
10 mg | Freq: Every day | ORAL | 0 refills | Status: AC
Start: 2021-05-22 — End: ?
  Administered 2021-05-23 – 2021-05-24 (×2): 10 mg via ORAL

## 2021-05-22 MED ORDER — INSULIN ASPART 100 UNIT/ML SC FLEXPEN
20 [IU] | Freq: Three times a day (TID) | SUBCUTANEOUS | 0 refills | Status: DC
Start: 2021-05-22 — End: 2021-05-22

## 2021-05-22 MED ORDER — ONDANSETRON HCL (PF) 4 MG/2 ML IJ SOLN
4 mg | INTRAVENOUS | 0 refills | Status: AC | PRN
Start: 2021-05-22 — End: ?

## 2021-05-22 MED ORDER — VENLAFAXINE 150 MG PO CP24
150 mg | Freq: Every day | ORAL | 0 refills | Status: AC
Start: 2021-05-22 — End: ?
  Administered 2021-05-23 – 2021-05-28 (×6): 150 mg via ORAL

## 2021-05-22 MED ORDER — FOLIC ACID 1 MG PO TAB
2 mg | Freq: Every day | ORAL | 0 refills | Status: AC
Start: 2021-05-22 — End: ?
  Administered 2021-05-23: 04:00:00 2 mg via ORAL

## 2021-05-22 MED ORDER — CELECOXIB 100 MG PO CAP
100 mg | Freq: Two times a day (BID) | ORAL | 0 refills | Status: AC
Start: 2021-05-22 — End: ?
  Administered 2021-05-23 – 2021-05-28 (×12): 100 mg via ORAL

## 2021-05-22 MED ORDER — INSULIN GLARGINE 100 UNIT/ML (3 ML) SC INJ PEN
60 [IU] | Freq: Every evening | SUBCUTANEOUS | 0 refills | Status: AC
Start: 2021-05-22 — End: ?
  Administered 2021-05-23: 04:00:00 60 [IU] via SUBCUTANEOUS

## 2021-05-22 MED ORDER — ROSUVASTATIN 20 MG PO TAB
20 mg | Freq: Every day | ORAL | 0 refills | Status: AC
Start: 2021-05-22 — End: ?
  Administered 2021-05-23 – 2021-05-28 (×6): 20 mg via ORAL

## 2021-05-22 MED ORDER — HEPARIN, PORCINE (PF) 5,000 UNIT/0.5 ML IJ SYRG
5000 [IU] | SUBCUTANEOUS | 0 refills | Status: AC
Start: 2021-05-22 — End: ?
  Administered 2021-05-23 – 2021-05-28 (×16): 5000 [IU] via SUBCUTANEOUS

## 2021-05-22 MED ORDER — PRAMIPEXOLE 0.25 MG PO TAB
.75 mg | Freq: Three times a day (TID) | ORAL | 0 refills | Status: AC
Start: 2021-05-22 — End: ?
  Administered 2021-05-23 – 2021-05-28 (×17): 0.75 mg via ORAL

## 2021-05-22 MED ORDER — SENNOSIDES-DOCUSATE SODIUM 8.6-50 MG PO TAB
1 | Freq: Two times a day (BID) | ORAL | 0 refills | Status: AC
Start: 2021-05-22 — End: ?
  Administered 2021-05-23 – 2021-05-28 (×12): 1 via ORAL

## 2021-05-22 MED ORDER — LEVOTHYROXINE 88 MCG PO TAB
88 ug | Freq: Every day | ORAL | 0 refills | Status: AC
Start: 2021-05-22 — End: ?
  Administered 2021-05-23 – 2021-05-28 (×6): 88 ug via ORAL

## 2021-05-22 MED ORDER — DICYCLOMINE 10 MG PO CAP
10 mg | Freq: Four times a day (QID) | ORAL | 0 refills | Status: AC
Start: 2021-05-22 — End: ?
  Administered 2021-05-24 (×2): 10 mg via ORAL

## 2021-05-22 MED ORDER — INSULIN ASPART 100 UNIT/ML SC FLEXPEN
0-12 [IU] | Freq: Before meals | SUBCUTANEOUS | 0 refills | Status: AC
Start: 2021-05-22 — End: ?
  Administered 2021-05-27: 4 [IU] via SUBCUTANEOUS

## 2021-05-22 MED ORDER — CALCIUM CARBONATE-VITAMIN D3 500 MG-5 MCG (200 UNIT) PO TAB
1 | Freq: Two times a day (BID) | ORAL | 0 refills | Status: AC
Start: 2021-05-22 — End: ?
  Administered 2021-05-23 – 2021-05-28 (×12): 1 via ORAL

## 2021-05-22 MED ORDER — ACETAMINOPHEN 500 MG PO TAB
1000 mg | Freq: Once | ORAL | 0 refills | Status: CP
Start: 2021-05-22 — End: ?
  Administered 2021-05-22: 18:00:00 1000 mg via ORAL

## 2021-05-22 MED ORDER — POLYETHYLENE GLYCOL 3350 17 GRAM PO PWPK
1 | Freq: Every day | ORAL | 0 refills | Status: AC | PRN
Start: 2021-05-22 — End: ?

## 2021-05-22 MED ORDER — MIRTAZAPINE 7.5 MG PO TAB
7.5 mg | Freq: Every evening | ORAL | 0 refills | Status: AC
Start: 2021-05-22 — End: ?
  Administered 2021-05-24: 02:00:00 7.5 mg via ORAL

## 2021-05-22 MED ORDER — ACETAMINOPHEN 325 MG PO TAB
650 mg | ORAL | 0 refills | Status: AC | PRN
Start: 2021-05-22 — End: ?
  Administered 2021-05-23 – 2021-05-26 (×6): 650 mg via ORAL

## 2021-05-22 MED ORDER — ASPIRIN 81 MG PO CHEW
81 mg | Freq: Every evening | ORAL | 0 refills | Status: AC
Start: 2021-05-22 — End: ?
  Administered 2021-05-23 – 2021-05-28 (×6): 81 mg via ORAL

## 2021-05-22 MED ORDER — PANTOPRAZOLE 40 MG PO TBEC
40 mg | Freq: Two times a day (BID) | ORAL | 0 refills | Status: AC
Start: 2021-05-22 — End: ?
  Administered 2021-05-23 – 2021-05-28 (×12): 40 mg via ORAL

## 2021-05-22 MED ORDER — ONDANSETRON 4 MG PO TBDI
4 mg | ORAL | 0 refills | Status: AC | PRN
Start: 2021-05-22 — End: ?

## 2021-05-22 MED ORDER — LIDOCAINE 5 % TP PTMD
1 | Freq: Every day | TOPICAL | 0 refills | Status: AC
Start: 2021-05-22 — End: ?
  Administered 2021-05-27: 15:00:00 1 via TOPICAL

## 2021-05-22 MED ORDER — INSULIN ASPART 100 UNIT/ML SC FLEXPEN
0-7 [IU] | Freq: Before meals | SUBCUTANEOUS | 0 refills | Status: DC
Start: 2021-05-22 — End: 2021-05-22

## 2021-05-22 MED ORDER — KETOROLAC 15 MG/ML IJ SOLN
15 mg | Freq: Once | INTRAVENOUS | 0 refills | Status: CP
Start: 2021-05-22 — End: ?
  Administered 2021-05-22: 21:00:00 15 mg via INTRAVENOUS

## 2021-05-22 MED ORDER — MONTELUKAST 10 MG PO TAB
10 mg | Freq: Every evening | ORAL | 0 refills | Status: AC
Start: 2021-05-22 — End: ?
  Administered 2021-05-24: 02:00:00 10 mg via ORAL

## 2021-05-22 MED ORDER — LORATADINE 10 MG PO TAB
10 mg | Freq: Every day | ORAL | 0 refills | Status: AC
Start: 2021-05-22 — End: ?
  Administered 2021-05-23 – 2021-05-28 (×6): 10 mg via ORAL

## 2021-05-22 MED ORDER — SENNOSIDES-DOCUSATE SODIUM 8.6-50 MG PO TAB
1 | Freq: Every day | ORAL | 0 refills | Status: AC | PRN
Start: 2021-05-22 — End: ?

## 2021-05-22 MED ORDER — INSULIN ASPART U-100 100 UNIT/ML SC SOLN
20 [IU] | Freq: Three times a day (TID) | SUBCUTANEOUS | 0 refills | Status: DC
Start: 2021-05-22 — End: 2021-05-22

## 2021-05-22 NOTE — Consults
[facility] Consult Note      Admission Date: 05/22/2021                                                  Chief Complaint/Reason for Consult:  Right wrist and hand pain    Assessment/Plan     Hailey Rodgers is a 70 y.o. female with a past medical history of rheumatoid arthritis and PNES presenting with 1 day of right hand and wrist pain and hand and finger swelling.  Patient's ESR is normal and CRP is mildly elevated but is downtrending compared to previous hospitalizations.  Patient's wrist and fingers are able to be actively and passively ranged but is limited secondary to pain. Of note, patient fell asleep several times during both history and exam.     -Surgical intervention: No acute surgical intervention at this time.   -WB Status -WBAT RUE, in splint. Aggressive elevation.  -Diet: okay for diet from ortho standpoint. Please keep NPO after midnight tonight pending final staffing in the morning.  -Pain control/medical management per primary  -Imaging: X-ray of the right upper extremity showing no acute fractures but does note cyst in the scaphoid and diffuse soft tissue swelling.  -Abx/tetanus: Per primary  -Last PO intake: N/A  -PPx: Per primary        Patient discussed with staff surgeon Dr. Annia Belt who directed plan of care.    During normal business hours, please contact Jake Birlingmair. At all other times, contact the orthopedic surgery resident on call for any questions or concerns.     Azzie Roup, MD  [id number]  ______________________________________________________________________      History of Present Illness: Hailey Rodgers is a 70 y.o. female with a past medical history of rheumatoid arthritis currently on prednisone and Humira, diabetes, pseudoseizures, hypothyroidism presenting because over the past day she has had increased swelling and pain in her right hand.  Patient denies any antecedent trauma to this region but does report that her Humira frequency was recently changed.  Patient also reports [place] of amb[place] which requires a walker for.  Patient does endorse some subjective chills and dry heaves at home over the past 2 days but denies any documented fevers.  Patient endorses pain all over the hand but when asked to localize points to the distalmost portion of the palmar surface of the hand.    Medical History:   Diagnosis Date   ? Age-related physical debility 10/06/2020   ? Allergic rhinitis    ? Arthritis    ? Asthma    ? Bronchitis    ? Cervical stenosis of spinal canal    ? COVID-19 virus infection 10/06/2020    06/2020   ? Disorganized thinking    ? Diverticulosis 10/06/2020   ? DM (diabetes mellitus) (HCC)    ? Dyslipidemia    ? Fatty liver disease, nonalcoholic    ? Gastroesophageal reflux disease without esophagitis 10/06/2020   ? GERD (gastroesophageal reflux disease)    ? Hypothyroidism    ? 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 Leg weakness    ? Lumbar stenosis    ? Lung nodule 10/06/2020    10/06/2020 5. ?Right upper lobe groundglass nodule measuring 5 mm which may  be  infectious or inflammatory. Multiple additional sub-5 mm nodules  bilaterally, most lymph nodes or granulomas. Follow-up CT in 12 months  recommended for further evaluation if clinically indicated.    ? Memory loss    ? Narcolepsy    ? Obesity    ? OSA (obstructive sleep apnea)    ? Other hyperlipidemia 10/06/2020   ? Peripheral neuropathy    ? Polypharmacy 01/04/2021   ? Pseudoseizures (HCC)    ? 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 Rheumatoid arthritis (HCC) 10/06/2020   ? Sleep disorder     Sleep Apnea & Narcolepsy   ? Type 2 diabetes mellitus with hyperglycemia, with long-term current use of insulin (HCC) 10/06/2020   ? Urinary incontinence    ? Urinary problem     urinary frequency   ? Vision decreased     diplopia     Surgical History:   Procedure Laterality Date   ? HX ADENOIDECTOMY  1962   ? HX TONSILLECTOMY  1962   ? TUBAL LIGATION  1977   ? RECTAL SURGERY  1979    for rectal prolapse; bladder repair   ? CHOLECYSTECTOMY  1984   ? HYSTERECTOMY  2000   ? BREAST BIOPSY Right 2012   ? COLONOSCOPY  2016   ? CERVICAL 3 TO THORACIC 7 DECOMPRESSION AND FUSION Bilateral 07/18/2015    Performed by Storm Frisk, MD at [facility] OR   ? BLEPHAROPLASTY     ? HERNIA REPAIR  1984/2007   ? SEPTOPLASTY  1986/2013   ? UPPER GASTROINTESTINAL ENDOSCOPY       Social History     Tobacco Use   ? Smoking status: Never Smoker   ? Smokeless tobacco: Never Used   Vaping Use   ? Vaping Use: Never used   Substance Use Topics   ? Alcohol use: No     Alcohol/week: 0.0 standard drinks   ? Drug use: No       Tobacco: Denies  Alcohol: Denies  Drugs: Denies    Family Hx: Reviewed and non-contributory  Allergies:  Amoxicillin-pot clavulanate, Gemfibrozil, Trazodone, Adhesive tape (rosins), Atorvastatin, Latex, Bupropion, Cymbalta [duloxetine], and Levaquin [levofloxacin]    Outpatient Medications as of 05/22/2021   Medication Sig Dispense Refill   ? acetaminophen (TYLENOL EXTRA STRENGTH) 500 mg tablet Take two tablets by mouth every 6 hours as needed. Max of 4,000 mg of acetaminophen in 24 hours.  Indications: pain 90 tablet 0   ? adalimumab (HUMIRA(CF) PEN) 40 mg/0.4 mL injection PEN kit Inject 0.4 mL under the skin every 7 days. 2 kit 5   ? aspirin 81 mg chewable tablet Chew 81 mg by mouth at bedtime daily.     ? blood-glucose sensor (DEXCOM G6 SENSOR MISC) Use  as directed.     ? calcium carbonate/vitamin D-3 (OSCAL-500+D) 1250 mg/200 unit tablet Take one tablet by mouth twice daily. Calcium Carb 1250mg  delivers 500mg  elemental Ca 180 tablet    ? celecoxib (CELEBREX) 100 mg capsule Take 100 mg by mouth twice daily.     ? dicyclomine (BENTYL) 10 mg capsule Take 10 mg by mouth four times daily.     ? DIPH/LIDO/ANTACID 1:1:1 (COMPOUND) Swish and Spit 5 mL by mouth as directed every 4 hours as needed. 300 mL 3   ? dulaglutide (TRULICITY) 1.5 mg/0.5 mL injection pen Inject 1.5 mg under the skin every Wednesday.     ? ergocalciferol (VITAMIN D-2) 50,000 unit capsule  Take 50,000 Units by mouth every 7 days.     ? fenofibrate (TRIGLIDE) 160 mg tablet Take 1 tablet by mouth at bedtime daily.     ? fexofenadine(+) (ALLEGRA) 180 mg tablet Take 180 mg by mouth daily.     ? folic acid (FOLVITE) 1 mg tablet Take two tablets by mouth daily. 90 tablet 3   ? furosemide (LASIX) 40 mg tablet Take 40 mg by mouth daily as needed.     ? insulin aspart U-100 (NOVOLOG U-100 INSULIN ASPART) 100 unit/mL injection Inject twelve Units under the skin three times daily with meals. Take 12 units with breakfast, 22 units with lunch, and 30 units with dinner.  Indications: type 2 diabetes mellitus (Patient taking differently: Inject 25 Units under the skin three times daily with meals. +sliding scale at 150>  Indications: type 2 diabetes mellitus) 15 mL 1   ? insulin degludec (TRESIBA FLEXTOUCH) 200 unit/mL (3 mL) injection PEN Inject eighty Units under the skin at bedtime daily. (Patient taking differently: Inject 100 Units under the skin at bedtime daily.) 9 mL    ? Insulin Needles (Disposable) (NANO PEN NEEDLE) 32 gauge x 5/32 ndle Use 1 Each as directed before meals and at bedtime. 100 Each 1   ? L.acid/L.casei/B.bif/B.lon/FOS (PROBIOTIC BLEND PO) Take 1 capsule by mouth daily.     ? levothyroxine (SYNTHROID) 88 mcg tablet Take 88 mcg by mouth daily.     ? lidocaine (LIDOCARE) 4 % topical patch Apply 1 patch topically to affected area daily.     ? LORazepam (ATIVAN) 0.5 mg tablet Premedication for MRI. Take 1 tablet 15-30 minutes before MRI, may take extra tablet if needed  Indications: anxious, Premedication for MRI 2 tablet 0   ? methotrexate sodium 2.5 mg tablet Take eight tablets by mouth every 7 days. 72 tablet 2   ? mirabegron (MYRBETRIQ) 50 mg ER tablet Take 50 mg by mouth daily.     ? mirtazapine 7.5 mg tablet Take one tablet by mouth at bedtime daily. Indications: insomnia, sleep maintenance 90 tablet 1   ? montelukast (SINGULAIR) 10 mg tablet Take 10 mg by mouth at bedtime daily.     ? MULTIVITAMINS WITH FLUORIDE (MULTI-VITAMIN PO) Take 1 tablet by mouth daily.     ? NYSTOP 100,000 unit/gram topical powder Apply to clean, dry skin 2-3 times daily for yeast infection     ? Omega-3 Acid Ethyl Esters (LOVAZA) 1 gram capsule Take 1 capsule by mouth daily.     ? oxybutynin XL (DITROPAN XL) 10 mg tablet Take 1 Tab by mouth daily. Do not cut/ crush/ chew 90 Tab 3   ? oxyCODONE/acetaminophen (PERCOCET) 5/325 mg tablet Take one tablet to two tablets by mouth every 6 hours as needed 30 tablet 0   ? pantoprazole DR (PROTONIX) 40 mg tablet Take 40 mg by mouth twice daily.     ? polyethylene glycol 3350 (MIRALAX) 17 g packet Take one packet by mouth twice daily as needed. 12 each    ? potassium chloride (KLOR-CON 10) 10 mEq tablet Take 2 tablets by mouth every day as needed if taking furosemide for edema.     ? pramipexole (MIRAPEX) 0.25 mg tablet Take 0.75 mg by mouth three times daily.     ? predniSONE (DELTASONE) 2.5 mg tablet Take 4 tablets by mouth daily for 2 weeks, then decrease to 3 tablets daily for 2 weeks, then decrease to 2 tablet daily for 2 weeks, then decrease to  1 tablet daily. 150 tablet 1   ? rosuvastatin (CRESTOR) 20 mg tablet Take 1 tablet by mouth daily.     ? senna/docusate (SENOKOT-S) 8.6/50 mg tablet Take one tablet by mouth twice daily. 90 tablet    ? venlafaxine XR (EFFEXOR XR) 150 mg capsule Take one capsule by mouth daily with breakfast. Take with food.  Indications: repeated episodes of anxiety, major depressive disorder, panic disorder 90 capsule 1   ? vitamin E 400 unit cap capsule Take 1 capsule by mouth at bedtime daily.     ? Zinc 50 mg tab Take  by mouth.           Review of Systems:    Positive: Right hand swelling and  pain  Negative: Fevers, numbness, paresthesias right upper extremity    Otherwise, 10 point ROS was negative except the pertinent information included in the HPI    Vital Signs:  Last Filed in 24 hours   BP: 151/70 (05/31 1700)  Temp: 36.2 ?C (97.2 ?F) (05/31 1030)  Pulse: 85 (05/31 1700)  Respirations: 25 PER MINUTE (05/31 1700)  SpO2: 98 % (05/31 1700)  O2 Device: None (Room air) (05/31 1030)  SpO2 Pulse: 82 (05/31 1700)  Height: 160 cm (5' 3) (05/31 1030)     Physical Exam:    Constitutional: A&O, NAD  HEENT: EOMI, normocephalic  Respiratory:  Unlabored respirations  Cardiovascular: Regular rate  Skin:  No open fractures, rashes, abrasions, lacerations.  Musculoskeletal:     RUE: Patient's right hand and digits are diffusely swollen, she is able to minimally flex and extend all digits at the MCP PIP and DIP joints however it is significantly limited secondary to patient reported pain.  Patient is able to perform minimal active flexion and extension of the wrist.  With distraction patient's wrist is able to passively be flexed and extended approximately 20 degrees before she begins to endorse some pain.  Patient is noted to be using her right upper extremity to help reposition pillows and do other tasks around the bed whenever it is not being actively evaluated.  Able to flex/extend the elbow; AIN/PIN/ulnar motor function intact, SILT in radial/median/ulnar nerve distributions; 2+ radial pulse, hand well perfused, soft compartments, warm extremity, <2s cap refill.         Lab/Radiology/Other Diagnostic Tests:         Radiology: Reviewed    CBC w/Diff   Lab Results   Component Value Date/Time    WBC 9.3 05/22/2021 12:36 PM    HGB 10.9 (L) 05/22/2021 12:36 PM    HCT 33.8 (L) 05/22/2021 12:36 PM    PLTCT 254 05/22/2021 12:36 PM           Basic Metabolic Profile   Lab Results   Component Value Date/Time    NA 132 (L) 05/22/2021 12:36 PM    K 3.8 05/22/2021 12:36 PM    CL 97 (L) 05/22/2021 12:36 PM    CO2 26 05/22/2021 12:36 PM    GAP 9 05/22/2021 12:36 PM    BUN 17 05/22/2021 12:36 PM    CR 0.54 05/22/2021 12:36 PM GLU 280 (H) 05/22/2021 12:36 PM    GLU 90 11/24/2004 06:10 PM        Coagulation Studies   Lab Results   Component Value Date/Time    PTT 27.2 07/14/2015 04:15 PM    INR 1.1 07/14/2015 04:15 PM

## 2021-05-22 NOTE — Telephone Encounter
I am so sorry to hear this. I hope she feels better soon. If her doctors think she needs to be admitted to the hospital for workup of this pain, there is a rheumatology hospital service that can see her. If she gets discharged from the ER, let me know because we will need to do something different for her RA.     Craige Cotta, MD  Rheumatology Fellow, PGY-5  Pager Number: [phone]

## 2021-05-22 NOTE — ED Notes
70 y/o female presents to ED30 w/ CC arm swelling. The pt states that her R hand started swelling yesterday around 2 pm and has continued to worsen. She states that swelling and pain has spread to her forearm as well. She rates the pain 10/10 and describes it as sharp and burning. She states that she took a Percocet last night at 2 am and states it is making her sleepy. She reports hx of rheumatoid arthritis and states she is compliant with her medication regimen. The pt presents with labored breathing and states that sometimes she feels short of breath. She denies any other complaints today.     The pt is A&Ox4. Breathing is even and non-labored on RA. Skin is warm, dry, and appropriate for ethnicity. The pt is resting with cart in lowest locked position and call light in reach.    Medical History:   Diagnosis Date   ? Age-related physical debility 10/06/2020   ? Allergic rhinitis    ? Arthritis    ? Asthma    ? Bronchitis    ? Cervical stenosis of spinal canal    ? COVID-19 virus infection 10/06/2020    06/2020   ? Disorganized thinking    ? Diverticulosis 10/06/2020   ? DM (diabetes mellitus) (HCC)    ? Dyslipidemia    ? Fatty liver disease, nonalcoholic    ? Gastroesophageal reflux disease without esophagitis 10/06/2020   ? GERD (gastroesophageal reflux disease)    ? Hypothyroidism    ? 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 Leg weakness    ? Lumbar stenosis    ? Lung nodule 10/06/2020    10/06/2020 5. ?Right upper lobe groundglass nodule measuring 5 mm which may be  infectious or inflammatory. Multiple additional sub-5 mm nodules  bilaterally, most lymph nodes or granulomas. Follow-up CT in 12 months  recommended for further evaluation if clinically indicated.    ? Memory loss    ? Narcolepsy    ? Obesity    ? OSA (obstructive sleep apnea)    ? Other hyperlipidemia 10/06/2020   ? Peripheral neuropathy    ? Polypharmacy 01/04/2021   ? Pseudoseizures (HCC)    ? 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 Rheumatoid arthritis (HCC) 10/06/2020   ? Sleep disorder     Sleep Apnea & Narcolepsy   ? Type 2 diabetes mellitus with hyperglycemia, with long-term current use of insulin (HCC) 10/06/2020   ? Urinary incontinence    ? Urinary problem     urinary frequency   ? Vision decreased     diplopia     All belongings remain with pt.

## 2021-05-22 NOTE — H&P (View-Only)
Admission History and Physical Examination      Name:  Hailey Rodgers                                             MRN:  [id number]   Admission Date:  05/22/2021                     Assessment/Plan:    Principal Problem:    Joint pain      70 year old female with history of rheumatoid arthritis currently on Humira, methotrexate and prednisone, T2DM, hypothyroidism, pseudoseizure, restless leg syndrome, depression, sleep apnea and obesity presented to ED with complaints of worsening right forearm/arm swelling and wrist pain rating down to her fingers.    Suspected cellulitis  Rheumatoid arthritis flare up  Right forearm swelling  Severe pain right upper extremity  -Ultrasound showing no DVT but right forearm edema  -Orthopedics team consulted with worsening right wrist pain suddenly to rule out any septic arthritis  -Check RA and anti-CCP levels  -Blood cultures ordered and pending  -Continue vancomycin started in ED  -Resume prednisone at 5 mg for now.  We will switch to IV steroids after ruling out septic arthritis.  Consult rheumatology team for further recommendations    T2DM, uncontrolled  -Reports taking 25 units NovoLog with meals plus sliding scale and Tresiba 100 units nightly  -Resume NovoLog 20 units with MDCF and Lantus at 60 units as daughter reports history of hypoglycemic episodes in the hospital with diabetic diet    Drowsiness  History of hallucinations  -Patient was evaluated by neurology team outpatient with work-up including MRI head which was normal  -Delirium precautions  -Nightly melatonin    Hypothyroidism  Resume PTA levothyroxine    Restless leg syndrome   -Resume Requip    Sleep apnea  -CPAP nightly ordered    Bilateral lower extremity edema   -Daughter reports chronic leg swelling for which she takes Lasix 20 mg daily at home  -Last echo 11/21 normal with no heart failure  -Continue Lasix daily with potassium  -Likely related to steroid use    FEN  -IVF None  -Replace K & Mg to keep them >4 & >2 respectively   -Diet: Cardiac/CC    DVT ppx : Heparin sc    Full code     __________________________________________________________________________________  Primary Care Physician: Lona Kettle  Verified    Chief Complaint:  Pain     History of Present Illness: Hailey Rodgers is a 70 y.o. female with history of rheumatoid arthritis currently on Humira, methotrexate and prednisone, T2DM, hypothyroidism, pseudoseizure, restless leg syndrome, depression, sleep apnea and obesity presented to ED with complaints of worsening right forearm/arm swelling and wrist pain rating down to her fingers.    Patient was seen in ED with her daughter at the bedside who is also a Engineer, civil (consulting).  Most of the history was obtained from her daughter who reports patient has been having worsening right upper extremity pain with swelling since yesterday. Patient and her daughter denied having any high-grade fevers/chills at home.  She brought her to ED with acute worsening of her pain with resultant lack of sleep yesterday night.  She is having hard time flexing her wrist, elbow which makes pain worse.     Ongoing issues with her rheumatoid arthritis including uncontrolled pain and swelling for  which to follow with rheumatology clinic outpatient.  Patient currently on Humira with increased frequency recently, methotrexate and tapering steroids at this time.  She also takes Percocet as needed.    Medical History:   Diagnosis Date   ? Age-related physical debility 10/06/2020   ? Allergic rhinitis    ? Arthritis    ? Asthma    ? Bronchitis    ? Cervical stenosis of spinal canal    ? COVID-19 virus infection 10/06/2020    06/2020   ? Disorganized thinking    ? Diverticulosis 10/06/2020   ? DM (diabetes mellitus) (HCC)    ? Dyslipidemia    ? Fatty liver disease, nonalcoholic    ? Gastroesophageal reflux disease without esophagitis 10/06/2020   ? GERD (gastroesophageal reflux disease)    ? Hypothyroidism    ? 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 Leg weakness    ? Lumbar stenosis    ? Lung nodule 10/06/2020    10/06/2020 5. ?Right upper lobe groundglass nodule measuring 5 mm which may be  infectious or inflammatory. Multiple additional sub-5 mm nodules  bilaterally, most lymph nodes or granulomas. Follow-up CT in 12 months  recommended for further evaluation if clinically indicated.    ? Memory loss    ? Narcolepsy    ? Obesity    ? OSA (obstructive sleep apnea)    ? Other hyperlipidemia 10/06/2020   ? Peripheral neuropathy    ? Polypharmacy 01/04/2021   ? Pseudoseizures (HCC)    ? 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 Rheumatoid arthritis (HCC) 10/06/2020   ? Sleep disorder     Sleep Apnea & Narcolepsy   ? Type 2 diabetes mellitus with hyperglycemia, with long-term current use of insulin (HCC) 10/06/2020   ? Urinary incontinence    ? Urinary problem     urinary frequency   ? Vision decreased     diplopia     Surgical History:   Procedure Laterality Date   ? HX ADENOIDECTOMY  1962   ? HX TONSILLECTOMY  1962   ? TUBAL LIGATION  1977   ? RECTAL SURGERY  1979    for rectal prolapse; bladder repair   ? CHOLECYSTECTOMY  1984   ? HYSTERECTOMY  2000   ? BREAST BIOPSY Right 2012   ? COLONOSCOPY  2016   ? CERVICAL 3 TO THORACIC 7 DECOMPRESSION AND FUSION Bilateral 07/18/2015    Performed by Storm Frisk, MD at [facility] OR   ? BLEPHAROPLASTY     ? HERNIA REPAIR  1984/2007   ? SEPTOPLASTY  1986/2013   ? UPPER GASTROINTESTINAL ENDOSCOPY       Family history reviewed; non-contributory  Social History     Socioeconomic History   ? Marital status: Married     Spouse name: Not on file   ? Number of children: 2   ? Years of education: Not on file   ? Highest education level: Not on file   Occupational History   ? Occupation: DISABLE    Tobacco Use   ? Smoking status: Never Smoker   ? Smokeless tobacco: Never Used   Vaping Use   ? Vaping Use: Never used Substance and Sexual Activity   ? Alcohol use: No     Alcohol/week: 0.0 standard drinks   ? Drug use: No   ? Sexual activity: Not on file   Other Topics Concern   ? Not on  file   Social History Narrative   ? Not on file     Social Determinants of Health     Financial Resource Strain: Not on file   Food Insecurity: Not on file   Transportation Needs: Not on file   Stress: Not on file   Social Connections: Not on file   Housing Stability: Not on file      Immunizations (includes history and patient reported):   Immunization History   Administered Date(s) Administered   ? COVID-19 (MODERNA), mRNA vacc, 100 mcg/0.5 mL (PF) 10/07/2020   ? Flu Vaccine =>65 YO High-Dose Quadrivalent (PF) 10/25/2020           Allergies:  Amoxicillin-pot clavulanate, Gemfibrozil, Trazodone, Adhesive tape (rosins), Atorvastatin, Latex, Bupropion, Cymbalta [duloxetine], and Levaquin [levofloxacin]    Medications:  Current Facility-Administered Medications   Medication   ? vancomycin (VANCOCIN) 2,500 mg in sodium chloride 0.9% (NS) 550 mL IVPB     Current Outpatient Medications   Medication Sig   ? acetaminophen (TYLENOL EXTRA STRENGTH) 500 mg tablet Take two tablets by mouth every 6 hours as needed. Max of 4,000 mg of acetaminophen in 24 hours.  Indications: pain   ? adalimumab (HUMIRA(CF) PEN) 40 mg/0.4 mL injection PEN kit Inject 0.4 mL under the skin every 7 days.   ? aspirin 81 mg chewable tablet Chew 81 mg by mouth at bedtime daily.   ? blood-glucose sensor (DEXCOM G6 SENSOR MISC) Use  as directed.   ? calcium carbonate/vitamin D-3 (OSCAL-500+D) 1250 mg/200 unit tablet Take one tablet by mouth twice daily. Calcium Carb 1250mg  delivers 500mg  elemental Ca   ? celecoxib (CELEBREX) 100 mg capsule Take 100 mg by mouth twice daily.   ? dicyclomine (BENTYL) 10 mg capsule Take 10 mg by mouth four times daily.   ? DIPH/LIDO/ANTACID 1:1:1 (COMPOUND) Swish and Spit 5 mL by mouth as directed every 4 hours as needed.   ? dulaglutide (TRULICITY) 1.5 mg/0.5 mL injection pen Inject 1.5 mg under the skin every Wednesday.   ? ergocalciferol (VITAMIN D-2) 50,000 unit capsule Take 50,000 Units by mouth every 7 days.   ? fenofibrate (TRIGLIDE) 160 mg tablet Take 1 tablet by mouth at bedtime daily.   ? fexofenadine(+) (ALLEGRA) 180 mg tablet Take 180 mg by mouth daily.   ? folic acid (FOLVITE) 1 mg tablet Take two tablets by mouth daily.   ? furosemide (LASIX) 40 mg tablet Take 40 mg by mouth daily as needed.   ? insulin aspart U-100 (NOVOLOG U-100 INSULIN ASPART) 100 unit/mL injection Inject twelve Units under the skin three times daily with meals. Take 12 units with breakfast, 22 units with lunch, and 30 units with dinner.  Indications: type 2 diabetes mellitus (Patient taking differently: Inject 25 Units under the skin three times daily with meals. +sliding scale at 150>  Indications: type 2 diabetes mellitus)   ? insulin degludec (TRESIBA FLEXTOUCH) 200 unit/mL (3 mL) injection PEN Inject eighty Units under the skin at bedtime daily. (Patient taking differently: Inject 100 Units under the skin at bedtime daily.)   ? Insulin Needles (Disposable) (NANO PEN NEEDLE) 32 gauge x 5/32 ndle Use 1 Each as directed before meals and at bedtime.   ? L.acid/L.casei/B.bif/B.lon/FOS (PROBIOTIC BLEND PO) Take 1 capsule by mouth daily.   ? levothyroxine (SYNTHROID) 88 mcg tablet Take 88 mcg by mouth daily.   ? lidocaine (LIDOCARE) 4 % topical patch Apply 1 patch topically to affected area daily.   ? LORazepam (ATIVAN)  0.5 mg tablet Premedication for MRI. Take 1 tablet 15-30 minutes before MRI, may take extra tablet if needed  Indications: anxious, Premedication for MRI   ? methotrexate sodium 2.5 mg tablet Take eight tablets by mouth every 7 days.   ? mirabegron (MYRBETRIQ) 50 mg ER tablet Take 50 mg by mouth daily.   ? mirtazapine 7.5 mg tablet Take one tablet by mouth at bedtime daily. Indications: insomnia, sleep maintenance   ? montelukast (SINGULAIR) 10 mg tablet Take 10 mg by mouth at bedtime daily.   ? MULTIVITAMINS WITH FLUORIDE (MULTI-VITAMIN PO) Take 1 tablet by mouth daily.   ? NYSTOP 100,000 unit/gram topical powder Apply to clean, dry skin 2-3 times daily for yeast infection   ? Omega-3 Acid Ethyl Esters (LOVAZA) 1 gram capsule Take 1 capsule by mouth daily.   ? oxybutynin XL (DITROPAN XL) 10 mg tablet Take 1 Tab by mouth daily. Do not cut/ crush/ chew   ? oxyCODONE/acetaminophen (PERCOCET) 5/325 mg tablet Take one tablet to two tablets by mouth every 6 hours as needed   ? pantoprazole DR (PROTONIX) 40 mg tablet Take 40 mg by mouth twice daily.   ? polyethylene glycol 3350 (MIRALAX) 17 g packet Take one packet by mouth twice daily as needed.   ? potassium chloride (KLOR-CON 10) 10 mEq tablet Take 2 tablets by mouth every day as needed if taking furosemide for edema.   ? pramipexole (MIRAPEX) 0.25 mg tablet Take 0.75 mg by mouth three times daily.   ? predniSONE (DELTASONE) 2.5 mg tablet Take 4 tablets by mouth daily for 2 weeks, then decrease to 3 tablets daily for 2 weeks, then decrease to 2 tablet daily for 2 weeks, then decrease to 1 tablet daily.   ? rosuvastatin (CRESTOR) 20 mg tablet Take 1 tablet by mouth daily.   ? senna/docusate (SENOKOT-S) 8.6/50 mg tablet Take one tablet by mouth twice daily.   ? venlafaxine XR (EFFEXOR XR) 150 mg capsule Take one capsule by mouth daily with breakfast. Take with food.  Indications: repeated episodes of anxiety, major depressive disorder, panic disorder   ? vitamin E 400 unit cap capsule Take 1 capsule by mouth at bedtime daily.   ? Zinc 50 mg tab Take  by mouth.     Review of Systems:  A 14 point review of systems was negative except for: Cardiovascular: positive for lower extremity edema  Musculoskeletal: positive for myalgias, arthralgias and stiff joints  Behvioral/Psych: positive for behavior problems, depression, obesity and sleep disturbance  Endocrine: positive for Diabetes mellitus Type II  on insulin    Physical Exam:  Vital Signs: Last Filed In 24 Hours Vital Signs: 24 Hour Range   BP: 158/91 (05/31 1430)  Temp: 36.2 ?C (97.2 ?F) (05/31 1030)  Pulse: 83 (05/31 1430)  Respirations: 20 PER MINUTE (05/31 1430)  SpO2: 95 % (05/31 1430)  O2 Device: None (Room air) (05/31 1030)  SpO2 Pulse: 82 (05/31 1430)  Height: 160 cm (5' 3) (05/31 1030) BP: (143-181)/(73-94)   Temp:  [36.2 ?C (97.2 ?F)]   Pulse:  [83-96]   Respirations:  [17 PER MINUTE-23 PER MINUTE]   SpO2:  [94 %-100 %]   O2 Device: None (Room air)   Intensity Pain Scale (Self Report): 10 (05/22/21 1115)      Physical Exam  Vitals and nursing note reviewed.   Constitutional:       General: She is not in acute distress.     Appearance: She is well-developed.  She is obese. She is not diaphoretic.   HENT:      Head: Normocephalic and atraumatic.      Mouth/Throat:      Pharynx: No oropharyngeal exudate.   Eyes:      General: No scleral icterus.     Pupils: Pupils are equal, round, and reactive to light.   Neck:      Vascular: No JVD.   Cardiovascular:      Rate and Rhythm: Normal rate and regular rhythm.      Pulses: Normal pulses.   Pulmonary:      Effort: Pulmonary effort is normal.      Breath sounds: No wheezing or rales.   Abdominal:      General: Bowel sounds are normal.      Palpations: Abdomen is soft.   Musculoskeletal:         General: Swelling (R>L forarm swelling with TTP ) present. No tenderness. Normal range of motion.      Cervical back: Normal range of motion and neck supple.      Right lower leg: Edema present.      Left lower leg: Edema present.   Skin:     General: Skin is warm and dry.      Capillary Refill: Capillary refill takes less than 2 seconds.      Findings: No rash.   Neurological:      General: No focal deficit present.      Mental Status: She is alert and oriented to person, place, and time.      Motor: No abnormal muscle tone.      Deep Tendon Reflexes: Reflexes normal.      Comments: Drowsy but alert and oriented    Psychiatric:         Behavior: Behavior normal.           Lab/Radiology/Other Diagnostic Tests:  Pertinent labs reviewed  Glucose: (!) 280 (05/22/21 1236)  Pertinent radiology reviewed.    Jeanella Craze, MD  Pager [phone]

## 2021-05-23 ENCOUNTER — Encounter: Admit: 2021-05-23 | Discharge: 2021-05-23 | Payer: MEDICARE

## 2021-05-23 ENCOUNTER — Inpatient Hospital Stay: Admit: 2021-05-23 | Discharge: 2021-05-23 | Payer: MEDICARE

## 2021-05-23 MED ADMIN — PERFLUTREN LIPID MICROSPHERES 1.1 MG/ML IV SUSP [79178]: 2 mL | INTRAVENOUS | @ 21:00:00 | Stop: 2021-05-23 | NDC 11994001116

## 2021-05-23 MED ADMIN — PREDNISONE 5 MG PO TAB [6497]: 5 mg | ORAL | @ 23:00:00 | NDC 00054872425

## 2021-05-23 NOTE — Progress Notes
RT Adult Assessment Note    NAME:Simaya GUNDA MAQUEDA             MRN: [id number]             DOB:August 15, 1951          AGE: 70 y.o.  ADMISSION DATE: 05/22/2021             DAYS ADMITTED: LOS: 0 days    RT Treatment Plan:       Protocol Plan: Procedures  Oxygen/Humidity: O2 to keep SpO2 > 92%, if on room air for > 24 hours and no other RT modalities are required, then D/C protocol  CPAP/BiPAP: BiPAP  SpO2: Continuous (Document SpO2 result Qshift)    Additional Comments:  Impressions of the patient: Pt resting non labored  Intervention(s)/outcome(s): RT eval complete  Patient education that was completed:   Recommendations to the care team:     Vital Signs:  Pulse: 80  RR: 20 PER MINUTE  SpO2: 99 %  O2 Device: Bled-in  Liter Flow: 2 Lpm  O2%:    Breath Sounds: Clear (Implies normal)  Respiratory Effort: Non-Labored

## 2021-05-24 MED ADMIN — INSULIN ASPART 100 UNIT/ML SC FLEXPEN [87504]: 5 [IU] | SUBCUTANEOUS | @ 23:00:00 | NDC 00169633910

## 2021-05-24 MED ADMIN — PREDNISONE 5 MG PO TAB [6497]: 5 mg | ORAL | @ 14:00:00 | NDC 00054872425

## 2021-05-24 MED ADMIN — FOLIC ACID 1 MG PO TAB [3233]: 6 mg | ORAL | @ 15:00:00 | Stop: 2021-05-24 | NDC 62584089711

## 2021-05-24 MED ADMIN — FERROUS SULFATE 325 MG (65 MG IRON) PO TAB [3074]: 325 mg | ORAL | @ 17:00:00 | NDC 00904759161

## 2021-05-25 ENCOUNTER — Inpatient Hospital Stay: Admit: 2021-05-25 | Discharge: 2021-05-25 | Payer: MEDICARE

## 2021-05-25 MED ADMIN — PREDNISONE 5 MG PO TAB [6497]: 15 mg | ORAL | @ 20:00:00 | Stop: 2021-05-25 | NDC 00054872425

## 2021-05-25 MED ADMIN — FERROUS SULFATE 325 MG (65 MG IRON) PO TAB [3074]: 325 mg | ORAL | @ 14:00:00 | NDC 00904759161

## 2021-05-25 MED ADMIN — FOLIC ACID 1 MG PO TAB [3233]: 4 mg | ORAL | @ 14:00:00 | NDC 62584089711

## 2021-05-25 MED ADMIN — NORTRIPTYLINE 25 MG PO CAP [5675]: 25 mg | ORAL | @ 02:00:00 | NDC 60687029311

## 2021-05-25 MED ADMIN — PREDNISONE 5 MG PO TAB [6497]: 5 mg | ORAL | @ 14:00:00 | Stop: 2021-05-25 | NDC 00054872425

## 2021-05-26 MED ADMIN — MAGNESIUM HYDROXIDE 2,400 MG/10 ML PO SUSP [136089]: 10 mL | ORAL | @ 02:00:00 | Stop: 2021-05-26 | NDC 00121094010

## 2021-05-26 MED ADMIN — FERROUS SULFATE 325 MG (65 MG IRON) PO TAB [3074]: 325 mg | ORAL | @ 13:00:00 | NDC 00904759161

## 2021-05-26 MED ADMIN — NORTRIPTYLINE 25 MG PO CAP [5675]: 25 mg | ORAL | @ 01:00:00 | NDC 60687029311

## 2021-05-26 MED ADMIN — PREDNISONE 5 MG PO TAB [6497]: 20 mg | ORAL | @ 13:00:00 | NDC 00054872425

## 2021-05-26 MED ADMIN — FOLIC ACID 1 MG PO TAB [3233]: 4 mg | ORAL | @ 14:00:00 | NDC 62584089711

## 2021-05-26 MED ADMIN — METHOTREXATE SODIUM 2.5 MG PO TAB [4973]: 20 mg | ORAL | @ 20:00:00 | NDC 51079067001

## 2021-05-27 MED ADMIN — PREDNISONE 20 MG PO TAB [6496]: 20 mg | ORAL | @ 15:00:00 | NDC 00054001820

## 2021-05-27 MED ADMIN — FOLIC ACID 1 MG PO TAB [3233]: 4 mg | ORAL | @ 15:00:00 | NDC 62584089711

## 2021-05-27 MED ADMIN — ERGOCALCIFEROL (VITAMIN D2) 1,250 MCG (50,000 UNIT) PO CAP [81876]: 50000 [IU] | ORAL | @ 15:00:00 | NDC 60687050011

## 2021-05-27 MED ADMIN — INSULIN GLARGINE 100 UNIT/ML (3 ML) SC INJ PEN [163596]: 70 [IU] | SUBCUTANEOUS | @ 03:00:00 | NDC 00088221905

## 2021-05-27 MED ADMIN — FERROUS SULFATE 325 MG (65 MG IRON) PO TAB [3074]: 325 mg | ORAL | @ 15:00:00 | NDC 00904759161

## 2021-05-27 MED ADMIN — NORTRIPTYLINE 25 MG PO CAP [5675]: 25 mg | ORAL | @ 03:00:00 | NDC 60687029311

## 2021-05-28 ENCOUNTER — Encounter: Admit: 2021-05-28 | Discharge: 2021-05-28 | Payer: MEDICARE

## 2021-05-28 MED ADMIN — NORTRIPTYLINE 25 MG PO CAP [5675]: 25 mg | ORAL | @ 02:00:00 | NDC 60687029311

## 2021-05-28 MED ADMIN — PREDNISONE 20 MG PO TAB [6496]: 20 mg | ORAL | @ 14:00:00 | Stop: 2021-05-28 | NDC 00054001820

## 2021-05-28 MED ADMIN — FOLIC ACID 1 MG PO TAB [3233]: 4 mg | ORAL | @ 14:00:00 | Stop: 2021-05-28 | NDC 62584089711

## 2021-05-28 MED ADMIN — FERROUS SULFATE 325 MG (65 MG IRON) PO TAB [3074]: 325 mg | ORAL | @ 14:00:00 | Stop: 2021-05-28 | NDC 00904759161

## 2021-05-28 MED FILL — FOLIC ACID 1 MG PO TAB: 1 mg | ORAL | 30 days supply | Qty: 120 | Fill #1 | Status: CP

## 2021-05-29 ENCOUNTER — Encounter: Admit: 2021-05-29 | Discharge: 2021-05-29 | Payer: MEDICARE

## 2021-05-29 NOTE — Progress Notes
Pharmacy Medication Initial Assessment    Indication/Regimen  The regimen of ENBREL SURECLICK 50 MG/ML (1 ML) SC PNIJ indefinitely is appropriate for Hailey Rodgers who has Rheumatoid arthritis (HCC).    Renal dose adjustments are not required. Hepatic dose adjustments are not required. Dose titration is not required.    The patient has the ability to self-administer the medication(s).    Baseline Characteristics  Current DMARDs: Enbrel + MTX   Previous DMARDs: Humira   Additional considerations: recent admit to [facility] from 5/31-6/6 for joint pain   Baseline labs were completed and evaluated.    Therapeutic Goals and Monitoring  The goal of therapy is to improve or control symptoms and disease activity.    Past Medical History  Medical History:   Diagnosis Date   ? Age-related physical debility 10/06/2020   ? Allergic rhinitis    ? Arthritis    ? Asthma    ? Bronchitis    ? Cervical stenosis of spinal canal    ? COVID-19 virus infection 10/06/2020    06/2020   ? Disorganized thinking    ? Diverticulosis 10/06/2020   ? DM (diabetes mellitus) (HCC)    ? Dyslipidemia    ? Fatty liver disease, nonalcoholic    ? Gastroesophageal reflux disease without esophagitis 10/06/2020   ? GERD (gastroesophageal reflux disease)    ? Hypothyroidism    ? 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 Leg weakness    ? Lumbar stenosis    ? Lung nodule 10/06/2020    10/06/2020 5. ?Right upper lobe groundglass nodule measuring 5 mm which may be  infectious or inflammatory. Multiple additional sub-5 mm nodules  bilaterally, most lymph nodes or granulomas. Follow-up CT in 12 months  recommended for further evaluation if clinically indicated.    ? Memory loss    ? Narcolepsy    ? Obesity    ? OSA (obstructive sleep apnea)    ? Other hyperlipidemia 10/06/2020   ? Peripheral neuropathy    ? Polypharmacy 01/04/2021   ? Pseudoseizures (HCC)    ? 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 Rheumatoid arthritis (HCC) 10/06/2020   ? Sleep disorder     Sleep Apnea & Narcolepsy   ? Type 2 diabetes mellitus with hyperglycemia, with long-term current use of insulin (HCC) 10/06/2020   ? Urinary incontinence    ? Urinary problem     urinary frequency   ? Vision decreased     diplopia     Labs and Diagnostic Tests  TB Screening  T Spot TB   Date Value Ref Range Status   11/10/2020   Final    Negative  Reference range: Normal Value: Negative  A negative test result does not exclude the possibility of exposure  to or infection with Mycobacterium tuberculosis (M. tuberculosis).   Patients with recent exposure to TB infected individuals exhibiting a  negative T-SPOT.TB result should be considered for retesting within 6  weeks or if other relevant clinical symptoms indicate.  Results from  T-SPOT.TB testing must be used in conjunction with each individual's  epidemiological history, current medical status, and results of other  diagnostic evaluations.[id number] T-SPOT.TB test is qualitative  and results are reported as positive, borderline or negative, given  that the test controls perform as expected. In line with the Centers  for Disease Control and Prevention's 2010 recommendation to report  quantitative measurements alongside the qualitative result, the  laboratory provides spot counts for informational purposes only.  The  T-SPOT.TB test should not be interpreted as a quantitative test.         Hepatitis B Screening  HBsAg   Date/Time Value Ref Range Status   10/07/2020 04:07 AM NONREACTIVE NR-NONREACTIVE Final     Comment:     HBs antigen not detected.     Anti HBc Total   Date Value Ref Range Status   10/07/2020 NONREACTIVE NR-NONREACTIVE Final     Comment:     Antibodies to HBV core antigen (anti-HBc) were not detected.     Anti HBs   Date/Time Value Ref Range Status   10/07/2020 04:07 AM NEG NEG-NEG Final     Comment:     Individual is considered to be non-immune to HBV infection.        Other Pertinent Labs   NA     Allergies  Allergies   Allergen Reactions   ? Amoxicillin-Pot Clavulanate HIVES   ? Gemfibrozil HIVES   ? Trazodone HALLUCINATIONS   ? Adhesive Tape (Rosins) RASH   ? Atorvastatin SEE COMMENTS     States it caused her to have a fatty liver.   Reaction: LFT elevation; Comment: LIPITOR  Reaction: LFT elevation; Comment: LIPITOR  Reaction: LFT elevation; Comment: LIPITOR  Reaction: LFT elevation; Comment: LIPITOR     ? Latex RASH   ? Bupropion SEE COMMENTS     Allergy recorded in SMS: WELLBUTRIN~Reactions: ABNORMAL SEIZUR   ? Cymbalta [Duloxetine] UNKNOWN   ? Levaquin [Levofloxacin] DIARRHEA        Immunizations  Vaccine history was reviewed with the patient. Education will be provided on the importance of completing vaccines.    Immunization History   Administered Date(s) Administered   ? COVID-19 (MODERNA), mRNA vacc, 100 mcg/0.5 mL (PF) 10/07/2020   ? Flu Vaccine =>65 YO High-Dose Quadrivalent (PF) 10/25/2020       Home Medications    Medication Sig   acetaminophen (TYLENOL EXTRA STRENGTH) 500 mg tablet Take two tablets by mouth every 6 hours as needed. Max of 4,000 mg of acetaminophen in 24 hours.  Indications: pain   adalimumab (HUMIRA(CF) PEN) 40 mg/0.4 mL injection PEN kit Inject 0.4 mL under the skin every 7 days.  Patient taking differently: Inject 40 mg under the skin every Thursday.   aspirin 81 mg chewable tablet Chew 81 mg by mouth at bedtime daily.   blood-glucose sensor (DEXCOM G6 SENSOR MISC) Use  as directed.   calcium carbonate/vitamin D-3 (OSCAL-500+D) 1250 mg/200 unit tablet Take one tablet by mouth twice daily. Calcium Carb 1250mg  delivers 500mg  elemental Ca   celecoxib (CELEBREX) 100 mg capsule Take 100 mg by mouth twice daily.   DIPH/LIDO/ANTACID 1:1:1 (COMPOUND) Swish and Spit 5 mL by mouth as directed every 4 hours as needed.   dulaglutide (TRULICITY) 1.5 mg/0.5 mL injection pen Inject 1.5 mg under the skin every Wednesday.   ergocalciferol (VITAMIN D-2) 50,000 unit capsule Take 50,000 Units by mouth every 7 days.   etanercept (ENBREL SURECLICK) 50 mg/mL (1 mL) injection pen Inject 1 mL under the skin every 7 days.   ferrous sulfate (FEOSOL) 325 mg (65 mg iron) tablet Take 325 mg by mouth twice daily. Take on an empty stomach at least 1 hour before or 2 hours after food.   fexofenadine(+) (ALLEGRA) 180 mg tablet Take 180 mg by mouth daily.   fluocinolone acetonide oil (DERMOTIC OIL) 0.01 % otic drops INSTILL 3 DROPS INTO NOSE  DAILY AS DIRECTED   folic acid (FOLVITE) 1 mg tablet Take four tablets by mouth daily.   furosemide (LASIX) 40 mg tablet Take 40 mg by mouth daily as needed.   insulin aspart U-100 (NOVOLOG U-100 INSULIN ASPART) 100 unit/mL injection Inject twelve Units under the skin three times daily with meals. Take 12 units with breakfast, 22 units with lunch, and 30 units with dinner.  Indications: type 2 diabetes mellitus  Patient taking differently: Inject 25 Units under the skin three times daily before meals. Plus sliding scale: if BG 150-159 = 1 unit, 160-169 = 2 units  Indications: type 2 diabetes mellitus   insulin degludec (TRESIBA FLEXTOUCH) 200 unit/mL (3 mL) injection PEN Inject eighty Units under the skin at bedtime daily.  Patient taking differently: Inject 100 Units under the skin at bedtime daily.   Insulin Needles (Disposable) (NANO PEN NEEDLE) 32 gauge x 5/32 ndle Use 1 Each as directed before meals and at bedtime.   lactobacillus rhamnosus GG (CULTURELLE) 15 billion cell capsule Take 1 capsule by mouth as directed daily.   levothyroxine (SYNTHROID) 88 mcg tablet Take 88 mcg by mouth daily.   lidocaine (ASPERCREME PATCH) 4 % topical patch Apply 1 patch topically to affected area daily.   methotrexate sodium 2.5 mg tablet Take eight tablets by mouth every 7 days.  Patient taking differently: Take 20 mg by mouth every Friday.   mirabegron (MYRBETRIQ) 50 mg ER tablet Take 50 mg by mouth daily.   nortriptyline (PAMELOR) 25 mg capsule Take 25 mg by mouth at bedtime daily.   NYSTOP 100,000 unit/gram topical powder Apply to clean, dry skin 2-3 times daily for yeast infection   oxyCODONE/acetaminophen (PERCOCET) 5/325 mg tablet Take one tablet to two tablets by mouth every 6 hours as needed   pantoprazole DR (PROTONIX) 40 mg tablet Take 40 mg by mouth twice daily.   pioglitazone (ACTOS) 15 mg tablet Take 15 mg by mouth daily.   polyethylene glycol 3350 (MIRALAX) 17 g packet Take one packet by mouth twice daily as needed.   potassium chloride (KLOR-CON 10) 10 mEq tablet Take 2 tablets by mouth every day as needed if taking furosemide for edema.   pramipexole (MIRAPEX) 0.75 mg tablet Take 0.75 mg by mouth three times daily.   predniSONE (DELTASONE) 2.5 mg tablet Take 8 tablets (20mg ) by mouth daily for 2 more days, then decrease by 1 tablet (2.5mg ) every three days until you are done with med.   rosuvastatin (CRESTOR) 20 mg tablet Take 1 tablet by mouth daily.   senna/docusate (SENOKOT-S) 8.6/50 mg tablet Take one tablet by mouth twice daily.  Patient taking differently: Take 2 tablets by mouth twice daily.   venlafaxine XR (EFFEXOR XR) 150 mg capsule Take one capsule by mouth daily with breakfast. Take with food.  Indications: repeated episodes of anxiety, major depressive disorder, panic disorder   vitamins, multi w/minerals 9 mg iron-400 mcg tab Take 1 tablet by mouth daily.     Medication Reconciliation  Medication reconciliation is based on the patient's most recent medication list in the electronic medical record (EMR) including herbal products and OTC medications. The patient's medication list will be updated during patient education, after speaking with the patient and prior to dispensing the medication.    Drug Interactions    Drug-Drug Interactions  Drug-drug interactions were evaluated. There were not clinically significant drug-drug interactions.     Drug-Food Interactions  Drug-food interactions were not evaluated (NA - not oral).  Safety Precautions    Risk Evaluation and Mitigation (REMS) Assessment: REMS is not required for this medication.    Safety precautions were addressed and discussed with the patient as applicable.    Contraindications: Hailey Rodgers does not have contraindications to this medication.      Infection Screening:    TB screening has been completed and result was negative - proceed with treatment.    Hepatitis B screening has been completed and result was negative - proceed with treatment.    Pregnancy Status: Female, not of child-bearing potential, education not applicable.    Follow-up Plan  The initial therapy assessment has been completed and the patient will be contacted to complete education on their regimen.    Follow-up Plan  The medication(s) will be shipped from [facility].    Hailey Rodgers, [facility]

## 2021-05-29 NOTE — Progress Notes
Attempted to reach patient for new etanercept (Enbrel) education. Left voicemail requesting a call back. Provided patient with pharmacist clinic phone number [phone]). If no return call from patient, will attempt again at a later date.

## 2021-05-30 ENCOUNTER — Encounter: Admit: 2021-05-30 | Discharge: 2021-05-30 | Payer: MEDICARE

## 2021-05-30 NOTE — Progress Notes
The Prior Authorization for Enbrel was approved for HALEEMA VANDERHEYDEN  Until 12/22/21.  The copay is $0.00.      The ambulatory pharmacist has been notified of the approval in order to provide education prior to dispense of the medication.  Will await notification from the pharmacist that it is OK to set up the fill per the patient's preferred delivery method.    [facility] Patient Advocate  [phone]

## 2021-06-05 ENCOUNTER — Encounter: Admit: 2021-06-05 | Discharge: 2021-06-05 | Payer: MEDICARE

## 2021-06-07 ENCOUNTER — Encounter: Admit: 2021-06-07 | Discharge: 2021-06-07 | Payer: MEDICARE

## 2021-06-07 MED FILL — ENBREL SURECLICK 50 MG/ML (1 ML) SC PNIJ: 50 mg/mL (1 mL) | SUBCUTANEOUS | 28 days supply | Qty: 4 | Fill #1 | Status: CP

## 2021-06-14 ENCOUNTER — Encounter: Admit: 2021-06-14 | Discharge: 2021-06-14 | Payer: MEDICARE

## 2021-06-14 ENCOUNTER — Ambulatory Visit: Admit: 2021-06-14 | Discharge: 2021-06-15 | Payer: MEDICARE

## 2021-06-14 DIAGNOSIS — R54 Age-related physical debility: Secondary | ICD-10-CM

## 2021-06-14 DIAGNOSIS — U071 COVID-19 virus infection: Secondary | ICD-10-CM

## 2021-06-14 DIAGNOSIS — E669 Obesity, unspecified: Secondary | ICD-10-CM

## 2021-06-14 DIAGNOSIS — R3989 Other symptoms and signs involving the genitourinary system: Secondary | ICD-10-CM

## 2021-06-14 DIAGNOSIS — J40 Bronchitis, not specified as acute or chronic: Secondary | ICD-10-CM

## 2021-06-14 DIAGNOSIS — G479 Sleep disorder, unspecified: Secondary | ICD-10-CM

## 2021-06-14 DIAGNOSIS — E119 Type 2 diabetes mellitus without complications: Secondary | ICD-10-CM

## 2021-06-14 DIAGNOSIS — E7849 Other hyperlipidemia: Secondary | ICD-10-CM

## 2021-06-14 DIAGNOSIS — J45909 Unspecified asthma, uncomplicated: Secondary | ICD-10-CM

## 2021-06-14 DIAGNOSIS — M48061 Spinal stenosis, lumbar region without neurogenic claudication: Secondary | ICD-10-CM

## 2021-06-14 DIAGNOSIS — G47419 Narcolepsy without cataplexy: Secondary | ICD-10-CM

## 2021-06-14 DIAGNOSIS — R32 Unspecified urinary incontinence: Secondary | ICD-10-CM

## 2021-06-14 DIAGNOSIS — K760 Fatty (change of) liver, not elsewhere classified: Secondary | ICD-10-CM

## 2021-06-14 DIAGNOSIS — E034 Atrophy of thyroid (acquired): Secondary | ICD-10-CM

## 2021-06-14 DIAGNOSIS — R29898 Other symptoms and signs involving the musculoskeletal system: Secondary | ICD-10-CM

## 2021-06-14 DIAGNOSIS — H5052 Exophoria: Secondary | ICD-10-CM

## 2021-06-14 DIAGNOSIS — H547 Unspecified visual loss: Secondary | ICD-10-CM

## 2021-06-14 DIAGNOSIS — M4802 Spinal stenosis, cervical region: Secondary | ICD-10-CM

## 2021-06-14 DIAGNOSIS — M1990 Unspecified osteoarthritis, unspecified site: Secondary | ICD-10-CM

## 2021-06-14 DIAGNOSIS — E785 Hyperlipidemia, unspecified: Secondary | ICD-10-CM

## 2021-06-14 DIAGNOSIS — E039 Hypothyroidism, unspecified: Secondary | ICD-10-CM

## 2021-06-14 DIAGNOSIS — M069 Rheumatoid arthritis, unspecified: Secondary | ICD-10-CM

## 2021-06-14 DIAGNOSIS — K5790 Diverticulosis of intestine, part unspecified, without perforation or abscess without bleeding: Secondary | ICD-10-CM

## 2021-06-14 DIAGNOSIS — F3341 Major depressive disorder, recurrent, in partial remission: Secondary | ICD-10-CM

## 2021-06-14 DIAGNOSIS — Z79899 Other long term (current) drug therapy: Secondary | ICD-10-CM

## 2021-06-14 DIAGNOSIS — G629 Polyneuropathy, unspecified: Secondary | ICD-10-CM

## 2021-06-14 DIAGNOSIS — G4733 Obstructive sleep apnea (adult) (pediatric): Secondary | ICD-10-CM

## 2021-06-14 DIAGNOSIS — H25813 Combined forms of age-related cataract, bilateral: Secondary | ICD-10-CM

## 2021-06-14 DIAGNOSIS — R569 Unspecified convulsions: Secondary | ICD-10-CM

## 2021-06-14 DIAGNOSIS — J309 Allergic rhinitis, unspecified: Secondary | ICD-10-CM

## 2021-06-14 DIAGNOSIS — R4189 Other symptoms and signs involving cognitive functions and awareness: Secondary | ICD-10-CM

## 2021-06-14 DIAGNOSIS — R911 Solitary pulmonary nodule: Secondary | ICD-10-CM

## 2021-06-14 DIAGNOSIS — K219 Gastro-esophageal reflux disease without esophagitis: Secondary | ICD-10-CM

## 2021-06-14 DIAGNOSIS — E1165 Type 2 diabetes mellitus with hyperglycemia: Secondary | ICD-10-CM

## 2021-06-14 DIAGNOSIS — H5213 Myopia, bilateral: Secondary | ICD-10-CM

## 2021-06-14 DIAGNOSIS — R413 Other amnesia: Secondary | ICD-10-CM

## 2021-06-14 NOTE — Progress Notes
Body mass index is 34.77 kg/m.    Results for orders placed in visit on 06/14/21    OCT MACULA    Narrative  Macula  Right Eye  Macula findings: Normal foveal contour    Left Eye  Macula findings: Normal foveal contour    OCT MACULA           Macula  Right Eye  Macula findings: Normal foveal contour     Left Eye  Macula findings: Normal foveal contour                   Assessment and Plan:  A/ 1. 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 No signs of macular edema on OCT     2. 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 Myopic astigmatism with presbyopia OU     P/ 1. Reviewed with patient need for tight control of diabetes to help maintain ocular health. Educated on reduced risk of retinopathy if maintains A1c under 7.0. Monitor with dilated examination in 1 year.      2. Monitor for visual change with examination in 1 year. Discussed potential need for cataract surgery in future. Consider referral if worsening vision or increasing symptoms at next examination.        3-4. Prescription for glasses provided to patient

## 2021-06-15 DIAGNOSIS — E113293 Type 2 diabetes mellitus with mild nonproliferative diabetic retinopathy without macular edema, bilateral: Principal | ICD-10-CM

## 2021-06-15 DIAGNOSIS — Z794 Long term (current) use of insulin: Secondary | ICD-10-CM

## 2021-06-27 ENCOUNTER — Encounter: Admit: 2021-06-27 | Discharge: 2021-06-27 | Payer: MEDICARE

## 2021-06-27 ENCOUNTER — Ambulatory Visit: Admit: 2021-06-27 | Discharge: 2021-06-27 | Payer: MEDICARE

## 2021-06-27 DIAGNOSIS — G4733 Obstructive sleep apnea (adult) (pediatric): Secondary | ICD-10-CM

## 2021-07-03 ENCOUNTER — Encounter: Admit: 2021-07-03 | Discharge: 2021-07-03 | Payer: MEDICARE

## 2021-07-03 NOTE — Telephone Encounter
Hi, I called her number but no one picked up. 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 If she currently has COVID with some hypoxia, I would recommend holding the methotrexate and Humira (or Enbrel) temporarily until her COVID symptoms get better (she can check with [place] when it is appropriate to resume her RA meds once she's improved from [place]). The COVID ab infusion would help and also would recommend going to an urgent care or emergency department for any worsening respiratory symptoms or low oxygen levels.

## 2021-07-05 ENCOUNTER — Encounter: Admit: 2021-07-05 | Discharge: 2021-07-05 | Payer: MEDICARE

## 2021-07-05 DIAGNOSIS — F445 Conversion disorder with seizures or convulsions: Secondary | ICD-10-CM

## 2021-07-05 DIAGNOSIS — F411 Generalized anxiety disorder: Secondary | ICD-10-CM

## 2021-07-05 DIAGNOSIS — F341 Dysthymic disorder: Secondary | ICD-10-CM

## 2021-07-05 DIAGNOSIS — F410 Panic disorder [episodic paroxysmal anxiety] without agoraphobia: Secondary | ICD-10-CM

## 2021-07-05 MED ORDER — VENLAFAXINE 150 MG PO CP24
ORAL_CAPSULE | Freq: Every day | 0 refills | Status: AC
Start: 2021-07-05 — End: ?

## 2021-07-09 ENCOUNTER — Encounter: Admit: 2021-07-09 | Discharge: 2021-07-09 | Payer: MEDICARE

## 2021-07-09 MED FILL — ENBREL SURECLICK 50 MG/ML (1 ML) SC PNIJ: 50 mg/mL (1 mL) | SUBCUTANEOUS | 28 days supply | Qty: 4 | Fill #2 | Status: AC

## 2021-07-19 ENCOUNTER — Encounter: Admit: 2021-07-19 | Discharge: 2021-07-19 | Payer: MEDICARE

## 2021-07-19 DIAGNOSIS — R0681 Apnea, not elsewhere classified: Secondary | ICD-10-CM

## 2021-07-19 NOTE — Progress Notes
Sleep study is abnormal. Recommend follow up with sleep clinic for further evaluation and management of sleep disorder.     Sleep study report will be scanned in EMR by the sleep lab, please see the final report for details.

## 2021-07-24 ENCOUNTER — Emergency Department: Admit: 2021-07-24 | Discharge: 2021-07-24 | Payer: MEDICARE

## 2021-07-24 ENCOUNTER — Encounter: Admit: 2021-07-24 | Discharge: 2021-07-24 | Payer: MEDICARE

## 2021-07-24 ENCOUNTER — Emergency Department: Admit: 2021-07-24 | Discharge: 2021-07-25 | Payer: MEDICARE

## 2021-07-24 ENCOUNTER — Ambulatory Visit: Admit: 2021-07-24 | Discharge: 2021-07-24 | Payer: MEDICARE

## 2021-07-24 DIAGNOSIS — H547 Unspecified visual loss: Secondary | ICD-10-CM

## 2021-07-24 DIAGNOSIS — D849 Immunodeficiency, unspecified: Secondary | ICD-10-CM

## 2021-07-24 DIAGNOSIS — K760 Fatty (change of) liver, not elsewhere classified: Secondary | ICD-10-CM

## 2021-07-24 DIAGNOSIS — Z8719 Personal history of other diseases of the digestive system: Secondary | ICD-10-CM

## 2021-07-24 DIAGNOSIS — J40 Bronchitis, not specified as acute or chronic: Secondary | ICD-10-CM

## 2021-07-24 DIAGNOSIS — R4189 Other symptoms and signs involving cognitive functions and awareness: Secondary | ICD-10-CM

## 2021-07-24 DIAGNOSIS — F3341 Major depressive disorder, recurrent, in partial remission: Secondary | ICD-10-CM

## 2021-07-24 DIAGNOSIS — E119 Type 2 diabetes mellitus without complications: Secondary | ICD-10-CM

## 2021-07-24 DIAGNOSIS — E669 Obesity, unspecified: Secondary | ICD-10-CM

## 2021-07-24 DIAGNOSIS — J45909 Unspecified asthma, uncomplicated: Secondary | ICD-10-CM

## 2021-07-24 DIAGNOSIS — R911 Solitary pulmonary nodule: Secondary | ICD-10-CM

## 2021-07-24 DIAGNOSIS — Z79899 Other long term (current) drug therapy: Secondary | ICD-10-CM

## 2021-07-24 DIAGNOSIS — G479 Sleep disorder, unspecified: Secondary | ICD-10-CM

## 2021-07-24 DIAGNOSIS — E785 Hyperlipidemia, unspecified: Secondary | ICD-10-CM

## 2021-07-24 DIAGNOSIS — M48061 Spinal stenosis, lumbar region without neurogenic claudication: Secondary | ICD-10-CM

## 2021-07-24 DIAGNOSIS — R5383 Other fatigue: Secondary | ICD-10-CM

## 2021-07-24 DIAGNOSIS — E034 Atrophy of thyroid (acquired): Secondary | ICD-10-CM

## 2021-07-24 DIAGNOSIS — R29898 Other symptoms and signs involving the musculoskeletal system: Secondary | ICD-10-CM

## 2021-07-24 DIAGNOSIS — K219 Gastro-esophageal reflux disease without esophagitis: Secondary | ICD-10-CM

## 2021-07-24 DIAGNOSIS — R413 Other amnesia: Secondary | ICD-10-CM

## 2021-07-24 DIAGNOSIS — R54 Age-related physical debility: Secondary | ICD-10-CM

## 2021-07-24 DIAGNOSIS — E1169 Type 2 diabetes mellitus with other specified complication: Secondary | ICD-10-CM

## 2021-07-24 DIAGNOSIS — R569 Unspecified convulsions: Secondary | ICD-10-CM

## 2021-07-24 DIAGNOSIS — E039 Hypothyroidism, unspecified: Secondary | ICD-10-CM

## 2021-07-24 DIAGNOSIS — M069 Rheumatoid arthritis, unspecified: Secondary | ICD-10-CM

## 2021-07-24 DIAGNOSIS — K5790 Diverticulosis of intestine, part unspecified, without perforation or abscess without bleeding: Secondary | ICD-10-CM

## 2021-07-24 DIAGNOSIS — G47419 Narcolepsy without cataplexy: Secondary | ICD-10-CM

## 2021-07-24 DIAGNOSIS — R32 Unspecified urinary incontinence: Secondary | ICD-10-CM

## 2021-07-24 DIAGNOSIS — R3989 Other symptoms and signs involving the genitourinary system: Secondary | ICD-10-CM

## 2021-07-24 DIAGNOSIS — M1990 Unspecified osteoarthritis, unspecified site: Secondary | ICD-10-CM

## 2021-07-24 DIAGNOSIS — J309 Allergic rhinitis, unspecified: Secondary | ICD-10-CM

## 2021-07-24 DIAGNOSIS — G4733 Obstructive sleep apnea (adult) (pediatric): Secondary | ICD-10-CM

## 2021-07-24 DIAGNOSIS — U071 COVID-19 virus infection: Secondary | ICD-10-CM

## 2021-07-24 DIAGNOSIS — M4802 Spinal stenosis, cervical region: Secondary | ICD-10-CM

## 2021-07-24 DIAGNOSIS — E7849 Other hyperlipidemia: Secondary | ICD-10-CM

## 2021-07-24 DIAGNOSIS — R400 Somnolence: Secondary | ICD-10-CM

## 2021-07-24 DIAGNOSIS — E1165 Type 2 diabetes mellitus with hyperglycemia: Secondary | ICD-10-CM

## 2021-07-24 DIAGNOSIS — G4710 Hypersomnia, unspecified: Secondary | ICD-10-CM

## 2021-07-24 DIAGNOSIS — G629 Polyneuropathy, unspecified: Secondary | ICD-10-CM

## 2021-07-24 LAB — URINALYSIS DIPSTICK REFLEX TO CULTURE
NITRITE: NEGATIVE U/L (ref 7–40)
URINE ASCORBIC ACID, UA: NEGATIVE U/L (ref 7–56)
URINE BILE: NEGATIVE mg/dL (ref 0.3–1.2)

## 2021-07-24 LAB — POC BLOOD GAS VEN
BASE EXCESS, VEN POC: 0 MMOL/L
BICARB, VEN POC: 25 MMOL/L
O2 SAT, VEN POC: 58 % (ref 55–71)
PCO2, VEN POC: 44 mmHg (ref 36–50)
PH, VEN POC: 7.3 (ref 7.30–7.40)
PO2, VEN POC: 31 mmHg — ABNORMAL LOW (ref 33–48)

## 2021-07-24 LAB — TSH WITH FREE T4 REFLEX: TSH: 1.7 uU/mL (ref 0.35–5.00)

## 2021-07-24 LAB — POC GLUCOSE: POC GLUCOSE: 179 mg/dL — ABNORMAL HIGH (ref 70–100)

## 2021-07-24 LAB — CBC AND DIFF
ABSOLUTE BASO COUNT: 0.1 K/UL (ref 0–0.20)
ABSOLUTE EOS COUNT: 0.3 K/UL (ref 0–0.45)
ABSOLUTE MONO COUNT: 0.6 K/UL (ref 0–0.80)
EOSINOPHILS %: 5 % — AB (ref 0–5)
LYMPHOCYTES %: 29 % (ref 24–44)
MCH: 27 pg (ref 26–34)
MDW (MONOCYTE DISTRIBUTION WIDTH): 17 (ref <20.7)
PLATELET COUNT: 205 K/UL (ref 150–400)
RDW: 14 % — AB (ref 11–15)
WBC COUNT: 5.9 K/UL (ref 4.5–11.0)

## 2021-07-24 LAB — COMPREHENSIVE METABOLIC PANEL
ALBUMIN: 3.6 g/dL (ref 3.5–5.0)
ANION GAP: 8 K/UL (ref 3–12)
EGFR: >60 mL/min (ref >60)
SODIUM: 136 MMOL/L — ABNORMAL LOW (ref 137–147)

## 2021-07-24 LAB — URINALYSIS MICROSCOPIC REFLEX TO CULTURE

## 2021-07-24 LAB — VITAMIN B12: VITAMIN B12: 415 pg/mL (ref 180–914)

## 2021-07-24 NOTE — Response Teams
Rapid Response Team Progress Note    Date: 07/24/2021 Time: 12:51 PM  Patient: Hailey Rodgers  Attending: No att. providers found Service: Emergency Medicine  Admission Date: (Not on file)  LOS: 0 days    A Code/Rapid Response Timeline Event Report has been created for this patient on 07/24/21 at 1217. Patient in rheumatology clinic for routine appt when patient became very lethargic and difficult to arouse with sternal rub. When patient woke up, patient complains of right upper extremity numbness and pain. RRT activated at this time. Upon RRT arrival patient lethargic but oriented, VSS. Patient assisted to wheelchair and transferred to ED with RRT RN. Upon arrival to ED patient difficult to arouse again. After vigorous sternal rub and painful stimuli, patient opened eyes and answered questions. RRT dismissed at this time.       Maryann Conners, RN

## 2021-07-24 NOTE — ED Notes
Report received from [facility], [place].   Pt is A&O x4. Resting in bed. Declines any needs at this time. Airway patent, speaking freely without difficulty. Skin color appropriate for race, warm & dry.    Bed in lowest position. Call light within reach.

## 2021-07-24 NOTE — Telephone Encounter
RN upon chart review discovered patient is currently admitted to hospital. Therefore will not call at this time to offer earlier appt. Will continue to monitor.

## 2021-07-24 NOTE — Progress Notes
Rheumatology Follow Up Visit   Hailey Rodgers is a 70 y.o. female who presents today for a follow up visit for doubly seropositive, nonerosive rheumatoid arthritis.   ?  BRIEF SUMMARY:   Patient is a Caucasian female with history of DM2, hypothyroidism, pseudoseizure and depression who presented to the hospital in 09/2020 after a fall and joint pain. Affected joints include?hands and wrists (wrists radiating down to her fingertips with the right hand more than the left hand), right shoulder, left knee, and the bottoms of her heels.?Pain had inflammatory characteristics by history (warmth and swelling). On physical exam, patient had synovitis on physical exam and possibly tenosynovitis in the wrists. Patient also had diffuse swelling in both hands. Labs showed an elevated sedimentation rate (43) and CRP (1.98 mg/dL). Labs also showed elevated rheumatoid factor (>1200) and CCP antibody (230.3). Right hand Xray, bilateral wrist Xrays, and bilateral knee Xrays showed degenerative changes but no evidence of inflammatory arthritis. Patient initiated on prednisone and methotrexate. As patient was still having a significant amount of pain initiating on methotrexate, patient was also started on Humira.  ?  Current Rheumatology Medications:  ? Methotrexate 20 mg by mouth weekly (10/2020 - current)   ? Enbrel 50 mg SQ weekly 06/2021 - current  ? Folic acid 4 mg daily (was having oral sores)    Past treatment  ? Humira 40 mg SQ q14d then has tried q7d (11/2020 - 05/2021) she continued to have flares  ?  INTERVAL HISTORY:    We saw her in January 2022 in clinic for double positive nonerosive RA.  She had high disease activity on CDAI score at that time she was on Humira 40 mg subcutaneous every 14 days and methotrexate 20 mg weekly and was on a slow prednisone taper (7.5 mg daily for 2 weeks, then 5 mg daily for 2 weeks, then 2.5 mg daily for 2 weeks then stop) in April 2022.  Humira was switched to weekly dosing because of uncontrolled flares.  In May 2022 she went to the ED as she had right upper extremity swelling and redness they were not sure if it is secondary to an infection, blood clot or RA flare.  She was admitted and we were consulted.  We determined her acute onset right wrist and hand swelling and pain was likely secondary to RA flare with unlikely septic etiology.  At that time she also had left ankle and shoulder involvement her methotrexate and Humira were resumed and she was provided prednisone burst to taper. she was discharged stable in early June 2022.  She was supposed to have an outpatient follow-up in early June with Dr. Renee Rival but he was able to see her while she was admitted.  As she continued to have flares on Humira she was switched to Enbrel 50 mg subcutaneous weekly in July 2022.  In early July she got infected with COVID so I told her to temporarily stop her Enbrel and methotrexate until she feels better. I instructed her to resume her medications once she is feeling better.    Her labs in June 2022 are not showing any signs of DMARD toxicity.  She has baseline hemoglobin of 10-11. Her last bone density scan was in 2017, left distal radius with a T score of +0.9. Sleep study 05/2021: nocturnal hypoxemia, OSA; BiPAP not working at home.    Both the patient and the daughter states that her generalized joint pains and swelling have not really improved since January 2022.  She has prolonged morning stiffness.  She states she did not feel any improvement with methotrexate and Humira initially.  In addition they state today that it is too early to tell if Enbrel is working.  She denied recent fever or cough or diarrhea.  She states her hands are very stiff and painful.  She states she had difficulty sleeping last night because of the pain so she feels very sleepy today.  She states she temporarily stopped taking her methotrexate and Enbrel when she got COVID last month.  She restarted her methotrexate a week prior to this visit and restarted her Enbrel yesterday.       REVIEW OF SYSTEMS:  Review of Systems   Constitutional: Positive for activity change, appetite change and fatigue.   HENT: Positive for dental problem, drooling, hearing loss, rhinorrhea and voice change.    Eyes: Positive for itching and visual disturbance.   Respiratory: Positive for apnea, shortness of breath and stridor.    Cardiovascular: Positive for leg swelling.   Gastrointestinal: Positive for constipation.   Genitourinary: Positive for dysuria, enuresis and frequency.   Musculoskeletal: Positive for arthralgias, back pain, gait problem, joint swelling, myalgias, neck pain and neck stiffness.   Neurological: Positive for seizures, weakness and light-headedness.   Psychiatric/Behavioral: Positive for agitation, behavioral problems, confusion, decreased concentration, dysphoric mood and sleep disturbance. The patient is nervous/anxious.      PAST MEDICAL HISTORY:  Medical History:   Diagnosis Date   ? Age-related physical debility 10/06/2020   ? Allergic rhinitis    ? Arthritis    ? Asthma    ? Bronchitis    ? Cervical stenosis of spinal canal    ? COVID-19 virus infection 10/06/2020    06/2020   ? Disorganized thinking    ? Diverticulosis 10/06/2020   ? DM (diabetes mellitus) (HCC)    ? Dyslipidemia    ? Fatty liver disease, nonalcoholic    ? Gastroesophageal reflux disease without esophagitis 10/06/2020   ? GERD (gastroesophageal reflux disease)    ? Hypothyroidism    ? 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 Leg weakness    ? Lumbar stenosis    ? Lung nodule 10/06/2020    10/06/2020 5. ?Right upper lobe groundglass nodule measuring 5 mm which may be  infectious or inflammatory. Multiple additional sub-5 mm nodules  bilaterally, most lymph nodes or granulomas. Follow-up CT in 12 months  recommended for further evaluation if clinically indicated.    ? Memory loss    ? Narcolepsy    ? Obesity    ? OSA (obstructive sleep apnea) ? Other hyperlipidemia 10/06/2020   ? Peripheral neuropathy    ? Polypharmacy 01/04/2021   ? Pseudoseizures (HCC)    ? 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 Rheumatoid arthritis (HCC) 10/06/2020   ? Sleep disorder     Sleep Apnea & Narcolepsy   ? Type 2 diabetes mellitus with hyperglycemia, with long-term current use of insulin (HCC) 10/06/2020   ? Urinary incontinence    ? Urinary problem     urinary frequency   ? Vision decreased     diplopia       PAST SURGICAL HISTORY:  Surgical History:   Procedure Laterality Date   ? HX ADENOIDECTOMY  1962   ? HX TONSILLECTOMY  1962   ? TUBAL LIGATION  1977   ? RECTAL SURGERY  1979    for rectal prolapse; bladder  repair   ? CHOLECYSTECTOMY  1984   ? HYSTERECTOMY  2000   ? BREAST BIOPSY Right 2012   ? COLONOSCOPY  2016   ? CERVICAL 3 TO THORACIC 7 DECOMPRESSION AND FUSION Bilateral 07/18/2015    Performed by Storm Frisk, MD at [facility] [place]   ? BLEPHAROPLASTY     ? HERNIA REPAIR  1984/2007   ? SEPTOPLASTY  1986/2013   ? UPPER GASTROINTESTINAL ENDOSCOPY         SOCIAL HISTORY:  Social History     Tobacco Use   ? Smoking status: Never Smoker   ? Smokeless tobacco: Never Used   Substance Use Topics   ? Alcohol use: No     Alcohol/week: 0.0 standard drinks       FAMILY HISTORY:  Family History   Problem Relation Age of Onset   ? Cancer Mother    ? Thyroid Disease Mother    ? Hypertension Mother    ? Cataract Mother    ? Cancer Father         pancreatic   ? Cancer Other    ? Stroke Paternal Grandfather    ? Cancer Paternal Aunt    ? Coronary Artery Disease Paternal Grandmother    ? Strabismus Neg Hx    ? Retinal Detachment Neg Hx    ? Neurologic Disorder Neg Hx    ? Macular Degen Neg Hx    ? Diabetes Neg Hx    ? Glaucoma Neg Hx    ? Blindness Neg Hx    ? Amblyopia Neg Hx    ? Autoimmune Disease Neg Hx        ALLERGIES:  Allergies   Allergen Reactions   ? Amoxicillin-Pot Clavulanate HIVES   ? Gemfibrozil HIVES   ? Trazodone HALLUCINATIONS   ? Adhesive Tape (Rosins) RASH   ? Atorvastatin SEE COMMENTS     States it caused her to have a fatty liver.   Reaction: LFT elevation; Comment: LIPITOR  Reaction: LFT elevation; Comment: LIPITOR  Reaction: LFT elevation; Comment: LIPITOR  Reaction: LFT elevation; Comment: LIPITOR     ? Latex RASH   ? Bupropion SEE COMMENTS     Allergy recorded in SMS: WELLBUTRIN~Reactions: ABNORMAL SEIZUR   ? Cymbalta [Duloxetine] UNKNOWN   ? Levaquin [Levofloxacin] DIARRHEA       MEDICATIONS:  ? acetaminophen (TYLENOL EXTRA STRENGTH) 500 mg tablet Take two tablets by mouth every 6 hours as needed. Max of 4,000 mg of acetaminophen in 24 hours.  Indications: pain   ? aspirin 81 mg chewable tablet Chew 81 mg by mouth at bedtime daily.   ? blood-glucose sensor (DEXCOM G6 SENSOR MISC) Use  as directed.   ? calcium carbonate/vitamin D-3 (OSCAL-500+D) 1250 mg/200 unit tablet Take one tablet by mouth twice daily. Calcium Carb 1250mg  delivers 500mg  elemental Ca   ? celecoxib (CELEBREX) 100 mg capsule Take 100 mg by mouth twice daily.   ? DIPH/LIDO/ANTACID 1:1:1 (COMPOUND) Swish and Spit 5 mL by mouth as directed every 4 hours as needed.   ? dulaglutide (TRULICITY) 1.5 mg/0.5 mL injection pen Inject 1.5 mg under the skin every Wednesday.   ? ergocalciferol (VITAMIN D-2) 50,000 unit capsule Take 50,000 Units by mouth every 7 days.   ? etanercept (ENBREL SURECLICK) 50 mg/mL (1 mL) injection pen Inject 1 mL under the skin every 7 days.   ? ferrous sulfate (FEOSOL) 325 mg (65 mg iron) tablet Take 325 mg by mouth twice daily. Take on  an empty stomach at least 1 hour before or 2 hours after food.   ? fexofenadine(+) (ALLEGRA) 180 mg tablet Take 180 mg by mouth daily.   ? fluocinolone acetonide oil (DERMOTIC OIL) 0.01 % otic drops INSTILL 3 DROPS INTO NOSE DAILY AS DIRECTED   ? folic acid (FOLVITE) 1 mg tablet Take four tablets by mouth daily.   ? furosemide (LASIX) 40 mg tablet Take 40 mg by mouth daily as needed.   ? insulin aspart U-100 (NOVOLOG U-100 INSULIN ASPART) 100 unit/mL injection Inject twelve Units under the skin three times daily with meals. Take 12 units with breakfast, 22 units with lunch, and 30 units with dinner.  Indications: type 2 diabetes mellitus (Patient taking differently: Inject 25 Units under the skin three times daily before meals. Plus sliding scale: if BG 150-159 = 1 unit, 160-169 = 2 units  Indications: type 2 diabetes mellitus)   ? insulin degludec (TRESIBA FLEXTOUCH) 200 unit/mL (3 mL) injection PEN Inject eighty Units under the skin at bedtime daily. (Patient taking differently: Inject 100 Units under the skin at bedtime daily.)   ? Insulin Needles (Disposable) (NANO PEN NEEDLE) 32 gauge x 5/32 ndle Use 1 Each as directed before meals and at bedtime.   ? lactobacillus rhamnosus GG (CULTURELLE) 15 billion cell capsule Take 1 capsule by mouth as directed daily.   ? levothyroxine (SYNTHROID) 88 mcg tablet Take 88 mcg by mouth daily.   ? lidocaine (ASPERCREME PATCH) 4 % topical patch Apply 1 patch topically to affected area daily.   ? methotrexate sodium 2.5 mg tablet Take eight tablets by mouth every 7 days. (Patient taking differently: Take 20 mg by mouth every Friday.)   ? mirabegron (MYRBETRIQ) 50 mg ER tablet Take 50 mg by mouth daily.   ? nortriptyline (PAMELOR) 25 mg capsule Take 25 mg by mouth at bedtime daily.   ? NYSTOP 100,000 unit/gram topical powder Apply to clean, dry skin 2-3 times daily for yeast infection   ? oxyCODONE/acetaminophen (PERCOCET) 5/325 mg tablet Take one tablet to two tablets by mouth every 6 hours as needed   ? pantoprazole DR (PROTONIX) 40 mg tablet Take 40 mg by mouth twice daily.   ? pioglitazone (ACTOS) 15 mg tablet Take 15 mg by mouth daily.   ? polyethylene glycol 3350 (MIRALAX) 17 g packet Take one packet by mouth twice daily as needed.   ? potassium chloride (KLOR-CON 10) 10 mEq tablet Take 2 tablets by mouth every day as needed if taking furosemide for edema.   ? pramipexole (MIRAPEX) 0.75 mg tablet Take 0.75 mg by mouth three times daily.   ? rosuvastatin (CRESTOR) 20 mg tablet Take 1 tablet by mouth daily.   ? senna/docusate (SENOKOT-S) 8.6/50 mg tablet Take one tablet by mouth twice daily. (Patient taking differently: Take 2 tablets by mouth twice daily.)   ? venlafaxine XR (EFFEXOR XR) 150 mg capsule TAKE 1 CAPSULE BY MOUTH ONCE DAILY WITH  BREAKFAST   ? vitamins, multi w/minerals 9 mg iron-400 mcg tab Take 1 tablet by mouth daily.           PHYSICAL EXAM:  Vitals:    07/24/21 1111 07/24/21 1219   BP: 134/62 125/56   BP Source: Arm, Right Upper Arm, Left Upper   Pulse: 83 89   Temp: 36.9 ?C (98.5 ?F)    Resp: 18    SpO2: 96% 97%   TempSrc: Oral    PainSc: Seven    Weight: 101.6 kg (  224 lb)    Height: 170.2 cm (5' 7)      body mass index is 35.08 kg/m?[name]     Physical Exam  Vitals and nursing note reviewed.   Constitutional:       General: She is not in acute distress.     Appearance: She is obese.      Comments: Dozes off frequently but arousable easily to verbal stimulus   HENT:      Head: Normocephalic and atraumatic.      Right Ear: External ear normal.      Left Ear: External ear normal.      Nose: No rhinorrhea.      Mouth/Throat:      Mouth: Mucous membranes are moist.   Eyes:      Extraocular Movements: Extraocular movements intact.      Conjunctiva/sclera: Conjunctivae normal.   Cardiovascular:      Rate and Rhythm: Normal rate.      Heart sounds: No murmur heard.  Pulmonary:      Effort: Pulmonary effort is normal. No respiratory distress.   Abdominal:      General: There is no distension.      Palpations: Abdomen is soft.   Musculoskeletal:         General: Swelling and tenderness present.      Cervical back: Normal range of motion.      Comments: She has erythematous, swollen and tender bilateral wrists, MCPs and PIPs.  She has erythematous right elbow which is also tender.   Skin:     General: Skin is dry.   Neurological:      Mental Status: She is disoriented.   Psychiatric: Mood and Affect: Mood normal.         Behavior: Behavior normal.                 LABS:  CBC w/Diff    Lab Results   Component Value Date/Time    WBC 6.8 05/27/2021 06:48 AM    RBC 4.15 05/27/2021 06:48 AM    HGB 11.1 (L) 05/27/2021 06:48 AM    HCT 34.2 (L) 05/27/2021 06:48 AM    MCV 82.2 05/27/2021 06:48 AM    MCH 26.8 05/27/2021 06:48 AM    MCHC 32.6 05/27/2021 06:48 AM    RDW 16.3 (H) 05/27/2021 06:48 AM    PLTCT 292 05/27/2021 06:48 AM    MPV 8.1 05/27/2021 06:48 AM    Lab Results   Component Value Date/Time    NEUT 43 05/27/2021 06:48 AM    ANC 2.93 05/27/2021 06:48 AM    LYMA 43 05/27/2021 06:48 AM    ALC 2.90 05/27/2021 06:48 AM    MONA 9 05/27/2021 06:48 AM    AMC 0.59 05/27/2021 06:48 AM    EOSA 4 05/27/2021 06:48 AM    AEC 0.27 05/27/2021 06:48 AM    BASA 1 05/27/2021 06:48 AM    ABC 0.06 05/27/2021 06:48 AM        Comprehensive Metabolic Profile    Lab Results   Component Value Date/Time    NA 140 05/27/2021 06:48 AM    K 4.1 05/27/2021 06:48 AM    CL 101 05/27/2021 06:48 AM    CO2 29 05/27/2021 06:48 AM    GAP 10 05/27/2021 06:48 AM    BUN 15 05/27/2021 06:48 AM    CR 0.53 05/27/2021 06:48 AM    GLU 163 (H) 05/27/2021 06:48 AM    GLU 90 11/24/2004  06:10 PM    Lab Results   Component Value Date/Time    CA 9.0 05/27/2021 06:48 AM    PO4 3.8 10/07/2020 04:07 AM    ALBUMIN 3.4 (L) 05/27/2021 06:48 AM    TOTPROT 5.9 (L) 05/27/2021 06:48 AM    ALKPHOS 114 (H) 05/27/2021 06:48 AM    AST 28 05/27/2021 06:48 AM    ALT 28 05/27/2021 06:48 AM    TOTBILI 0.3 05/27/2021 06:48 AM    GFR >60 11/11/2020 07:21 AM    GFRAA >60 11/11/2020 07:21 AM        IMAGING:   Left shoulder x ray 05/25/21  1. ?No fracture or dislocation identified. Demineralization.   2. ?Large scalloped lucencies are noted within the greater tuberosity as well as additional round lucencies in the humeral head and possibly within the distal clavicle. These are concerning for erosions. Lucency within the inferior glenoid is favored to reflect degenerative change.   3. ?Moderate narrowing of the glenohumeral joint with moderate spurring of the inferior humeral head.   4. ?Loop recorder is noted over the chest.     ASSESSMENT:  1. Rheumatoid arthritis involving multiple sites, unspecified whether rheumatoid factor present (HCC)    2. Insulin dependent type 2 diabetes mellitus (HCC)    3. Lethargy    4. Immunosuppressed status (HCC)    5. Hypothyroidism, unspecified type    6. Type 2 diabetes mellitus with other specified complication, with long-term current use of insulin (HCC)    7. History of colonic diverticulitis    8. Nonalcoholic fatty liver disease        IMPRESSION:   Hailey Rodgers is a 70 y.o. female with history of double positive rheumatoid arthritis (currently on Enbrel 50 mg subcutaneous weekly and methotrexate 20 mg weekly), Type 2 diabetes, nonalcoholic fatty liver disease, history of diverticulitis (remote), hypothyroidism, pseudoseizure, obstructive sleep apnea (current BiPAP at home not functioning).  On evaluation today, she has high disease activity for her rheumatoid arthritis.  She has synovitis on physical exam. she has not responded well to Humira since January 2022 and she has only had about an months worth of treatment with Enbrel 50 mg subcutaneous weekly which was interrupted when she infected with COVID last month.  Of note she has only received 1 COVID-vaccine.  However during our evaluation the patient has been dozing off frequently.  Initially arousable to verbal stimulus, however at one point we had to sternal rub her.  She was alert and oriented after however she started complaining of new right upper extremity numbness.  Repeat vitals were BP 125/56, heart rate 89, respiratory rate 18.  Her point-of-care blood glucose is 216.  Rapid response was called and she was brought to the ED for altered mentation to rule out CVA.  However we were able to discuss with both the patient and her daughter our plan in the future to switch her to a different biologic after a return visit after she gets assessed for her lethargy.    PLAN:   -will keep Enbrel 50mg  sq weekly and methotrexate 20 mg weekly for now we will have her back in 2 months to reassess.  In the meantime we will have our pharmacy team call her and discuss her 2 options: Orencia vs Rituxan.  In the future once her disease activity is controlled with a biologic we will plan to wean off of MTX due to her NAFLD.  Currently she has no evidence of steatohepatitis so  we will continue to monitor the more toxicity every 3 months. She has history of diverticulitis so JAK2 inhibitor and IL6 inhibitor would not be a good option for the patient. She has poorly controlled T2DM (most recent A1c 9) so systemic corticosteroids would not be a good option at this time  - return to clinic in 2 months      Orders Placed This Encounter   ? POC GLUCOSE   ? POC GLUCOSE QUANTITATIVE BLOOD     There are no Patient Instructions on file for this visit.   Future Appointments   Date Time Provider Department Center   08/09/2021 10:30 AM Marlinda Mike, DO [facility] Psychiatry   12/05/2021  1:10 PM Trish Mage, Jackqulyn Livings, MD [facility] Neurology   12/18/2021  3:00 PM Dekeyser, Karrie Doffing, OD [facility] Ophthalmolog     Visit Disposition     Dispositions    ? Return in about 2 months (around 09/23/2021) for In-Person.            Patient seen and discussed with Dr. Noreene Filbert.  Thank you for allowing [place] to participate in the care of this patient.   Vernelle Emerald, MD  Rheumatology Fellow, PGY4  [facility]  Division of Rheumatology  [address]  [place], [place] [address]

## 2021-07-24 NOTE — ED Notes
Pt appeared significantly more lethargic and harder to arouse upon MD entering room, it appeared to be a change from when RN interviewed pt. Pt needed significant reprompting and questioning to keep her aroused in conversation. RN at bedside to start PIV and collect labs.

## 2021-07-25 ENCOUNTER — Encounter: Admit: 2021-07-25 | Discharge: 2021-07-25 | Payer: MEDICARE

## 2021-07-25 NOTE — Consults
Neurology Consult/H&P Note      Admission Date: 07/24/2021                                                LOS: 0 days    Reason for Consult:  AMS, somnolence     Consult type: Opinion with orders    Assessment: 70 y.o. 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      Labs:  CBC, CMP, UA- unremarkable   Mg 1.7  Imaging:  CT head: No acute intracranial hemorrhage or mass effect.    CXR: negative     MRI head 02/2021: Unremarkable unenhanced MRI scan of the head. Motion artifact   is present, but there is no obvious acute intracranial process.     Ambulatory EEG 2019- multiple dissociative events captured- ann non-epileptic     Problem List:  1. PNES   - Spell today in Rheum clinic  2. 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) and pain    - metabolic w/u thus far reassuring       Recommendation:  - Discussed DC pramipexole as can lead to sleep attacks but pt reports her RLS is too sever to discontinue it and she has been refractory to GBP, tramadol, and wishes to avoid more narcotic  - OPT sleep clinic appt to w/u possible narcolepsy and re-evaluate OSA- will attempt to obtain for pt ASAP  - Agree with VBG  - Added TSH and B12   - No indication for admission to Neurology presently as work up is all outpt       The patient was evaluated for an acute change in neurologic status/an illness that may pose threat to life or function. I reviewed lab data, personally reviewed neuroimaging (CT). Case was discussed with family and ED team.      Neurology Swing Consult Service:  > I can be contacted for patient questions on Voalte between 10AM and 11PM.  > Urgent questions (prior to 10AM) can otherwise be directed to the Neurology teaching service at pager [phone].   > Calls to the Neurology swing pager [phone]), will not be returned prior to 12PM     Alden Hipp, DO  Clinical Associate Professor  Department of Neurology     ______________________________________________________________________    History of Present Illness: Hailey Rodgers is a 70 y.o. 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     Per pt and daughter, she has a PNES event in rheum clinic. Daughter says PNES spells are her seeming to fall asleep and being unable to wake, even with painful sternal rub. Since in the ED, she has been having fluctuating sleepiness and drifts off while in conversation. She is able to wake with loud voice or tactile stimulation. Daughter says this is not typical of her PNES> Pt says she has spells like this at home. She wakes feeling tired, sleeps throughout the day. May be fine w/o sleepiness the next day. This happens in  clusters about every 6 weeks.     Last night, she held her pain medication in preparation for the upcoming rheumatology appt today. She did not sleep well or very much overnight. Recalls waking to go the the restroom and feeling like she could not stay awake. Not sue how she got back to bed. Afraid she was going to fall. She admits to OSA and has a CPAP. She starts by wearing it nightly but it comes off through the night. She says the CPAP has been recalled. She has a pulmonologist she follows with locally. Never had an eval for narcolepsy. Denies taking any sleeping agents. Is on pramipexole but has dose reduced this medication. Has tried GBP, tramadol for RLS without benefit.     She had MRI head 3/22 and was normal.     She has upcoming appt with psychologist on 8/3.      Medical History:   Diagnosis Date   ? Age-related physical debility 10/06/2020   ? Allergic rhinitis    ? Arthritis    ? Asthma    ? Bronchitis    ? Cervical stenosis of spinal canal    ? COVID-19 virus infection 10/06/2020    06/2020   ? Disorganized thinking    ? Diverticulosis 10/06/2020   ? DM (diabetes mellitus) (HCC)    ? Dyslipidemia    ? Fatty liver disease, nonalcoholic    ? Gastroesophageal reflux disease without esophagitis 10/06/2020   ? GERD (gastroesophageal reflux disease)    ? Hypothyroidism    ? 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 Leg weakness    ? Lumbar stenosis    ? Lung nodule 10/06/2020    10/06/2020 5. ?Right upper lobe groundglass nodule measuring 5 mm which may be  infectious or inflammatory. Multiple additional sub-5 mm nodules  bilaterally, most lymph nodes or granulomas. Follow-up CT in 12 months  recommended for further evaluation if clinically indicated.    ? Memory loss    ? Narcolepsy    ? Obesity    ? OSA (obstructive sleep apnea)    ? Other hyperlipidemia 10/06/2020   ? Peripheral neuropathy    ? Polypharmacy 01/04/2021   ? Pseudoseizures (HCC)    ? 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 Rheumatoid arthritis (HCC) 10/06/2020   ? Sleep disorder     Sleep Apnea & Narcolepsy   ? Type 2 diabetes mellitus with hyperglycemia, with long-term current use of insulin (HCC) 10/06/2020   ? Urinary incontinence    ? Urinary problem     urinary frequency   ? Vision decreased     diplopia       Surgical History:   Procedure Laterality Date   ? HX ADENOIDECTOMY  1962   ? HX TONSILLECTOMY  1962   ? TUBAL LIGATION  1977   ? RECTAL SURGERY  1979    for rectal prolapse; bladder repair   ? CHOLECYSTECTOMY  1984   ? HYSTERECTOMY  2000   ? BREAST BIOPSY Right 2012   ? COLONOSCOPY  2016   ? CERVICAL 3 TO THORACIC 7 DECOMPRESSION AND FUSION Bilateral 07/18/2015    Performed by Storm Frisk, MD at [facility] OR   ? BLEPHAROPLASTY     ? HERNIA REPAIR  1984/2007   ? SEPTOPLASTY  1986/2013   ? UPPER GASTROINTESTINAL ENDOSCOPY         Social History     Tobacco Use   ?  Smoking status: Never Smoker   ? Smokeless tobacco: Never Used   Vaping Use   ? Vaping Use: Never used   Substance Use Topics   ? Alcohol use: No     Alcohol/week: 0.0 standard drinks   ? Drug use: No       Family History   Problem Relation Age of Onset   ? Cancer Mother    ? Thyroid Disease Mother    ? Hypertension Mother    ? Cataract Mother    ? Cancer Father         pancreatic   ? Cancer Other    ? Stroke Paternal Grandfather    ? Cancer Paternal Aunt    ? Coronary Artery Disease Paternal Grandmother    ? Strabismus Neg Hx    ? Retinal Detachment Neg Hx    ? Neurologic Disorder Neg Hx    ? Macular Degen Neg Hx    ? Diabetes Neg Hx    ? Glaucoma Neg Hx    ? Blindness Neg Hx    ? Amblyopia Neg Hx    ? Autoimmune Disease Neg Hx        Allergies:  Amoxicillin-pot clavulanate, Gemfibrozil, Trazodone, Adhesive tape (rosins), Atorvastatin, Latex, Bupropion, Cymbalta [duloxetine], and Levaquin [levofloxacin]    Scheduled Meds:Continuous Infusions:  PRN and Respiratory Meds:    No current facility-administered medications on file prior to encounter.     Current Outpatient Medications on File Prior to Encounter   Medication Sig Dispense Refill   ? acetaminophen (TYLENOL EXTRA STRENGTH) 500 mg tablet Take two tablets by mouth every 6 hours as needed. Max of 4,000 mg of acetaminophen in 24 hours.  Indications: pain 90 tablet 0   ? aspirin 81 mg chewable tablet Chew 81 mg by mouth at bedtime daily.     ? blood-glucose sensor (DEXCOM G6 SENSOR MISC) Use  as directed.     ? calcium carbonate/vitamin D-3 (OSCAL-500+D) 1250 mg/200 unit tablet Take one tablet by mouth twice daily. Calcium Carb 1250mg  delivers 500mg  elemental Ca 180 tablet    ? celecoxib (CELEBREX) 100 mg capsule Take 100 mg by mouth twice daily.     ? DIPH/LIDO/ANTACID 1:1:1 (COMPOUND) Swish and Spit 5 mL by mouth as directed every 4 hours as needed. 300 mL 3   ? dulaglutide (TRULICITY) 1.5 mg/0.5 mL injection pen Inject 1.5 mg under the skin every Wednesday.     ? ergocalciferol (VITAMIN D-2) 50,000 unit capsule Take 50,000 Units by mouth every 7 days.     ? etanercept (ENBREL SURECLICK) 50 mg/mL (1 mL) injection pen Inject 1 mL under the skin every 7 days. 4 each 5   ? ferrous sulfate (FEOSOL) 325 mg (65 mg iron) tablet Take 325 mg by mouth twice daily. Take on an empty stomach at least 1 hour before or 2 hours after food.     ? fexofenadine(+) (ALLEGRA) 180 mg tablet Take 180 mg by mouth daily.     ? fluocinolone acetonide oil (DERMOTIC OIL) 0.01 % otic drops INSTILL 3 DROPS INTO NOSE DAILY AS DIRECTED     ? folic acid (FOLVITE) 1 mg tablet Take four tablets by mouth daily. 120 tablet 0   ? furosemide (LASIX) 40 mg tablet Take 40 mg by mouth daily as needed.     ? insulin aspart U-100 (NOVOLOG U-100 INSULIN ASPART) 100 unit/mL injection Inject twelve Units under the skin three times daily with meals. Take 12 units with breakfast, 22  units with lunch, and 30 units with dinner.  Indications: type 2 diabetes mellitus (Patient taking differently: Inject 25 Units under the skin three times daily before meals. Plus sliding scale: if BG 150-159 = 1 unit, 160-169 = 2 units  Indications: type 2 diabetes mellitus) 15 mL 1   ? insulin degludec (TRESIBA FLEXTOUCH) 200 unit/mL (3 mL) injection PEN Inject eighty Units under the skin at bedtime daily. (Patient taking differently: Inject 100 Units under the skin at bedtime daily.) 9 mL    ? Insulin Needles (Disposable) (NANO PEN NEEDLE) 32 gauge x 5/32 ndle Use 1 Each as directed before meals and at bedtime. 100 Each 1   ? lactobacillus rhamnosus GG (CULTURELLE) 15 billion cell capsule Take 1 capsule by mouth as directed daily.     ? levothyroxine (SYNTHROID) 88 mcg tablet Take 88 mcg by mouth daily.     ? lidocaine (ASPERCREME PATCH) 4 % topical patch Apply 1 patch topically to affected area daily.     ? methotrexate sodium 2.5 mg tablet Take eight tablets by mouth every 7 days. (Patient taking differently: Take 20 mg by mouth every Friday.) 72 tablet 2   ? mirabegron (MYRBETRIQ) 50 mg ER tablet Take 50 mg by mouth daily.     ? nortriptyline (PAMELOR) 25 mg capsule Take 25 mg by mouth at bedtime daily.     ? NYSTOP 100,000 unit/gram topical powder Apply to clean, dry skin 2-3 times daily for yeast infection     ? oxyCODONE/acetaminophen (PERCOCET) 5/325 mg tablet Take one tablet to two tablets by mouth every 6 hours as needed 30 tablet 0   ? pantoprazole DR (PROTONIX) 40 mg tablet Take 40 mg by mouth twice daily.     ? pioglitazone (ACTOS) 15 mg tablet Take 15 mg by mouth daily.     ? polyethylene glycol 3350 (MIRALAX) 17 g packet Take one packet by mouth twice daily as needed. 12 each    ? potassium chloride (KLOR-CON 10) 10 mEq tablet Take 2 tablets by mouth every day as needed if taking furosemide for edema.     ? pramipexole (MIRAPEX) 0.75 mg tablet Take 0.75 mg by mouth three times daily.     ? rosuvastatin (CRESTOR) 20 mg tablet Take 1 tablet by mouth daily.     ? senna/docusate (SENOKOT-S) 8.6/50 mg tablet Take one tablet by mouth twice daily. (Patient taking differently: Take 2 tablets by mouth twice daily.) 90 tablet    ? venlafaxine XR (EFFEXOR XR) 150 mg capsule TAKE 1 CAPSULE BY MOUTH ONCE DAILY WITH  BREAKFAST 90 capsule 0   ? vitamins, multi w/minerals 9 mg iron-400 mcg tab Take 1 tablet by mouth daily.         Review of Systems:  All other systems reviewed and are negative. + fatigue. No numbness, tingling. + RLS. + RUE swelling and pain. + tingling in her R wrist. No tremors. No vision changes. No speech changes.     Vital Signs:  Last Filed in 24 hours Vital Signs:  24 hour Range    BP: 114/84 (08/02 1500)  Temp: 36.9 ?C (98.5 ?F) (08/02 1111)  Pulse: 84 (08/02 1225)  SpO2: 93 % (08/02 1700)  O2 Device: None (Room air) (08/02 1225)  SpO2 Pulse: 85 (08/02 1700)  Height: 170.2 cm (5' 7) (08/02 1111) BP: (114-134)/(56-84)   Temp:  [36.9 ?C (98.5 ?F)]   Pulse:  [83-89]   SpO2:  [93 %-97 %] O2 Device: None (  Room air)       General physical exam:    HEENT: normocephalic, eyes open with no discharge, nares patent, oropharynx is clear with no lesions, palate intact  CV: regular rate and rhythm  Chest: normal configuration  Ab: soft, non-distended   Skin: no rashes or lesions. R wrist swollen and erythematous     Neuro exam:   Mental status: Wakes to voice, drowsy, falls asleep during interview, and oriented to person, place, time and situation    Speech:    Normal Abnormal   Fluency x    Comprehension x    Articulation x    Repetition     Naming x        Cranial Nerves:    Normal Abnormal   II Pupils equal (2mm), round, reactive. Fundus unremarkable    III, IV, VI EOMI, no nystagmus    V Intact to LT in V1-3    VII Face symmetrical, eye closure symmetrical Mild R ptosis- keeps R eye closed at times   VIII Hearing intact to finger rub    IX, X Uvula midline and palate elevation symmetrical    XI Shrug equal     XII Tongue midline        Muscle/motor:   Tone: nml  Bulk: nml       Right  Left   Right  Left    Shoulder abductors:  5  5 Hip flexors:  5  5   Elbow flexors:  5  5 Hip abductors:      Elbow extensors:  4-5* pain  5 Hip extensors:      Wrist extensors:    Hip adductors:      Wrist flexors:    Knee flexors:  5  5   Finger flexors:  3-4* pain  5 Knee extensors:  5  5   Finger extensors:    Ankle plantar flexors:  5  5   Finger abductors:    Ankle dorsiflexors:  5  5   Thumb abductors:    Ankle inversion:         Ankle eversion:         Toe extensors:         Toe flexors:      No abnormal movements, rigidity or spasticity    Reflexes:      Right Left   Triceps 2 2   Biceps 2 2   Brachioradialis 2 2   Patella 1 1   Ankle 0 0   Plantar downgoing downgoing   Other reflexes:  Hoffman: absent    Sensation:    Normal RUE LUE RLE LLE   Light Touch x       Pin Prick        Temperature x       Vibration        Proprioception            Coordination:    Normal Abnormal Right Abnormal Left   Finger to Nose Rapid alternating       Heel to Shin      Finger tap x     Foot tap      Other        Gait and Station:  deferred          Lab/Radiology/Other Diagnostic Tests:  24-hour labs:    Results for orders placed or performed during the hospital encounter of 07/24/21 (from the past 24 hour(s))   POC GLUCOSE  Collection Time: 07/24/21  2:52 PM   Result Value Ref Range    Glucose, POC 179 (H) 70 - 100 MG/DL   CBC AND DIFF    Collection Time: 07/24/21  2:58 PM   Result Value Ref Range    White Blood Cells 5.9 4.5 - 11.0 K/UL    RBC 4.12 4.0 - 5.0 M/UL    Hemoglobin 11.5 (L) 12.0 - 15.0 GM/DL    Hematocrit 60.7 (L) 36 - 45 %    MCV 85.2 80 - 100 FL    MCH 27.9 26 - 34 PG    MCHC 32.8 32.0 - 36.0 G/DL    RDW 37.1 11 - 15 %    Platelet Count 205 150 - 400 K/UL    MPV 8.4 7 - 11 FL    Neutrophils 53 41 - 77 %    Lymphocytes 29 24 - 44 %    Monocytes 11 4 - 12 %    Eosinophils 5 0 - 5 %    Basophils 2 0 - 2 %    Absolute Neutrophil Count 3.10 1.8 - 7.0 K/UL    Absolute Lymph Count 1.73 1.0 - 4.8 K/UL    Absolute Monocyte Count 0.64 0 - 0.80 K/UL    Absolute Eosinophil Count 0.31 0 - 0.45 K/UL    Absolute Basophil Count 0.10 0 - 0.20 K/UL    MDW (Monocyte Distribution Width) 17.7 <20.7   COMPREHENSIVE METABOLIC PANEL    Collection Time: 07/24/21  2:58 PM   Result Value Ref Range    Sodium 136 (L) 137 - 147 MMOL/L    Potassium 3.9 3.5 - 5.1 MMOL/L    Chloride 101 98 - 110 MMOL/L    Glucose 166 (H) 70 - 100 MG/DL    Blood Urea Nitrogen 21 7 - 25 MG/DL    Creatinine 0.62 0.4 - 1.00 MG/DL    Calcium 8.8 8.5 - 69.4 MG/DL    Total Protein 6.3 6.0 - 8.0 G/DL    Total Bilirubin 0.4 0.3 - 1.2 MG/DL    Albumin 3.6 3.5 - 5.0 G/DL    Alk Phosphatase 854 25 - 110 U/L    AST (SGOT) 21 7 - 40 U/L    CO2 27 21 - 30 MMOL/L    ALT (SGPT) 22 7 - 56 U/L    Anion Gap 8 3 - 12    eGFR >60 >60 mL/min   URINALYSIS DIPSTICK REFLEX TO CULTURE    Collection Time: 07/24/21  2:58 PM    Specimen: Urine   Result Value Ref Range    Color,UA YELLOW Turbidity,UA CLEAR CLEAR-CLEAR    Specific Gravity-Urine 1.021 1.005 - 1.030    pH,UA 5.0 5.0 - 8.0    Protein,UA NEG NEG-NEG    Glucose,UA 1+ (A) NEG-NEG    Ketones,UA NEG NEG-NEG    Bilirubin,UA NEG NEG-NEG    Blood,UA NEG NEG-NEG    Urobilinogen,UA NORMAL NORM-NORMAL    Nitrite,UA NEG NEG-NEG    Leukocytes,UA TRACE (A) NEG-NEG    Urine Ascorbic Acid, UA NEG NEG-NEG   URINALYSIS MICROSCOPIC REFLEX TO CULTURE    Collection Time: 07/24/21  2:58 PM    Specimen: Urine   Result Value Ref Range    WBCs,UA 0-2 0 - 2 /HPF    RBCs,UA 0-2 0 - 3 /HPF    Comment,UA       Criteria for reflex to culture are WBC>10, Positive Nitrite, and/or >=+1   leukocytes. If quantity  is not sufficient, an addendum will follow.      MucousUA TRACE    MAGNESIUM    Collection Time: 07/24/21  2:58 PM   Result Value Ref Range    Magnesium 1.7 1.6 - 2.6 mg/dL   VITAMIN Z61    Collection Time: 07/24/21  2:58 PM   Result Value Ref Range    Vitamin B12 415 180 - 914 PG/ML   TSH WITH FREE T4 REFLEX    Collection Time: 07/24/21  2:58 PM   Result Value Ref Range    TSH 1.70 0.35 - 5.00 MCU/ML   POC BLOOD GAS VEN    Collection Time: 07/24/21  7:45 PM   Result Value Ref Range    PH-VEN-POC 7.38 7.30 - 7.40    PCO2-VEN-POC 44 36 - 50 MMHG    PO2-VEN-POC 31 (L) 33 - 48 MMHG    Base Ex-VEN-POC 0.0 MMOL/L    O2 Sat-VEN-POC 58.0 55 - 71 %    Bicarbonate-VEN-POC 25.5 MMOL/L         Neurology Swing Consult Service:  > I can be contacted for patient questions on Voalte between 10AM and 11PM.    > Urgent questions (prior to 10AM) can otherwise be directed to the Neurology teaching service at pager [phone].     > Calls to the Neurology swing pager [phone]), will not be returned prior to 12PM     Alden Hipp, DO  Clinical Associate Professor  Department of Neurology

## 2021-07-25 NOTE — ED Notes
Pt A&O X4. Discharge & follow up instructions read to pt a loud. Pt verbalizes understand with no further questions or complaints. VSS. Pt wheeled out of ED to go home by POV.

## 2021-07-26 ENCOUNTER — Encounter: Admit: 2021-07-26 | Discharge: 2021-07-26 | Payer: MEDICARE

## 2021-07-26 DIAGNOSIS — M069 Rheumatoid arthritis, unspecified: Secondary | ICD-10-CM

## 2021-07-26 NOTE — Progress Notes
Called patient's daughter to discuss Orencia and rituximab. Per daughter, it is not a good time. Patient's sister just passed away. Patient's daughter plans to call once things settle down. Provided pharmacist phone number. [phone]. Will await call back.

## 2021-07-26 NOTE — Telephone Encounter
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  Dr. Tamala Julian wanted me to touch base with you regarding discussing with the patient and her daughter 2 options: Orencia or rituximab.  In the meantime we will keep her on the current regimen.  She has a follow-up scheduled with me in 2 months.  Thank you.

## 2021-07-27 ENCOUNTER — Encounter: Admit: 2021-07-27 | Discharge: 2021-07-27 | Payer: MEDICARE

## 2021-07-27 NOTE — Progress Notes
From: Brennen Bittel <>  Sent: Tuesday, July 24, 2021 9:13:15 PM  To: Yolonda Kida <>  Subject: Hi! Can you assist with an ASAP Neuro sleep clinic appt?        Hailey Rodgers  Female, 70 y.o., 1951-11-06  MRN:   [id number]    Referral placed.   Thanks!!    Brennen Bittel, DO   ___________________________________________    On 07/25/21, referral was "return to sender" as provider not currently taking NPs.    Today, RN changed referral to Pulm and [facility] Coordinator for assistance on urgent matter. If Pulm will not be able to service pt, RN will refer pt to external provider:

## 2021-07-27 NOTE — Progress Notes
Update:         Provider messaged of update.

## 2021-07-29 ENCOUNTER — Encounter: Admit: 2021-07-29 | Discharge: 2021-07-29 | Payer: MEDICARE

## 2021-07-29 MED ORDER — METHOTREXATE SODIUM 2.5 MG PO TAB
ORAL_TABLET | 0 refills
Start: 2021-07-29 — End: ?

## 2021-07-30 ENCOUNTER — Ambulatory Visit: Admit: 2021-07-30 | Discharge: 2021-07-30 | Payer: MEDICARE

## 2021-07-30 ENCOUNTER — Encounter: Admit: 2021-07-30 | Discharge: 2021-07-30 | Payer: MEDICARE

## 2021-07-30 DIAGNOSIS — J309 Allergic rhinitis, unspecified: Secondary | ICD-10-CM

## 2021-07-30 DIAGNOSIS — M4802 Spinal stenosis, cervical region: Secondary | ICD-10-CM

## 2021-07-30 DIAGNOSIS — J40 Bronchitis, not specified as acute or chronic: Secondary | ICD-10-CM

## 2021-07-30 DIAGNOSIS — G4733 Obstructive sleep apnea (adult) (pediatric): Secondary | ICD-10-CM

## 2021-07-30 DIAGNOSIS — R3989 Other symptoms and signs involving the genitourinary system: Secondary | ICD-10-CM

## 2021-07-30 DIAGNOSIS — Z72820 Sleep deprivation: Secondary | ICD-10-CM

## 2021-07-30 DIAGNOSIS — K219 Gastro-esophageal reflux disease without esophagitis: Secondary | ICD-10-CM

## 2021-07-30 DIAGNOSIS — E669 Obesity, unspecified: Secondary | ICD-10-CM

## 2021-07-30 DIAGNOSIS — R54 Age-related physical debility: Secondary | ICD-10-CM

## 2021-07-30 DIAGNOSIS — R413 Other amnesia: Secondary | ICD-10-CM

## 2021-07-30 DIAGNOSIS — F3341 Major depressive disorder, recurrent, in partial remission: Secondary | ICD-10-CM

## 2021-07-30 DIAGNOSIS — G2581 Restless legs syndrome: Secondary | ICD-10-CM

## 2021-07-30 DIAGNOSIS — R29898 Other symptoms and signs involving the musculoskeletal system: Secondary | ICD-10-CM

## 2021-07-30 DIAGNOSIS — M069 Rheumatoid arthritis, unspecified: Secondary | ICD-10-CM

## 2021-07-30 DIAGNOSIS — E7849 Other hyperlipidemia: Secondary | ICD-10-CM

## 2021-07-30 DIAGNOSIS — K760 Fatty (change of) liver, not elsewhere classified: Secondary | ICD-10-CM

## 2021-07-30 DIAGNOSIS — K5790 Diverticulosis of intestine, part unspecified, without perforation or abscess without bleeding: Secondary | ICD-10-CM

## 2021-07-30 DIAGNOSIS — E785 Hyperlipidemia, unspecified: Secondary | ICD-10-CM

## 2021-07-30 DIAGNOSIS — E034 Atrophy of thyroid (acquired): Secondary | ICD-10-CM

## 2021-07-30 DIAGNOSIS — E039 Hypothyroidism, unspecified: Secondary | ICD-10-CM

## 2021-07-30 DIAGNOSIS — R911 Solitary pulmonary nodule: Secondary | ICD-10-CM

## 2021-07-30 DIAGNOSIS — G629 Polyneuropathy, unspecified: Secondary | ICD-10-CM

## 2021-07-30 DIAGNOSIS — E1165 Type 2 diabetes mellitus with hyperglycemia: Secondary | ICD-10-CM

## 2021-07-30 DIAGNOSIS — R4189 Other symptoms and signs involving cognitive functions and awareness: Secondary | ICD-10-CM

## 2021-07-30 DIAGNOSIS — E119 Type 2 diabetes mellitus without complications: Secondary | ICD-10-CM

## 2021-07-30 DIAGNOSIS — G47419 Narcolepsy without cataplexy: Secondary | ICD-10-CM

## 2021-07-30 DIAGNOSIS — R569 Unspecified convulsions: Secondary | ICD-10-CM

## 2021-07-30 DIAGNOSIS — M48061 Spinal stenosis, lumbar region without neurogenic claudication: Secondary | ICD-10-CM

## 2021-07-30 DIAGNOSIS — Z79899 Other long term (current) drug therapy: Secondary | ICD-10-CM

## 2021-07-30 DIAGNOSIS — R32 Unspecified urinary incontinence: Secondary | ICD-10-CM

## 2021-07-30 DIAGNOSIS — J45909 Unspecified asthma, uncomplicated: Secondary | ICD-10-CM

## 2021-07-30 DIAGNOSIS — G479 Sleep disorder, unspecified: Secondary | ICD-10-CM

## 2021-07-30 DIAGNOSIS — M1990 Unspecified osteoarthritis, unspecified site: Secondary | ICD-10-CM

## 2021-07-30 DIAGNOSIS — U071 COVID-19 virus infection: Secondary | ICD-10-CM

## 2021-07-30 DIAGNOSIS — H547 Unspecified visual loss: Secondary | ICD-10-CM

## 2021-07-30 LAB — IRON + BINDING CAPACITY + %SAT+ FERRITIN
% SATURATION: 17 % — ABNORMAL LOW (ref 28–42)
FERRITIN: 40 ng/mL — ABNORMAL HIGH (ref 10–200)
IRON BINDING: 511 ug/dL — ABNORMAL HIGH (ref 270–380)
IRON: 86 ug/dL (ref 50–160)

## 2021-07-30 MED ORDER — PREGABALIN 75 MG PO CAP
75 mg | ORAL_CAPSULE | Freq: Three times a day (TID) | ORAL | 3 refills | Status: DC
Start: 2021-07-30 — End: 2021-07-30

## 2021-07-30 MED ORDER — PRAMIPEXOLE 0.75 MG PO TAB
.75 mg | ORAL_TABLET | Freq: Two times a day (BID) | ORAL | 3 refills | 45.00000 days | Status: AC
Start: 2021-07-30 — End: ?

## 2021-07-30 MED ORDER — PREGABALIN 75 MG PO CAP
75 mg | ORAL_CAPSULE | Freq: Three times a day (TID) | ORAL | 3 refills | Status: AC
Start: 2021-07-30 — End: ?

## 2021-07-30 NOTE — Progress Notes
Date of Service: 07/30/2021    Subjective:             Hailey Rodgers is a 70 y.o. female.    History of Present Illness  HISTORY OF PRESENT ILLNESS: She has history of sleep apnea on BiPAP for almost 30-35 years.  Patient present to clinic for management of sleep apnea and RLS. She recently went to ER for evaluation of insomnia. 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 The patient goes to bed about 12:00 AM -4:00AM fall asleep fast. She wakes up multiple time due to nocturia and wakes up around 7:00 AM. She feels tired upon awakening. she reports snoring. Often times gets tired while watching TV, reading, and in low stimulus environments.  Reports sleepiness while talking, eating, or driving.  Morning headaches absent. She denies any hypnagogic or hypnopompic hallucinations.  No cataplexy symptoms and no sleep paralysis are noted.  she estimates a 26 pound weight gain over the last 1 years. She takes multiple day time naps. She reports legs cramps and urge to move legs.      DIAGNOSTIC STUDIES:  The patient completed the patient health questionnaire. The Epworth Sleepiness Scale (ESS) was 19.     Review of Systems   Constitutional: Positive for fatigue. Negative for appetite change and unexpected weight change.   Respiratory: Positive for apnea and shortness of breath. Negative for cough.    Cardiovascular: Negative for chest pain.   Gastrointestinal: Negative.    Neurological: Negative for dizziness.   Psychiatric/Behavioral: Positive for sleep disturbance.   All other systems reviewed and are negative.        Objective:         ? acetaminophen (TYLENOL EXTRA STRENGTH) 500 mg tablet Take two tablets by mouth every 6 hours as needed. Max of 4,000 mg of acetaminophen in 24 hours.  Indications: pain   ? aspirin 81 mg chewable tablet Chew 81 mg by mouth at bedtime daily.   ? blood-glucose sensor (DEXCOM G6 SENSOR MISC) Use  as directed.   ? calcium carbonate/vitamin D-3 (OSCAL-500+D) 1250 mg/200 unit tablet Take one tablet by mouth twice daily. Calcium Carb 1250mg  delivers 500mg  elemental Ca   ? celecoxib (CELEBREX) 100 mg capsule Take 100 mg by mouth twice daily.   ? DIPH/LIDO/ANTACID 1:1:1 (COMPOUND) Swish and Spit 5 mL by mouth as directed every 4 hours as needed.   ? dulaglutide (TRULICITY) 1.5 mg/0.5 mL injection pen Inject 1.5 mg under the skin every Wednesday.   ? ergocalciferol (VITAMIN D-2) 50,000 unit capsule Take 50,000 Units by mouth every 7 days.   ? etanercept (ENBREL SURECLICK) 50 mg/mL (1 mL) injection pen Inject 1 mL under the skin every 7 days.   ? ferrous sulfate (FEOSOL) 325 mg (65 mg iron) tablet Take 325 mg by mouth twice daily. Take on an empty stomach at least 1 hour before or 2 hours after food.   ? fexofenadine(+) (ALLEGRA) 180 mg tablet Take 180 mg by mouth daily.   ? fluocinolone acetonide oil (DERMOTIC OIL) 0.01 % otic drops INSTILL 3 DROPS INTO NOSE DAILY AS DIRECTED   ? folic acid (FOLVITE) 1 mg tablet Take four tablets by mouth daily.   ? furosemide (LASIX) 40 mg tablet Take 40 mg by mouth daily as needed.   ? insulin aspart U-100 (NOVOLOG U-100 INSULIN ASPART) 100 unit/mL injection Inject twelve Units  under the skin three times daily with meals. Take 12 units with breakfast, 22 units with lunch, and 30 units with dinner.  Indications: type 2 diabetes mellitus (Patient taking differently: Inject 25 Units under the skin three times daily before meals. Plus sliding scale: if BG 150-159 = 1 unit, 160-169 = 2 units  Indications: type 2 diabetes mellitus)   ? insulin degludec (TRESIBA FLEXTOUCH) 200 unit/mL (3 mL) injection PEN Inject eighty Units under the skin at bedtime daily. (Patient taking differently: Inject 100 Units under the skin at bedtime daily.)   ? Insulin Needles (Disposable) (NANO PEN NEEDLE) 32 gauge x 5/32 ndle Use 1 Each as directed before meals and at bedtime. ? lactobacillus rhamnosus GG (CULTURELLE) 15 billion cell capsule Take 1 capsule by mouth as directed daily.   ? levothyroxine (SYNTHROID) 88 mcg tablet Take 88 mcg by mouth daily.   ? lidocaine (ASPERCREME PATCH) 4 % topical patch Apply 1 patch topically to affected area daily.   ? methotrexate sodium 2.5 mg tablet Take eight tablets by mouth every 7 days. (Patient taking differently: Take 20 mg by mouth every Friday.)   ? mirabegron (MYRBETRIQ) 50 mg ER tablet Take 50 mg by mouth daily.   ? nortriptyline (PAMELOR) 25 mg capsule Take 25 mg by mouth at bedtime daily.   ? NYSTOP 100,000 unit/gram topical powder Apply to clean, dry skin 2-3 times daily for yeast infection   ? oxyCODONE/acetaminophen (PERCOCET) 5/325 mg tablet Take one tablet to two tablets by mouth every 6 hours as needed   ? pantoprazole DR (PROTONIX) 40 mg tablet Take 40 mg by mouth twice daily.   ? pioglitazone (ACTOS) 15 mg tablet Take 45 mg by mouth daily.   ? polyethylene glycol 3350 (MIRALAX) 17 g packet Take one packet by mouth twice daily as needed.   ? potassium chloride (KLOR-CON 10) 10 mEq tablet Take 2 tablets by mouth every day as needed if taking furosemide for edema.   ? pramipexole (MIRAPEX) 0.75 mg tablet Take one tablet by mouth twice daily. Take one tablet at 2pm and one at bedtime.   ? pregabalin (LYRICA) 75 mg capsule Take one capsule by mouth three times daily. Indications: restless legs syndrome, an extreme discomfort in the calf muscles when sitting or lying down   ? rosuvastatin (CRESTOR) 20 mg tablet Take 1 tablet by mouth daily.   ? senna/docusate (SENOKOT-S) 8.6/50 mg tablet Take one tablet by mouth twice daily. (Patient taking differently: Take 2 tablets by mouth twice daily.)   ? venlafaxine XR (EFFEXOR XR) 150 mg capsule TAKE 1 CAPSULE BY MOUTH ONCE DAILY WITH  BREAKFAST   ? vitamins, multi w/minerals 9 mg iron-400 mcg tab Take 1 tablet by mouth daily.     Vitals:    07/30/21 0951   BP: 115/72   Pulse: 76   Temp: 36.3 ?C (97.3 ?F)   SpO2: 94%   TempSrc: Skin   Weight: 102.5 kg (226 lb)   Height: 170.2 cm (5' 7)  Comment: per pt     Body mass index is 35.4 kg/m?[name]     Physical Exam  Vital Signs:    Reviewed  General:        Alert and oriented, comfortable, not in acute distress  Head:            Atraumatic, normocephalic  Mouth:           [place]  Level 4   , Overbite/Overjet, Palate-   Eyes:  EOMI, PEARLA  CVS:             S1 S2 normal, regular rate and rhythm  RS:               Clear to auscultate bilaterally  Skin:             Warm and Dry  Extremities:   No edema  Psychiatry: Alert and oriented, mood normal             Assessment and Plan:    Problem   Poor Sleep   Obstructive Sleep Apnea    Last Assessment & Plan:   Formatting of this note might be different from the original.  Patient uses the CPAP at home.    DME: Company: other than [facility]     Restless Legs Syndrome (Rls)        Obstructive sleep apnea  We will order a new CPAP machine. We will set it up with pressure of 10cmH20. The study shows pressure of 10cm H20 is effective we will order CPAP pressure of 10 cmH20.    The patient continues to benefit from CPAP with more consolidated sleep, elimination of snoring and improved daytime alertness.    Restless legs syndrome (RLS)  Patient RLS Symptoms not controlled. She has symptoms both day time and night time.  She noticed no improvement on Mirapex. She is taking Mirapex for longer duration 10 + years. At this stage she may have RLS augmentation. We discuss the long term plan is get Mirapex off and try alternative. Recommend to try gabapentin or Lyrica. We will check Iron today. If still iron low, we will order iron infusion to help with symptoms.     She is on Mirapex 0.75 mg bid. Which was recently decreased due to confusion. We will continue the same dose for now. Recommend to take first dose first dose around 2:00 pm second dose at bedtime. We will add Lyrica 75 mg at evening.      Patient is on Effexor which can increase RLS symptoms. We recommend to discuss with psychiatry for alternative as she cannot take Wellbutrin due to risk of seizures.    A serum ferritin will be obtained as a low serum ferritin may be associated with restless legs syndrome. Treatment with iron may result in improvement in symptoms. Goal ferritin level is greater than 75 ng/mL.                           Garlon Hatchet, MD, FACP  Sleep Medicine Fellow

## 2021-07-31 ENCOUNTER — Encounter: Admit: 2021-07-31 | Discharge: 2021-07-31 | Payer: MEDICARE

## 2021-07-31 DIAGNOSIS — G4733 Obstructive sleep apnea (adult) (pediatric): Secondary | ICD-10-CM

## 2021-07-31 NOTE — Telephone Encounter
Order for cPAP at 10cm H2O placed as discussed per office note on 07/30/21    DME: [facility]   Will continue to follow up on order.

## 2021-08-02 ENCOUNTER — Encounter: Admit: 2021-08-02 | Discharge: 2021-08-02 | Payer: MEDICARE

## 2021-08-02 MED ORDER — FOLIC ACID 1 MG PO TAB
4 mg | ORAL_TABLET | Freq: Every day | ORAL | 0 refills
Start: 2021-08-02 — End: ?

## 2021-08-03 ENCOUNTER — Encounter: Admit: 2021-08-03 | Discharge: 2021-08-03 | Payer: MEDICARE

## 2021-08-03 ENCOUNTER — Emergency Department: Admit: 2021-08-03 | Discharge: 2021-08-03 | Payer: MEDICARE

## 2021-08-03 DIAGNOSIS — M4802 Spinal stenosis, cervical region: Secondary | ICD-10-CM

## 2021-08-03 DIAGNOSIS — R4189 Other symptoms and signs involving cognitive functions and awareness: Secondary | ICD-10-CM

## 2021-08-03 DIAGNOSIS — U071 COVID-19 virus infection: Secondary | ICD-10-CM

## 2021-08-03 DIAGNOSIS — R29898 Other symptoms and signs involving the musculoskeletal system: Secondary | ICD-10-CM

## 2021-08-03 DIAGNOSIS — E1165 Type 2 diabetes mellitus with hyperglycemia: Secondary | ICD-10-CM

## 2021-08-03 DIAGNOSIS — K5790 Diverticulosis of intestine, part unspecified, without perforation or abscess without bleeding: Secondary | ICD-10-CM

## 2021-08-03 DIAGNOSIS — R3989 Other symptoms and signs involving the genitourinary system: Secondary | ICD-10-CM

## 2021-08-03 DIAGNOSIS — G47419 Narcolepsy without cataplexy: Secondary | ICD-10-CM

## 2021-08-03 DIAGNOSIS — M069 Rheumatoid arthritis, unspecified: Secondary | ICD-10-CM

## 2021-08-03 DIAGNOSIS — E669 Obesity, unspecified: Secondary | ICD-10-CM

## 2021-08-03 DIAGNOSIS — Z79899 Other long term (current) drug therapy: Secondary | ICD-10-CM

## 2021-08-03 DIAGNOSIS — E039 Hypothyroidism, unspecified: Secondary | ICD-10-CM

## 2021-08-03 DIAGNOSIS — J309 Allergic rhinitis, unspecified: Secondary | ICD-10-CM

## 2021-08-03 DIAGNOSIS — R54 Age-related physical debility: Secondary | ICD-10-CM

## 2021-08-03 DIAGNOSIS — H547 Unspecified visual loss: Secondary | ICD-10-CM

## 2021-08-03 DIAGNOSIS — R413 Other amnesia: Secondary | ICD-10-CM

## 2021-08-03 DIAGNOSIS — R569 Unspecified convulsions: Secondary | ICD-10-CM

## 2021-08-03 DIAGNOSIS — E7849 Other hyperlipidemia: Secondary | ICD-10-CM

## 2021-08-03 DIAGNOSIS — J45909 Unspecified asthma, uncomplicated: Secondary | ICD-10-CM

## 2021-08-03 DIAGNOSIS — E119 Type 2 diabetes mellitus without complications: Secondary | ICD-10-CM

## 2021-08-03 DIAGNOSIS — R32 Unspecified urinary incontinence: Secondary | ICD-10-CM

## 2021-08-03 DIAGNOSIS — K219 Gastro-esophageal reflux disease without esophagitis: Secondary | ICD-10-CM

## 2021-08-03 DIAGNOSIS — E785 Hyperlipidemia, unspecified: Secondary | ICD-10-CM

## 2021-08-03 DIAGNOSIS — R911 Solitary pulmonary nodule: Secondary | ICD-10-CM

## 2021-08-03 DIAGNOSIS — G479 Sleep disorder, unspecified: Secondary | ICD-10-CM

## 2021-08-03 DIAGNOSIS — J40 Bronchitis, not specified as acute or chronic: Secondary | ICD-10-CM

## 2021-08-03 DIAGNOSIS — M48061 Spinal stenosis, lumbar region without neurogenic claudication: Secondary | ICD-10-CM

## 2021-08-03 DIAGNOSIS — G4733 Obstructive sleep apnea (adult) (pediatric): Secondary | ICD-10-CM

## 2021-08-03 DIAGNOSIS — G629 Polyneuropathy, unspecified: Secondary | ICD-10-CM

## 2021-08-03 DIAGNOSIS — E034 Atrophy of thyroid (acquired): Secondary | ICD-10-CM

## 2021-08-03 DIAGNOSIS — K760 Fatty (change of) liver, not elsewhere classified: Secondary | ICD-10-CM

## 2021-08-03 DIAGNOSIS — M1990 Unspecified osteoarthritis, unspecified site: Secondary | ICD-10-CM

## 2021-08-03 DIAGNOSIS — F3341 Major depressive disorder, recurrent, in partial remission: Secondary | ICD-10-CM

## 2021-08-03 LAB — CBC AND DIFF
ABSOLUTE BASO COUNT: 0 K/UL (ref 0–0.20)
ABSOLUTE EOS COUNT: 0.2 K/UL (ref 0–0.45)
ABSOLUTE LYMPH COUNT: 1.7 K/UL (ref 1.0–4.8)
ABSOLUTE MONO COUNT: 0.5 K/UL (ref 0–0.80)
ABSOLUTE NEUTROPHIL: 2.3 K/UL (ref 1.8–7.0)
BASOPHILS %: 1 % (ref 0–2)
EOSINOPHILS %: 6 % — ABNORMAL HIGH (ref >60)
HEMATOCRIT: 35 % — ABNORMAL LOW (ref 36–45)
HEMOGLOBIN: 11 g/dL — ABNORMAL LOW (ref 12.0–15.0)
LYMPHOCYTES %: 35 % (ref 24–44)
MCH: 28 pg (ref 26–34)
MCHC: 33 g/dL (ref 32.0–36.0)
MCV: 85 FL (ref 80–100)
MDW (MONOCYTE DISTRIBUTION WIDTH): 17 (ref <20.7)
MONOCYTES %: 11 % (ref 4–12)
MPV: 8.1 FL (ref 7–11)
NEUTROPHILS %: 47 % (ref 41–77)
PLATELET COUNT: 248 K/UL (ref 150–400)
RBC COUNT: 4.1 M/UL — ABNORMAL HIGH (ref 4.0–5.0)
RDW: 14 % (ref 11–15)
WBC COUNT: 5 K/UL (ref 4.5–11.0)

## 2021-08-03 LAB — POC TROPONIN: TROPONIN I, POC: 0 ng/mL (ref 0.00–0.05)

## 2021-08-03 LAB — BNP POC ER: BNP POC: <15 pg/mL (ref 0–100)

## 2021-08-03 LAB — CREATINE KINASE-CPK: CK TOTAL: 164 U/L (ref 21–215)

## 2021-08-03 LAB — C REACTIVE PROTEIN (CRP): C-REACTIVE PROTEIN: 0.2 mg/dL (ref <1.0)

## 2021-08-03 LAB — COMPREHENSIVE METABOLIC PANEL
POTASSIUM: 3.9 MMOL/L (ref 3.5–5.1)
SODIUM: 138 MMOL/L (ref 137–147)

## 2021-08-03 LAB — SED RATE: ESR: 36 mm/h — ABNORMAL HIGH (ref 0–30)

## 2021-08-03 MED ORDER — TRESIBA FLEXTOUCH U-200 200 UNIT/ML (3 ML) SC INPN
100 [IU] | Freq: Every day | SUBCUTANEOUS | 2 refills | 60.00000 days | Status: AC
Start: 2021-08-03 — End: ?
  Filled 2021-08-05: qty 9, 18d supply, fill #1

## 2021-08-03 MED ORDER — INSULIN ASPART 100 UNIT/ML SC FLEXPEN
SUBCUTANEOUS | 4 refills | Status: CN
Start: 2021-08-03 — End: ?

## 2021-08-03 MED ORDER — FOLIC ACID 1 MG PO TAB
4 mg | ORAL_TABLET | Freq: Every day | ORAL | 3 refills | Status: AC
Start: 2021-08-03 — End: ?
  Filled 2021-08-05: qty 120, 30d supply, fill #1

## 2021-08-03 MED ORDER — MORPHINE 4 MG/ML IV SYRG
4 mg | Freq: Once | INTRAVENOUS | 0 refills | Status: CP
Start: 2021-08-03 — End: ?
  Administered 2021-08-04: 05:00:00 4 mg via INTRAVENOUS

## 2021-08-03 MED ORDER — FENTANYL CITRATE (PF) 50 MCG/ML IJ SOLN
50 ug | INTRAVENOUS | 0 refills | Status: AC | PRN
Start: 2021-08-03 — End: ?

## 2021-08-03 MED ORDER — PEN NEEDLE, DIABETIC 32 GAUGE X 5/32" MISC NDLE
1 | Freq: Before meals | 1 refills
Start: 2021-08-03 — End: ?

## 2021-08-03 NOTE — Telephone Encounter
Called and spoke with patients daughter Hailey Rodgers, a Marine scientist. She reports the swelling has persisted for 3 to 4 days. There is significant swelling in the right foot, lower leg, calf and thigh. Patient also has plus 2 pitting Edema in the right lower leg and plus 1 pitting edema in the left lower leg. The patient reports significant pain in the back of the calf and says it is painful to walk but denies being immobile. Daughter says that she has done much walking as her husband is in the hospital inpatient at [facility].     Asked Hailey Rodgers to send photo's of the leg to add to diagnosis and Routing to Dr. Hilbert Odor for review and recommendations.

## 2021-08-03 NOTE — Telephone Encounter
Spoke with patients and daughter again via phone and provided recommendation per Dr. Hilbert Odor.  She says they will head to the ED now and plan to hold medications until infection is ruled out.     She also requested a refill of Folic Acid to [facility]. Reports the dose was increase to '4mg'$  daily but they have not received a new script.     Folic Acid not listed in the plan to continue on 07/24/21. Routing to Dr. Hilbert Odor for approval.

## 2021-08-04 ENCOUNTER — Encounter: Admit: 2021-08-04 | Discharge: 2021-08-04 | Payer: MEDICARE

## 2021-08-04 ENCOUNTER — Observation Stay: Admit: 2021-08-04 | Discharge: 2021-08-04 | Payer: MEDICARE

## 2021-08-04 ENCOUNTER — Emergency Department: Admit: 2021-08-04 | Discharge: 2021-08-04 | Payer: MEDICARE

## 2021-08-04 MED ADMIN — SODIUM CHLORIDE 0.9 % IV SOLP [27838]: 300 mg | INTRAVENOUS | @ 14:00:00 | Stop: 2021-08-07 | NDC 00338004902

## 2021-08-04 MED ADMIN — OXYCODONE-ACETAMINOPHEN 5-325 MG PO TAB [5940]: 2 | ORAL | @ 21:00:00 | NDC 00904709361

## 2021-08-04 MED ADMIN — ACETAMINOPHEN 500 MG PO TAB [102]: 1000 mg | ORAL | @ 07:00:00 | Stop: 2021-08-04 | NDC 00904673080

## 2021-08-04 MED ADMIN — LEVOTHYROXINE 88 MCG PO TAB [10403]: 88 ug | ORAL | @ 14:00:00 | NDC 00904695261

## 2021-08-04 MED ADMIN — LISINOPRIL 10 MG PO TAB [10449]: 10 mg | ORAL | @ 09:00:00 | NDC 00904679861

## 2021-08-04 MED ADMIN — LIDOCAINE 5 % TP PTMD [80759]: 3 | TOPICAL | @ 07:00:00 | Stop: 2021-08-04 | NDC 00591267911

## 2021-08-04 MED ADMIN — PANTOPRAZOLE 40 MG IV SOLR [78621]: 40 mg | INTRAVENOUS | @ 09:00:00 | Stop: 2021-08-04 | NDC 55150020200

## 2021-08-04 MED ADMIN — RP DX TL-201 THALLOUS CHL MCI [210517]: 0.55 | INTRAVENOUS | @ 19:00:00 | Stop: 2021-08-04 | NDC 54029258509

## 2021-08-04 MED ADMIN — NYSTATIN 100,000 UNIT/GRAM TP POWD [39136]: TOPICAL | @ 14:00:00 | NDC 00832046515

## 2021-08-04 MED ADMIN — PERFLUTREN LIPID MICROSPHERES 1.1 MG/ML IV SUSP [79178]: 1 mL | INTRAVENOUS | @ 13:00:00 | Stop: 2021-08-04 | NDC 11994001116

## 2021-08-04 MED ADMIN — FERROUS SULFATE 325 MG (65 MG IRON) PO TAB [3074]: 325 mg | ORAL | @ 18:00:00 | NDC 00904759161

## 2021-08-04 MED ADMIN — RP DX TL-201 THALLOUS CHL MCI [210517]: 2.61 | INTRAVENOUS | @ 17:00:00 | Stop: 2021-08-04 | NDC 54029258509

## 2021-08-04 MED ADMIN — KETOROLAC 15 MG/ML IJ SOLN [22472]: 15 mg | INTRAVENOUS | @ 07:00:00 | Stop: 2021-08-04 | NDC 72611071901

## 2021-08-04 MED ADMIN — VENLAFAXINE 150 MG PO CP24 [78649]: 150 mg | ORAL | @ 18:00:00 | NDC 00904647061

## 2021-08-04 MED ADMIN — PRAMIPEXOLE 0.25 MG PO TAB [81284]: 0.75 mg | ORAL | @ 18:00:00 | NDC 00904670461

## 2021-08-04 MED ADMIN — METHOTREXATE SODIUM 2.5 MG PO TAB [4973]: 20 mg | ORAL | @ 22:00:00 | NDC 00904714110

## 2021-08-04 MED ADMIN — ENOXAPARIN 40 MG/0.4 ML SC SYRG [85052]: 40 mg | SUBCUTANEOUS | @ 18:00:00 | NDC 00781324602

## 2021-08-04 MED ADMIN — SODIUM CHLORIDE 0.9 % IJ SOLN [7319]: 50 mL | INTRAVENOUS | @ 06:00:00 | Stop: 2021-08-04 | NDC 00409488820

## 2021-08-04 MED ADMIN — IRON SUCROSE 100 MG IRON/5 ML IV SOLN [80854]: 300 mg | INTRAVENOUS | @ 14:00:00 | Stop: 2021-08-07 | NDC 00517234001

## 2021-08-04 MED ADMIN — IOHEXOL 350 MG IODINE/ML IV SOLN [81210]: 80 mL | INTRAVENOUS | @ 06:00:00 | Stop: 2021-08-04 | NDC 00407141491

## 2021-08-04 MED ADMIN — PANTOPRAZOLE 40 MG IV SOLR [78621]: 40 mg | INTRAVENOUS | @ 18:00:00 | Stop: 2021-08-04 | NDC 55150020200

## 2021-08-04 MED ADMIN — REGADENOSON 0.4 MG/5 ML IV SYRG [168865]: 0.4 mg | INTRAVENOUS | @ 16:00:00 | Stop: 2021-08-04 | NDC 00469650189

## 2021-08-04 MED ADMIN — SENNOSIDES-DOCUSATE SODIUM 8.6-50 MG PO TAB [40926]: 2 | ORAL | @ 18:00:00 | NDC 70000052601

## 2021-08-04 MED ADMIN — MULTIVIT-IRON-FA-CALCIUM-MINS 9 MG IRON-400 MCG PO TAB [172795]: 1 | ORAL | @ 18:00:00 | NDC 00904549261

## 2021-08-04 MED ADMIN — INSULIN GLARGINE 100 UNIT/ML (3 ML) SC INJ PEN [163596]: 50 [IU] | SUBCUTANEOUS | @ 09:00:00 | Stop: 2021-08-04 | NDC 00088221905

## 2021-08-04 MED ADMIN — FOLIC ACID 1 MG PO TAB [3233]: 4 mg | ORAL | @ 18:00:00 | NDC 62584089711

## 2021-08-04 MED ADMIN — MAGNESIUM SULFATE IN D5W 1 GRAM/100 ML IV PGBK [166578]: 1 g | INTRAVENOUS | Stop: 2021-08-05 | NDC 44567041024

## 2021-08-04 MED ADMIN — FUROSEMIDE 40 MG PO TAB [3295]: 40 mg | ORAL | @ 18:00:00 | NDC 51079007301

## 2021-08-04 MED ADMIN — ROSUVASTATIN 20 MG PO TAB [88504]: 20 mg | ORAL | @ 18:00:00 | NDC 68462026390

## 2021-08-05 ENCOUNTER — Encounter: Admit: 2021-08-05 | Discharge: 2021-08-05 | Payer: MEDICARE

## 2021-08-05 MED ADMIN — VENLAFAXINE 150 MG PO CP24 [78649]: 150 mg | ORAL | @ 15:00:00 | Stop: 2021-08-05 | NDC 68084071311

## 2021-08-05 MED ADMIN — LISINOPRIL 10 MG PO TAB [10449]: 10 mg | ORAL | @ 15:00:00 | Stop: 2021-08-05 | NDC 00904679861

## 2021-08-05 MED ADMIN — ASPIRIN 81 MG PO CHEW [680]: 81 mg | ORAL | @ 03:00:00 | NDC 66553000201

## 2021-08-05 MED ADMIN — INSULIN ASPART 100 UNIT/ML SC FLEXPEN [87504]: 16 [IU] | SUBCUTANEOUS | @ 15:00:00 | Stop: 2021-08-05 | NDC 00169633910

## 2021-08-05 MED ADMIN — SENNOSIDES-DOCUSATE SODIUM 8.6-50 MG PO TAB [40926]: 1 | ORAL | @ 03:00:00 | NDC 70000052601

## 2021-08-05 MED ADMIN — ROSUVASTATIN 20 MG PO TAB [88504]: 20 mg | ORAL | @ 15:00:00 | Stop: 2021-08-05 | NDC 68462026390

## 2021-08-05 MED ADMIN — MULTIVIT-IRON-FA-CALCIUM-MINS 9 MG IRON-400 MCG PO TAB [172795]: 1 | ORAL | @ 15:00:00 | Stop: 2021-08-05 | NDC 00904549261

## 2021-08-05 MED ADMIN — OXYCODONE-ACETAMINOPHEN 5-325 MG PO TAB [5940]: 1 | ORAL | @ 15:00:00 | Stop: 2021-08-05 | NDC 00904709361

## 2021-08-05 MED ADMIN — LEVOTHYROXINE 88 MCG PO TAB [10403]: 88 ug | ORAL | @ 10:00:00 | Stop: 2021-08-05 | NDC 00904695261

## 2021-08-05 MED ADMIN — SODIUM CHLORIDE 0.9 % IV SOLP [27838]: 300 mg | INTRAVENOUS | @ 15:00:00 | Stop: 2021-08-05 | NDC 00338004902

## 2021-08-05 MED ADMIN — PREGABALIN 75 MG PO CAP [94904]: 75 mg | ORAL | @ 03:00:00 | NDC 00904700061

## 2021-08-05 MED ADMIN — FUROSEMIDE 40 MG PO TAB [3295]: 40 mg | ORAL | @ 15:00:00 | Stop: 2021-08-05 | NDC 51079007301

## 2021-08-05 MED ADMIN — PREGABALIN 75 MG PO CAP [94904]: 75 mg | ORAL | @ 15:00:00 | Stop: 2021-08-05 | NDC 00904700061

## 2021-08-05 MED ADMIN — FOLIC ACID 1 MG PO TAB [3233]: 4 mg | ORAL | @ 15:00:00 | Stop: 2021-08-05 | NDC 62584089711

## 2021-08-05 MED ADMIN — IRON SUCROSE 100 MG IRON/5 ML IV SOLN [80854]: 300 mg | INTRAVENOUS | @ 15:00:00 | Stop: 2021-08-05 | NDC 00517234001

## 2021-08-05 MED ADMIN — PANTOPRAZOLE 40 MG PO TBEC [80436]: 40 mg | ORAL | @ 03:00:00 | NDC 00904647461

## 2021-08-05 MED ADMIN — PRAMIPEXOLE 0.25 MG PO TAB [81284]: 0.75 mg | ORAL | @ 04:00:00 | NDC 13668009290

## 2021-08-05 MED ADMIN — PANTOPRAZOLE 40 MG PO TBEC [80436]: 40 mg | ORAL | @ 15:00:00 | Stop: 2021-08-05 | NDC 00904647461

## 2021-08-05 MED ADMIN — SENNOSIDES-DOCUSATE SODIUM 8.6-50 MG PO TAB [40926]: 2 | ORAL | @ 15:00:00 | Stop: 2021-08-05 | NDC 00536124801

## 2021-08-05 MED FILL — INSULIN ASPART 100 UNIT/ML SC FLEXPEN: 100 unit/mL (3 mL) | SUBCUTANEOUS | 21 days supply | Qty: 30 | Fill #1 | Status: CP

## 2021-08-06 ENCOUNTER — Encounter: Admit: 2021-08-06 | Discharge: 2021-08-06 | Payer: MEDICARE

## 2021-08-09 ENCOUNTER — Encounter: Admit: 2021-08-09 | Discharge: 2021-08-09 | Payer: MEDICARE

## 2021-08-09 ENCOUNTER — Ambulatory Visit: Admit: 2021-08-09 | Discharge: 2021-08-09 | Payer: MEDICARE

## 2021-08-09 DIAGNOSIS — J45909 Unspecified asthma, uncomplicated: Secondary | ICD-10-CM

## 2021-08-09 DIAGNOSIS — G4733 Obstructive sleep apnea (adult) (pediatric): Secondary | ICD-10-CM

## 2021-08-09 DIAGNOSIS — F3341 Major depressive disorder, recurrent, in partial remission: Secondary | ICD-10-CM

## 2021-08-09 DIAGNOSIS — J40 Bronchitis, not specified as acute or chronic: Secondary | ICD-10-CM

## 2021-08-09 DIAGNOSIS — R911 Solitary pulmonary nodule: Secondary | ICD-10-CM

## 2021-08-09 DIAGNOSIS — R32 Unspecified urinary incontinence: Secondary | ICD-10-CM

## 2021-08-09 DIAGNOSIS — E7849 Other hyperlipidemia: Secondary | ICD-10-CM

## 2021-08-09 DIAGNOSIS — R54 Age-related physical debility: Secondary | ICD-10-CM

## 2021-08-09 DIAGNOSIS — K5790 Diverticulosis of intestine, part unspecified, without perforation or abscess without bleeding: Secondary | ICD-10-CM

## 2021-08-09 DIAGNOSIS — J309 Allergic rhinitis, unspecified: Secondary | ICD-10-CM

## 2021-08-09 DIAGNOSIS — R569 Unspecified convulsions: Secondary | ICD-10-CM

## 2021-08-09 DIAGNOSIS — E039 Hypothyroidism, unspecified: Secondary | ICD-10-CM

## 2021-08-09 DIAGNOSIS — G479 Sleep disorder, unspecified: Secondary | ICD-10-CM

## 2021-08-09 DIAGNOSIS — M1990 Unspecified osteoarthritis, unspecified site: Secondary | ICD-10-CM

## 2021-08-09 DIAGNOSIS — H547 Unspecified visual loss: Secondary | ICD-10-CM

## 2021-08-09 DIAGNOSIS — E1165 Type 2 diabetes mellitus with hyperglycemia: Secondary | ICD-10-CM

## 2021-08-09 DIAGNOSIS — G47419 Narcolepsy without cataplexy: Secondary | ICD-10-CM

## 2021-08-09 DIAGNOSIS — E034 Atrophy of thyroid (acquired): Secondary | ICD-10-CM

## 2021-08-09 DIAGNOSIS — R4189 Other symptoms and signs involving cognitive functions and awareness: Secondary | ICD-10-CM

## 2021-08-09 DIAGNOSIS — G629 Polyneuropathy, unspecified: Secondary | ICD-10-CM

## 2021-08-09 DIAGNOSIS — R29898 Other symptoms and signs involving the musculoskeletal system: Secondary | ICD-10-CM

## 2021-08-09 DIAGNOSIS — R413 Other amnesia: Secondary | ICD-10-CM

## 2021-08-09 DIAGNOSIS — M4802 Spinal stenosis, cervical region: Secondary | ICD-10-CM

## 2021-08-09 DIAGNOSIS — K219 Gastro-esophageal reflux disease without esophagitis: Secondary | ICD-10-CM

## 2021-08-09 DIAGNOSIS — M48061 Spinal stenosis, lumbar region without neurogenic claudication: Secondary | ICD-10-CM

## 2021-08-09 DIAGNOSIS — M069 Rheumatoid arthritis, unspecified: Secondary | ICD-10-CM

## 2021-08-09 DIAGNOSIS — R3989 Other symptoms and signs involving the genitourinary system: Secondary | ICD-10-CM

## 2021-08-09 DIAGNOSIS — Z79899 Other long term (current) drug therapy: Secondary | ICD-10-CM

## 2021-08-09 DIAGNOSIS — E669 Obesity, unspecified: Secondary | ICD-10-CM

## 2021-08-09 DIAGNOSIS — E119 Type 2 diabetes mellitus without complications: Secondary | ICD-10-CM

## 2021-08-09 DIAGNOSIS — U071 COVID-19 virus infection: Secondary | ICD-10-CM

## 2021-08-09 DIAGNOSIS — K760 Fatty (change of) liver, not elsewhere classified: Secondary | ICD-10-CM

## 2021-08-09 DIAGNOSIS — E785 Hyperlipidemia, unspecified: Secondary | ICD-10-CM

## 2021-08-20 ENCOUNTER — Encounter: Admit: 2021-08-20 | Discharge: 2021-08-20 | Payer: MEDICARE

## 2021-08-20 MED FILL — ENBREL SURECLICK 50 MG/ML (1 ML) SC PNIJ: 50 mg/mL (1 mL) | SUBCUTANEOUS | 28 days supply | Qty: 4 | Fill #3 | Status: AC

## 2021-08-29 ENCOUNTER — Encounter: Admit: 2021-08-29 | Discharge: 2021-08-29 | Payer: MEDICARE

## 2021-09-11 ENCOUNTER — Encounter: Admit: 2021-09-11 | Discharge: 2021-09-11 | Payer: MEDICARE

## 2021-09-14 ENCOUNTER — Encounter: Admit: 2021-09-14 | Discharge: 2021-09-14 | Payer: MEDICARE

## 2021-09-17 ENCOUNTER — Encounter: Admit: 2021-09-17 | Discharge: 2021-09-17 | Payer: MEDICARE

## 2021-09-18 ENCOUNTER — Encounter: Admit: 2021-09-18 | Discharge: 2021-09-18 | Payer: MEDICARE

## 2021-09-18 MED FILL — ENBREL SURECLICK 50 MG/ML (1 ML) SC PNIJ: 50 mg/mL (1 mL) | SUBCUTANEOUS | 28 days supply | Qty: 4 | Fill #4 | Status: AC

## 2021-09-20 ENCOUNTER — Encounter: Admit: 2021-09-20 | Discharge: 2021-09-20 | Payer: MEDICARE

## 2021-09-20 MED FILL — FOLIC ACID 1 MG PO TAB: 1 mg | ORAL | 30 days supply | Qty: 120 | Fill #2 | Status: CP

## 2021-09-21 ENCOUNTER — Encounter: Admit: 2021-09-21 | Discharge: 2021-09-21 | Payer: MEDICARE

## 2021-09-21 MED ORDER — PREGABALIN 75 MG PO CAP
75 mg | ORAL_CAPSULE | Freq: Three times a day (TID) | ORAL | 1 refills | Status: AC
Start: 2021-09-21 — End: ?

## 2021-09-21 NOTE — Telephone Encounter
9/30 Spoke w/ pt's daughter to f/u and find out if pt is still wanting to get set up w/ CPAP.  Per Juliann Pulse, they are still wanting to be set up, but [facility] has told them about 3 wks ago that she's on a wait list.  She will try to remember to give them a call on Monday or Tuesday when [place] (the RT at DME) is in the office.  Daughter is unsure why DME said that they've not been able to reach her because she's spoken with them several times.     Will continue to f/u on CPAP order.

## 2021-09-21 NOTE — Telephone Encounter
Spoke w/ pt's daughter and Suzie Portela never got the Lyrica script.  Rx re-entered and routed to Dr Andres Labrum for signature to go to [facility] per daughter request.

## 2021-10-02 ENCOUNTER — Encounter: Admit: 2021-10-02 | Discharge: 2021-10-02 | Payer: MEDICARE

## 2021-10-03 ENCOUNTER — Ambulatory Visit: Admit: 2021-10-03 | Discharge: 2021-10-03 | Payer: MEDICARE

## 2021-10-03 ENCOUNTER — Encounter: Admit: 2021-10-03 | Discharge: 2021-10-03 | Payer: MEDICARE

## 2021-10-03 DIAGNOSIS — E119 Type 2 diabetes mellitus without complications: Secondary | ICD-10-CM

## 2021-10-03 DIAGNOSIS — J45909 Unspecified asthma, uncomplicated: Secondary | ICD-10-CM

## 2021-10-03 DIAGNOSIS — M069 Rheumatoid arthritis, unspecified: Secondary | ICD-10-CM

## 2021-10-03 DIAGNOSIS — R3989 Other symptoms and signs involving the genitourinary system: Secondary | ICD-10-CM

## 2021-10-03 DIAGNOSIS — G629 Polyneuropathy, unspecified: Secondary | ICD-10-CM

## 2021-10-03 DIAGNOSIS — R911 Solitary pulmonary nodule: Secondary | ICD-10-CM

## 2021-10-03 DIAGNOSIS — K5790 Diverticulosis of intestine, part unspecified, without perforation or abscess without bleeding: Secondary | ICD-10-CM

## 2021-10-03 DIAGNOSIS — E669 Obesity, unspecified: Secondary | ICD-10-CM

## 2021-10-03 DIAGNOSIS — J40 Bronchitis, not specified as acute or chronic: Secondary | ICD-10-CM

## 2021-10-03 DIAGNOSIS — R4189 Other symptoms and signs involving cognitive functions and awareness: Secondary | ICD-10-CM

## 2021-10-03 DIAGNOSIS — R413 Other amnesia: Secondary | ICD-10-CM

## 2021-10-03 DIAGNOSIS — R569 Unspecified convulsions: Secondary | ICD-10-CM

## 2021-10-03 DIAGNOSIS — M4802 Spinal stenosis, cervical region: Secondary | ICD-10-CM

## 2021-10-03 DIAGNOSIS — R32 Unspecified urinary incontinence: Secondary | ICD-10-CM

## 2021-10-03 DIAGNOSIS — G47419 Narcolepsy without cataplexy: Secondary | ICD-10-CM

## 2021-10-03 DIAGNOSIS — Z79899 Other long term (current) drug therapy: Secondary | ICD-10-CM

## 2021-10-03 DIAGNOSIS — R54 Age-related physical debility: Secondary | ICD-10-CM

## 2021-10-03 DIAGNOSIS — E7849 Other hyperlipidemia: Secondary | ICD-10-CM

## 2021-10-03 DIAGNOSIS — F3341 Major depressive disorder, recurrent, in partial remission: Secondary | ICD-10-CM

## 2021-10-03 DIAGNOSIS — E034 Atrophy of thyroid (acquired): Secondary | ICD-10-CM

## 2021-10-03 DIAGNOSIS — G4733 Obstructive sleep apnea (adult) (pediatric): Secondary | ICD-10-CM

## 2021-10-03 DIAGNOSIS — G479 Sleep disorder, unspecified: Secondary | ICD-10-CM

## 2021-10-03 DIAGNOSIS — M1990 Unspecified osteoarthritis, unspecified site: Secondary | ICD-10-CM

## 2021-10-03 DIAGNOSIS — R29898 Other symptoms and signs involving the musculoskeletal system: Secondary | ICD-10-CM

## 2021-10-03 DIAGNOSIS — K760 Fatty (change of) liver, not elsewhere classified: Secondary | ICD-10-CM

## 2021-10-03 DIAGNOSIS — E785 Hyperlipidemia, unspecified: Secondary | ICD-10-CM

## 2021-10-03 DIAGNOSIS — U071 COVID-19 virus infection: Secondary | ICD-10-CM

## 2021-10-03 DIAGNOSIS — H547 Unspecified visual loss: Secondary | ICD-10-CM

## 2021-10-03 DIAGNOSIS — E1165 Type 2 diabetes mellitus with hyperglycemia: Secondary | ICD-10-CM

## 2021-10-03 DIAGNOSIS — M48061 Spinal stenosis, lumbar region without neurogenic claudication: Secondary | ICD-10-CM

## 2021-10-03 DIAGNOSIS — K219 Gastro-esophageal reflux disease without esophagitis: Secondary | ICD-10-CM

## 2021-10-03 DIAGNOSIS — J309 Allergic rhinitis, unspecified: Secondary | ICD-10-CM

## 2021-10-03 DIAGNOSIS — E039 Hypothyroidism, unspecified: Secondary | ICD-10-CM

## 2021-10-03 MED ORDER — ORENCIA 125 MG/ML SC SYRG
125 mg | SUBCUTANEOUS | 3 refills | 28.00000 days | Status: AC
Start: 2021-10-03 — End: ?
  Filled 2021-10-10: qty 4, 28d supply, fill #1

## 2021-10-03 MED FILL — INSULIN ASPART 100 UNIT/ML SC FLEXPEN: 100 unit/mL (3 mL) | SUBCUTANEOUS | 21 days supply | Qty: 30 | Fill #2 | Status: CP

## 2021-10-04 ENCOUNTER — Encounter: Admit: 2021-10-04 | Discharge: 2021-10-04 | Payer: MEDICARE

## 2021-10-05 ENCOUNTER — Encounter: Admit: 2021-10-05 | Discharge: 2021-10-05 | Payer: MEDICARE

## 2021-10-05 NOTE — Progress Notes
The Prior Authorization for [name] has been submitted for [name] via Cover My Meds.  Will continue to follow.    [place] Patient Advocate  [phone]

## 2021-10-09 ENCOUNTER — Encounter: Admit: 2021-10-09 | Discharge: 2021-10-09 | Payer: MEDICARE

## 2021-10-09 NOTE — Progress Notes
Pharmacy Medication Initial Assessment and Education    The patient's caregiver (daughter ) participated on the patient's behalf. All references to the patient herein were completed with the caregiver on the patient's behalf.    Indication/Regimen  The regimen of ABATACEPT 125 MG/ML SC ATIN indefinitely is appropriate for Hailey Rodgers who has Rheumatoid arthritis (HCC).    Renal dose adjustments are not required. Hepatic dose adjustments are not required. Dose titration is not required.    The patient has a caregiver who can administer the medication(s).    Baseline Characteristics  Current DMARDs: Orencia, Methotrexate  Previous DMARDs: Humira, Enbrel   Additional considerations: hx of diverticulitis   Baseline labs were completed and evaluated.    Therapeutic Goals and Monitoring  The goal of therapy is to improve or control symptoms and disease activity.    Past Medical History and Comorbidities  Patient Active Problem List   Diagnosis   ? Psychogenic nonepileptic seizure   ? Obstructive sleep apnea   ? Multilevel spinal stenosis   ? Cervical stenosis of spine   ? Exophoria   ? Convergence insufficiency   ? Cataract   ? Dry eye   ? MGD (meibomian gland dysfunction)   ? Myopia of both eyes with astigmatism and presbyopia   ? Dermatochalasis of both upper eyelids   ? Spondylolisthesis   ? Generalized anxiety disorder   ? Non epileptic seizures   ? Pseudoarthrosis of cervical spine (HCC)   ? Spinal stenosis of lumbar region with neurogenic claudication   ? Spondylolisthesis of lumbar region   ? Ptosis of both eyelids   ? Dermatochalasis of eyelids of both eyes   ? Persistent depressive disorder with anxious distress, currently moderate   ? Poor sleep   ? Family problems   ? Panic disorder without agoraphobia   ? Memory impairment   ? Frequent falls   ? Insulin dependent type 2 diabetes mellitus (HCC)   ? Rheumatoid arthritis (HCC)   ? Hypothyroidism due to acquired atrophy of thyroid   ? Age-related physical debility   ? Gastroesophageal reflux disease without esophagitis   ? Mixed hyperlipidemia   ? Pulmonary nodule, left   ? COVID-19 virus infection   ? Diverticulosis   ? Fatty liver disease, nonalcoholic   ? De Quervain's tenosynovitis, left   ? Cervical radiculopathy at C8   ? Syncope   ? Stress incontinence   ? Seasonal allergic rhinitis   ? Restless legs syndrome (RLS)   ? Polyneuropathy associated with underlying disease (HCC)   ? Insufficient sleep syndrome   ? Hypothyroidism (acquired)   ? Hypersomnia with sleep apnea   ? Essential hypertension   ? Cubital tunnel syndrome on left   ? Dysphagia   ? Coronary atherosclerosis of native coronary artery   ? Class 1 obesity due to excess calories in adult   ? Aortic insufficiency   ? Seborrheic keratosis   ? Actinic keratosis   ? Intrinsic atopic dermatitis   ? Rheumatoid arthritis flare (HCC)   ? High risk medication use   ? Primary osteoarthritis involving multiple joints   ? Joint pain   ? Pain in both lower extremities     Additional comorbidities: no    Labs and Diagnostic Tests  TB Screening  T Spot TB   Date Value Ref Range Status   11/10/2020   Final    Negative  Reference range: Normal Value: Negative  A negative test result does not  exclude the possibility of exposure  to or infection with Mycobacterium tuberculosis (M. tuberculosis).   Patients with recent exposure to TB infected individuals exhibiting a  negative T-SPOT.TB result should be considered for retesting within 6  weeks or if other relevant clinical symptoms indicate.  Results from  T-SPOT.TB testing must be used in conjunction with each individual's  epidemiological history, current medical status, and results of other  diagnostic evaluations.[id number] T-SPOT.TB test is qualitative  and results are reported as positive, borderline or negative, given  that the test controls perform as expected. In line with the Centers  for Disease Control and Prevention's 2010 recommendation to report  quantitative measurements alongside the qualitative result, the  laboratory provides spot counts for informational purposes only.  The  T-SPOT.TB test should not be interpreted as a quantitative test.         Hepatitis B Screening  HBsAg   Date/Time Value Ref Range Status   10/07/2020 04:07 AM NONREACTIVE NR-NONREACTIVE Final     Comment:     HBs antigen not detected.     Anti HBc Total   Date Value Ref Range Status   10/07/2020 NONREACTIVE NR-NONREACTIVE Final     Comment:     Antibodies to HBV core antigen (anti-HBc) were not detected.     Anti HBs   Date/Time Value Ref Range Status   10/07/2020 04:07 AM NEG NEG-NEG Final     Comment:     Individual is considered to be non-immune to HBV infection.        Other Pertinent Labs   None     Allergies  Allergies   Allergen Reactions   ? Amoxicillin-Pot Clavulanate HIVES   ? Gemfibrozil HIVES   ? Trazodone HALLUCINATIONS   ? Adhesive Tape (Rosins) RASH   ? Atorvastatin SEE COMMENTS     States it caused her to have a fatty liver.   Reaction: LFT elevation; Comment: LIPITOR  Reaction: LFT elevation; Comment: LIPITOR  Reaction: LFT elevation; Comment: LIPITOR  Reaction: LFT elevation; Comment: LIPITOR     ? Latex RASH   ? Bupropion SEE COMMENTS     Allergy recorded in SMS: WELLBUTRIN~Reactions: ABNORMAL SEIZUR   ? Cymbalta [Duloxetine] UNKNOWN   ? Levaquin [Levofloxacin] DIARRHEA        Immunizations  Vaccine history was reviewed with the patient. Education was provided on the importance of completing vaccines.    Immunization History   Administered Date(s) Administered   ? COVID-19 (MODERNA), mRNA vacc, 100 mcg/0.5 mL (PF) 10/07/2020   ? Flu Vaccine =>65 YO High-Dose Quadrivalent (PF) 10/25/2020       Home Medications    Medication Sig   abatacept (ORENCIA CLICKJECT) 125 mg/mL injectable PEN Inject 1 mL under the skin every 7 days.   acetaminophen (TYLENOL EXTRA STRENGTH) 500 mg tablet Take two tablets by mouth every 6 hours as needed. Max of 4,000 mg of acetaminophen in 24 hours.  Indications: pain   aspirin 81 mg chewable tablet Chew 81 mg by mouth at bedtime daily.   blood-glucose sensor (DEXCOM G6 SENSOR MISC) Use  as directed.   calcium carbonate/vitamin D-3 (OSCAL-500+D) 1250 mg/200 unit tablet Take one tablet by mouth twice daily. Calcium Carb 1250mg  delivers 500mg  elemental Ca   celecoxib (CELEBREX) 100 mg capsule Take 100 mg by mouth twice daily.   DIPH/LIDO/ANTACID 1:1:1 (COMPOUND) Swish and Spit 5 mL by mouth as directed every 4 hours as needed.   dulaglutide (TRULICITY) 1.5 mg/0.5 mL injection pen Inject  1.5 mg under the skin every Wednesday.   ergocalciferol (VITAMIN D-2) 50,000 unit capsule Take 50,000 Units by mouth every 7 days.   ferrous sulfate (FEOSOL) 325 mg (65 mg iron) tablet Take 325 mg by mouth twice daily. Take on an empty stomach at least 1 hour before or 2 hours after food.   fexofenadine(+) (ALLEGRA) 180 mg tablet Take 180 mg by mouth daily.   fluocinolone acetonide oil (DERMOTIC OIL) 0.01 % otic drops INSTILL 3 DROPS INTO NOSE DAILY AS DIRECTED   folic acid (FOLVITE) 1 mg tablet Take four tablets by mouth daily.   furosemide (LASIX) 40 mg tablet Take 40 mg by mouth daily as needed.   insulin aspart (U-100) (NOVOLOG FLEXPEN U-100 INSULIN) 100 unit/mL (3 mL) PEN Inject 25 units under the skin prior to meals. Add sliding scale based on a target of 150 and ISF 1-10. For levels 501 and above, go to ER. Use up to 145 units per day.   insulin aspart U-100 (NOVOLOG U-100 INSULIN ASPART) 100 unit/mL injection Inject twelve Units under the skin three times daily with meals. Take 12 units with breakfast, 22 units with lunch, and 30 units with dinner.  Indications: type 2 diabetes mellitus  Patient taking differently: Inject 25 Units under the skin three times daily before meals. Plus sliding scale: if BG 150-159 = 1 unit, 160-169 = 2 units  Indications: type 2 diabetes mellitus   insulin degludec (TRESIBA FLEXTOUCH U-200) 200 unit/mL (3 mL) injection PEN Use as directed. Inject up to 100 Units per day.   insulin degludec (TRESIBA FLEXTOUCH) 200 unit/mL (3 mL) injection PEN Inject eighty Units under the skin at bedtime daily.  Patient taking differently: Inject 100 Units under the skin at bedtime daily.   Insulin Needles (Disposable) (NANO PEN NEEDLE) 32 gauge x 5/32 ndle Use 1 Each as directed before meals and at bedtime.   lactobacillus rhamnosus GG (CULTURELLE) 15 billion cell capsule Take 1 capsule by mouth as directed daily.   levothyroxine (SYNTHROID) 88 mcg tablet Take 88 mcg by mouth daily.   lidocaine (ASPERCREME PATCH) 4 % topical patch Apply 1 patch topically to affected area daily.   methotrexate sodium 2.5 mg tablet Take eight tablets by mouth every Friday.   mirabegron (MYRBETRIQ) 50 mg ER tablet Take 50 mg by mouth daily.   nortriptyline (PAMELOR) 25 mg capsule Take 25 mg by mouth at bedtime daily.   NYSTOP 100,000 unit/gram topical powder Apply to clean, dry skin 2-3 times daily for yeast infection   oxyCODONE/acetaminophen (PERCOCET) 5/325 mg tablet Take one tablet to two tablets by mouth every 6 hours as needed   pantoprazole DR (PROTONIX) 40 mg tablet Take 40 mg by mouth twice daily.   pioglitazone (ACTOS) 15 mg tablet Take 45 mg by mouth daily.   polyethylene glycol 3350 (MIRALAX) 17 g packet Take one packet by mouth twice daily as needed.   potassium chloride (KLOR-CON 10) 10 mEq tablet Take 2 tablets by mouth every day as needed if taking furosemide for edema.   pramipexole (MIRAPEX) 0.75 mg tablet Take one tablet by mouth twice daily. Take one tablet at 2pm and one at bedtime.   pregabalin (LYRICA) 75 mg capsule Take one capsule by mouth three times daily. Indications: restless legs syndrome, an extreme discomfort in the calf muscles when sitting or lying down   rosuvastatin (CRESTOR) 20 mg tablet Take 1 tablet by mouth daily.   senna/docusate (SENOKOT-S) 8.6/50 mg tablet Take one tablet by  mouth twice daily.  Patient taking differently: Take 2 tablets by mouth twice daily.   venlafaxine XR (EFFEXOR XR) 150 mg capsule TAKE 1 CAPSULE BY MOUTH ONCE DAILY WITH  BREAKFAST   vitamins, multi w/minerals 9 mg iron-400 mcg tab Take 1 tablet by mouth daily.     Medication Reconciliation  Medication history and reconciliation were performed (including prescription medications, supplements, over the counter, and herbal products). The medication list was updated and the patient's current medication list is included above. The patient was instructed to speak with their health care provider before starting any new drug, including prescription or over the counter, natural / herbal products, or vitamins.    Drug Interactions    Drug-Drug Interactions  Drug-drug interactions were evaluated. There were not clinically significant drug-drug interactions.     Drug-Food Interactions  Drug-food interactions were not evaluated (NA - not oral).    Safety Precautions    Risk Evaluation and Mitigation (REMS) Assessment: REMS is not required for this medication.    Safety precautions were addressed and discussed with the patient as applicable.    Contraindications: Hailey Rodgers does not have contraindications to this medication.      Infection Screening:    TB screening has been completed and result was negative - proceed with treatment.    Hepatitis B screening has been completed and result was negative - proceed with treatment.    Hepatitis C screening has been completed and result was negative - proceed with treatment.    Pregnancy Status: Female, not of child-bearing potential, education not applicable.    Medication Education  The patient was counseled via telephone. 5 minutes were spent educating the patient.    Hailey Rodgers was provided with education on their specialty medication(s). Discussion with the patient included: the medication name, regimen, dosing, frequency, duration, proper administration, monitoring, common side effects, contraindications, safety precautions, and food/drug interactions to be aware of. Appropriate storage, safe handling, and disposal directions were reviewed with the patient. Emphasis was placed on the importance of medication compliance. The patient's ability to self-administer medication was assessed. Requirements of the REMS program were discussed with the patient as applicable. Recommended vaccinations were reviewed and discussed with the patient as applicable. The patient was instructed to seek medical attention immediately if they experience signs of an allergic reaction, including but not limited to: a rash; hives; itching; red, swollen, blistered, or peeling skin with or without fever.    Patient was educated on proper administration technique as well as storage/disposal. The patient demonstrated correct technique after instruction.    Provided education on abatacept (Orencia), including:  -purpose, expected benefit  -dosing   -SC: 125mg  weekly  -side effects   -most common: HA, nausea   -rare: COPD exacerbation, injection site reactions  -infection prevention   -staying up to date with inactivated vaccinations, avoid live vaccinations   -contact clinic during significant illness (fever or antibiotic use)   -inform clinic if any procedures are scheduled  -monitoring    -TB screening          Hailey Rodgers was given the opportunity to ask questions but did not have any questions at the time. Patient was reminded of the refill process and encouraged to call with questions. The monitoring and follow-up plan was discussed with the patient. The patient was instructed to contact their health care provider if their symptoms or health problems do not get better or if they become worse. The patient should contact  the specialty pharmacy at [phone] if they have any questions or concerns regarding their medication therapy. The patient verbalized acceptance and understanding.    Follow-up Plan  The patient will be reassessed within 3 months.    The medication(s) will be shipped from [facility].    Lelon Mast, PHARMD

## 2021-10-09 NOTE — Progress Notes
The Prior Authorization for [name] was approved for HARUYE LAINEZ until 12/22/22.  The copay is $0.00.      The ambulatory pharmacist has been notified of the approval in order to provide education prior to dispense of the medication.  Will await notification from the pharmacist that it is OK to set up the fill per the patient's preferred delivery method.    [facility] Patient Advocate  [phone]

## 2021-10-12 ENCOUNTER — Encounter: Admit: 2021-10-12 | Discharge: 2021-10-12 | Payer: MEDICARE

## 2021-10-12 MED FILL — FOLIC ACID 1 MG PO TAB: 1 mg | ORAL | 30 days supply | Qty: 120 | Fill #3 | Status: AC

## 2021-10-15 ENCOUNTER — Ambulatory Visit: Admit: 2021-10-15 | Discharge: 2021-10-15 | Payer: MEDICARE

## 2021-10-15 ENCOUNTER — Encounter: Admit: 2021-10-15 | Discharge: 2021-10-15 | Payer: MEDICARE

## 2021-10-15 DIAGNOSIS — M069 Rheumatoid arthritis, unspecified: Secondary | ICD-10-CM

## 2021-10-15 LAB — CBC AND DIFF
ABSOLUTE BASO COUNT: 0 K/UL (ref 0–0.20)
ABSOLUTE NEUTROPHIL: 1.8 K/UL — ABNORMAL HIGH (ref 1.8–7.0)
BASOPHILS %: 1 % (ref 0–2)
HEMATOCRIT: 36 % (ref 36–45)
HEMOGLOBIN: 12 g/dL (ref 12.0–15.0)
LYMPHOCYTES %: 40 % (ref 24–44)
MCH: 29 pg (ref 26–34)
MCV: 87 FL (ref 80–100)
MPV: 8 FL (ref 7–11)
NEUTROPHILS %: 41 % (ref 41–77)
RBC COUNT: 4.1 M/UL (ref 4.0–5.0)
RDW: 14 % (ref 11–15)
WBC COUNT: 4.4 K/UL — ABNORMAL LOW (ref 4.5–11.0)

## 2021-10-15 LAB — URINALYSIS DIPSTICK
LEUKOCYTES: NEGATIVE
NITRITE: NEGATIVE
URINE ASCORBIC ACID, UA: NEGATIVE
URINE BILE: NEGATIVE
URINE BLOOD: NEGATIVE
URINE KETONE: NEGATIVE

## 2021-10-15 LAB — COMPREHENSIVE METABOLIC PANEL
BLD UREA NITROGEN: 23 mg/dL (ref 7–25)
CALCIUM: 10 mg/dL (ref 8.5–10.6)
CREATININE: 0.6 mg/dL (ref 0.4–1.00)
POTASSIUM: 4.2 MMOL/L (ref 3.5–5.1)
SODIUM: 138 MMOL/L — ABNORMAL HIGH (ref 137–147)

## 2021-10-15 LAB — PROTEIN/CR RATIO,UR RAN
UR CREATININE, RAN: 49 mg/dL
UR TOTAL PROTEIN,RAN: <4 mg/dL

## 2021-10-15 LAB — SED RATE: ESR: 20 mm/h (ref 0–30)

## 2021-10-15 LAB — URINALYSIS, MICROSCOPIC

## 2021-10-15 LAB — C REACTIVE PROTEIN (CRP): C-REACTIVE PROTEIN: 0 mg/dL (ref <1.0)

## 2021-10-24 ENCOUNTER — Encounter: Admit: 2021-10-24 | Discharge: 2021-10-24 | Payer: MEDICARE

## 2021-10-24 DIAGNOSIS — F410 Panic disorder [episodic paroxysmal anxiety] without agoraphobia: Secondary | ICD-10-CM

## 2021-10-24 DIAGNOSIS — F411 Generalized anxiety disorder: Secondary | ICD-10-CM

## 2021-10-24 DIAGNOSIS — F445 Conversion disorder with seizures or convulsions: Secondary | ICD-10-CM

## 2021-10-24 MED ORDER — METHOTREXATE SODIUM 2.5 MG PO TAB
ORAL_TABLET | 0 refills | 28.00000 days | Status: AC
Start: 2021-10-24 — End: ?

## 2021-10-24 MED ORDER — VENLAFAXINE 150 MG PO CP24
150 mg | ORAL_CAPSULE | Freq: Every day | ORAL | 0 refills
Start: 2021-10-24 — End: ?

## 2021-10-24 NOTE — Telephone Encounter
Pharmacy is requesting a refill of MTX. Pt last seen on 10/03/21. Last labs were drawn on 10/15/21. Pt is scheduled for follow up on 01/14/22.    PLAN:   -We will switch etanercept to abatacept 125 mg subcutaneous weekly (discussed with our pharmacists and order placed)  -Continue methotrexate 20 mg p.o. weekly with 4 mg daily of folic acid. Continue high risk medication lab monitoring (CBC with differential, AST, serum creatinine) every 3 months while on methotrexate. Noted hx of NAFLD but no steatohepatitis.  -TB spot, hepatitis B core IgG, IgM, surface antibody and antigen and hepatitis C panel checked in 2021, unremarkable  -Refer to dermatology for scalp rash  -will obtain records of BMD test result (from PCP Helayne Seminole [phone], notified clinic staff)  -Return to clinic in 8 weeks     Renewed per protocol

## 2021-11-05 ENCOUNTER — Encounter: Admit: 2021-11-05 | Discharge: 2021-11-05 | Payer: MEDICARE

## 2021-11-05 MED FILL — INSULIN ASPART 100 UNIT/ML SC FLEXPEN: 100 unit/mL (3 mL) | SUBCUTANEOUS | 21 days supply | Qty: 30 | Fill #3 | Status: CP

## 2021-11-05 MED FILL — FOLIC ACID 1 MG PO TAB: 1 mg | ORAL | 30 days supply | Qty: 120 | Fill #4 | Status: AC

## 2021-11-06 ENCOUNTER — Encounter: Admit: 2021-11-06 | Discharge: 2021-11-06 | Payer: MEDICARE

## 2021-11-06 MED FILL — TRESIBA FLEXTOUCH U-200 200 UNIT/ML (3 ML) SC INPN: 200 unit/mL (3 mL) | SUBCUTANEOUS | 18 days supply | Qty: 9 | Fill #2 | Status: AC

## 2021-11-07 ENCOUNTER — Encounter: Admit: 2021-11-07 | Discharge: 2021-11-07 | Payer: MEDICARE

## 2021-11-08 ENCOUNTER — Encounter: Admit: 2021-11-08 | Discharge: 2021-11-08 | Payer: MEDICARE

## 2021-11-10 ENCOUNTER — Encounter: Admit: 2021-11-10 | Discharge: 2021-11-10 | Payer: MEDICARE

## 2021-11-14 ENCOUNTER — Encounter: Admit: 2021-11-14 | Discharge: 2021-11-14 | Payer: MEDICARE

## 2021-11-19 MED FILL — TRESIBA FLEXTOUCH U-200 200 UNIT/ML (3 ML) SC INPN: 200 unit/mL (3 mL) | SUBCUTANEOUS | 18 days supply | Qty: 9 | Fill #2 | Status: AC

## 2021-11-19 MED FILL — ORENCIA CLICKJECT 125 MG/ML SC ATIN: 125 mg/mL | SUBCUTANEOUS | 28 days supply | Qty: 4 | Fill #2 | Status: AC

## 2021-11-20 ENCOUNTER — Encounter: Admit: 2021-11-20 | Discharge: 2021-11-20 | Payer: MEDICARE

## 2021-11-21 ENCOUNTER — Encounter: Admit: 2021-11-21 | Discharge: 2021-11-21 | Payer: MEDICARE

## 2021-11-23 ENCOUNTER — Encounter: Admit: 2021-11-23 | Discharge: 2021-11-23 | Payer: MEDICARE

## 2021-11-29 ENCOUNTER — Encounter: Admit: 2021-11-29 | Discharge: 2021-11-29 | Payer: MEDICARE

## 2021-11-29 ENCOUNTER — Ambulatory Visit: Admit: 2021-11-29 | Discharge: 2021-11-29 | Payer: MEDICARE

## 2021-11-29 DIAGNOSIS — F331 Major depressive disorder, recurrent, moderate: Secondary | ICD-10-CM

## 2021-11-29 DIAGNOSIS — F445 Conversion disorder with seizures or convulsions: Secondary | ICD-10-CM

## 2021-11-29 DIAGNOSIS — F341 Dysthymic disorder: Secondary | ICD-10-CM

## 2021-11-29 DIAGNOSIS — F39 Unspecified mood [affective] disorder: Secondary | ICD-10-CM

## 2021-11-29 NOTE — Progress Notes
Progress Note  START TIME: 2:25 PM  STOP TIME: 3:00 PM  ?  BEHAVIORS/SYMPTOMS/DATA/PATIENT COMPLAINT:  Pt late to visit due to traffic/weather.  Pt reports that she has not been doing well.  She lost her sister since last seen and this has been difficult.  She has not had meds as she will not return to her psychiatrist due to her being very late.  She tried another psychologist in [place] due to the drive but feels it is better with this clinician.  Pt reports missing the clinician.  She brought a very small item for Christmas.  She is very frustrated and angry about her husband and family who will not clean up and she feels compelled to do so- but not longer can due to health.  She is distressed that her home is so messy.  She has been having trouble sleeping and has even had a thought of suicide but denies any intent of doing this. Has crying spells. Also has thoughts run through her mind about the way her husband responded to her negatively.                             MENTAL STATUS EXAM AND BEHAVIORAL OBSERVATIONS:  The patient is oriented and alert.  Mood is irritated and depressed.  Affect is full range, irritable and disregulated.  Insight is fair.  No suicidal or homicidal intent.  Occasional suicide thought with no plan or intent.       ?  INTERVENTIONS/RESPONSE:  Supportive Psychotherapy.  Focus of meeting was on learning developments since last seen and providing emotional support and validation to help bolster mood and stress response. Suggested using the leaves on a stream metaphor to help with her intrusive thoughts about husband.  Suggested pursuing home health due to her poor health.  Gave her name of psychiatrist closer to her home.  Recommended she contact PCP in the meantime to refill her venlafaxine or schedule to see psychiatrist in this clinic.  Patient should follow up for supportive therapy.                                      ?  IMPRESSIONS/DIAGNOSES (ICD 10):  ?  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  TREATMENT PLAN:  Supportive psychotherapy.  Establish care with psychiatrist.    ?  ?  [ X   ]  The proposed treatment plan was discussed with the patient/guardian who was provided the opportunity to ask questions and make suggestions regarding alternative treatment.  If not, why not  Risks/Benefits of Treatment Options Reviewed:  YES  ?  RETURN APPOINTMENT: Monthly.  ?  Thurmon Fair. Durene Cal, Ph.D., ABPP  Board Certified in Clinical Psychology

## 2021-12-20 ENCOUNTER — Encounter: Admit: 2021-12-20 | Discharge: 2021-12-20 | Payer: MEDICARE

## 2021-12-20 MED FILL — ORENCIA CLICKJECT 125 MG/ML SC ATIN: 125 mg/mL | SUBCUTANEOUS | 28 days supply | Qty: 4 | Fill #3 | Status: AC

## 2021-12-21 ENCOUNTER — Encounter: Admit: 2021-12-21 | Discharge: 2021-12-21 | Payer: MEDICARE

## 2021-12-21 MED ORDER — METHOTREXATE SODIUM 2.5 MG PO TAB
ORAL_TABLET | 0 refills | 28.00000 days | Status: AC
Start: 2021-12-21 — End: ?

## 2021-12-21 NOTE — Telephone Encounter
Pharmacy is requesting a refill of MTX. Pt last seen on 10/03/21. Last labs were drawn on 10/15/21. Pt is scheduled for follow up on 01/14/22.    PLAN:   -We will switch etanercept to abatacept 125 mg subcutaneous weekly (discussed with our pharmacists and order placed)  -Continue methotrexate 20 mg p.o. weekly with 4 mg daily of folic acid. Continue high risk medication lab monitoring (CBC with differential, AST, serum creatinine) every 3 months while on methotrexate. Noted hx of NAFLD but no steatohepatitis.  -TB spot, hepatitis B core IgG, IgM, surface antibody and antigen and hepatitis C panel checked in 2021, unremarkable  -Refer to dermatology for scalp rash  -will obtain records of BMD test result (from PCP Helayne Seminole [phone], notified clinic staff)  -Return to clinic in 8 weeks     Renewed per protocol

## 2021-12-25 ENCOUNTER — Encounter: Admit: 2021-12-25 | Discharge: 2021-12-25 | Payer: MEDICARE

## 2021-12-31 ENCOUNTER — Encounter: Admit: 2021-12-31 | Discharge: 2021-12-31 | Payer: MEDICARE

## 2021-12-31 MED FILL — FOLIC ACID 1 MG PO TAB: 1 mg | ORAL | 30 days supply | Qty: 120 | Fill #5 | Status: AC

## 2021-12-31 NOTE — Progress Notes
Attempted to reach patient for Orencia reassessment . Left voicemail requesting a call back. Provided patient with pharmacist clinic phone number [phone]). If no return call from patient, will attempt again at a later date.    Cindra Presume, [facility]

## 2022-01-02 ENCOUNTER — Encounter: Admit: 2022-01-02 | Discharge: 2022-01-02 | Payer: MEDICARE

## 2022-01-08 ENCOUNTER — Encounter: Admit: 2022-01-08 | Discharge: 2022-01-08 | Payer: MEDICARE

## 2022-01-09 ENCOUNTER — Encounter: Admit: 2022-01-09 | Discharge: 2022-01-09 | Payer: MEDICARE

## 2022-01-09 ENCOUNTER — Ambulatory Visit: Admit: 2022-01-09 | Discharge: 2022-01-09 | Payer: MEDICARE

## 2022-01-09 DIAGNOSIS — F331 Major depressive disorder, recurrent, moderate: Secondary | ICD-10-CM

## 2022-01-09 DIAGNOSIS — E119 Type 2 diabetes mellitus without complications: Secondary | ICD-10-CM

## 2022-01-09 DIAGNOSIS — E785 Hyperlipidemia, unspecified: Secondary | ICD-10-CM

## 2022-01-09 DIAGNOSIS — R509 Fever, unspecified: Secondary | ICD-10-CM

## 2022-01-09 DIAGNOSIS — M4800 Spinal stenosis, site unspecified: Secondary | ICD-10-CM

## 2022-01-09 DIAGNOSIS — E860 Dehydration: Secondary | ICD-10-CM

## 2022-01-09 DIAGNOSIS — I011 Acute rheumatic endocarditis: Secondary | ICD-10-CM

## 2022-01-09 DIAGNOSIS — E1165 Type 2 diabetes mellitus with hyperglycemia: Secondary | ICD-10-CM

## 2022-01-09 NOTE — Progress Notes
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  ?  BRIEF SUMMARY:?  Patient is a Caucasian female with history of?DM2, hypothyroidism, pseudoseizure and depression?who presented to the hospital in 09/2020 after a fall and joint pain.?Affected joints include?hands and wrists (wrists radiating down to her fingertips with the right hand more than the left hand), right shoulder, left knee, and the bottoms of her heels.?Pain had inflammatory characteristics by history (warmth and swelling). On physical exam, patient had synovitis on physical exam and possibly tenosynovitis in the wrists. Patient also had diffuse swelling in both hands. Labs showed an elevated sedimentation rate (43) and CRP (1.98 mg/dL).?Labs also showed elevated rheumatoid factor (>1200) and CCP antibody (230.3).?Right hand Xray, bilateral wrist Xrays, and bilateral knee Xrays showed degenerative changes but no evidence of inflammatory arthritis.?Patient initiated on prednisone and methotrexate. As patient was still having a significant amount of pain initiating on methotrexate, patient was also started on Humira.  ?  RA features: pain in?hands and wrists, right shoulder, left knee, and the bottoms of her heels with warmth and swelling;  tenosynovitis wrists 2021; sedimentation rate (43) and CRP (1.98 mg/dL); rheumatoid factor (>4540) and CCP antibody (230.3); ?Right hand Xray, bilateral wrist Xrays, and bilateral knee Xrays showed degenerative changes but no evidence of inflammatory arthritis; steroid responsive  ?  PMH: Type 2 diabetes, nonalcoholic fatty liver disease, history of diverticulitis (remote), hypothyroidism, pseudoseizure, obstructive sleep apnea, loop recorder present, osteoarthritis multiple joints  ?  Current Rheumatology Medications:  ? Methotrexate 20 mg by mouth weekly (10/2020 - current)?  ? Abatacept 09/2021 - current (preferred abatacept over rituximab)  ? Folic acid?4?mg daily (was having oral sores)  ? history of diverticulitis which precludes use of Jak 2 inhibitor and IL 6 inhibitor  ? No hx of blood clots  ?  Past treatment  ? Humira 40 mg SQ q14d?then has tried q7d?(11/2020 - 05/2021)?she continued to have flares   ? Enbrel?50 mg SQ weekly?06/2021 - 09/2021 - ineffective  ?  hepatitis B core IgG, IgM, surface antibody and antigen and hepatitis C panel checked in 2021.  TB spot was also checked at that time and unremarkable.    Left shoulder x ray 05/25/21  1. ?No fracture or dislocation identified. Demineralization.   2. ?Large scalloped lucencies are noted within the greater tuberosity as well as additional round lucencies in the humeral head and possibly within the distal clavicle. These are concerning for erosions.?Lucency within the inferior glenoid is favored to reflect degenerative change.   3. ?Moderate narrowing of the glenohumeral joint with moderate spurring of the inferior humeral head.   4. ?Loop recorder is noted over the chest.??    MRI lumbar spine with moderate to severe central spinal and bilateral lateral recess stenosis, relatively unchanged from previous imaging?    INTERVAL HISTORY:??  Patient last seen in clinic 09/2021.  She had high disease activity on CDAI that visit (synovitis mostly in her hands) so her etanercept was switched to abatecpt. Patient tolerating methotrexate 20 mg p.o. weekly and was continued.    She was hospitalized recently (12/2021) for UTI sepsis  Last dose of MTX and Orencia were in 01/03/22, and she was hospitalized 01/04/22 after being found febrile 103 F tmax, She was found to have E coli bacteremia and was thought to be of urinary tract source.  She is currently on  levofloxacin 750mg  once daily.  ?  Eyes: denies painful red eyes or photosensitivity.  ?  Lungs: Seen by pulmonology 07/30/2021.  New CPAP machine ordered during that visit. Per 09/21/2021 documentations pulm clinic LPN unable to reach family members regarding new CPAP machine.  (Recent imaging from her 07/2021 hospitalization CTA chest and 2D echo reviewed and no concerns for interstitial lung disease at this time)  ?  2D echo 12/2021  ? The left ventricular size is normal. Mild concentric hypertrophy. The left ventricular systolic function is normal. The visually estimated ejection fraction is 65%. There are no segmental wall motion abnormalities. Grade I (mild) left ventricular diastolic dysfunction. Normal left atrial pressure.  ? The right ventricular size, wall thickness and systolic function are normal. PASP is estimated at 30 mmHg.  ? Aortic Valve: The valve has focal thickening. No stenosis. Mild regurgitation.  ? The aortic root and ascending aorta are normal in size  ? No pericardial effusion.     Comparison to study 08/04/2021, there is no significant change.    Other CV risk factors: Insulin-dependent type 2 diabetes on pioglitazone, hypothyroidism, obstructive sleep apnea, HTN.    ?  Vaccination: Declined COVID booster as patient without like her RA was related to her first COVID shot and in October 2020. Flu shot from her PCP 11/2021. Pneumonia shot 2019 and 2021. Has not received Shingrix vaccine.  ?  Cancer screening: According to the patient she had a colonoscopy 3 years ago which was normal, mammogram 2 years ago which was normal.  ?  Labs:   09/2021 normal hemoglobin, platelet counts.  WBC 4.4 baseline 4-7 normal differential counts, normal ESR and CRP, normal serum creatinine and liver transaminases, no paraproteinemia normal urine dipstick, microscopic urinalysis and urine protein creatinine ratio  ?  Bone health: Normal bone mineral density scan 2017 left forearm, According to the patient she had a bone mineral density scan about a month ago with her primary care provider and results came back normal.  She is currently on calcium and vitamin D supplementation.       REVIEW OF SYSTEMS:  Review of Systems  Constitutional: Positive for activity change, appetite change, fatigue and fever.   HENT: Positive for dental problem, drooling, hearing loss, rhinorrhea, sinus pressure, sneezing and trouble swallowing.    Eyes: Positive for itching.        Dry eyes   Respiratory: Positive for apnea, cough and shortness of breath.    Gastrointestinal: Positive for constipation and nausea.   Endocrine: Positive for cold intolerance and polyuria.        Dry mouth   Genitourinary: Positive for frequency and urgency.   Musculoskeletal: Positive for arthralgias, back pain, gait problem, joint swelling, myalgias and neck pain.   Skin: Positive for pallor.   Neurological: Positive for dizziness, seizures, numbness and headaches.   Psychiatric/Behavioral: Positive for agitation, confusion, decreased concentration, dysphoric mood and sleep disturbance. The patient is nervous/anxious.    All other systems reviewed and are negative.    PAST MEDICAL HISTORY:  Medical History:   Diagnosis Date   ? Age-related physical debility 10/06/2020   ? Allergic rhinitis    ? Arthritis    ? Asthma    ? Bronchitis    ? Cervical stenosis of spinal canal    ? COVID-19 virus infection 10/06/2020    06/2020   ? Disorganized thinking    ? Diverticulosis 10/06/2020   ? DM (diabetes mellitus) (HCC)    ?  Dyslipidemia    ? Fatty liver disease, nonalcoholic    ? Gastroesophageal reflux disease without esophagitis 10/06/2020   ? GERD (gastroesophageal reflux disease)    ? Hypothyroidism    ? 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 Leg weakness    ? Lumbar stenosis    ? Lung nodule 10/06/2020    10/06/2020 5. ?Right upper lobe groundglass nodule measuring 5 mm which may be  infectious or inflammatory. Multiple additional sub-5 mm nodules  bilaterally, most lymph nodes or granulomas. Follow-up CT in 12 months  recommended for further evaluation if clinically indicated.    ? 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 Obesity    ? OSA (obstructive sleep apnea)    ? Other hyperlipidemia 10/06/2020   ? Peripheral neuropathy    ? Polypharmacy 01/04/2021   ? Pseudoseizures (HCC)    ? 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 Rheumatoid arthritis (HCC) 10/06/2020   ? Sleep disorder     Sleep Apnea & Narcolepsy   ? Type 2 diabetes mellitus with hyperglycemia, with long-term current use of insulin (HCC) 10/06/2020   ? Urinary incontinence    ? Urinary problem     urinary frequency   ? Vision decreased     diplopia       PAST SURGICAL HISTORY:  Surgical History:   Procedure Laterality Date   ? HX ADENOIDECTOMY  1962   ? HX TONSILLECTOMY  1962   ? TUBAL LIGATION  1977   ? RECTAL SURGERY  1979    for rectal prolapse; bladder repair   ? CHOLECYSTECTOMY  1984   ? HYSTERECTOMY  2000   ? BREAST BIOPSY Right 2012   ? COLONOSCOPY  2016   ? CERVICAL 3 TO THORACIC 7 DECOMPRESSION AND FUSION Bilateral 07/18/2015    Performed by Storm Frisk, MD at [facility]   ? BLEPHAROPLASTY     ? HERNIA REPAIR  1984/2007   ? SEPTOPLASTY  1986/2013   ? UPPER GASTROINTESTINAL ENDOSCOPY         SOCIAL HISTORY:  Social History     Tobacco Use   ? Smoking status: Never   ? Smokeless tobacco: Never   Substance Use Topics   ? Alcohol use: No     Alcohol/week: 0.0 standard drinks       FAMILY HISTORY:  Family History   Problem Relation Age of Onset   ? Cancer Mother    ? Thyroid Disease Mother    ? Hypertension Mother    ? Cataract Mother    ? Cancer Father         pancreatic   ? Cancer Other    ? Stroke Paternal Grandfather    ? Cancer Paternal Aunt    ? Coronary Artery Disease Paternal Grandmother    ? Strabismus Neg Hx    ? Retinal Detachment Neg Hx    ? Neurologic Disorder Neg Hx    ? Macular Degen Neg Hx    ? Diabetes Neg Hx    ? Glaucoma Neg Hx    ? Blindness Neg Hx    ? Amblyopia Neg Hx    ? Autoimmune Disease Neg Hx        ALLERGIES:  Allergies   Allergen Reactions   ? Amoxicillin-Pot Clavulanate HIVES   ? Gemfibrozil HIVES   ? Trazodone HALLUCINATIONS   ? Adhesive Tape (Rosins) RASH   ? Atorvastatin SEE COMMENTS  States it caused her to have a fatty liver.   Reaction: LFT elevation; Comment: LIPITOR  Reaction: LFT elevation; Comment: LIPITOR  Reaction: LFT elevation; Comment: LIPITOR  Reaction: LFT elevation; Comment: LIPITOR     ? Latex RASH   ? Bupropion SEE COMMENTS     Allergy recorded in SMS: WELLBUTRIN~Reactions: ABNORMAL SEIZUR   ? Cymbalta [Duloxetine] UNKNOWN   ? Levaquin [Levofloxacin] DIARRHEA       MEDICATIONS:  ? abatacept (ORENCIA CLICKJECT) 125 mg/mL injectable PEN Inject 1 mL under the skin every 7 days.   ? acetaminophen (TYLENOL EXTRA STRENGTH) 500 mg tablet Take two tablets by mouth every 6 hours as needed. Max of 4,000 mg of acetaminophen in 24 hours.  Indications: pain   ? aspirin 81 mg chewable tablet Chew 81 mg by mouth at bedtime daily.   ? blood-glucose sensor (DEXCOM G6 SENSOR MISC) Use  as directed.   ? calcium carbonate/vitamin D-3 (OSCAL-500+D) 1250 mg/200 unit tablet Take one tablet by mouth twice daily. Calcium Carb 1250mg  delivers 500mg  elemental Ca   ? celecoxib (CELEBREX) 100 mg capsule Take 100 mg by mouth twice daily.   ? DIPH/LIDO/ANTACID 1:1:1 (COMPOUND) Swish and Spit 5 mL by mouth as directed every 4 hours as needed.   ? dulaglutide (TRULICITY) 1.5 mg/0.5 mL injection pen Inject 1.5 mg under the skin every Wednesday.   ? ergocalciferol (VITAMIN D-2) 50,000 unit capsule Take 50,000 Units by mouth every 7 days.   ? ferrous sulfate (FEOSOL) 325 mg (65 mg iron) tablet Take 325 mg by mouth twice daily. Take on an empty stomach at least 1 hour before or 2 hours after food.   ? fexofenadine(+) (ALLEGRA) 180 mg tablet Take 180 mg by mouth daily.   ? fluocinolone acetonide oil (DERMOTIC OIL) 0.01 % otic drops INSTILL 3 DROPS INTO NOSE DAILY AS DIRECTED   ? folic acid (FOLVITE) 1 mg tablet Take four tablets by mouth daily.   ? furosemide (LASIX) 40 mg tablet Take 40 mg by mouth daily as needed.   ? insulin aspart (U-100) (NOVOLOG FLEXPEN U-100 INSULIN) 100 unit/mL (3 mL) PEN Inject 25 units under the skin prior to meals. Add sliding scale based on a target of 150 and ISF 1-10. For levels 501 and above, go to ER. Use up to 145 units per day.   ? insulin aspart U-100 (NOVOLOG U-100 INSULIN ASPART) 100 unit/mL injection Inject twelve Units under the skin three times daily with meals. Take 12 units with breakfast, 22 units with lunch, and 30 units with dinner.  Indications: type 2 diabetes mellitus (Patient taking differently: Inject 25 Units under the skin three times daily before meals. Plus sliding scale: if BG 150-159 = 1 unit, 160-169 = 2 units  Indications: type 2 diabetes mellitus)   ? insulin degludec (TRESIBA FLEXTOUCH U-200) 200 unit/mL (3 mL) suncutaneous PEN Use as directed. Inject up to 100 Units per day.   ? insulin degludec (TRESIBA FLEXTOUCH) 200 unit/mL (3 mL) injection PEN Inject eighty Units under the skin at bedtime daily. (Patient taking differently: Inject 100 Units under the skin at bedtime daily.)   ? Insulin Needles (Disposable) (NANO PEN NEEDLE) 32 gauge x 5/32 ndle Use 1 Each as directed before meals and at bedtime.   ? lactobacillus rhamnosus GG (CULTURELLE) 15 billion cell capsule Take 1 capsule by mouth as directed daily.   ? levoFLOXacin (LEVAQUIN) 750 mg tablet Take 750 mg by mouth daily.   ? levothyroxine (SYNTHROID) 88 mcg  tablet Take 88 mcg by mouth daily.   ? methotrexate sodium 2.5 mg tablet TAKE 8 TABLETS BY MOUTH EVERY FRIDAY   ? mirabegron (MYRBETRIQ) 50 mg ER tablet Take 50 mg by mouth daily.   ? nortriptyline (PAMELOR) 25 mg capsule Take 25 mg by mouth at bedtime daily.   ? NYSTOP 100,000 unit/gram topical powder Apply to clean, dry skin 2-3 times daily for yeast infection   ? oxyCODONE/acetaminophen (PERCOCET) 5/325 mg tablet Take one tablet to two tablets by mouth every 6 hours as needed   ? pantoprazole DR (PROTONIX) 40 mg tablet Take 40 mg by mouth twice daily.   ? polyethylene glycol 3350 (MIRALAX) 17 g packet Take one packet by mouth twice daily as needed.   ? potassium chloride (KLOR-CON 10) 10 mEq tablet Take 2 tablets by mouth every day as needed if taking furosemide for edema.   ? pramipexole (MIRAPEX) 0.75 mg tablet Take one tablet by mouth twice daily. Take one tablet at 2pm and one at bedtime.   ? rosuvastatin (CRESTOR) 20 mg tablet Take 1 tablet by mouth daily.   ? senna/docusate (SENOKOT-S) 8.6/50 mg tablet Take one tablet by mouth twice daily. (Patient taking differently: Take 2 tablets by mouth twice daily.)   ? venlafaxine XR (EFFEXOR XR) 150 mg capsule TAKE 1 CAPSULE BY MOUTH ONCE DAILY WITH  BREAKFAST   ? vitamins, multi w/minerals 9 mg iron-400 mcg tab Take 1 tablet by mouth daily.           PHYSICAL EXAM:  Vitals:    01/11/22 0854   BP: 112/76   Pulse: 90   Temp: 36.5 ?C (97.7 ?F)   Resp: 18   SpO2: 96%   PainSc: Nine   Weight: 98 kg (216 lb)   Height: 175.3 cm (5' 9)     body mass index is 31.9 kg/m?[name]     Physical Exam  Vitals and nursing note reviewed.   Constitutional:       Appearance: Normal appearance.   HENT:      Head: Normocephalic and atraumatic.      Right Ear: External ear normal.      Left Ear: External ear normal.      Nose: Nose normal.      Mouth/Throat:      Pharynx: Oropharynx is clear.   Eyes:      Extraocular Movements: Extraocular movements intact.      Conjunctiva/sclera: Conjunctivae normal.   Cardiovascular:      Rate and Rhythm: Normal rate.      Heart sounds: No murmur heard.  Pulmonary:      Effort: Pulmonary effort is normal. No respiratory distress.      Breath sounds: Normal breath sounds.   Abdominal:      General: There is no distension.   Musculoskeletal:         General: Swelling present. No tenderness or deformity.      Cervical back: Normal range of motion.   Skin:     General: Skin is warm.      Findings: No rash.   Neurological:      General: No focal deficit present.      Mental Status: She is alert and oriented to person, place, and time.   Psychiatric:         Mood and Affect: Mood normal.         Thought Content: Thought content normal.         Clinical Disease  Activity Index    Tender (DAS-28): 16 / 28   Swollen (DAS-28): 17 / 28   Patient Global: 60 mm   Provider Global: 50 mm    CDAI: 44   High Activity            LABS:  CBC w/Diff    Lab Results   Component Value Date/Time    WBC 4.4 (L) 10/15/2021 12:32 PM    RBC 4.11 10/15/2021 12:32 PM    HGB 12.0 10/15/2021 12:32 PM    HCT 36.0 10/15/2021 12:32 PM    MCV 87.5 10/15/2021 12:32 PM    MCH 29.3 10/15/2021 12:32 PM    MCHC 33.4 10/15/2021 12:32 PM    RDW 14.6 10/15/2021 12:32 PM    PLTCT 230 10/15/2021 12:32 PM    MPV 8.0 10/15/2021 12:32 PM    Lab Results   Component Value Date/Time    NEUT 41 10/15/2021 12:32 PM    ANC 1.82 10/15/2021 12:32 PM    LYMA 40 10/15/2021 12:32 PM    ALC 1.76 10/15/2021 12:32 PM    MONA 13 (H) 10/15/2021 12:32 PM    AMC 0.59 10/15/2021 12:32 PM    EOSA 5 10/15/2021 12:32 PM    AEC 0.23 10/15/2021 12:32 PM    BASA 1 10/15/2021 12:32 PM    ABC 0.05 10/15/2021 12:32 PM        Comprehensive Metabolic Profile    Lab Results   Component Value Date/Time    NA 138 10/15/2021 12:32 PM    K 4.2 10/15/2021 12:32 PM    CL 101 10/15/2021 12:32 PM    CO2 28 10/15/2021 12:32 PM    GAP 9 10/15/2021 12:32 PM    BUN 23 10/15/2021 12:32 PM    CR 0.69 10/15/2021 12:32 PM    GLU 172 (H) 10/15/2021 12:32 PM    GLU 90 11/24/2004 06:10 PM    Lab Results   Component Value Date/Time    CA 10.1 10/15/2021 12:32 PM    PO4 3.8 10/07/2020 04:07 AM    ALBUMIN 4.1 10/15/2021 12:32 PM    TOTPROT 6.7 10/15/2021 12:32 PM    ALKPHOS 99 10/15/2021 12:32 PM    AST 21 10/15/2021 12:32 PM    ALT 21 10/15/2021 12:32 PM    TOTBILI 0.3 10/15/2021 12:32 PM    GFR >60 11/11/2020 07:21 AM    GFRAA >60 11/11/2020 07:21 AM          IMAGING: Pertinent imaging reviewed    ASSESSMENT:  1. Rheumatoid arthritis involving multiple sites, unspecified whether rheumatoid factor present (HCC)    2. Hypothyroidism, unspecified type    3. History of colonic diverticulitis    4. High risk medication use    5. Immunosuppressed status (HCC)    6. Insulin dependent type 2 diabetes mellitus (HCC)    7. Primary osteoarthritis involving multiple joints    8. Obstructive sleep apnea    9. Cervical stenosis of spine        IMPRESSION:   Hailey Rodgers is a 71 year old Caucasian female with double positive (rheumatoid factor >1200 and CCP antibody 230) rheumatoid arthritis who is presenting today for follow-up.  Presents with high disease activity today based on exam.  She has synovitis of bilateral MCPs and PIP joints, and left wrist joint. she feels Orencia provided more benefit than Enbrel, however she has not had a lot of time to take it to see its full benefit.  According  to the patient at most she was able to take it for least a good 8 weeks prior to developing UTI sepsis.  Patient denies having recurrent severe infections prior to her recent hospitalization.    Reviewed CT chest abdomen and pelvis that was done at her recent hospital admission at outside hospital which suggest pulmonary fibrosis or splenomegaly.  She also had leukocytosis and did not have any neutropenia during that hospitalization.    PLAN:   -will give Kenalog IM 40mg  x 1 today for RA flare.  -resume Orencia 125 mg SQ once weekly and MTX 20mg  PO once weekly once she finishes her antibiotic treatment.  Recommended against giving both medications in 1 day, and instead on different days.  -We will check CBC, serum creatinine and ALTs today.  -Once she is recovered she will contact her primary care provider getting Shingrix vaccine.  -Return to clinic in 3 months.    Patient seen and discussed with Dr. Maxwell Caul.  Thank you for allowing [place] to participate in the care of this patient.   Marti Sleigh, MD  Rheumatology Fellow, PGY4  [facility]  Division of Rheumatology  [address]  [place], [place] [address]       Dictation was used to fulfill a portion of this documentation. Every effort was made to correct any grammatic errors or spelling however they may exist after extensive review. Please feel free to reach out to me for any questions or clarifications.    Orders Placed This Encounter   ? CBC AND DIFF   ? CREATININE   ? ALT (SGPT)   ? triamcinolone acetonide (KENALOG-40) injection 40 mg     Patient Instructions   -you can try salon pas for your muscle spasms     Future Appointments   Date Time Provider Department Center   01/17/2022  1:00 PM Electa Sniff, PhD [facility] Psychiatry   03/18/2022  1:00 PM Dekeyser, Dirck L, OD [facility] Ophthalmolog   09/05/2022  1:10 PM Trish Mage, Jackqulyn Livings, MD [facility] Neurology     Visit Disposition     Dispositions    ? Return in about 3 months (around 04/11/2022) for In-Person.

## 2022-01-11 ENCOUNTER — Ambulatory Visit: Admit: 2022-01-11 | Discharge: 2022-01-11 | Payer: MEDICARE

## 2022-01-11 ENCOUNTER — Encounter: Admit: 2022-01-11 | Discharge: 2022-01-11 | Payer: MEDICARE

## 2022-01-11 DIAGNOSIS — E034 Atrophy of thyroid (acquired): Secondary | ICD-10-CM

## 2022-01-11 DIAGNOSIS — J40 Bronchitis, not specified as acute or chronic: Secondary | ICD-10-CM

## 2022-01-11 DIAGNOSIS — R54 Age-related physical debility: Secondary | ICD-10-CM

## 2022-01-11 DIAGNOSIS — J309 Allergic rhinitis, unspecified: Secondary | ICD-10-CM

## 2022-01-11 DIAGNOSIS — H547 Unspecified visual loss: Secondary | ICD-10-CM

## 2022-01-11 DIAGNOSIS — E669 Obesity, unspecified: Secondary | ICD-10-CM

## 2022-01-11 DIAGNOSIS — Z79899 Other long term (current) drug therapy: Secondary | ICD-10-CM

## 2022-01-11 DIAGNOSIS — R29898 Other symptoms and signs involving the musculoskeletal system: Secondary | ICD-10-CM

## 2022-01-11 DIAGNOSIS — R3989 Other symptoms and signs involving the genitourinary system: Secondary | ICD-10-CM

## 2022-01-11 DIAGNOSIS — G4733 Obstructive sleep apnea (adult) (pediatric): Secondary | ICD-10-CM

## 2022-01-11 DIAGNOSIS — K5790 Diverticulosis of intestine, part unspecified, without perforation or abscess without bleeding: Secondary | ICD-10-CM

## 2022-01-11 DIAGNOSIS — K760 Fatty (change of) liver, not elsewhere classified: Secondary | ICD-10-CM

## 2022-01-11 DIAGNOSIS — E119 Type 2 diabetes mellitus without complications: Secondary | ICD-10-CM

## 2022-01-11 DIAGNOSIS — M1990 Unspecified osteoarthritis, unspecified site: Secondary | ICD-10-CM

## 2022-01-11 DIAGNOSIS — F3341 Major depressive disorder, recurrent, in partial remission: Secondary | ICD-10-CM

## 2022-01-11 DIAGNOSIS — R911 Solitary pulmonary nodule: Secondary | ICD-10-CM

## 2022-01-11 DIAGNOSIS — R4189 Other symptoms and signs involving cognitive functions and awareness: Secondary | ICD-10-CM

## 2022-01-11 DIAGNOSIS — M48061 Spinal stenosis, lumbar region without neurogenic claudication: Secondary | ICD-10-CM

## 2022-01-11 DIAGNOSIS — M069 Rheumatoid arthritis, unspecified: Secondary | ICD-10-CM

## 2022-01-11 DIAGNOSIS — M4802 Spinal stenosis, cervical region: Secondary | ICD-10-CM

## 2022-01-11 DIAGNOSIS — G47419 Narcolepsy without cataplexy: Secondary | ICD-10-CM

## 2022-01-11 DIAGNOSIS — R569 Unspecified convulsions: Secondary | ICD-10-CM

## 2022-01-11 DIAGNOSIS — G479 Sleep disorder, unspecified: Secondary | ICD-10-CM

## 2022-01-11 DIAGNOSIS — E039 Hypothyroidism, unspecified: Secondary | ICD-10-CM

## 2022-01-11 DIAGNOSIS — G629 Polyneuropathy, unspecified: Secondary | ICD-10-CM

## 2022-01-11 DIAGNOSIS — R413 Other amnesia: Secondary | ICD-10-CM

## 2022-01-11 DIAGNOSIS — E785 Hyperlipidemia, unspecified: Secondary | ICD-10-CM

## 2022-01-11 DIAGNOSIS — Z8719 Personal history of other diseases of the digestive system: Secondary | ICD-10-CM

## 2022-01-11 DIAGNOSIS — M159 Polyosteoarthritis, unspecified: Secondary | ICD-10-CM

## 2022-01-11 DIAGNOSIS — U071 COVID-19 virus infection: Secondary | ICD-10-CM

## 2022-01-11 DIAGNOSIS — R32 Unspecified urinary incontinence: Secondary | ICD-10-CM

## 2022-01-11 DIAGNOSIS — D849 Immunodeficiency, unspecified: Secondary | ICD-10-CM

## 2022-01-11 DIAGNOSIS — J45909 Unspecified asthma, uncomplicated: Secondary | ICD-10-CM

## 2022-01-11 DIAGNOSIS — E1165 Type 2 diabetes mellitus with hyperglycemia: Secondary | ICD-10-CM

## 2022-01-11 DIAGNOSIS — K219 Gastro-esophageal reflux disease without esophagitis: Secondary | ICD-10-CM

## 2022-01-11 DIAGNOSIS — E7849 Other hyperlipidemia: Secondary | ICD-10-CM

## 2022-01-11 LAB — CBC AND DIFF
ABSOLUTE BASO COUNT: 0 K/UL (ref 0–0.20)
ABSOLUTE EOS COUNT: 0.3 K/UL (ref 0–0.45)
ABSOLUTE LYMPH COUNT: 2.1 K/UL (ref 1.0–4.8)
ABSOLUTE MONO COUNT: 0.8 K/UL (ref 0–0.80)
ABSOLUTE NEUTROPHIL: 5.4 K/UL (ref 1.8–7.0)
BASOPHILS %: 1 % (ref 0–2)
EOSINOPHILS %: 4 % (ref 0–5)
HEMATOCRIT: 37 % (ref >60)
LYMPHOCYTES %: 24 % (ref 24–44)
MCH: 27 pg (ref 26–34)
MCHC: 32 g/dL (ref 32.0–36.0)
MCV: 85 FL (ref 80–100)
MONOCYTES %: 9 % (ref 4–12)
MPV: 8 FL (ref 7–11)
NEUTROPHILS %: 62 % (ref 41–77)
PLATELET COUNT: 301 K/UL (ref 150–400)
RBC COUNT: 4.3 M/UL (ref 4.0–5.0)
RDW: 14 % (ref 11–15)
WBC COUNT: 8.8 K/UL (ref 4.5–11.0)

## 2022-01-11 LAB — CREATININE: CREATININE: 0.7 mg/dL (ref 0.4–1.00)

## 2022-01-11 LAB — ALT (SGPT): ALT: 38 U/L (ref 7–56)

## 2022-01-11 MED ORDER — TRIAMCINOLONE ACETONIDE 40 MG/ML IJ SUSP
40 mg | Freq: Once | INTRAMUSCULAR | 0 refills | Status: CP
Start: 2022-01-11 — End: ?
  Administered 2022-01-11: 16:00:00 40 mg via INTRAMUSCULAR

## 2022-01-11 NOTE — Progress Notes
Currently holding abatacept (Orencia). Will complete medication reassessment in 3 months once she is stable and back on the medication

## 2022-01-11 NOTE — Patient Instructions
-  you can try salon pas for your muscle spasms

## 2022-01-11 NOTE — Telephone Encounter
Replaced standing lab orders.

## 2022-01-14 ENCOUNTER — Encounter: Admit: 2022-01-14 | Discharge: 2022-01-14 | Payer: MEDICARE

## 2022-01-17 ENCOUNTER — Encounter: Admit: 2022-01-17 | Discharge: 2022-01-17 | Payer: MEDICARE

## 2022-02-06 ENCOUNTER — Encounter: Admit: 2022-02-06 | Discharge: 2022-02-06 | Payer: MEDICARE

## 2022-02-06 MED FILL — FOLIC ACID 1 MG PO TAB: 1 mg | ORAL | 30 days supply | Qty: 120 | Fill #6 | Status: CP

## 2022-02-12 ENCOUNTER — Encounter: Admit: 2022-02-12 | Discharge: 2022-02-12 | Payer: MEDICARE

## 2022-02-14 ENCOUNTER — Encounter: Admit: 2022-02-14 | Discharge: 2022-02-14 | Payer: MEDICARE

## 2022-02-14 MED ORDER — INSULIN DEGLUDEC 200 UNIT/ML (3 ML) SC INPN
SUBCUTANEOUS | 2 refills | 30.00000 days | Status: AC
Start: 2022-02-14 — End: ?
  Filled 2022-02-18: qty 45, 90d supply, fill #1

## 2022-02-14 MED ORDER — INSULIN DEGLUDEC 200 UNIT/ML (3 ML) SC INPN
100 [IU] | Freq: Every day | SUBCUTANEOUS | 2 refills
Start: 2022-02-14 — End: ?

## 2022-02-15 ENCOUNTER — Encounter: Admit: 2022-02-15 | Discharge: 2022-02-15 | Payer: MEDICARE

## 2022-02-15 NOTE — Telephone Encounter
Order for d/c services with dme placed per protocol   DME: [facility]  Will continue to follow up.    Order for CPAP 10cm H2O placed per protocol   DME: [facility]  Will continue to follow up.

## 2022-02-17 ENCOUNTER — Encounter: Admit: 2022-02-17 | Discharge: 2022-02-17 | Payer: MEDICARE

## 2022-02-18 ENCOUNTER — Encounter: Admit: 2022-02-18 | Discharge: 2022-02-18 | Payer: MEDICARE

## 2022-02-18 NOTE — Progress Notes
The Prior Authorization Renewal for Hailey Rodgers for Hailey Rodgers has been submitted. Will continue to follow.     The Prior Authorization for Hailey Rodgers was approved for Hailey Rodgers from 02.27.2023 to 12.31.2023.  The copay is $0.00.  The PA authorization number is [id number].    [place] Patient Advocate  [phone]

## 2022-02-19 MED FILL — ORENCIA CLICKJECT 125 MG/ML SC ATIN: 125 mg/mL | SUBCUTANEOUS | 28 days supply | Qty: 4 | Fill #4 | Status: AC

## 2022-03-04 ENCOUNTER — Encounter: Admit: 2022-03-04 | Discharge: 2022-03-04 | Payer: MEDICARE

## 2022-03-04 ENCOUNTER — Emergency Department
Admit: 2022-03-04 | Discharge: 2022-03-04 | Disposition: A | Discharge status: Home / Self Care | Payer: MEDICARE | Attending: Emergency Medical Services

## 2022-03-04 DIAGNOSIS — T50901A Poisoning by unspecified drugs, medicaments and biological substances, accidental (unintentional), initial encounter: Secondary | ICD-10-CM

## 2022-03-04 LAB — POC GLUCOSE
POC GLUCOSE: 135 mg/dL — ABNORMAL HIGH (ref 70–100)
POC GLUCOSE: 150 mg/dL — ABNORMAL HIGH (ref 70–100)
POC GLUCOSE: 122 mg/dL — ABNORMAL HIGH (ref 70–100)
POC GLUCOSE: 120 mg/dL — ABNORMAL HIGH (ref 70–100)

## 2022-03-04 LAB — COMPREHENSIVE METABOLIC PANEL: SODIUM: 138 MMOL/L — ABNORMAL LOW (ref 137–147)

## 2022-03-04 MED ORDER — KETOROLAC 15 MG/ML IJ SOLN
15 mg | Freq: Once | INTRAVENOUS | 0 refills | Status: CP
Start: 2022-03-04 — End: ?
  Administered 2022-03-04: 13:00:00 15 mg via INTRAVENOUS

## 2022-03-04 NOTE — ED Notes
Pt appears flushed in her face, daughter at bedside states it is new. Pt's temperature checked at this time.

## 2022-03-04 NOTE — ED Notes
Per lab, need new mint top and order.

## 2022-03-04 NOTE — ED Provider Notes
Hailey Rodgers is a 71 y.o. female.    Chief Complaint:  Chief Complaint   Patient presents with   ? Medication Problem     Pt reports taking daughter's insulin instead of her own; also c/o bilat hand pain       History of Present Illness:  Patient is a 72 year old female with past medical history of diabetes mellitus, hyperlipidemia, asthma, PNES, fatty liver disease, narcolepsy, type 2 diabetes mellitus presenting to emergency department with accidental insulin injection.  Normally daughter gives injections but daughter has not been home due to health issues with her father. This morning, patient was supposed to take Orencia for RA but instead grabbed her daughter's mounjaro and administered 10mg  yesterday morning at 5-6am. Daughter only realized that patient had taken the medication at midnight which is prompted her to bring patient into the emergency department for evaluation.  Patient denies any diaphoresis, jitteriness, shakiness, altered mental status and patient agrees with this.  Daughter otherwise feeling well without any significant nausea or vomiting or diarrhea currently.  Daughter also notes that patient is in a rheumatoid arthritis flare currently which she is treating at home.  Patient reports her arthralgias in hands and swelling in hands currently consistent with her rheumatoid arthritis          Review of Systems:  Review of Systems  ROS as noted in HPI    Allergies:  Amoxicillin-pot clavulanate, Gemfibrozil, Trazodone, Adhesive tape (rosins), Atorvastatin, Latex, Bupropion, Cymbalta [duloxetine], and Levaquin [levofloxacin]    Past Medical History:  Medical History:   Diagnosis Date   ? Age-related physical debility 10/06/2020   ? Allergic rhinitis    ? Arthritis    ? Asthma    ? Bronchitis    ? Cervical stenosis of spinal canal    ? COVID-19 virus infection 10/06/2020    06/2020   ? Disorganized thinking    ? Diverticulosis 10/06/2020   ? DM (diabetes mellitus) (HCC)    ? Dyslipidemia    ? Fatty liver disease, nonalcoholic    ? Gastroesophageal reflux disease without esophagitis 10/06/2020   ? GERD (gastroesophageal reflux disease)    ? Hypothyroidism    ? 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 Leg weakness    ? Lumbar stenosis    ? Lung nodule 10/06/2020    10/06/2020 5. ?Right upper lobe groundglass nodule measuring 5 mm which may be  infectious or inflammatory. Multiple additional sub-5 mm nodules  bilaterally, most lymph nodes or granulomas. Follow-up CT in 12 months  recommended for further evaluation if clinically indicated.    ? Memory loss    ? Narcolepsy    ? Obesity    ? OSA (obstructive sleep apnea)    ? Other hyperlipidemia 10/06/2020   ? Peripheral neuropathy    ? Polypharmacy 01/04/2021   ? Pseudoseizures (HCC)    ? 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 Rheumatoid arthritis (HCC) 10/06/2020   ? Sleep disorder     Sleep Apnea & Narcolepsy   ? Type 2 diabetes mellitus with hyperglycemia, with long-term current use of insulin (HCC) 10/06/2020   ? Urinary incontinence    ? Urinary problem     urinary frequency   ? Vision decreased     diplopia       Past Surgical History:  Surgical History:   Procedure Laterality Date   ? HX ADENOIDECTOMY  1962   ? HX TONSILLECTOMY  1962   ? TUBAL LIGATION  1977   ? RECTAL SURGERY  1979    for rectal prolapse; bladder repair   ? CHOLECYSTECTOMY  1984   ? HYSTERECTOMY  2000   ? BREAST BIOPSY Right 2012   ? COLONOSCOPY  2016   ? CERVICAL 3 TO THORACIC 7 DECOMPRESSION AND FUSION Bilateral 07/18/2015    Performed by Storm Frisk[facility] MD at [facility] OR   ? BLEPHAROPLASTY     ? HERNIA REPAIR  1984/2007   ? SEPTOPLASTY  1986/2013   ? UPPER GASTROINTESTINAL ENDOSCOPY         Pertinent medical and surgical history reviewed    Social History:  Social History     Tobacco Use   ? Smoking status: Never   ? Smokeless tobacco: Never   Vaping Use   ? Vaping Use: Never used   Substance Use Topics   ? Alcohol use: No Alcohol/week: 0.0 standard drinks   ? Drug use: No     Social History     Substance and Sexual Activity   Drug Use No             Family History:  Family History   Problem Relation Age of Onset   ? Cancer Mother    ? Thyroid Disease Mother    ? Hypertension Mother    ? Cataract Mother    ? Cancer Father         pancreatic   ? Cancer Other    ? Stroke Paternal Grandfather    ? Cancer Paternal Aunt    ? Coronary Artery Disease Paternal Grandmother    ? Strabismus Neg Hx    ? Retinal Detachment Neg Hx    ? Neurologic Disorder Neg Hx    ? Macular Degen Neg Hx    ? Diabetes Neg Hx    ? Glaucoma Neg Hx    ? Blindness Neg Hx    ? Amblyopia Neg Hx    ? Autoimmune Disease Neg Hx        Vitals:  ED Vitals    Date and Time T BP P RR SPO2P SPO2 User   03/04/22 1012 36.6 ?C (97.9 ?F) -- -- -- -- -- MP   03/04/22 0800 -- -- 95 18 PER MINUTE 95 98 % NW   03/04/22 0730 -- 156/81 101 21 PER MINUTE -- -- NW   03/04/22 0659 36.5 ?C (97.7 ?F) -- -- -- -- -- [facility]   03/04/22 0551 37 ?C (98.6 ?F) 127/69 -- 16 PER MINUTE 99 99 % K[facility]          Physical Exam:  Physical Exam  Vitals and nursing note reviewed.   Constitutional:       Appearance: She is normal weight.   HENT:      Head: Normocephalic and atraumatic.   Eyes:      Extraocular Movements: Extraocular movements intact.      Conjunctiva/sclera: Conjunctivae normal.   Cardiovascular:      Rate and Rhythm: Normal rate and regular rhythm.   Pulmonary:      Effort: Pulmonary effort is normal.      Breath sounds: Normal breath sounds.   Abdominal:      General: There is no distension.      Palpations: Abdomen is soft.      Tenderness: There is no abdominal tenderness.   Musculoskeletal:         General: Normal range of motion.  Cervical back: Neck supple.      Comments: Swelling and redness noted to MCP joints in bilateral hands consistent with reported rheumatoid arthritis flare.  Patient with full range of motion of fingers, 2+ radial pulses bilaterally, appropriate sensation and strength.   Neurological:      Mental Status: She is oriented to person, place, and time. Mental status is at baseline.      Comments: Sleepy but easily arousable consistent with patient's baseline narcolepsy per daughter   Psychiatric:         Mood and Affect: Mood normal.         Behavior: Behavior normal.         Laboratory Results:  Labs Reviewed   POC GLUCOSE - Abnormal       Result Value Ref Range Status    Glucose, POC 150 (*) 70 - 100 MG/DL Final   POC GLUCOSE - Abnormal    Glucose, POC 135 (*) 70 - 100 MG/DL Final   POC GLUCOSE - Abnormal    Glucose, POC 122 (*) 70 - 100 MG/DL Final   POC GLUCOSE - Abnormal    Glucose, POC 120 (*) 70 - 100 MG/DL Final   URINE TIGER TOP TUBE   URINE CLEAR TOP TUBE   EXTRA URINE GRAY TOP   COMPREHENSIVE METABOLIC PANEL   COMPREHENSIVE METABOLIC PANEL     POC Glucose (Download): (!) 120    Radiology Interpretation:    No orders to display         EKG:      Medical Decision Making:  Hailey Rodgers is a 71 y.o. female who presents with chief complaint as listed above. Based on the history and presentation, the list of differential diagnoses considered included, but was not limited to, accidental medication misuse    ED Course  -vitals signs without acute instability.  -physical exam as noted above.     Labs:  -acutely unremarkable  -Repeat blood sugar checks normal.    Interventions:  -Discussed case with poison control and per their records they did not find any recorded overdoses and given that she is 24 hours out of injection felt that she did not need any additional monitoring and recommended supportive care at home and continue p.o. intake.  -Toradol administered for patient's RA pain    Disposition:  -Patient and patient's daughter comfortable with discharge home and close observation at home.  Patient tolerating p.o. intake well.  -Discussed results, answered questions, reviewed plan for discharge and follow up. Instructed to return to the ED immediately with new/concerning/worsening symptoms. Patient and/or patient's primary caregiver acknowledged understanding instructions.         Complexity of Problems Addressed  Patient's active diagnoses as well as contributing pre-existing medical problems include:  Clinical Impression   Accidental medication error, initial encounter     Evaluation performed for potential threat to life or bodily function during this visit given the initial differential diagnosis and clinical impression(s) as discussed previously in MDM/ED course.    Additional data reviewed:    ? History was obtained from an independent historian: Not in addition to what is mentioned above  ? Prior non-ED notes reviewed: Not in addition to what is mentioned above  ? Independent interpretation of diagnostic tests was performed by me: Not in addition to what is mentioned above  ? Patient presentation/management was discussed with the following qualified health care professionals and/or other relevant professionals: Not in addition to what is mentioned above  Risk evaluation:    ? Diagnosis or treatment of patient condition impacted by social determinant of health: None  ? Tests Considered but not performed due to clinical scoring (if not mentioned in ED course, aside from what is implied by clinical scores listed):   ? Rationale regarding whether admission or escalation of care considered if not performed (if not mentioned in ED course, aside from what is implied by clinical scores listed):     ED Scoring:                                Facility Administered Meds:  Medications   ketorolac (TORADOL) injection 15 mg (15 mg Intravenous Given 03/04/22 0749)       Clinical Impression:  Clinical Impression   Accidental medication error, initial encounter       Disposition/Follow up  ED Disposition     None        Lona Kettle, MD  [address]  [place] [address]  [phone]    Call in 1 day        Medications:  New Prescriptions    No medications on file Procedure Notes:  Procedures       Attestation / Supervision:      Daylene Posey MD  Emergency Medicine PGY-3

## 2022-03-04 NOTE — ED Notes
Discharge instructions reviewed with pt. Provider spoke with pt prior to discharge. Pt educated on follow up care. Pt verbalized understanding and has no questions or concerns. Pt agreeable to plan of care. A&Ox4, VSS, PIV removed, assisted to wheelchair and wheeled out to triage with all belongings. Pt provided lunch box and water prior to departure.

## 2022-03-04 NOTE — ED Notes
Pt's daughter hit call light, this RN went to check. Daughter sternal rubbing pt, pt unresponsive. This RN checked for a pulse, pulse noted in carotid and radial. This RN sternal rubbed, pt became responsive. This RN went to get glucose monitor and placed pt on tele.

## 2022-03-08 ENCOUNTER — Encounter: Admit: 2022-03-08 | Discharge: 2022-03-08 | Payer: MEDICARE

## 2022-03-12 ENCOUNTER — Encounter: Admit: 2022-03-12 | Discharge: 2022-03-12 | Payer: MEDICARE

## 2022-03-17 ENCOUNTER — Encounter: Admit: 2022-03-17 | Discharge: 2022-03-17 | Payer: MEDICARE

## 2022-03-18 ENCOUNTER — Encounter: Admit: 2022-03-18 | Discharge: 2022-03-18 | Payer: MEDICARE

## 2022-03-18 MED FILL — FOLIC ACID 1 MG PO TAB: 1 mg | ORAL | 30 days supply | Qty: 120 | Fill #7 | Status: AC

## 2022-03-18 MED FILL — ORENCIA CLICKJECT 125 MG/ML SC ATIN: 125 mg/mL | SUBCUTANEOUS | 28 days supply | Qty: 4 | Fill #5 | Status: AC

## 2022-03-22 ENCOUNTER — Encounter: Admit: 2022-03-22 | Discharge: 2022-03-22 | Payer: MEDICARE

## 2022-03-23 MED FILL — INSULIN ASPART 100 UNIT/ML SC FLEXPEN: 100 unit/mL (3 mL) | SUBCUTANEOUS | 21 days supply | Qty: 30 | Fill #4 | Status: CP

## 2022-04-04 ENCOUNTER — Encounter: Admit: 2022-04-04 | Discharge: 2022-04-04 | Payer: MEDICARE

## 2022-04-04 NOTE — Progress Notes
First attempt to call Hailey Rodgers for initial reassessment to verify compliance and assess tolerance of her specialty medications (Orencia). No answer. Left voicemail asking patient to return call to pharmacist at [phone]. MyChart message sent.    Tora Kindred, PHARMD

## 2022-04-08 NOTE — Patient Instructions
-   If you have any questions regarding your appointment or any concerns, please contact Rorey Bisson Austin-Ellis, RN at [phone].    - If you need to schedule or reschedule an appointment, please call [phone].     -For refills on medications, please have your pharmacy fax a refill authorization request form to our office at [phone]. Please allow at least 3 business days for refill requests.    - For any urgent issues after business hours, please call [phone] and have the on call pulmonary physician paged.

## 2022-04-11 ENCOUNTER — Encounter: Admit: 2022-04-11 | Discharge: 2022-04-11 | Payer: MEDICARE

## 2022-04-11 MED FILL — ORENCIA CLICKJECT 125 MG/ML SC ATIN: 125 mg/mL | SUBCUTANEOUS | 28 days supply | Qty: 4 | Fill #6 | Status: AC

## 2022-04-11 MED FILL — FOLIC ACID 1 MG PO TAB: 1 mg | ORAL | 30 days supply | Qty: 120 | Fill #8 | Status: AC

## 2022-04-12 ENCOUNTER — Encounter: Admit: 2022-04-12 | Discharge: 2022-04-12 | Payer: MEDICARE

## 2022-04-12 MED ORDER — INSULIN ASPART 100 UNIT/ML SC FLEXPEN
SUBCUTANEOUS | 4 refills
Start: 2022-04-12 — End: ?

## 2022-04-12 MED ORDER — INSULIN ASPART 100 UNIT/ML SC FLEXPEN
SUBCUTANEOUS | 4 refills | 30.00000 days | Status: AC
Start: 2022-04-12 — End: ?
  Filled 2022-04-12: qty 30, 25d supply, fill #1

## 2022-04-15 ENCOUNTER — Encounter: Admit: 2022-04-15 | Discharge: 2022-04-15 | Payer: MEDICARE

## 2022-04-15 ENCOUNTER — Ambulatory Visit: Admit: 2022-04-15 | Discharge: 2022-04-16 | Payer: MEDICARE

## 2022-04-15 DIAGNOSIS — G47419 Narcolepsy without cataplexy: Secondary | ICD-10-CM

## 2022-04-15 DIAGNOSIS — E669 Obesity, unspecified: Secondary | ICD-10-CM

## 2022-04-15 DIAGNOSIS — R413 Other amnesia: Secondary | ICD-10-CM

## 2022-04-15 DIAGNOSIS — R54 Age-related physical debility: Secondary | ICD-10-CM

## 2022-04-15 DIAGNOSIS — E7849 Other hyperlipidemia: Secondary | ICD-10-CM

## 2022-04-15 DIAGNOSIS — M069 Rheumatoid arthritis, unspecified: Secondary | ICD-10-CM

## 2022-04-15 DIAGNOSIS — R32 Unspecified urinary incontinence: Secondary | ICD-10-CM

## 2022-04-15 DIAGNOSIS — G629 Polyneuropathy, unspecified: Secondary | ICD-10-CM

## 2022-04-15 DIAGNOSIS — K760 Fatty (change of) liver, not elsewhere classified: Secondary | ICD-10-CM

## 2022-04-15 DIAGNOSIS — E039 Hypothyroidism, unspecified: Secondary | ICD-10-CM

## 2022-04-15 DIAGNOSIS — E785 Hyperlipidemia, unspecified: Secondary | ICD-10-CM

## 2022-04-15 DIAGNOSIS — J40 Bronchitis, not specified as acute or chronic: Secondary | ICD-10-CM

## 2022-04-15 DIAGNOSIS — K219 Gastro-esophageal reflux disease without esophagitis: Secondary | ICD-10-CM

## 2022-04-15 DIAGNOSIS — R4189 Other symptoms and signs involving cognitive functions and awareness: Secondary | ICD-10-CM

## 2022-04-15 DIAGNOSIS — R3989 Other symptoms and signs involving the genitourinary system: Secondary | ICD-10-CM

## 2022-04-15 DIAGNOSIS — M48061 Spinal stenosis, lumbar region without neurogenic claudication: Secondary | ICD-10-CM

## 2022-04-15 DIAGNOSIS — E1165 Type 2 diabetes mellitus with hyperglycemia: Secondary | ICD-10-CM

## 2022-04-15 DIAGNOSIS — R911 Solitary pulmonary nodule: Secondary | ICD-10-CM

## 2022-04-15 DIAGNOSIS — F3341 Major depressive disorder, recurrent, in partial remission: Secondary | ICD-10-CM

## 2022-04-15 DIAGNOSIS — Z79899 Other long term (current) drug therapy: Secondary | ICD-10-CM

## 2022-04-15 DIAGNOSIS — U071 COVID-19 virus infection: Secondary | ICD-10-CM

## 2022-04-15 DIAGNOSIS — J45909 Unspecified asthma, uncomplicated: Secondary | ICD-10-CM

## 2022-04-15 DIAGNOSIS — M1990 Unspecified osteoarthritis, unspecified site: Secondary | ICD-10-CM

## 2022-04-15 DIAGNOSIS — G479 Sleep disorder, unspecified: Secondary | ICD-10-CM

## 2022-04-15 DIAGNOSIS — J309 Allergic rhinitis, unspecified: Secondary | ICD-10-CM

## 2022-04-15 DIAGNOSIS — M4802 Spinal stenosis, cervical region: Secondary | ICD-10-CM

## 2022-04-15 DIAGNOSIS — E034 Atrophy of thyroid (acquired): Secondary | ICD-10-CM

## 2022-04-15 DIAGNOSIS — R29898 Other symptoms and signs involving the musculoskeletal system: Secondary | ICD-10-CM

## 2022-04-15 DIAGNOSIS — E119 Type 2 diabetes mellitus without complications: Secondary | ICD-10-CM

## 2022-04-15 DIAGNOSIS — F445 Conversion disorder with seizures or convulsions: Secondary | ICD-10-CM

## 2022-04-15 DIAGNOSIS — K5790 Diverticulosis of intestine, part unspecified, without perforation or abscess without bleeding: Secondary | ICD-10-CM

## 2022-04-15 DIAGNOSIS — G4733 Obstructive sleep apnea (adult) (pediatric): Secondary | ICD-10-CM

## 2022-04-15 DIAGNOSIS — H547 Unspecified visual loss: Secondary | ICD-10-CM

## 2022-04-15 NOTE — Telephone Encounter
I ordered the CT chest. Please have them call radiology to schedule.    How long has she been on Orencia? Would she want to discuss her RA medications on her upcoming follow up since she is going to see me 04/24/22?

## 2022-04-16 ENCOUNTER — Encounter: Admit: 2022-04-16 | Discharge: 2022-04-16 | Payer: MEDICARE

## 2022-04-16 DIAGNOSIS — G629 Polyneuropathy, unspecified: Secondary | ICD-10-CM

## 2022-04-16 DIAGNOSIS — R29898 Other symptoms and signs involving the musculoskeletal system: Secondary | ICD-10-CM

## 2022-04-16 DIAGNOSIS — K219 Gastro-esophageal reflux disease without esophagitis: Secondary | ICD-10-CM

## 2022-04-16 DIAGNOSIS — G47419 Narcolepsy without cataplexy: Secondary | ICD-10-CM

## 2022-04-16 DIAGNOSIS — R3989 Other symptoms and signs involving the genitourinary system: Secondary | ICD-10-CM

## 2022-04-16 DIAGNOSIS — M069 Rheumatoid arthritis, unspecified: Secondary | ICD-10-CM

## 2022-04-16 DIAGNOSIS — H547 Unspecified visual loss: Secondary | ICD-10-CM

## 2022-04-16 DIAGNOSIS — F3341 Major depressive disorder, recurrent, in partial remission: Secondary | ICD-10-CM

## 2022-04-16 DIAGNOSIS — E039 Hypothyroidism, unspecified: Secondary | ICD-10-CM

## 2022-04-16 DIAGNOSIS — E669 Obesity, unspecified: Secondary | ICD-10-CM

## 2022-04-16 DIAGNOSIS — E7849 Other hyperlipidemia: Secondary | ICD-10-CM

## 2022-04-16 DIAGNOSIS — M1990 Unspecified osteoarthritis, unspecified site: Secondary | ICD-10-CM

## 2022-04-16 DIAGNOSIS — R54 Age-related physical debility: Secondary | ICD-10-CM

## 2022-04-16 DIAGNOSIS — E119 Type 2 diabetes mellitus without complications: Secondary | ICD-10-CM

## 2022-04-16 DIAGNOSIS — E034 Atrophy of thyroid (acquired): Secondary | ICD-10-CM

## 2022-04-16 DIAGNOSIS — R32 Unspecified urinary incontinence: Secondary | ICD-10-CM

## 2022-04-16 DIAGNOSIS — K760 Fatty (change of) liver, not elsewhere classified: Secondary | ICD-10-CM

## 2022-04-16 DIAGNOSIS — R413 Other amnesia: Secondary | ICD-10-CM

## 2022-04-16 DIAGNOSIS — M48061 Spinal stenosis, lumbar region without neurogenic claudication: Secondary | ICD-10-CM

## 2022-04-16 DIAGNOSIS — R911 Solitary pulmonary nodule: Secondary | ICD-10-CM

## 2022-04-16 DIAGNOSIS — K5790 Diverticulosis of intestine, part unspecified, without perforation or abscess without bleeding: Secondary | ICD-10-CM

## 2022-04-16 DIAGNOSIS — R4189 Other symptoms and signs involving cognitive functions and awareness: Secondary | ICD-10-CM

## 2022-04-16 DIAGNOSIS — M4802 Spinal stenosis, cervical region: Secondary | ICD-10-CM

## 2022-04-16 DIAGNOSIS — E1165 Type 2 diabetes mellitus with hyperglycemia: Secondary | ICD-10-CM

## 2022-04-16 DIAGNOSIS — U071 COVID-19 virus infection: Secondary | ICD-10-CM

## 2022-04-16 DIAGNOSIS — F445 Conversion disorder with seizures or convulsions: Secondary | ICD-10-CM

## 2022-04-16 DIAGNOSIS — J45909 Unspecified asthma, uncomplicated: Secondary | ICD-10-CM

## 2022-04-16 DIAGNOSIS — G4733 Obstructive sleep apnea (adult) (pediatric): Secondary | ICD-10-CM

## 2022-04-16 DIAGNOSIS — J309 Allergic rhinitis, unspecified: Secondary | ICD-10-CM

## 2022-04-16 DIAGNOSIS — E785 Hyperlipidemia, unspecified: Secondary | ICD-10-CM

## 2022-04-16 DIAGNOSIS — G479 Sleep disorder, unspecified: Secondary | ICD-10-CM

## 2022-04-16 DIAGNOSIS — J40 Bronchitis, not specified as acute or chronic: Secondary | ICD-10-CM

## 2022-04-16 DIAGNOSIS — Z79899 Other long term (current) drug therapy: Secondary | ICD-10-CM

## 2022-04-22 ENCOUNTER — Encounter: Admit: 2022-04-22 | Discharge: 2022-04-22 | Payer: MEDICARE

## 2022-04-24 ENCOUNTER — Encounter: Admit: 2022-04-24 | Discharge: 2022-04-24 | Payer: MEDICARE

## 2022-04-24 ENCOUNTER — Ambulatory Visit: Admit: 2022-04-24 | Discharge: 2022-04-24 | Payer: MEDICARE

## 2022-04-24 DIAGNOSIS — M069 Rheumatoid arthritis, unspecified: Secondary | ICD-10-CM

## 2022-04-24 DIAGNOSIS — I7300 Raynaud's syndrome without gangrene: Secondary | ICD-10-CM

## 2022-04-24 DIAGNOSIS — M0579 Rheumatoid arthritis with rheumatoid factor of multiple sites without organ or systems involvement: Secondary | ICD-10-CM

## 2022-04-24 DIAGNOSIS — Z5181 Encounter for therapeutic drug level monitoring: Secondary | ICD-10-CM

## 2022-04-24 DIAGNOSIS — M7989 Other specified soft tissue disorders: Secondary | ICD-10-CM

## 2022-04-24 DIAGNOSIS — M791 Myalgia, unspecified site: Secondary | ICD-10-CM

## 2022-04-24 DIAGNOSIS — I5030 Unspecified diastolic (congestive) heart failure: Secondary | ICD-10-CM

## 2022-04-24 DIAGNOSIS — Z79899 Other long term (current) drug therapy: Secondary | ICD-10-CM

## 2022-04-24 LAB — CBC AND DIFF
ABSOLUTE BASO COUNT: 0 K/UL (ref 0–0.20)
ABSOLUTE EOS COUNT: 0.3 K/UL (ref 0–0.45)
ABSOLUTE LYMPH COUNT: 1.6 K/UL (ref 1.0–4.8)
ABSOLUTE MONO COUNT: 0.5 K/UL (ref 0–0.80)
ABSOLUTE NEUTROPHIL: 6.1 K/UL (ref 1.8–7.0)
BASOPHILS %: 1 % (ref 0–2)
EOSINOPHILS %: 3 % (ref 0–5)
HEMATOCRIT: 39 % (ref 36–45)
HEMOGLOBIN: 13 g/dL (ref 12.0–15.0)
LYMPHOCYTES %: 19 % — ABNORMAL LOW (ref 24–44)
MCH: 28 pg (ref 26–34)
MCHC: 33 g/dL (ref 32.0–36.0)
MCV: 85 FL (ref 80–100)
MONOCYTES %: 7 % (ref 4–12)
MPV: 8.3 FL (ref 7–11)
NEUTROPHILS %: 70 % (ref 41–77)
PLATELET COUNT: 227 K/UL (ref 150–400)
RBC COUNT: 4.5 M/UL (ref 4.0–5.0)
RDW: 14 % (ref 11–15)
WBC COUNT: 8.6 K/UL (ref 4.5–11.0)

## 2022-04-24 LAB — ANTI SSA ANTI SSB AB

## 2022-04-24 LAB — CREATININE
CREATININE: 0.7 mg/dL (ref 0.4–1.00)
EGFR: >60 mL/min (ref >60)

## 2022-04-24 LAB — ALT (SGPT): ALT: 25 U/L (ref 7–56)

## 2022-04-24 LAB — CREATINE KINASE-CPK: CK TOTAL: 155 U/L (ref 21–215)

## 2022-04-24 LAB — JO 1 ANTIBODIES

## 2022-04-24 MED ORDER — ABATACEPT IVPB
750 mg | Freq: Once | INTRAVENOUS | 0 refills
Start: 2022-04-24 — End: ?

## 2022-04-24 MED ORDER — METHOTREXATE SODIUM 2.5 MG PO TAB
20 mg | ORAL_TABLET | ORAL | 0 refills | Status: AC
Start: 2022-04-24 — End: ?
  Filled 2022-04-24: qty 96, 84d supply, fill #1

## 2022-04-24 NOTE — Progress Notes
@  Marcos, please let patient know her venous Dopplers did not show any evidence of DVT for both legs.  Please have them look out for their cardiology appointment for her leg swelling that could be related to her heart.    Lateral lower extremity venous Dopplers 04/24/2022  -No evidence of femoral popliteal deep vein thrombosis bilaterally  -Limited calf vein survey which demonstrates no abnormality bilaterally

## 2022-04-25 ENCOUNTER — Encounter: Admit: 2022-04-25 | Discharge: 2022-04-25 | Payer: MEDICARE

## 2022-04-25 NOTE — Telephone Encounter
Please let patient know to hold methotrexatre and abatacept if there concerns for infectious colitis.

## 2022-04-26 ENCOUNTER — Encounter: Admit: 2022-04-26 | Discharge: 2022-04-26 | Payer: MEDICARE

## 2022-04-26 DIAGNOSIS — G4733 Obstructive sleep apnea (adult) (pediatric): Secondary | ICD-10-CM

## 2022-04-29 ENCOUNTER — Encounter: Admit: 2022-04-29 | Discharge: 2022-04-29 | Payer: MEDICARE

## 2022-04-29 NOTE — Telephone Encounter
Phone call to pt to discuss provider notes. Rn spoke with patient's daughter, Lyndel Pleasure.  Katheryn verbalizes understanding of lab notes.   Yesenia has post discharge appointment with PCP tomorrow and will follow up on Crestor.   Pt had colitis when labs were drawn. Pt has follow up with GI on Thursday   Has cardiology appt on Thursday as well     Pt daughter questioning when to resume medications. RN informed her that pt can resume after antibiotics completed and pt is feeling back to baseline.   Flagyl and septer will finish towards end of week and pt still had an episode of diarrhea yesterday.

## 2022-04-29 NOTE — Telephone Encounter
04-29-2022 Requested records from Dr Rivka Barbara, (F) [phone]  (P) [phone], clp    ,

## 2022-04-29 NOTE — Telephone Encounter
-----   Message from Emmaline Life, MD sent at 04/26/2022  4:01 PM CDT -----  '@'$ , please let patient know labs did not show any evidence of kidney or liver side effects from methotrexate.  Muscle enzymes are also within normal limits.  Please have them talk to her PCP regarding her cholesterol medicine and see if she experiences improvement after temporarily holding it since she was having some muscle achiness last visit.  Her ANA came back positive (which had been in the past) however she is not having any symptoms of lupus and the rest of the antibodies that could be associated with a positive ANA were normal.  The autoantibodies that we checked to make sure she has no inflammatory muscle disease also came back negative specially she was having some muscle achiness and some dry hands.  Please have them reach out for any questions or concerns.    04/24/22  -CBC w diff: WBC 8.6, hb 13.1, platelets 227, normal ANC and ALC  -normal ALT and serum creatinine, G6PD levels  -ANA 320 nucleolar with negative anti smith/RNP/Jo1/SSA/SSB/centromere/Scl70/RNA pol III, normal C3 and C4, Undetectable anti ds DNA  -normal CK, aldolase

## 2022-04-30 ENCOUNTER — Encounter: Admit: 2022-04-30 | Discharge: 2022-04-30 | Payer: MEDICARE

## 2022-04-30 MED ORDER — VENLAFAXINE 150 MG PO CP24
150 mg | ORAL_CAPSULE | Freq: Every day | ORAL | 3 refills | Status: AC
Start: 2022-04-30 — End: ?
  Filled 2022-04-30 – 2022-05-01 (×2): qty 90, 90d supply, fill #1

## 2022-05-01 ENCOUNTER — Encounter: Admit: 2022-05-01 | Discharge: 2022-05-01 | Payer: MEDICARE

## 2022-05-02 ENCOUNTER — Encounter: Admit: 2022-05-02 | Discharge: 2022-05-02 | Payer: MEDICARE

## 2022-05-02 DIAGNOSIS — G629 Polyneuropathy, unspecified: Secondary | ICD-10-CM

## 2022-05-02 DIAGNOSIS — E119 Type 2 diabetes mellitus without complications: Secondary | ICD-10-CM

## 2022-05-02 DIAGNOSIS — F445 Conversion disorder with seizures or convulsions: Secondary | ICD-10-CM

## 2022-05-02 DIAGNOSIS — E039 Hypothyroidism, unspecified: Secondary | ICD-10-CM

## 2022-05-02 DIAGNOSIS — R54 Age-related physical debility: Secondary | ICD-10-CM

## 2022-05-02 DIAGNOSIS — Z136 Encounter for screening for cardiovascular disorders: Secondary | ICD-10-CM

## 2022-05-02 DIAGNOSIS — R413 Other amnesia: Secondary | ICD-10-CM

## 2022-05-02 DIAGNOSIS — Z8679 Personal history of other diseases of the circulatory system: Secondary | ICD-10-CM

## 2022-05-02 DIAGNOSIS — R29898 Other symptoms and signs involving the musculoskeletal system: Secondary | ICD-10-CM

## 2022-05-02 DIAGNOSIS — J40 Bronchitis, not specified as acute or chronic: Secondary | ICD-10-CM

## 2022-05-02 DIAGNOSIS — E7849 Other hyperlipidemia: Secondary | ICD-10-CM

## 2022-05-02 DIAGNOSIS — J309 Allergic rhinitis, unspecified: Secondary | ICD-10-CM

## 2022-05-02 DIAGNOSIS — G4733 Obstructive sleep apnea (adult) (pediatric): Secondary | ICD-10-CM

## 2022-05-02 DIAGNOSIS — E034 Atrophy of thyroid (acquired): Secondary | ICD-10-CM

## 2022-05-02 DIAGNOSIS — R911 Solitary pulmonary nodule: Secondary | ICD-10-CM

## 2022-05-02 DIAGNOSIS — Z79899 Other long term (current) drug therapy: Secondary | ICD-10-CM

## 2022-05-02 DIAGNOSIS — R4189 Other symptoms and signs involving cognitive functions and awareness: Secondary | ICD-10-CM

## 2022-05-02 DIAGNOSIS — M4802 Spinal stenosis, cervical region: Secondary | ICD-10-CM

## 2022-05-02 DIAGNOSIS — K5790 Diverticulosis of intestine, part unspecified, without perforation or abscess without bleeding: Secondary | ICD-10-CM

## 2022-05-02 DIAGNOSIS — K760 Fatty (change of) liver, not elsewhere classified: Secondary | ICD-10-CM

## 2022-05-02 DIAGNOSIS — R55 Syncope and collapse: Secondary | ICD-10-CM

## 2022-05-02 DIAGNOSIS — M1990 Unspecified osteoarthritis, unspecified site: Secondary | ICD-10-CM

## 2022-05-02 DIAGNOSIS — F3341 Major depressive disorder, recurrent, in partial remission: Secondary | ICD-10-CM

## 2022-05-02 DIAGNOSIS — H547 Unspecified visual loss: Secondary | ICD-10-CM

## 2022-05-02 DIAGNOSIS — G479 Sleep disorder, unspecified: Secondary | ICD-10-CM

## 2022-05-02 DIAGNOSIS — I2510 Atherosclerotic heart disease of native coronary artery without angina pectoris: Secondary | ICD-10-CM

## 2022-05-02 DIAGNOSIS — E785 Hyperlipidemia, unspecified: Secondary | ICD-10-CM

## 2022-05-02 DIAGNOSIS — M48061 Spinal stenosis, lumbar region without neurogenic claudication: Secondary | ICD-10-CM

## 2022-05-02 DIAGNOSIS — R3989 Other symptoms and signs involving the genitourinary system: Secondary | ICD-10-CM

## 2022-05-02 DIAGNOSIS — U071 COVID-19 virus infection: Secondary | ICD-10-CM

## 2022-05-02 DIAGNOSIS — E782 Mixed hyperlipidemia: Secondary | ICD-10-CM

## 2022-05-02 DIAGNOSIS — I10 Essential (primary) hypertension: Secondary | ICD-10-CM

## 2022-05-02 DIAGNOSIS — I5032 Chronic diastolic (congestive) heart failure: Secondary | ICD-10-CM

## 2022-05-02 DIAGNOSIS — E1165 Type 2 diabetes mellitus with hyperglycemia: Secondary | ICD-10-CM

## 2022-05-02 DIAGNOSIS — J45909 Unspecified asthma, uncomplicated: Secondary | ICD-10-CM

## 2022-05-02 DIAGNOSIS — E669 Obesity, unspecified: Secondary | ICD-10-CM

## 2022-05-02 DIAGNOSIS — G47419 Narcolepsy without cataplexy: Secondary | ICD-10-CM

## 2022-05-02 DIAGNOSIS — K219 Gastro-esophageal reflux disease without esophagitis: Secondary | ICD-10-CM

## 2022-05-02 DIAGNOSIS — M069 Rheumatoid arthritis, unspecified: Secondary | ICD-10-CM

## 2022-05-02 DIAGNOSIS — R32 Unspecified urinary incontinence: Secondary | ICD-10-CM

## 2022-05-02 MED ORDER — FUROSEMIDE 40 MG PO TAB
60 mg | ORAL_TABLET | Freq: Every day | ORAL | 0 refills | 90.00000 days | Status: AC | PRN
Start: 2022-05-02 — End: ?

## 2022-05-02 NOTE — Assessment & Plan Note
Her diabetes is managed by her endocrinologist in the [facility] system.

## 2022-05-02 NOTE — Assessment & Plan Note
Although she has coronary calcification by CT imaging, her perfusion study last August was nonischemic and she does not have angina symptoms currently.  I do not think we need to pursue additional studies at this point for cardiac ischemia.

## 2022-05-02 NOTE — Progress Notes
Date of Service: 05/02/2022    Hailey Rodgers is a 71 y.o. female.       HPI     Hailey Rodgers was in the [place] clinic today for consultation regarding possible diastolic heart failure.  She was accompanied by her daughter who is a Engineer, civil (consulting).  After a brief question about [place] I learned far more about Hailey Rodgers than I ever thought I'd need to know.    Hailey Rodgers saw a cardiologist at [facility]) years ago for cardiac testing.  She has evidence of coronary disease with coronary calcification noted on CT imaging in the past.  She underwent a cardiac evaluation at [facility] after presenting with chest pain a few years ago, but she has never undergone coronary arteriography.    Last August she had a regadenoson thallium stress test at [facility] (non-ischemic) and in January she had an echocardiogram showing an ejection fraction of 65% with evidence of mild diastolic dysfunction.  She did not have any significant valve disease.  Her daughter mentioned that she doesn't trust those stress tests.    She has significant risk factors for vascular disease including diabetes, hypertension, and dyslipidemia.  She has been seeing Dr. Mady Haagensen in the rheumatology clinic at [facility] who raised concerns about peripheral edema and evidence of pulmonary vascular congestion on a recent x-ray.    The patient denies problems with exertional chest discomfort but she does have breathlessness.  Her daughter says that the leg edema is definitely worse toward the end of the day and the patient says that there is some bilateral erythema associated with increased swelling.    Hailey Rodgers denies any palpitations, syncope, or near syncope.  She has had no TIA or stroke symptoms.    She takes furosemide 40 mg/day which she has taken for a number of years.  Her daughter questions whether she should be on a different diuretic.         Vitals:    05/02/22 1025   BP: 118/68   BP Source: Arm, Left Upper   Pulse: 77   SpO2: 100%   O2 Device: None (Room air)   PainSc: Zero   Weight: 95.7 kg (211 lb)   Height: 170.2 cm (5' 7)     Body mass index is 33.05 kg/m?[name]     Past Medical History  Patient Active Problem List    Diagnosis Date Noted   ? Chronic heart failure with preserved ejection fraction (HCC) 05/02/2022   ? Rheumatoid arthritis involving multiple sites with positive rheumatoid factor (HCC) 04/24/2022   ? Pain in both lower extremities 08/04/2021   ? Joint pain 05/22/2021   ? Primary osteoarthritis involving multiple joints 01/05/2021   ? High risk medication use 01/04/2021   ? Rheumatoid arthritis flare (HCC) 11/09/2020   ? Syncope 10/25/2020   ? Seasonal allergic rhinitis 10/25/2020   ? Essential hypertension 10/25/2020   ? Coronary artery calcification 10/25/2020   ? Rheumatoid arthritis (HCC) 10/06/2020   ? 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 Age-related physical debility 10/06/2020   ? Gastroesophageal reflux disease without esophagitis 10/06/2020   ? Mixed hyperlipidemia 10/06/2020   ? COVID-19 virus infection 10/06/2020     06/2020     ? Diverticulosis 10/06/2020   ? Fatty liver disease, nonalcoholic    ? Frequent falls 06/27/2020   ? Class 1 obesity due to excess calories in adult 03/09/2020   ? Memory impairment 09/30/2019   ?  Seborrheic keratosis 11/09/2018   ? Intrinsic atopic dermatitis 11/09/2018   ? Poor sleep 01/26/2018   ? Family problems 01/26/2018   ? Panic disorder without agoraphobia 01/26/2018   ? Actinic keratosis 01/05/2018   ? Cervical radiculopathy at C8 12/30/2017   ? Cubital tunnel syndrome on left 12/30/2017   ? Persistent depressive disorder with anxious distress, currently moderate 10/21/2017   ? Pulmonary nodule, left 02/04/2017     10/06/2020 5. ?Right upper lobe groundglass nodule measuring 5 mm which may be   infectious or inflammatory. Multiple additional sub-5 mm nodules   bilaterally, most lymph nodes or granulomas. Follow-up CT in 12 months   recommended for further evaluation if clinically indicated.     Last Assessment & Plan:   Formatting of this note might be different from the original.  Incidentally noted on the CT chest 4 mm nodule.  Repeat CT scan in 12 months     ? Dysphagia 02/04/2017     Last Assessment & Plan:   Formatting of this note might be different from the original.  Patient was going outpatient evaluation for her dysphagia and had EGD done today,  EGD 02/04/17  Impressions:    Abnormal Motility- tertiary contractions noted. (Dilation (empiric) with no  subtle rings or strictures, Biopsy (to exclude EoE).    Normal mucosa in the whole stomach.    Normal mucosa in the whole examined duodenum.    Further evaluation per gastroenterology.     ? Dermatochalasis of eyelids of both eyes 06/11/2016   ? De Quervain's tenosynovitis, left 11/20/2015   ? Ptosis of both eyelids 11/06/2015   ? Pseudoarthrosis of cervical spine (HCC) 10/23/2015   ? Spinal stenosis of lumbar region with neurogenic claudication 10/23/2015   ? Spondylolisthesis of lumbar region 10/23/2015   ? 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 Generalized anxiety disorder 07/25/2015   ? Spondylolisthesis 07/10/2015   ? Myopia of both eyes with astigmatism and presbyopia 07/06/2015   ? Dermatochalasis of both upper eyelids 07/06/2015   ? 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 Cataract 05/11/2015   ? Dry eye 05/11/2015   ? MGD (meibomian gland dysfunction) 05/11/2015   ? Cervical stenosis of spine 04/10/2015   ? Stress incontinence 03/30/2015   ? Aortic insufficiency 03/27/2015   ? 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 Multilevel spinal stenosis 02/11/2015   ? Insufficient sleep syndrome 02/07/2015   ? Hypersomnia with sleep apnea 02/07/2015   ? Polyneuropathy associated with underlying disease (HCC) 09/12/2014   ? Insulin dependent type 2 diabetes mellitus (HCC) 05/04/2014   ? Obstructive sleep apnea 09/30/2013     Split Night 06/27/21 AHI 102.5 <88% 13.45min  Order for d/c services with dme placed per protocol -DME: [facility]  ?  Order for CPAP 10cm H2O placed per protocol   DME: [facility] did not complete wants bipap    ?     ? Restless legs syndrome (RLS) 09/30/2013   ? Hypothyroidism (acquired) 06/08/2012         Review of Systems   Constitutional: Negative.   HENT: Negative.    Eyes: Negative.    Cardiovascular: Positive for leg swelling.   Respiratory: Positive for shortness of breath.    Endocrine: Negative.    Hematologic/Lymphatic: Negative.    Skin: Negative.    Musculoskeletal: Negative.    Gastrointestinal: Negative.    Genitourinary: Negative.    Neurological: Negative.    Psychiatric/Behavioral: Negative.  Allergic/Immunologic: Negative.        Physical Exam    Physical Exam   General Appearance: no distress, morbidly obese   Skin: warm, no ulcers or xanthomas   Digits and Nails: no cyanosis or clubbing   Eyes: conjunctivae and lids normal, pupils are equal and round   Teeth/Gums/Palate: dentition unremarkable, no lesions   Lips & Oral Mucosa: no pallor or cyanosis   Neck Veins: normal JVP , neck veins are not distended   Thyroid: no nodules, masses, tenderness or enlargement   Chest Inspection: chest is normal in appearance   Respiratory Effort: breathing comfortably, no respiratory distress   Auscultation/Percussion: lungs clear to auscultation, no rales or rhonchi, no wheezing   PMI: PMI not enlarged or displaced   Cardiac Rhythm: regular rhythm and normal rate   Cardiac Auscultation: S1, S2 normal, no rub, no gallop   Murmurs: no murmur   Peripheral Circulation: normal peripheral circulation   Carotid Arteries: normal carotid upstroke bilaterally, no bruits   Radial Arteries: normal symmetric radial pulses   Abdominal Aorta: no abdominal aortic bruit   Pedal Pulses: normal symmetric pedal pulses   Lower Extremity Edema: 1+ bilateral lower extremity edema with mild erythema.    Abdominal Exam: soft, non-tender, no masses, bowel sounds normal   Liver & Spleen: no organomegaly   Gait & Station: walks without assistance   Muscle Strength: normal muscle tone   Orientation: oriented to time, place and person   Affect & Mood: appropriate and sustained affect   Language and Memory: patient responsive and seems to comprehend information   Neurologic Exam: neurological assessment grossly intact   Other: moves all extremities      Cardiovascular Studies    EKG:  SR, rate 72.  Poor R wave progression.    Cardiovascular Health Factors  Vitals BP Readings from Last 3 Encounters:   05/02/22 118/68   04/24/22 (!) 140/67   04/15/22 119/62     Wt Readings from Last 3 Encounters:   05/02/22 95.7 kg (211 lb)   04/24/22 97.1 kg (214 lb)   04/15/22 96.9 kg (213 lb 9.6 oz)     BMI Readings from Last 3 Encounters:   05/02/22 33.05 kg/m?   04/24/22 31.60 kg/m?   04/15/22 31.54 kg/m?      Smoking Social History     Tobacco Use   Smoking Status Never   Smokeless Tobacco Never   Vaping Use   ? Vaping Use: Never used      Lipid Profile Cholesterol   Date Value Ref Range Status   04/30/2022 122  Final     HDL   Date Value Ref Range Status   04/30/2022 43  Final     LDL   Date Value Ref Range Status   04/30/2022 61  Final     Triglycerides   Date Value Ref Range Status   04/30/2022 91  Final      Blood Sugar Hemoglobin A1C   Date Value Ref Range Status   08/04/2021 8.3 (H) 4.0 - 6.0 % Final     Comment:     The ADA recommends that most patients with type 1 and type 2 diabetes maintain   an A1c level <7%.       Glucose   Date Value Ref Range Status   03/04/2022 104 (H) 70 - 100 MG/DL Final   40/98/1191 478 (H) 70 - 100 MG/DL Final   29/56/2130 865 (H) 70 - 100 MG/DL  Final   11/24/2004 90 70 - 110 MG/DL Final     Glucose, POC   Date Value Ref Range Status   03/04/2022 120 (H) 70 - 100 MG/DL Final   16/09/9603 540 (H) 70 - 100 MG/DL Final   98/10/9146 829 (H) 70 - 100 MG/DL Final          Problems Addressed Today  Encounter Diagnoses   Name Primary?   ? Screening for heart disease Yes   ? Mixed hyperlipidemia    ? Chronic heart failure with preserved ejection fraction (HCC)    ? History of cardiac edema    ? Coronary artery calcification    ? Essential hypertension    ? Insulin dependent type 2 diabetes mellitus (HCC)    ? Syncope, unspecified syncope type        Assessment and Plan       Mixed hyperlipidemia  Lab Results   Component Value Date    CHOL 122 04/30/2022    TRIG 91 04/30/2022    HDL 43 04/30/2022    LDL 61 04/30/2022    VLDL 18 04/30/2022    NONHDLCHOL 89 08/04/2021    CHOLHDLC 3 04/30/2022      LDL treated to goal, but rosuvastatin being held due to problems with muscle weakness.  She had been on fenofibrate last year, but was having confusion and other issues leading to discontinuation.  She had elevated LFT's on atorvastatin in the past.    She needs to be on a statin for several reasons, so I do want there to be some closure on this issue.  Rheumatology initiated the question of holding statin and Dr. Herschell Dimes agreed, so they'll need to assess whether she's better subjectively after holding the Crestor.    Coronary artery calcification  Although she has coronary calcification by CT imaging, her perfusion study last August was nonischemic and she does not have angina symptoms currently.  I do not think we need to pursue additional studies at this point for cardiac ischemia.    Essential hypertension  The goal for her blood pressure is an average of 130/80 or less.  Blood pressure in clinic today looked fine.    Insulin dependent type 2 diabetes mellitus (HCC)  Her diabetes is managed by her endocrinologist in the [facility] system.    Syncope  She has hour-long episodes of decreased level of consciousness.  This doesn't sound like a cardiac rhythm issue and I didn't recommend any monitoring on the basis of the symptoms she and her daughter reported today.    Chronic heart failure with preserved ejection fraction (HCC)  I think she probably does have mild diastolic heart failure and suggested that we simply increase her furosemide dosage from 40 to 60 mg/day.  I ordered follow-up lab work for about 2 weeks from now including a basic metabolic profile and BNP.      Current Medications (including today's revisions)  ? acetaminophen (TYLENOL EXTRA STRENGTH) 500 mg tablet Take two tablets by mouth every 6 hours as needed. Max of 4,000 mg of acetaminophen in 24 hours.  Indications: pain   ? aspirin 81 mg chewable tablet Chew one tablet by mouth at bedtime daily.   ? blood-glucose sensor (DEXCOM G6 SENSOR MISC) Use  as directed.   ? calcium carbonate/vitamin D-3 (OSCAL-500+D) 1250 mg/200 unit tablet Take one tablet by mouth twice daily. Calcium Carb 1250mg  delivers 500mg  elemental Ca   ? DIPH/LIDO/ANTACID 1:1:1 (COMPOUND) Swish and Spit 5  mL by mouth as directed every 4 hours as needed.   ? dulaglutide (TRULICITY) 1.5 mg/0.5 mL injection pen Inject 0.5 mL under the skin every Wednesday.   ? ergocalciferol (VITAMIN D-2) 50,000 unit capsule Take one capsule by mouth every 7 days.   ? ferrous sulfate (FEOSOL) 325 mg (65 mg iron) tablet Take one tablet by mouth twice daily. Take on an empty stomach at least 1 hour before or 2 hours after food.   ? fexofenadine(+) (ALLEGRA) 180 mg tablet Take one tablet by mouth daily.   ? fluocinolone acetonide oil (DERMOTIC OIL) 0.01 % otic drops INSTILL 3 DROPS INTO NOSE DAILY AS DIRECTED   ? folic acid (FOLVITE) 1 mg tablet Take four tablets by mouth daily.   ? furosemide (LASIX) 40 mg tablet Take 1.5 tablets by mouth daily as needed.   ? insulin aspart (U-100) (NOVOLOG FLEXPEN U-100 INSULIN) 100 unit/mL (3 mL) PEN Inject 30 units under the skin prior to meals plus add on sliding scale 201-250 = 3 units, 251-300 = 6 units, greater than 300 = 9 units, 117 units daily max.   ? insulin aspart U-100 (NOVOLOG U-100 INSULIN ASPART) 100 unit/mL injection Inject twelve Units under the skin three times daily with meals. Take 12 units with breakfast, 22 units with lunch, and 30 units with dinner.  Indications: type 2 diabetes mellitus (Patient taking differently: Inject 25 Units under the skin three times daily before meals. Plus sliding scale: if BG 150-159 = 1 unit, 160-169 = 2 units  Indications: type 2 diabetes mellitus)   ? insulin degludec (TRESIBA FLEXTOUCH U-200) 200 unit/mL (3 mL) suncutaneous PEN Use as directed. Inject up to 100 Units per day.   ? insulin degludec (TRESIBA FLEXTOUCH) 200 unit/mL (3 mL) injection PEN Inject eighty Units under the skin at bedtime daily. (Patient taking differently: Inject 100 Units under the skin at bedtime daily.)   ? Insulin Needles (Disposable) (NANO PEN NEEDLE) 32 gauge x 5/32 ndle Use 1 Each as directed before meals and at bedtime.   ? lactobacillus rhamnosus GG (CULTURELLE) 15 billion cell capsule Take one capsule by mouth as directed daily.   ? levoFLOXacin (LEVAQUIN) 750 mg tablet Take 750 mg by mouth daily.   ? levothyroxine (SYNTHROID) 88 mcg tablet Take one tablet by mouth daily.   ? methotrexate sodium 2.5 mg tablet Take eight tablets by mouth every 7 days.   ? mirabegron (MYRBETRIQ) 50 mg ER tablet Take one tablet by mouth daily.   ? nortriptyline (PAMELOR) 25 mg capsule Take one capsule by mouth at bedtime daily.   ? NYSTOP 100,000 unit/gram topical powder Apply to clean, dry skin 2-3 times daily for yeast infection   ? oxyCODONE/acetaminophen (PERCOCET) 5/325 mg tablet Take one tablet to two tablets by mouth every 6 hours as needed   ? pantoprazole DR (PROTONIX) 40 mg tablet Take one tablet by mouth twice daily.   ? polyethylene glycol 3350 (MIRALAX) 17 g packet Take one packet by mouth twice daily as needed.   ? potassium chloride (KLOR-CON 10) 10 mEq tablet Take 2 tablets by mouth every day as needed if taking furosemide for edema.   ? pramipexole (MIRAPEX) 0.75 mg tablet Take one tablet by mouth twice daily. Take one tablet at 2pm and one at bedtime.   ? rosuvastatin (CRESTOR) 20 mg tablet Take one tablet by mouth daily. not currently taking, on hold for three months   ? senna/docusate (SENOKOT-S) 8.6/50 mg tablet Take one tablet  by mouth twice daily. (Patient taking differently: Take two tablets by mouth twice daily.)   ? venlafaxine XR (EFFEXOR XR) 150 mg capsule Take one capsule by mouth daily.   ? venlafaxine XR (EFFEXOR XR) 150 mg capsule TAKE 1 CAPSULE BY MOUTH ONCE DAILY WITH  BREAKFAST   ? vitamins, multi w/minerals 9 mg iron-400 mcg tab Take one tablet by mouth daily.     Total time spent on today's office visit was 60 minutes.  This includes face-to-face in person visit with patient as well as nonface-to-face time including review of the EMR, outside records, labs, radiologic studies, echocardiogram & other cardiovascular studies, formation of treatment plan, after visit summary, future disposition, and lastly on documentation.

## 2022-05-02 NOTE — Assessment & Plan Note
She has hour-long episodes of decreased level of consciousness.  This doesn't sound like a cardiac rhythm issue and I didn't recommend any monitoring on the basis of the symptoms she and her daughter reported today.

## 2022-05-02 NOTE — Assessment & Plan Note
I think she probably does have mild diastolic heart failure and suggested that we simply increase her furosemide dosage from 40 to 60 mg/day.  I ordered follow-up lab work for about 2 weeks from now including a basic metabolic profile and BNP.

## 2022-05-02 NOTE — Assessment & Plan Note
The goal for her blood pressure is an average of 130/80 or less.  Blood pressure in clinic today looked fine.

## 2022-05-08 ENCOUNTER — Encounter: Admit: 2022-05-08 | Discharge: 2022-05-08 | Payer: MEDICARE

## 2022-05-13 ENCOUNTER — Encounter: Admit: 2022-05-13 | Discharge: 2022-05-13 | Payer: MEDICARE

## 2022-05-13 DIAGNOSIS — G4733 Obstructive sleep apnea (adult) (pediatric): Secondary | ICD-10-CM

## 2022-05-14 ENCOUNTER — Ambulatory Visit: Admit: 2022-05-14 | Discharge: 2022-05-13 | Payer: MEDICARE

## 2022-05-15 ENCOUNTER — Encounter: Admit: 2022-05-15 | Discharge: 2022-05-15 | Payer: MEDICARE

## 2022-05-15 MED FILL — FOLIC ACID 1 MG PO TAB: 1 mg | ORAL | 30 days supply | Qty: 120 | Fill #9 | Status: AC

## 2022-05-16 ENCOUNTER — Encounter: Admit: 2022-05-16 | Discharge: 2022-05-16 | Payer: MEDICARE

## 2022-05-16 MED FILL — INSULIN DEGLUDEC 200 UNIT/ML (3 ML) SC INPN: 200 unit/mL (3 mL) | SUBCUTANEOUS | 90 days supply | Qty: 45 | Fill #2 | Status: AC

## 2022-05-16 MED FILL — INSULIN ASPART 100 UNIT/ML SC FLEXPEN: 100 unit/mL (3 mL) | SUBCUTANEOUS | 26 days supply | Qty: 30 | Fill #1 | Status: AC

## 2022-05-22 ENCOUNTER — Encounter: Admit: 2022-05-22 | Discharge: 2022-05-22 | Payer: MEDICARE

## 2022-05-22 ENCOUNTER — Emergency Department: Admit: 2022-05-22 | Discharge: 2022-05-22 | Payer: MEDICARE

## 2022-05-22 ENCOUNTER — Observation Stay: Admit: 2022-05-22 | Discharge: 2022-05-22 | Payer: MEDICARE

## 2022-05-22 DIAGNOSIS — G4733 Obstructive sleep apnea (adult) (pediatric): Secondary | ICD-10-CM

## 2022-05-22 DIAGNOSIS — R079 Chest pain, unspecified: Principal | ICD-10-CM

## 2022-05-22 DIAGNOSIS — M069 Rheumatoid arthritis, unspecified: Secondary | ICD-10-CM

## 2022-05-22 LAB — CBC AND DIFF
ABSOLUTE BASO COUNT: 0 K/UL (ref 0–0.20)
ABSOLUTE EOS COUNT: 0.2 K/UL (ref 0–0.45)
ABSOLUTE MONO COUNT: 0.9 K/UL — ABNORMAL HIGH (ref 0–0.80)
MDW (MONOCYTE DISTRIBUTION WIDTH): 19 (ref <20.7)
WBC COUNT: 8.1 K/UL (ref 4.5–11.0)

## 2022-05-22 LAB — COMPREHENSIVE METABOLIC PANEL
ALBUMIN: 3.9 g/dL (ref 3.5–5.0)
ALK PHOSPHATASE: 124 U/L — ABNORMAL HIGH (ref 25–110)
ALT: 21 U/L (ref 7–56)
ANION GAP: 9 K/UL (ref 3–12)
AST: 21 U/L (ref 7–40)
BLD UREA NITROGEN: 19 mg/dL (ref 7–25)
CALCIUM: 9.6 mg/dL (ref 8.5–10.6)
CO2: 24 MMOL/L (ref 21–30)
CREATININE: 0.6 mg/dL (ref 0.4–1.00)
EGFR: >60 mL/min (ref >60)
SODIUM: 134 MMOL/L — ABNORMAL LOW (ref 137–147)
TOTAL BILIRUBIN: 0.4 mg/dL (ref 0.3–1.2)

## 2022-05-22 LAB — BNP (B-TYPE NATRIURETIC PEPTI): BNP: 14 pg/mL — ABNORMAL HIGH (ref 0–100)

## 2022-05-22 LAB — PHOSPHORUS: PHOSPHORUS: 3.4 mg/dL (ref 2.0–4.5)

## 2022-05-22 LAB — MAGNESIUM: MAGNESIUM: 1.8 mg/dL (ref 1.6–2.6)

## 2022-05-22 LAB — HIGH SENSITIVITY TROPONIN I 0 HOUR: HIGH SENSITIVITY TROPONIN I 0 HOUR: 5 ng/L (ref <12)

## 2022-05-22 LAB — COVID-19 (SARS-COV-2) PCR

## 2022-05-22 LAB — HIGH SENSITIVITY TROPONIN I 2 HOUR: HIGH SENSITIVITY TROPONIN I 2 HOUR: 4 ng/L (ref <12)

## 2022-05-22 LAB — POC GLUCOSE: POC GLUCOSE: 219 mg/dL — ABNORMAL HIGH (ref 70–100)

## 2022-05-22 MED ORDER — ASPIRIN 81 MG PO CHEW
324 mg | Freq: Once | ORAL | 0 refills | Status: CP
Start: 2022-05-22 — End: ?
  Administered 2022-05-23: 02:00:00 324 mg via ORAL

## 2022-05-22 MED ORDER — ROSUVASTATIN 20 MG PO TAB
20 mg | Freq: Every day | ORAL | 0 refills
Start: 2022-05-22 — End: ?

## 2022-05-22 MED ORDER — ENOXAPARIN 40 MG/0.4 ML SC SYRG
40 mg | Freq: Every day | SUBCUTANEOUS | 0 refills
Start: 2022-05-22 — End: ?

## 2022-05-22 MED ORDER — ONDANSETRON HCL (PF) 4 MG/2 ML IJ SOLN
4 mg | INTRAVENOUS | 0 refills | PRN
Start: 2022-05-22 — End: ?

## 2022-05-22 MED ORDER — INSULIN ASPART 100 UNIT/ML SC FLEXPEN
0-6 [IU] | Freq: Before meals | SUBCUTANEOUS | 0 refills | Status: AC
Start: 2022-05-22 — End: ?

## 2022-05-22 MED ORDER — METHOCARBAMOL 750 MG PO TAB
750 mg | Freq: Once | ORAL | 0 refills | Status: CP
Start: 2022-05-22 — End: ?
  Administered 2022-05-23: 750 mg via ORAL

## 2022-05-22 MED ORDER — VENLAFAXINE 150 MG PO CP24
150 mg | Freq: Every day | ORAL | 0 refills
Start: 2022-05-22 — End: ?
  Administered 2022-05-23: 15:00:00 150 mg via ORAL

## 2022-05-22 MED ORDER — ONDANSETRON 4 MG PO TBDI
4 mg | ORAL | 0 refills | PRN
Start: 2022-05-22 — End: ?

## 2022-05-22 MED ORDER — INSULIN GLARGINE 100 UNIT/ML (3 ML) SC INJ PEN
100 [IU] | Freq: Every evening | SUBCUTANEOUS | 0 refills
Start: 2022-05-22 — End: ?

## 2022-05-22 MED ORDER — ASPIRIN 81 MG PO CHEW
81 mg | Freq: Every evening | ORAL | 0 refills
Start: 2022-05-22 — End: ?

## 2022-05-22 MED ORDER — DEXTROSE 50 % IN WATER (D50W) IV SYRG
12.5-25 g | INTRAVENOUS | 0 refills | Status: AC | PRN
Start: 2022-05-22 — End: ?

## 2022-05-22 MED ORDER — SENNOSIDES-DOCUSATE SODIUM 8.6-50 MG PO TAB
1 | Freq: Every day | ORAL | 0 refills | PRN
Start: 2022-05-22 — End: ?
  Administered 2022-05-23: 06:00:00 1 via ORAL

## 2022-05-22 MED ORDER — LEVOTHYROXINE 88 MCG PO TAB
88 ug | Freq: Every day | ORAL | 0 refills
Start: 2022-05-22 — End: ?
  Administered 2022-05-23: 15:00:00 88 ug via ORAL

## 2022-05-22 MED ORDER — MORPHINE 4 MG/ML IV SYRG
4 mg | Freq: Once | INTRAVENOUS | 0 refills | Status: CP
Start: 2022-05-22 — End: ?
  Administered 2022-05-23: 4 mg via INTRAVENOUS

## 2022-05-22 MED ORDER — PANTOPRAZOLE 40 MG PO TBEC
40 mg | Freq: Two times a day (BID) | ORAL | 0 refills
Start: 2022-05-22 — End: ?
  Administered 2022-05-23: 06:00:00 40 mg via ORAL

## 2022-05-22 MED ORDER — PRAMIPEXOLE 0.25 MG PO TAB
.75 mg | Freq: Two times a day (BID) | ORAL | 0 refills
Start: 2022-05-22 — End: ?
  Administered 2022-05-23 (×2): 0.75 mg via ORAL

## 2022-05-22 MED ORDER — ACETAMINOPHEN 325 MG PO TAB
650 mg | ORAL | 0 refills | PRN
Start: 2022-05-22 — End: ?

## 2022-05-22 MED ORDER — MELATONIN 5 MG PO TAB
5 mg | Freq: Every evening | ORAL | 0 refills | PRN
Start: 2022-05-22 — End: ?

## 2022-05-22 MED ORDER — NORTRIPTYLINE 25 MG PO CAP
25 mg | Freq: Every evening | ORAL | 0 refills
Start: 2022-05-22 — End: ?
  Administered 2022-05-23: 06:00:00 25 mg via ORAL

## 2022-05-22 MED ORDER — POLYETHYLENE GLYCOL 3350 17 GRAM PO PWPK
1 | Freq: Every day | ORAL | 0 refills | PRN
Start: 2022-05-22 — End: ?

## 2022-05-22 MED ORDER — FOLIC ACID 1 MG PO TAB
4 mg | Freq: Every day | ORAL | 0 refills
Start: 2022-05-22 — End: ?
  Administered 2022-05-23: 15:00:00 4 mg via ORAL

## 2022-05-22 MED ORDER — INSULIN ASPART U-100 100 UNIT/ML SC SOLN
25 [IU] | Freq: Three times a day (TID) | SUBCUTANEOUS | 0 refills
Start: 2022-05-22 — End: ?

## 2022-05-22 MED ORDER — NITROGLYCERIN 0.4 MG SL SUBL
.4 mg | SUBLINGUAL | 0 refills | Status: AC | PRN
Start: 2022-05-22 — End: ?
  Administered 2022-05-23: 02:00:00 0.4 mg via SUBLINGUAL

## 2022-05-22 NOTE — ED Notes
71 YO F presents to ED c/c generalized joint pain. Pt endorses history of rheumatoid arthritis with new onset "flare up" x2 days. Pt reports most severe pain is in bilateral wrists and toes on bilateral feet. Pt reports she is taking all medications as prescribed. Pt denies any falls r/t pain. Pt A&OX4, breathing unlabored. Bed locked in low position. Visitor at bedside.

## 2022-05-22 NOTE — Telephone Encounter
Order for BiPAP IPAP 18 cm H2O, EPAP 14 cm H2O; W/modem placed per recommendations.   DME: [facility]  Will continue to follow up.

## 2022-05-23 ENCOUNTER — Encounter: Admit: 2022-05-23 | Discharge: 2022-05-23 | Payer: MEDICARE

## 2022-05-23 ENCOUNTER — Observation Stay: Admit: 2022-05-23 | Discharge: 2022-05-23 | Payer: MEDICARE

## 2022-05-23 MED ADMIN — INSULIN ASPART 100 UNIT/ML SC FLEXPEN [87504]: 25 [IU] | SUBCUTANEOUS | @ 19:00:00 | Stop: 2022-05-24 | NDC 00169633910

## 2022-05-23 MED ADMIN — FUROSEMIDE 20 MG PO TAB [3294]: 60 mg | ORAL | @ 17:00:00 | Stop: 2022-05-24 | NDC 51079007201

## 2022-05-23 MED ADMIN — REGADENOSON 0.4 MG/5 ML IV SYRG [168865]: 0.4 mg | INTRAVENOUS | @ 14:00:00 | Stop: 2022-05-23 | NDC 00469650189

## 2022-05-23 MED ADMIN — CELECOXIB 100 MG PO CAP [82263]: 200 mg | ORAL | @ 15:00:00 | Stop: 2022-05-24 | NDC 00904650261

## 2022-05-23 MED ADMIN — RP DX N-13 AMMONIA MCI [210498]: 20.8 | INTRAVENOUS | @ 14:00:00 | Stop: 2022-05-23 | NDC 54029258109

## 2022-05-23 MED ADMIN — FERROUS SULFATE 325 MG (65 MG IRON) PO TAB [3074]: 325 mg | ORAL | @ 06:00:00 | Stop: 2022-05-24 | NDC 00904759161

## 2022-05-23 MED ADMIN — HYOSCYAMINE SULFATE 0.125 MG PO TBDI [29822]: 0.125 mg | SUBLINGUAL | @ 06:00:00 | Stop: 2022-05-24 | NDC 47781001201

## 2022-05-23 MED ADMIN — FAMOTIDINE 20 MG PO TAB [10011]: 40 mg | ORAL | @ 06:00:00 | Stop: 2022-05-24 | NDC 63739064510

## 2022-05-23 MED ADMIN — CELECOXIB 100 MG PO CAP [82263]: 200 mg | ORAL | @ 06:00:00 | Stop: 2022-05-24 | NDC 00904650261

## 2022-05-23 MED FILL — PREDNISONE 5 MG PO TAB: 5 mg | ORAL | 28 days supply | Qty: 70 | Fill #1 | Status: CP

## 2022-05-23 NOTE — ED Notes
Report given to Margaretann Loveless, RN

## 2022-05-24 ENCOUNTER — Encounter: Admit: 2022-05-24 | Discharge: 2022-05-24 | Payer: MEDICARE

## 2022-05-24 DIAGNOSIS — Z79899 Other long term (current) drug therapy: Secondary | ICD-10-CM

## 2022-05-24 NOTE — Progress Notes
===  View-only below this line===  ----- Message -----  From: Emmaline Life, MD  Sent: 05/24/2022   1:19 PM CDT  To: Yetta Barre, MD  Subject: FW: Patient Update                               Thanks for the Dimas Millin    ----- Message -----  From: Yetta Barre, MD  Sent: 05/23/2022   5:21 PM CDT  To: Emmaline Life, MD; Milagros Loll, RN  Subject: Patient Update                                   Hello,    Ms. Lapaglia was recently admitted for observation for chest pain rule out.  We are consulted for RA flare.  I saw most recent clinic visit from early May that plan is to change from subcutaneous to infusion Orencia.  She had significant synovitis in her hands, elbows, and feet.  We placed patient on a prednisone taper of 20 mg daily to be tapered by 5 mg every 5 days.  She will continue her oral methotrexate, folic acid.    Patient is to discharge on 05/23/2022.  Patient's daughter reported that Orencia infusions have been approved by insurance but does need to be scheduled.  She was planning to call to schedule these with the infusion center.  She is going to continue her subcutaneous Orencia depending on when the first infusion can be scheduled.    I wanted to give both of you a heads up in case there are questions from the infusion center.    Thanks,  Gerald Stabs

## 2022-05-29 NOTE — Progress Notes
Date of Service: 05/30/2022      Hospital discharge follow-up  Hailey Rodgers  is a 71 y.o.  female  followed by Dr. Barry Dienes  She presents for a post-hospitalization follow up today.    Her discharge summary was reviewed, and a thorough medication reconciliation was undertaken.    Reviewed hospital course with patient. Reviewed follow-up plan with patient. Reviewed current symptoms with patient.     Date of admission: 05/22/22  Date of discharge: 05/23/22  Date of Transition of Care Phone Call: 05/27/22  Today's Date:  05/30/22     HPI         Patient is 71 y.o. female with a past medical history of chronic diastolic heart failure, HFpEF 16%, non-obstructive CAD, diabetes mellitus type II, dyslipidemia, and OSA.     Hailey Rodgers was seen today in HF clinic for posthospital follow-up visit accompanied by her daughter.  She was initially admitted on 05/22/2022 as an observation status for evaluation of chest pain.  She underwent PET stress on 05/23/2022 which showed normal global function and perfusion without evidence of stress-induced myocardial ischemia.  Mild coronary artery calcifications were noted.  ACS was ruled out based on negative troponin x3 and lack of ischemic changes, chest x-ray showed no pulmonary opacities.  She also demonstrated no overt signs of heart failure, pleural effusion or pulmonary edema.  Patient was to work with case management upon discharge regarding arranging BiPAP after hospital visit as she had 6 severe OSA and will need to ensure she continues BiPAP therapy to prevent further decompensation of heart failure.  She was resumed on her PTA GDMT and discharged home on 05/23/2022.      Today Earnestine Leys  reports since she was discharged from the hospital she has been doing okay.  She has not experienced any recurrences of chest pain since she has been home.  She does tell me that she has not felt well for the last 6 to 8 weeks overall though.  She has increased fatigue and shortness of breath for the last 2 months.  Today, she denies current chest pain/pressure, no palpitations.  She does experience occasional lower extremity swelling, but she denies abdominal distention.  She struggles with rheumatoid arthritis, and this continues to be an ongoing concern for her.         Vitals:    05/30/22 1306   BP: (!) 144/68   BP Source: Arm, Left Upper   Pulse: 88   SpO2: 93%   O2 Device: Oxymask   O2 Liter Flow: 2 Lpm   PainSc: Zero   Weight: 94.3 kg (207 lb 12.8 oz)   Height: 170.2 cm (5' 7)     Body mass index is 32.55 kg/m?[name]    Physical Exam  Constitutional: No acute distress  HENT:  Head - normocephalic   Eyes: Conjunctivae normal  Neck: 6 JVP, no HJR   Cardiac Rhythm: Normal rate, regular rhythm  Cardiac Ausculation:  S1/S2 normal, no definitive S3 or S4, no gallop or friction rub  Murmurs: No murmur heard  Lower Extremity Edema: No lower extremity edema   Pulmonary/Chest: Breathing comfortably, no respiratory distress, lungs clear to ausculation, no rales / rhonchi / wheezing  Abdominal: Soft, non-tender, no obvious masses, bowel sounds present  Musculoskeletal: Moves all extremities, walks without assistance  Skin: Warm / dry, no erythema   Neurological: Alert and oriented to person, place, and time; no focal deficits  Psychiatric: Normal mood and affect;  judgment, behavior, and thought content normal  Language and Memory: patient responsive and seems to comprehend information   Vital signs reviewed      Cardiovascular Studies  05/23/22 PET STRESS  1.Unremarkable rest/ stress N13 ammonia myocardial perfusion PET/CT scan with normal global function and perfusion without evidence of stress induced myocardial ischemia.   2. Mild coronary artery calcifications.     01/09/22 ECHO  ? The left ventricular size is normal. Mild concentric hypertrophy. The left ventricular systolic function is normal. The visually estimated ejection fraction is 65%. There are no segmental wall motion abnormalities. Grade I (mild) left ventricular diastolic dysfunction. Normal left atrial pressure.  ? The right ventricular size, wall thickness and systolic function are normal. PASP is estimated at 30 mmHg.  ? Aortic Valve: The valve has focal thickening. No stenosis. Mild regurgitation.  ? The aortic root and ascending aorta are normal in size  ? No pericardial effusion.     Comparison to study 08/04/2021, there is no significant change.      Problems Addressed Today  Encounter Diagnoses   Name Primary?   ? Mixed hyperlipidemia Yes   ? Essential hypertension    ? Chronic heart failure with preserved ejection fraction (HCC)    ? Insulin dependent type 2 diabetes mellitus (HCC)    ? Other chest pain             Assessment/Plan:      Chronic diastolic heart failure, HFpEF 16%:    - NYHA Functional Class II, AHA Stage C symptoms.  - Estimated dry weight: 205 lbs.    - Patient appears euvolemic today    Current Dose Changes   SGLT-2 Inhibitor  Jardiance 25 mg daily    ARNI  NA    Aldosterone Antagonist  NA    Diuretic  Lasix 40 mg in AM and 20 mg in PM     Anticoagulation for Afib/flutter  NA    -Today she is describing NYHA functional class II, AHA stage C symptoms.  She appears euvolemic on exam and is around her estimated dry weight of 205 pounds.  Today in clinic she weighs 207 pounds.  She is taking all of her current medications as prescribed without any difficulties.  She has been experiencing increased fatigue and shortness of breath over the last 2 to 3 months.  I have asked her to have her echocardiogram repeated prior to her next office visit as these are not new symptoms for her since her previous echocardiogram in January of this year.  > Follow-up: Echocardiogram on 06/21/2022 followed by office visit with myself the same day.  We will consider further up titration of GDMT at that time.  We will also consider need for left heart catheterization pending review of echocardiogram results.     Non-obstructive CAD:   - last ischemic evaluation: 05/23/22 PET Stress, see report above.   - denies any anginal type symptoms  - continue asa and statin  > We will consider need for left heart catheterization on 06/21/2022 office visit after review of echocardiogram results    Renal surveillance:   - recent creatinine trends: 0.55-0.75;     - most recent creatinine on 05/22/22 was 0.63    OSA:    - reports compliance with CPAP.    Diabetes Mellitus Type II:    - HgA1C   Lab Results   Component Value Date/Time    HGBA1C 8.3 (H) 08/04/2021 04:46 AM    HGBA1C  8.4 (H) 11/11/2020 07:21 AM   > Jardiance 25 mg daily  > Managed per PCP    Dyslipidemia:   Lab Results   Component Value Date/Time    CHOL 122 04/30/2022 12:00 AM    TRIG 91 04/30/2022 12:00 AM    HDL 43 04/30/2022 12:00 AM    LDL 61 04/30/2022 12:00 AM    VLDL 18 04/30/2022 12:00 AM    NONHDLCHOL 89 08/04/2021 04:46 AM    CHOLHDLC 3 04/30/2022 12:00 AM   - continue rosuvastatin 20 mg po daily    Obesity:   - Body mass index is 32.55 kg/m?[name].  - Weight loss, Cardiac Healthy diet, and exercise encouraged    Follow up  Appointment scheduled to return to clinic on 06/21/22 with Eyvonne Left, NP. MS Merlinda Frederick and her daughter are in agreement and states understanding of this plan. Please see AVS for full patient education.            Education/care coordination and counseling time spent: 30 minutes of 45 minute visit.  Topics included HF disease process, medication instructions, daily weight monitoring, sodium and fluid restriction, understanding of HF symptoms, awareness of when and whom call, and when to schedule next office visit.     Thank you for the opportunity to participate in this pleasant patient's care. Please do not hesitate to contact me with any questions/concerns.    Allene Pyo, DNP, APRN, NP-C  Advanced Heart Failure APP   [facility]  Collaborating physician: Dr. Bing Matter         Current Medications (including today's revisions)  ? abatacept (ORENCIA CLICKJECT) 125 mg/mL injectable PEN Inject 1 mL under the skin every 7 days.   ? acetaminophen (TYLENOL EXTRA STRENGTH) 500 mg tablet Take two tablets by mouth every 6 hours as needed. Max of 4,000 mg of acetaminophen in 24 hours.  Indications: pain   ? aspirin 81 mg chewable tablet Chew one tablet by mouth at bedtime daily.   ? blood-glucose sensor (DEXCOM G6 SENSOR MISC) Use  as directed.   ? calcium carbonate (CALTRATE) 600 mg calcium (1,500 mg) tablet Take two tablets by mouth twice daily.   ? celecoxib (CELEBREX) 200 mg capsule Take one capsule by mouth twice daily.   ? CHOLEcalciferoL (vitamin D3) (OPTIMAL D3) 50,000 units capsule Take one capsule by mouth every 7 days.   ? cyclobenzaprine (FLEXERIL) 5 mg tablet Take one tablet by mouth three times daily as needed.   ? docusate (COLACE) 100 mg capsule Take two capsules by mouth twice daily.   ? dulaglutide (TRULICITY) 1.5 mg/0.5 mL injection pen Inject 0.5 mL under the skin every Wednesday.   ? famotidine (PEPCID) 40 mg tablet Take one tablet by mouth at bedtime daily.   ? ferrous sulfate (FEOSOL) 325 mg (65 mg iron) tablet Take one tablet by mouth twice daily. Take on an empty stomach at least 1 hour before or 2 hours after food.   ? fexofenadine(+) (ALLEGRA) 180 mg tablet Take one tablet by mouth daily.   ? fluocinolone acetonide oil (DERMOTIC OIL) 0.01 % otic drops INSTILL 3 DROPS INTO NOSE DAILY AS NEEDED   ? folic acid (FOLVITE) 1 mg tablet Take four tablets by mouth daily. (Patient taking differently: Take four tablets by mouth at bedtime daily.)   ? furosemide (LASIX) 40 mg tablet Take 1 tablet (40 mg) by mouth in the morning; Take 0.5 tablet (20mg ) by mouth midday.   ?  GEMTESA 75 mg tablet Take one tablet by mouth daily.   ? HYDROcodone/acetaminophen (NORCO) 5/325 mg tablet Take one tablet by mouth every 4 hours as needed for Pain.   ? insulin aspart (U-100) (NOVOLOG FLEXPEN U-100 INSULIN) 100 unit/mL (3 mL) PEN Inject 30 units under the skin prior to meals plus add on sliding scale 201-250 = 3 units, 251-300 = 6 units, greater than 300 = 9 units, 117 units daily max.   ? insulin degludec (TRESIBA FLEXTOUCH U-200) 200 unit/mL (3 mL) suncutaneous PEN Use as directed. Inject up to 100 Units per day. (Patient taking differently: Inject one hundred Units under the skin at bedtime daily.)   ? Insulin Needles (Disposable) (NANO PEN NEEDLE) 32 gauge x 5/32 ndle Use 1 Each as directed before meals and at bedtime.   ? JARDIANCE 25 mg tablet Take one tablet by mouth daily.   ? lactobacillus rhamnosus GG (CULTURELLE) 15 billion cell capsule Take one capsule by mouth as directed daily.   ? levothyroxine (SYNTHROID) 88 mcg tablet Take one tablet by mouth daily.   ? methotrexate sodium 2.5 mg tablet Take eight tablets by mouth every 7 days.   ? mirabegron (MYRBETRIQ) 50 mg ER tablet Take one tablet by mouth daily.   ? nitroglycerin (NITROSTAT) 0.4 mg tablet Place one tablet under tongue every 5 minutes as needed for Chest Pain. Max 3 tablets, call 911.   ? nortriptyline (PAMELOR) 25 mg capsule Take one capsule by mouth at bedtime daily.   ? nystatin (MYCOSTATIN) 100,000 unit/g topical cream Apply  topically to affected area twice daily as needed.   ? NYSTOP 100,000 unit/gram topical powder Apply to clean, dry skin 2-3 times daily for yeast infection   ? ondansetron (ZOFRAN ODT) 8 mg rapid dissolve tablet Dissolve one tablet by mouth every 8 hours as needed.   ? pantoprazole DR (PROTONIX) 40 mg tablet Take one tablet by mouth twice daily.   ? polyethylene glycol 3350 (MIRALAX) 17 g packet Take one packet by mouth twice daily as needed.   ? potassium chloride (KLOR-CON 10) 10 mEq tablet Take 2 tablets by mouth every day as needed if taking furosemide for edema.   ? pramipexole (MIRAPEX) 0.75 mg tablet Take one tablet by mouth twice daily. Take one tablet at 2pm and one at bedtime.   ? predniSONE (DELTASONE) 5 mg tablet Take four tablets by mouth daily for 7 days, THEN three tablets daily for 7 days, THEN two tablets daily for 7 days, THEN one tablet daily for 7 days.   ? rosuvastatin (CRESTOR) 20 mg tablet Take one tablet by mouth daily.   ? triamcinolone acetonide 0.5 % ointment Apply  topically to affected area three times daily as needed.   ? venlafaxine XR (EFFEXOR XR) 150 mg capsule Take one capsule by mouth daily.   ? vitamins, multi w/minerals 9 mg iron-400 mcg tab Take one tablet by mouth daily.

## 2022-05-30 ENCOUNTER — Encounter: Admit: 2022-05-30 | Discharge: 2022-05-30 | Payer: MEDICARE

## 2022-05-30 ENCOUNTER — Ambulatory Visit: Admit: 2022-05-30 | Discharge: 2022-05-31 | Payer: MEDICARE

## 2022-05-30 DIAGNOSIS — R3989 Other symptoms and signs involving the genitourinary system: Secondary | ICD-10-CM

## 2022-05-30 DIAGNOSIS — I5032 Chronic diastolic (congestive) heart failure: Secondary | ICD-10-CM

## 2022-05-30 DIAGNOSIS — K760 Fatty (change of) liver, not elsewhere classified: Secondary | ICD-10-CM

## 2022-05-30 DIAGNOSIS — H547 Unspecified visual loss: Secondary | ICD-10-CM

## 2022-05-30 DIAGNOSIS — F445 Conversion disorder with seizures or convulsions: Secondary | ICD-10-CM

## 2022-05-30 DIAGNOSIS — E119 Type 2 diabetes mellitus without complications: Secondary | ICD-10-CM

## 2022-05-30 DIAGNOSIS — R0789 Other chest pain: Secondary | ICD-10-CM

## 2022-05-30 DIAGNOSIS — R54 Age-related physical debility: Secondary | ICD-10-CM

## 2022-05-30 DIAGNOSIS — R911 Solitary pulmonary nodule: Secondary | ICD-10-CM

## 2022-05-30 DIAGNOSIS — G629 Polyneuropathy, unspecified: Secondary | ICD-10-CM

## 2022-05-30 DIAGNOSIS — J309 Allergic rhinitis, unspecified: Secondary | ICD-10-CM

## 2022-05-30 DIAGNOSIS — K5790 Diverticulosis of intestine, part unspecified, without perforation or abscess without bleeding: Secondary | ICD-10-CM

## 2022-05-30 DIAGNOSIS — R4189 Other symptoms and signs involving cognitive functions and awareness: Secondary | ICD-10-CM

## 2022-05-30 DIAGNOSIS — K219 Gastro-esophageal reflux disease without esophagitis: Secondary | ICD-10-CM

## 2022-05-30 DIAGNOSIS — E785 Hyperlipidemia, unspecified: Secondary | ICD-10-CM

## 2022-05-30 DIAGNOSIS — E782 Mixed hyperlipidemia: Secondary | ICD-10-CM

## 2022-05-30 DIAGNOSIS — F3341 Major depressive disorder, recurrent, in partial remission: Secondary | ICD-10-CM

## 2022-05-30 DIAGNOSIS — M069 Rheumatoid arthritis, unspecified: Secondary | ICD-10-CM

## 2022-05-30 DIAGNOSIS — E669 Obesity, unspecified: Secondary | ICD-10-CM

## 2022-05-30 DIAGNOSIS — M48061 Spinal stenosis, lumbar region without neurogenic claudication: Secondary | ICD-10-CM

## 2022-05-30 DIAGNOSIS — E039 Hypothyroidism, unspecified: Secondary | ICD-10-CM

## 2022-05-30 DIAGNOSIS — M4802 Spinal stenosis, cervical region: Secondary | ICD-10-CM

## 2022-05-30 DIAGNOSIS — G4733 Obstructive sleep apnea (adult) (pediatric): Secondary | ICD-10-CM

## 2022-05-30 DIAGNOSIS — Z79899 Other long term (current) drug therapy: Secondary | ICD-10-CM

## 2022-05-30 DIAGNOSIS — R29898 Other symptoms and signs involving the musculoskeletal system: Secondary | ICD-10-CM

## 2022-05-30 DIAGNOSIS — E7849 Other hyperlipidemia: Secondary | ICD-10-CM

## 2022-05-30 DIAGNOSIS — U071 COVID-19 virus infection: Secondary | ICD-10-CM

## 2022-05-30 DIAGNOSIS — G479 Sleep disorder, unspecified: Secondary | ICD-10-CM

## 2022-05-30 DIAGNOSIS — J40 Bronchitis, not specified as acute or chronic: Secondary | ICD-10-CM

## 2022-05-30 DIAGNOSIS — E034 Atrophy of thyroid (acquired): Secondary | ICD-10-CM

## 2022-05-30 DIAGNOSIS — G47419 Narcolepsy without cataplexy: Secondary | ICD-10-CM

## 2022-05-30 DIAGNOSIS — I10 Essential (primary) hypertension: Secondary | ICD-10-CM

## 2022-05-30 DIAGNOSIS — E1165 Type 2 diabetes mellitus with hyperglycemia: Secondary | ICD-10-CM

## 2022-05-30 DIAGNOSIS — R413 Other amnesia: Secondary | ICD-10-CM

## 2022-05-30 DIAGNOSIS — R32 Unspecified urinary incontinence: Secondary | ICD-10-CM

## 2022-05-30 DIAGNOSIS — J45909 Unspecified asthma, uncomplicated: Secondary | ICD-10-CM

## 2022-05-30 DIAGNOSIS — M1990 Unspecified osteoarthritis, unspecified site: Secondary | ICD-10-CM

## 2022-05-30 MED ORDER — NITROGLYCERIN 0.4 MG SL SUBL
.4 mg | ORAL_TABLET | SUBLINGUAL | 3 refills | 9.00000 days | Status: AC | PRN
Start: 2022-05-30 — End: ?
  Filled 2022-05-30: qty 25, 8d supply, fill #1

## 2022-05-30 MED ORDER — FUROSEMIDE 40 MG PO TAB
ORAL_TABLET | 3 refills | Status: CN
Start: 2022-05-30 — End: ?

## 2022-06-04 ENCOUNTER — Encounter: Admit: 2022-06-04 | Discharge: 2022-06-04 | Payer: MEDICARE

## 2022-06-04 MED ORDER — ORENCIA CLICKJECT 125 MG/ML SC ATIN
125 mg | SUBCUTANEOUS | 5 refills
Start: 2022-06-04 — End: ?

## 2022-06-11 ENCOUNTER — Encounter: Admit: 2022-06-11 | Discharge: 2022-06-11 | Payer: MEDICARE

## 2022-06-12 ENCOUNTER — Encounter: Admit: 2022-06-12 | Discharge: 2022-06-12 | Payer: MEDICARE

## 2022-06-12 MED ORDER — ORENCIA CLICKJECT 125 MG/ML SC ATIN
125 mg | SUBCUTANEOUS | 5 refills
Start: 2022-06-12 — End: ?

## 2022-06-13 ENCOUNTER — Encounter: Admit: 2022-06-13 | Discharge: 2022-06-13 | Payer: MEDICARE

## 2022-06-13 MED FILL — ABATACEPT 125 MG/ML SC ATIN: 125 mg/mL | SUBCUTANEOUS | 28 days supply | Qty: 4 | Fill #1 | Status: AC

## 2022-06-18 ENCOUNTER — Encounter: Admit: 2022-06-18 | Discharge: 2022-06-18 | Payer: MEDICARE

## 2022-06-19 ENCOUNTER — Encounter: Admit: 2022-06-19 | Discharge: 2022-06-19 | Payer: MEDICARE

## 2022-06-19 MED FILL — ABATACEPT 125 MG/ML SC ATIN: 125 mg/mL | SUBCUTANEOUS | 28 days supply | Qty: 4 | Fill #1 | Status: AC

## 2022-06-19 NOTE — Progress Notes
Date of Service: 06/21/2022       HPI         Patient is 71 y.o. female with a past medical history of chronic diastolic heart failure, HFpEF 16%, non-obstructive CAD, diabetes mellitus type II, dyslipidemia, and OSA.     Hailey Rodgers was seen in HF clinic for posthospital follow-up visit accompanied by her daughter on 05/30/22.  She was initially admitted on 05/22/2022 as an observation status for evaluation of chest pain.  She underwent PET stress on 05/23/2022 which showed normal global function and perfusion without evidence of stress-induced myocardial ischemia.  Mild coronary artery calcifications were noted.  ACS was ruled out based on negative troponin x3 and lack of ischemic changes, chest x-ray showed no pulmonary opacities.  She also demonstrated no overt signs of heart failure, pleural effusion or pulmonary edema.  Patient was to work with case management upon discharge regarding arranging BiPAP after hospital visit as she had 6 severe OSA and will need to ensure she continues BiPAP therapy to prevent further decompensation of heart failure.  She was resumed on her PTA GDMT and discharged home on 05/23/2022.      At her last OV, she reported that she had been doing okay since she had been discharged from the hospital. No medication changes were made, and she was asked to follow-up in the heart failure clinic in 1 month.    Today Hailey Rodgers  reports since she was last seen in clinic she has been doing okay. She continues to have fatigue and shortness of breath on exertion.  She denies current chest pain/pressure, no palpitations.  She does experience occasional lower extremity swelling, but she denies abdominal distention. She is currently being treated for an MRSA infection in her nose which has caused her to stop taking her RA medications for about 3 weeks. She has been trying to adhere to a low-sodium diet and drinking around 64 oz of fluid per day. Her weight is up slightly today (212# compared to 207# at her last OV); however, she does not appear to be volume overloaded on exam.           Vitals:    06/21/22 1348   BP: 119/59   BP Source: Arm, Left Upper   Pulse: 96   SpO2: 100%   O2 Device: CPAP/BiPAP   Weight: 96.3 kg (212 lb 6.4 oz)   Height: 170.2 cm (5' 7)     Body mass index is 33.27 kg/m?[name]    Physical Exam  Constitutional: No acute distress  HENT:  Head - normocephalic   Eyes: Conjunctivae normal  Neck: 6 JVP, no HJR   Cardiac Rhythm: Normal rate, regular rhythm  Cardiac Ausculation:  S1/S2 normal, no definitive S3 or S4, no gallop or friction rub  Murmurs: No murmur heard  Lower Extremity Edema: No lower extremity edema   Pulmonary/Chest: Breathing comfortably, no respiratory distress, lungs clear to ausculation, no rales / rhonchi / wheezing  Abdominal: Soft, non-tender, no obvious masses, bowel sounds present  Musculoskeletal: Moves all extremities, walks without assistance  Skin: Warm / dry, no erythema   Neurological: Alert and oriented to person, place, and time; no focal deficits  Psychiatric: Normal mood and affect; judgment, behavior, and thought content normal  Language and Memory: patient responsive and seems to comprehend information   Vital signs reviewed      Cardiovascular Studies  06/21/22 ECHO  Final report pending     05/23/22 PET STRESS  1.Unremarkable rest/ stress N13 ammonia myocardial perfusion PET/CT scan with normal global function and perfusion without evidence of stress induced myocardial ischemia.   2. Mild coronary artery calcifications.     01/09/22 ECHO  ? The left ventricular size is normal. Mild concentric hypertrophy. The left ventricular systolic function is normal. The visually estimated ejection fraction is 65%. There are no segmental wall motion abnormalities. Grade I (mild) left ventricular diastolic dysfunction. Normal left atrial pressure.  ? The right ventricular size, wall thickness and systolic function are normal. PASP is estimated at 30 mmHg.  ? Aortic Valve: The valve has focal thickening. No stenosis. Mild regurgitation.  ? The aortic root and ascending aorta are normal in size  ? No pericardial effusion.     Comparison to study 08/04/2021, there is no significant change.      Problems Addressed Today  Encounter Diagnoses   Name Primary?   ? Mixed hyperlipidemia Yes   ? Essential hypertension    ? Chronic heart failure with preserved ejection fraction (HCC)    ? Insulin dependent type 2 diabetes mellitus (HCC)             Assessment/Plan:      Chronic diastolic heart failure, HFpEF 45%:    - NYHA Functional Class II, AHA Stage C symptoms.  - Estimated dry weight: 205 lbs.    - Patient appears euvolemic today    Current Dose Changes   SGLT-2 Inhibitor  Jardiance 25 mg daily    ARNI  NA    Aldosterone Antagonist  NA 6/30 spironolactone 25 mg daily    Diuretic  Lasix 40 mg in AM and 20 mg in PM     Anticoagulation for Afib/flutter  NA    -Today she is describing NYHA functional class II, AHA stage C symptoms.  She appears euvolemic on exam. Today in clinic she weighs 212 pounds.  She is taking all of her current medications as prescribed without any difficulties.  She had an echocardigram completed today, final report pending.   - we will have her check her BMP today, then start spironolactone 25 mg daily.  - we will also have her recheck her BMP in 1 week after starting spironolactone.  > She will follow-up as scheduled with Dr. Barry Dienes on 08/01/2022     Non-obstructive CAD:   - last ischemic evaluation: 05/23/22 PET Stress, see report above.   - denies any anginal type symptoms  - continue asa and statin    Renal surveillance:   - recent creatinine trends: 0.55-0.75;     - most recent creatinine on 05/22/22 was 0.63  > we will check BMP today     OSA:    - reports compliance with CPAP.    Diabetes Mellitus Type II:    - HgA1C   Lab Results   Component Value Date/Time    HGBA1C 8.3 (H) 08/04/2021 04:46 AM    HGBA1C 8.4 (H) 11/11/2020 07:21 AM   > Jardiance 25 mg daily  > Managed per PCP    Dyslipidemia:   Lab Results   Component Value Date/Time    CHOL 122 04/30/2022 12:00 AM    TRIG 91 04/30/2022 12:00 AM    HDL 43 04/30/2022 12:00 AM    LDL 61 04/30/2022 12:00 AM    VLDL 18 04/30/2022 12:00 AM    NONHDLCHOL 89 08/04/2021 04:46 AM    CHOLHDLC 3 04/30/2022 12:00 AM   - continue rosuvastatin 20 mg po daily  Obesity:   - Body mass index is 33.27 kg/m?[name].  - Weight loss, Cardiac Healthy diet, and exercise encouraged    Follow up  Appointment scheduled to return to clinic on 08/01/22 with Dr. Edwinna Areola. Hailey Rodgers and her daughter are in agreement and states understanding of this plan. Please see AVS for full patient education.            Education/care coordination and counseling time spent: 30 minutes of 45 minute visit.  Topics included HF disease process, medication instructions, daily weight monitoring, sodium and fluid restriction, understanding of HF symptoms, awareness of when and whom call, and when to schedule next office visit.     Thank you for the opportunity to participate in this pleasant patient's care. Please do not hesitate to contact me with any questions/concerns.    Allene Pyo, DNP, APRN, NP-C  Advanced Heart Failure APP   [facility]  Collaborating physician: Dr. Bing Matter         Current Medications (including today's revisions)  ? abatacept (ORENCIA CLICKJECT) 125 mg/mL injectable PEN Inject 1 mL under the skin every 7 days for 30 days.   ? acetaminophen (TYLENOL EXTRA STRENGTH) 500 mg tablet Take two tablets by mouth every 6 hours as needed. Max of 4,000 mg of acetaminophen in 24 hours.  Indications: pain   ? aspirin 81 mg chewable tablet Chew one tablet by mouth at bedtime daily.   ? blood-glucose sensor (DEXCOM G6 SENSOR MISC) Use  as directed.   ? calcium carbonate (CALTRATE) 600 mg calcium (1,500 mg) tablet Take two tablets by mouth twice daily.   ? celecoxib (CELEBREX) 200 mg capsule Take one capsule by mouth twice daily.   ? CHOLEcalciferoL (vitamin D3) (OPTIMAL D3) 50,000 units capsule Take one capsule by mouth every 7 days.   ? cyclobenzaprine (FLEXERIL) 5 mg tablet Take one tablet by mouth three times daily as needed.   ? docusate (COLACE) 100 mg capsule Take two capsules by mouth twice daily.   ? dulaglutide (TRULICITY) 1.5 mg/0.5 mL injection pen Inject 0.5 mL under the skin every Wednesday.   ? famotidine (PEPCID) 40 mg tablet Take one tablet by mouth at bedtime daily.   ? ferrous sulfate (FEOSOL) 325 mg (65 mg iron) tablet Take one tablet by mouth twice daily. Take on an empty stomach at least 1 hour before or 2 hours after food.   ? fexofenadine(+) (ALLEGRA) 180 mg tablet Take one tablet by mouth daily.   ? fluocinolone acetonide oil (DERMOTIC OIL) 0.01 % otic drops INSTILL 3 DROPS INTO NOSE DAILY AS NEEDED   ? folic acid (FOLVITE) 1 mg tablet Take four tablets by mouth daily. (Patient taking differently: Take four tablets by mouth at bedtime daily.)   ? furosemide (LASIX) 40 mg tablet Take one tablet by mouth every morning. Take 1 tablet (40 mg) by mouth in the morning   ? GEMTESA 75 mg tablet Take one tablet by mouth daily.   ? HYDROcodone/acetaminophen (NORCO) 5/325 mg tablet Take one tablet by mouth every 4 hours as needed for Pain.   ? insulin aspart (U-100) (NOVOLOG FLEXPEN U-100 INSULIN) 100 unit/mL (3 mL) PEN Inject 30 units under the skin prior to meals plus add on sliding scale 201-250 = 3 units, 251-300 = 6 units, greater than 300 = 9 units, 117 units daily max.   ? insulin degludec (TRESIBA FLEXTOUCH U-200) 200 unit/mL (3 mL) suncutaneous PEN Use as directed. Inject  up to 100 Units per day. (Patient taking differently: Inject one hundred Units under the skin at bedtime daily.)   ? Insulin Needles (Disposable) (NANO PEN NEEDLE) 32 gauge x 5/32 ndle Use 1 Each as directed before meals and at bedtime.   ? JARDIANCE 25 mg tablet Take one tablet by mouth daily.   ? lactobacillus rhamnosus GG (CULTURELLE) 15 billion cell capsule Take one capsule by mouth as directed daily.   ? levothyroxine (SYNTHROID) 88 mcg tablet Take one tablet by mouth daily.   ? methotrexate sodium 2.5 mg tablet Take eight tablets by mouth every 7 days.   ? mirabegron (MYRBETRIQ) 50 mg ER tablet Take one tablet by mouth daily.   ? nitroglycerin (NITROSTAT) 0.4 mg tablet Place one tablet under tongue every 5 minutes as needed for Chest Pain. Max 3 tablets, call 911.   ? nortriptyline (PAMELOR) 25 mg capsule Take one capsule by mouth at bedtime daily.   ? nystatin (MYCOSTATIN) 100,000 unit/g topical cream Apply  topically to affected area twice daily as needed.   ? NYSTOP 100,000 unit/gram topical powder Apply to clean, dry skin 2-3 times daily for yeast infection   ? ondansetron (ZOFRAN ODT) 8 mg rapid dissolve tablet Dissolve one tablet by mouth every 8 hours as needed.   ? pantoprazole DR (PROTONIX) 40 mg tablet Take one tablet by mouth twice daily.   ? polyethylene glycol 3350 (MIRALAX) 17 g packet Take one packet by mouth twice daily as needed.   ? pramipexole (MIRAPEX) 0.75 mg tablet Take one tablet by mouth three times daily.   ? pramipexole (MIRAPEX) 0.75 mg tablet Take one tablet by mouth twice daily. Take one tablet at 2pm and one at bedtime.   ? predniSONE (DELTASONE) 2.5 mg tablet Take one tablet by mouth daily. 2 tabs po qd   ? rosuvastatin (CRESTOR) 20 mg tablet Take one tablet by mouth daily.   ? spironolactone (ALDACTONE) 25 mg tablet Take one tablet by mouth daily. Take with food.   ? triamcinolone acetonide 0.5 % ointment Apply  topically to affected area three times daily as needed.   ? venlafaxine XR (EFFEXOR XR) 150 mg capsule Take one capsule by mouth daily.   ? vibegron (GEMTESA) 75 mg tablet Take one tablet by mouth at bedtime daily.   ? vitamins, multi w/minerals 9 mg iron-400 mcg tab Take one tablet by mouth daily.

## 2022-06-20 ENCOUNTER — Encounter: Admit: 2022-06-20 | Discharge: 2022-06-20 | Payer: MEDICARE

## 2022-06-21 ENCOUNTER — Ambulatory Visit: Admit: 2022-06-21 | Discharge: 2022-06-21 | Payer: MEDICARE

## 2022-06-21 ENCOUNTER — Encounter: Admit: 2022-06-21 | Discharge: 2022-06-21 | Payer: MEDICARE

## 2022-06-21 DIAGNOSIS — G629 Polyneuropathy, unspecified: Secondary | ICD-10-CM

## 2022-06-21 DIAGNOSIS — E1165 Type 2 diabetes mellitus with hyperglycemia: Secondary | ICD-10-CM

## 2022-06-21 DIAGNOSIS — R3989 Other symptoms and signs involving the genitourinary system: Secondary | ICD-10-CM

## 2022-06-21 DIAGNOSIS — R54 Age-related physical debility: Secondary | ICD-10-CM

## 2022-06-21 DIAGNOSIS — J40 Bronchitis, not specified as acute or chronic: Secondary | ICD-10-CM

## 2022-06-21 DIAGNOSIS — M48061 Spinal stenosis, lumbar region without neurogenic claudication: Secondary | ICD-10-CM

## 2022-06-21 DIAGNOSIS — K760 Fatty (change of) liver, not elsewhere classified: Secondary | ICD-10-CM

## 2022-06-21 DIAGNOSIS — R4189 Other symptoms and signs involving cognitive functions and awareness: Secondary | ICD-10-CM

## 2022-06-21 DIAGNOSIS — I5032 Chronic diastolic (congestive) heart failure: Secondary | ICD-10-CM

## 2022-06-21 DIAGNOSIS — J309 Allergic rhinitis, unspecified: Secondary | ICD-10-CM

## 2022-06-21 DIAGNOSIS — I10 Essential (primary) hypertension: Secondary | ICD-10-CM

## 2022-06-21 DIAGNOSIS — G479 Sleep disorder, unspecified: Secondary | ICD-10-CM

## 2022-06-21 DIAGNOSIS — M1990 Unspecified osteoarthritis, unspecified site: Secondary | ICD-10-CM

## 2022-06-21 DIAGNOSIS — E119 Type 2 diabetes mellitus without complications: Secondary | ICD-10-CM

## 2022-06-21 DIAGNOSIS — F445 Conversion disorder with seizures or convulsions: Secondary | ICD-10-CM

## 2022-06-21 DIAGNOSIS — E669 Obesity, unspecified: Secondary | ICD-10-CM

## 2022-06-21 DIAGNOSIS — E7849 Other hyperlipidemia: Secondary | ICD-10-CM

## 2022-06-21 DIAGNOSIS — G47419 Narcolepsy without cataplexy: Secondary | ICD-10-CM

## 2022-06-21 DIAGNOSIS — G4733 Obstructive sleep apnea (adult) (pediatric): Secondary | ICD-10-CM

## 2022-06-21 DIAGNOSIS — K5790 Diverticulosis of intestine, part unspecified, without perforation or abscess without bleeding: Secondary | ICD-10-CM

## 2022-06-21 DIAGNOSIS — F3341 Major depressive disorder, recurrent, in partial remission: Secondary | ICD-10-CM

## 2022-06-21 DIAGNOSIS — K219 Gastro-esophageal reflux disease without esophagitis: Secondary | ICD-10-CM

## 2022-06-21 DIAGNOSIS — E034 Atrophy of thyroid (acquired): Secondary | ICD-10-CM

## 2022-06-21 DIAGNOSIS — R911 Solitary pulmonary nodule: Secondary | ICD-10-CM

## 2022-06-21 DIAGNOSIS — M069 Rheumatoid arthritis, unspecified: Secondary | ICD-10-CM

## 2022-06-21 DIAGNOSIS — E785 Hyperlipidemia, unspecified: Secondary | ICD-10-CM

## 2022-06-21 DIAGNOSIS — Z79899 Other long term (current) drug therapy: Secondary | ICD-10-CM

## 2022-06-21 DIAGNOSIS — U071 COVID-19 virus infection: Secondary | ICD-10-CM

## 2022-06-21 DIAGNOSIS — R413 Other amnesia: Secondary | ICD-10-CM

## 2022-06-21 DIAGNOSIS — J45909 Unspecified asthma, uncomplicated: Secondary | ICD-10-CM

## 2022-06-21 DIAGNOSIS — M4802 Spinal stenosis, cervical region: Secondary | ICD-10-CM

## 2022-06-21 DIAGNOSIS — R32 Unspecified urinary incontinence: Secondary | ICD-10-CM

## 2022-06-21 DIAGNOSIS — E039 Hypothyroidism, unspecified: Secondary | ICD-10-CM

## 2022-06-21 DIAGNOSIS — R29898 Other symptoms and signs involving the musculoskeletal system: Secondary | ICD-10-CM

## 2022-06-21 DIAGNOSIS — E782 Mixed hyperlipidemia: Secondary | ICD-10-CM

## 2022-06-21 DIAGNOSIS — H547 Unspecified visual loss: Secondary | ICD-10-CM

## 2022-06-21 LAB — CBC AND DIFF
ABSOLUTE BASO COUNT: 0 K/UL (ref 0–0.20)
ABSOLUTE EOS COUNT: 0.2 K/UL (ref 0–0.45)
ABSOLUTE LYMPH COUNT: 2 K/UL (ref 1.0–4.8)
ABSOLUTE MONO COUNT: 0.6 K/UL (ref 0–0.80)
ABSOLUTE NEUTROPHIL: 3.5 K/UL (ref 1.8–7.0)
BASOPHILS %: 1 % (ref 0–2)
EOSINOPHILS %: 3 % (ref 0–5)
HEMOGLOBIN: 12 g/dL (ref 12.0–15.0)
LYMPHOCYTES %: 32 % (ref 24–44)
MCHC: 32 g/dL (ref 32.0–36.0)
MCV: 87 FL (ref >60)
MONOCYTES %: 10 % (ref 4–12)
MPV: 7.9 FL (ref 7–11)
NEUTROPHILS %: 54 % (ref 41–77)
PLATELET COUNT: 238 K/UL (ref 150–400)
RDW: 14 % (ref 11–15)
WBC COUNT: 6.5 K/UL — ABNORMAL HIGH (ref 4.5–11.0)

## 2022-06-21 LAB — ALT (SGPT): ALT: 22 U/L (ref 7–56)

## 2022-06-21 LAB — BASIC METABOLIC PANEL
ANION GAP: 8 (ref 3–12)
BLD UREA NITROGEN: 30 mg/dL — ABNORMAL HIGH (ref 7–25)
CALCIUM: 9.3 mg/dL (ref 8.5–10.6)
CHLORIDE: 100 MMOL/L (ref 98–110)
CO2: 28 MMOL/L (ref 21–30)
POTASSIUM: 4.2 MMOL/L (ref 3.5–5.1)
SODIUM: 136 MMOL/L — ABNORMAL LOW (ref 137–147)

## 2022-06-21 MED ORDER — PRAMIPEXOLE 0.75 MG PO TAB
.75 mg | ORAL_TABLET | Freq: Three times a day (TID) | ORAL | 3 refills | Status: CN
Start: 2022-06-21 — End: ?

## 2022-06-21 MED ORDER — SPIRONOLACTONE 25 MG PO TAB
25 mg | ORAL_TABLET | Freq: Every day | ORAL | 1 refills | 90.00000 days | Status: AC
Start: 2022-06-21 — End: ?
  Filled 2022-06-21: qty 90, 90d supply, fill #1

## 2022-06-21 MED ORDER — FUROSEMIDE 40 MG PO TAB
40 mg | ORAL_TABLET | Freq: Every morning | ORAL | 3 refills | Status: CN
Start: 2022-06-21 — End: ?

## 2022-06-21 MED ORDER — VIBEGRON 75 MG PO TAB
75 mg | ORAL_TABLET | Freq: Every evening | ORAL | 3 refills | Status: CN
Start: 2022-06-21 — End: ?

## 2022-06-21 MED ORDER — PERFLUTREN LIPID MICROSPHERES 1.1 MG/ML IV SUSP
1-10 mL | Freq: Once | INTRAVENOUS | 0 refills | Status: CP | PRN
Start: 2022-06-21 — End: ?
  Administered 2022-06-21: 17:00:00 4 mL via INTRAVENOUS

## 2022-06-21 NOTE — Progress Notes
06/21/22  -WBC 6.5K with normal absolute differential counts, hemoglobin 12.5 g per DL, platelets 238 K  -Serum creatinine 0.74 mg per DL, AL T 22 U/L

## 2022-06-23 ENCOUNTER — Encounter: Admit: 2022-06-23 | Discharge: 2022-06-23 | Payer: MEDICARE

## 2022-06-24 ENCOUNTER — Encounter: Admit: 2022-06-24 | Discharge: 2022-06-24 | Payer: MEDICARE

## 2022-06-24 MED FILL — VIBEGRON 75 MG PO TAB: 75 mg | ORAL | 30 days supply | Qty: 30 | Fill #1 | Status: AC

## 2022-06-24 MED FILL — PRAMIPEXOLE 0.75 MG PO TAB: 0.75 mg | ORAL | 90 days supply | Qty: 270 | Fill #1 | Status: AC

## 2022-06-26 ENCOUNTER — Encounter: Admit: 2022-06-26 | Discharge: 2022-06-26 | Payer: MEDICARE

## 2022-07-10 ENCOUNTER — Encounter: Admit: 2022-07-10 | Discharge: 2022-07-10 | Payer: MEDICARE

## 2022-07-11 ENCOUNTER — Encounter: Admit: 2022-07-11 | Discharge: 2022-07-11 | Payer: MEDICARE

## 2022-07-12 ENCOUNTER — Encounter: Admit: 2022-07-12 | Discharge: 2022-07-12 | Payer: MEDICARE

## 2022-07-12 NOTE — Progress Notes
Three attempts were made to contact Hailey Rodgers to refill their medication(s) ORENCIA CLICKJECT 035 MG/ML SC ATIN. At this time, the patient will be removed from the pharmacy refill management program. The patient may be re-enrolled at any time by contacting the pharmacy. Ambulatory pharmacist notified.    [place]  [phone]

## 2022-07-15 ENCOUNTER — Encounter: Admit: 2022-07-15 | Discharge: 2022-07-15 | Payer: MEDICARE

## 2022-07-17 ENCOUNTER — Encounter: Admit: 2022-07-17 | Discharge: 2022-07-17 | Payer: MEDICARE

## 2022-07-17 MED ORDER — CHOLECALCIFEROL (VITAMIN D3) 1,250 MCG (50,000 UNIT) PO CAP
50000 [IU] | ORAL_CAPSULE | ORAL | 5 refills | Status: CN
Start: 2022-07-17 — End: ?

## 2022-07-17 MED ORDER — PANTOPRAZOLE 40 MG PO TBEC
40 mg | ORAL_TABLET | Freq: Two times a day (BID) | ORAL | 3 refills | 90.00000 days | Status: AC
Start: 2022-07-17 — End: ?
  Filled 2022-07-18: qty 180, 90d supply, fill #1

## 2022-07-18 ENCOUNTER — Encounter: Admit: 2022-07-18 | Discharge: 2022-07-18 | Payer: MEDICARE

## 2022-07-18 MED FILL — CHOLECALCIFEROL (VITAMIN D3) 1,250 MCG (50,000 UNIT) PO CAP: 50000 units | ORAL | 84 days supply | Qty: 12 | Fill #1 | Status: CP

## 2022-07-18 MED FILL — FOLIC ACID 1 MG PO TAB: 1 mg | ORAL | 30 days supply | Qty: 120 | Fill #10 | Status: CP

## 2022-07-22 ENCOUNTER — Encounter: Admit: 2022-07-22 | Discharge: 2022-07-22 | Payer: MEDICARE

## 2022-07-22 ENCOUNTER — Ambulatory Visit: Admit: 2022-07-22 | Discharge: 2022-07-23 | Payer: MEDICARE

## 2022-07-22 VITALS — BP 118/57 | HR 97 | Temp 97.90000°F | Resp 18 | Wt 215.6 lb

## 2022-07-22 VITALS — BP 117/66 | HR 88

## 2022-07-22 DIAGNOSIS — Z79899 Other long term (current) drug therapy: Secondary | ICD-10-CM

## 2022-07-22 DIAGNOSIS — Z8679 Personal history of other diseases of the circulatory system: Secondary | ICD-10-CM

## 2022-07-22 DIAGNOSIS — M069 Rheumatoid arthritis, unspecified: Secondary | ICD-10-CM

## 2022-07-22 LAB — CBC AND DIFF
ABSOLUTE BASO COUNT: 0 K/UL (ref 0–0.20)
ABSOLUTE EOS COUNT: 0.2 K/UL (ref 0–0.45)
ABSOLUTE LYMPH COUNT: 1.6 K/UL (ref 1.0–4.8)
ABSOLUTE MONO COUNT: 0.6 K/UL (ref 0–0.80)
ABSOLUTE NEUTROPHIL: 3.9 K/UL (ref 1.8–7.0)
EOSINOPHILS %: 3 % (ref 0–5)
HEMATOCRIT: 39 % (ref 36–45)
HEMOGLOBIN: 13 g/dL (ref 12.0–15.0)
LYMPHOCYTES %: 25 % (ref 24–44)
MCH: 29 pg (ref 26–34)
MCHC: 33 g/dL (ref 32.0–36.0)
MCV: 86 FL (ref 80–100)
MONOCYTES %: 10 % (ref 4–12)
MPV: 8.5 FL (ref 7–11)
NEUTROPHILS %: 61 % (ref 41–77)
PLATELET COUNT: 270 K/UL (ref 150–400)
RBC COUNT: 4.5 M/UL (ref 4.0–5.0)
RDW: 13 % (ref 11–15)
WBC COUNT: 6.5 K/UL (ref 4.5–11.0)

## 2022-07-22 LAB — BNP (B-TYPE NATRIURETIC PEPTI): BNP: 8 pg/mL (ref 0–100)

## 2022-07-22 LAB — CREATININE
CREATININE: 0.8 mg/dL (ref 0.4–1.00)
EGFR: >60 mL/min (ref >60)

## 2022-07-22 LAB — ALT (SGPT): ALT: 27 U/L (ref 7–56)

## 2022-07-22 MED ORDER — ABATACEPT IVPB
750 mg | Freq: Once | INTRAVENOUS | 0 refills
Start: 2022-07-22 — End: ?

## 2022-07-22 MED ORDER — ABATACEPT IVPB
750 mg | Freq: Once | INTRAVENOUS | 0 refills | Status: CP
Start: 2022-07-22 — End: ?
  Administered 2022-07-22 (×2): 750 mg via INTRAVENOUS

## 2022-07-22 NOTE — Progress Notes
Pt tolerated 1st Orencia infusion without problems. Pt left infusion center in stable condition, with steady gait with cane, accompanied by daughter.

## 2022-07-23 ENCOUNTER — Encounter: Admit: 2022-07-23 | Discharge: 2022-07-23 | Payer: MEDICARE

## 2022-07-23 DIAGNOSIS — M0579 Rheumatoid arthritis with rheumatoid factor of multiple sites without organ or systems involvement: Principal | ICD-10-CM

## 2022-07-23 DIAGNOSIS — I5032 Chronic diastolic (congestive) heart failure: Secondary | ICD-10-CM

## 2022-07-25 ENCOUNTER — Encounter: Admit: 2022-07-25 | Discharge: 2022-07-25 | Payer: MEDICARE

## 2022-07-25 NOTE — Telephone Encounter
Patient returned our call.  She describes her vision concern as peripheral dim vision and concerns with cataracts.  No floaters or flashes. Pt state that this dimness has been occurring for a few weeks and also thinks she needs new glasses.  Scheduled patient in to see Dr. Haywood Lasso for a comprehensive eye exam and possible cataract eval.  Scheduled for Thursday August 10@ 2;00pm.  No further action required today.

## 2022-07-25 NOTE — Telephone Encounter
Patient contacted our clinic today stating that she is experiencing black spots in her vision recently.  She is currently scheduled for her annual with Dr Arlana Hove 10/2022.  Left message as phone went straight to VM x 6 times.  Left message recommending that patient call us back to discuss and that we can schedule her in with retina for a consult.  PLEASE SCHEDULE WITH RETINA WHEN PATIENT CALLS BACK.  IF SHE HAS ANY FURTHER CONCERNS, REDIRECT TO TRIAGE.

## 2022-07-28 ENCOUNTER — Encounter: Admit: 2022-07-28 | Discharge: 2022-07-28 | Payer: MEDICARE

## 2022-07-28 MED ORDER — METHOTREXATE SODIUM 2.5 MG PO TAB
20 mg | ORAL_TABLET | ORAL | 0 refills
Start: 2022-07-28 — End: ?

## 2022-07-29 ENCOUNTER — Encounter: Admit: 2022-07-29 | Discharge: 2022-07-29 | Payer: MEDICARE

## 2022-07-29 MED FILL — VENLAFAXINE 150 MG PO CP24: 150 mg | ORAL | 90 days supply | Qty: 90 | Fill #2 | Status: AC

## 2022-07-29 MED FILL — VIBEGRON 75 MG PO TAB: 75 mg | ORAL | 30 days supply | Qty: 30 | Fill #2 | Status: AC

## 2022-07-29 MED FILL — INSULIN ASPART 100 UNIT/ML SC FLEXPEN: 100 unit/mL (3 mL) | SUBCUTANEOUS | 26 days supply | Qty: 30 | Fill #2 | Status: AC

## 2022-07-30 ENCOUNTER — Encounter: Admit: 2022-07-30 | Discharge: 2022-07-30 | Payer: MEDICARE

## 2022-07-31 MED FILL — METHOTREXATE SODIUM 2.5 MG PO TAB: 2.5 mg | ORAL | 84 days supply | Qty: 96 | Fill #1 | Status: AC

## 2022-08-01 ENCOUNTER — Encounter: Admit: 2022-08-01 | Discharge: 2022-08-01 | Payer: MEDICARE

## 2022-08-01 ENCOUNTER — Ambulatory Visit: Admit: 2022-08-01 | Discharge: 2022-08-02 | Payer: MEDICARE

## 2022-08-01 DIAGNOSIS — G4733 Obstructive sleep apnea (adult) (pediatric): Secondary | ICD-10-CM

## 2022-08-01 DIAGNOSIS — E669 Obesity, unspecified: Secondary | ICD-10-CM

## 2022-08-01 DIAGNOSIS — R4189 Other symptoms and signs involving cognitive functions and awareness: Secondary | ICD-10-CM

## 2022-08-01 DIAGNOSIS — E785 Hyperlipidemia, unspecified: Secondary | ICD-10-CM

## 2022-08-01 DIAGNOSIS — R413 Other amnesia: Secondary | ICD-10-CM

## 2022-08-01 DIAGNOSIS — J40 Bronchitis, not specified as acute or chronic: Secondary | ICD-10-CM

## 2022-08-01 DIAGNOSIS — R32 Unspecified urinary incontinence: Secondary | ICD-10-CM

## 2022-08-01 DIAGNOSIS — E119 Type 2 diabetes mellitus without complications: Secondary | ICD-10-CM

## 2022-08-01 DIAGNOSIS — J45909 Unspecified asthma, uncomplicated: Secondary | ICD-10-CM

## 2022-08-01 DIAGNOSIS — G629 Polyneuropathy, unspecified: Secondary | ICD-10-CM

## 2022-08-01 DIAGNOSIS — H2513 Age-related nuclear cataract, bilateral: Secondary | ICD-10-CM

## 2022-08-01 DIAGNOSIS — E7849 Other hyperlipidemia: Secondary | ICD-10-CM

## 2022-08-01 DIAGNOSIS — M4802 Spinal stenosis, cervical region: Secondary | ICD-10-CM

## 2022-08-01 DIAGNOSIS — K219 Gastro-esophageal reflux disease without esophagitis: Secondary | ICD-10-CM

## 2022-08-01 DIAGNOSIS — F3341 Major depressive disorder, recurrent, in partial remission: Secondary | ICD-10-CM

## 2022-08-01 DIAGNOSIS — R29898 Other symptoms and signs involving the musculoskeletal system: Secondary | ICD-10-CM

## 2022-08-01 DIAGNOSIS — G47419 Narcolepsy without cataplexy: Secondary | ICD-10-CM

## 2022-08-01 DIAGNOSIS — Z8679 Personal history of other diseases of the circulatory system: Secondary | ICD-10-CM

## 2022-08-01 DIAGNOSIS — M48061 Spinal stenosis, lumbar region without neurogenic claudication: Secondary | ICD-10-CM

## 2022-08-01 DIAGNOSIS — E1165 Type 2 diabetes mellitus with hyperglycemia: Secondary | ICD-10-CM

## 2022-08-01 DIAGNOSIS — E034 Atrophy of thyroid (acquired): Secondary | ICD-10-CM

## 2022-08-01 DIAGNOSIS — J309 Allergic rhinitis, unspecified: Secondary | ICD-10-CM

## 2022-08-01 DIAGNOSIS — F445 Conversion disorder with seizures or convulsions: Secondary | ICD-10-CM

## 2022-08-01 DIAGNOSIS — R54 Age-related physical debility: Secondary | ICD-10-CM

## 2022-08-01 DIAGNOSIS — E039 Hypothyroidism, unspecified: Secondary | ICD-10-CM

## 2022-08-01 DIAGNOSIS — H04129 Dry eye syndrome of unspecified lacrimal gland: Secondary | ICD-10-CM

## 2022-08-01 DIAGNOSIS — K760 Fatty (change of) liver, not elsewhere classified: Secondary | ICD-10-CM

## 2022-08-01 DIAGNOSIS — R911 Solitary pulmonary nodule: Secondary | ICD-10-CM

## 2022-08-01 DIAGNOSIS — M069 Rheumatoid arthritis, unspecified: Secondary | ICD-10-CM

## 2022-08-01 DIAGNOSIS — Z79899 Other long term (current) drug therapy: Secondary | ICD-10-CM

## 2022-08-01 DIAGNOSIS — E782 Mixed hyperlipidemia: Secondary | ICD-10-CM

## 2022-08-01 DIAGNOSIS — M1990 Unspecified osteoarthritis, unspecified site: Secondary | ICD-10-CM

## 2022-08-01 DIAGNOSIS — I2510 Atherosclerotic heart disease of native coronary artery without angina pectoris: Secondary | ICD-10-CM

## 2022-08-01 DIAGNOSIS — H547 Unspecified visual loss: Secondary | ICD-10-CM

## 2022-08-01 DIAGNOSIS — I10 Essential (primary) hypertension: Secondary | ICD-10-CM

## 2022-08-01 DIAGNOSIS — G479 Sleep disorder, unspecified: Secondary | ICD-10-CM

## 2022-08-01 DIAGNOSIS — U071 COVID-19 virus infection: Secondary | ICD-10-CM

## 2022-08-01 DIAGNOSIS — I5032 Chronic diastolic (congestive) heart failure: Secondary | ICD-10-CM

## 2022-08-01 DIAGNOSIS — R3989 Other symptoms and signs involving the genitourinary system: Secondary | ICD-10-CM

## 2022-08-01 DIAGNOSIS — K5790 Diverticulosis of intestine, part unspecified, without perforation or abscess without bleeding: Secondary | ICD-10-CM

## 2022-08-01 DIAGNOSIS — Z136 Encounter for screening for cardiovascular disorders: Secondary | ICD-10-CM

## 2022-08-01 NOTE — Assessment & Plan Note
Spironolactone added in June; lab to be drawn Monday.  She has an appointment in the Lymphedema Clinic at [facility] next week.

## 2022-08-01 NOTE — Progress Notes
Date of Service: 08/01/2022    Hailey Rodgers is a 71 y.o. female.       HPI     Hailey Rodgers slept through an office visit today while her daughter, a Engineer, civil (consulting), did all the talking.  Hailey Rodgers started BiPAP in late June and claims to be using it but only gets about 5 hours of sleep per night, she says.  Although Hailey Rodgers was awake for a bit at the beginning of the office visit she basically snoozed through most of it!    Her daughter reports that Hailey Rodgers is planning to see a lymphedema clinic at [facility] next week for her peripheral edema.    The rosuvastatin was held by Dr. Mady Haagensen about 3 months ago and this does not seem to have resulted in any improvement in the patient's fatigue symptoms.  Her daughter has the idea that we need to get a lipid profile before making the decision to restart the rosuvastatin so the patient should be getting her blood drawn when she goes down to [place] on Monday.    Hailey Rodgers is not very active, she still has some degree of exertional breathlessness but seems to have done reasonably well on the combination of spironolactone and furosemide.  Will get a basic metabolic profile on Monday as well.    The patient is not having any chest discomfort nor is she having palpitations or syncope.  She denies any TIA or stroke symptoms.         Vitals:    08/01/22 1052   BP: 102/58   BP Source: Arm, Left Upper   Pulse: 90   SpO2: 93%   O2 Device: None (Room air)   PainSc: Zero   Weight: 98.4 kg (217 lb)   Height: 170.2 cm (5' 7)     Body mass index is 33.99 kg/m?Hailey Rodgers     Past Medical History  Patient Active Problem List    Diagnosis Date Noted   ? Flare of rheumatoid arthritis (HCC) 05/23/2022   ? Chest pain 05/22/2022   ? Chronic heart failure with preserved ejection fraction (HCC) 05/02/2022   ? Rheumatoid arthritis involving multiple sites with positive rheumatoid factor (HCC) 04/24/2022   ? Pain in both lower extremities 08/04/2021   ? Joint pain 05/22/2021   ? Primary osteoarthritis involving multiple joints 01/05/2021   ? High risk medication use 01/04/2021   ? Rheumatoid arthritis flare (HCC) 11/09/2020   ? Syncope 10/25/2020   ? Seasonal allergic rhinitis 10/25/2020   ? Essential hypertension 10/25/2020   ? Coronary artery calcification 10/25/2020   ? Rheumatoid arthritis (HCC) 10/06/2020   ? 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 Age-related physical debility 10/06/2020   ? Gastroesophageal reflux disease without esophagitis 10/06/2020   ? Mixed hyperlipidemia 10/06/2020   ? COVID-19 virus infection 10/06/2020     06/2020     ? Diverticulosis 10/06/2020   ? Fatty liver disease, nonalcoholic    ? Frequent falls 06/27/2020   ? Class 1 obesity due to excess calories in adult 03/09/2020   ? Memory impairment 09/30/2019   ? Seborrheic keratosis 11/09/2018   ? Intrinsic atopic dermatitis 11/09/2018   ? Poor sleep 01/26/2018   ? Family problems 01/26/2018   ? Panic disorder without agoraphobia 01/26/2018   ? Actinic keratosis 01/05/2018   ? Cervical radiculopathy at C8 12/30/2017   ? Cubital tunnel syndrome on left 12/30/2017   ? Persistent depressive disorder with anxious distress, currently moderate 10/21/2017   ?  Pulmonary nodule, left 02/04/2017     10/06/2020 5. ?Right upper lobe groundglass nodule measuring 5 mm which may be   infectious or inflammatory. Multiple additional sub-5 mm nodules   bilaterally, most lymph nodes or granulomas. Follow-up CT in 12 months   recommended for further evaluation if clinically indicated.     Last Assessment & Plan:   Formatting of this note might be different from the original.  Incidentally noted on the CT chest 4 mm nodule.  Repeat CT scan in 12 months     ? Dysphagia 02/04/2017     Last Assessment & Plan:   Formatting of this note might be different from the original.  Patient was going outpatient evaluation for her dysphagia and had EGD done today,  EGD 02/04/17  Impressions:    Abnormal Motility- tertiary contractions noted. (Dilation (empiric) with no  subtle rings or strictures, Biopsy (to exclude EoE).    Normal mucosa in the whole stomach.    Normal mucosa in the whole examined duodenum.    Further evaluation per gastroenterology.     ? Dermatochalasis of eyelids of both eyes 06/11/2016   ? De Quervain's tenosynovitis, left 11/20/2015   ? Ptosis of both eyelids 11/06/2015   ? Pseudoarthrosis of cervical spine (HCC) 10/23/2015   ? Spinal stenosis of lumbar region with neurogenic claudication 10/23/2015   ? Spondylolisthesis of lumbar region 10/23/2015   ? 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 Generalized anxiety disorder 07/25/2015   ? Spondylolisthesis 07/10/2015   ? Myopia of both eyes with astigmatism and presbyopia 07/06/2015   ? Dermatochalasis of both upper eyelids 07/06/2015   ? 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 Cataract 05/11/2015   ? Dry eye 05/11/2015   ? MGD (meibomian gland dysfunction) 05/11/2015   ? Cervical stenosis of spine 04/10/2015   ? Stress incontinence 03/30/2015   ? Aortic insufficiency 03/27/2015   ? 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 Multilevel spinal stenosis 02/11/2015   ? Insufficient sleep syndrome 02/07/2015   ? Hypersomnia with sleep apnea 02/07/2015   ? Polyneuropathy associated with underlying disease (HCC) 09/12/2014   ? Insulin dependent type 2 diabetes mellitus (HCC) 05/04/2014   ? Obstructive sleep apnea 09/30/2013     Split Night 06/27/21 AHI 102.5 <88% 13.1min  pt did not want    Pt set up with BiPAP @ 18/14cm H2O on 06/04/22.  DME: [facility] [place] / [place]  Insurance: Medicare  If the patient has to meet compliance, their compliance window will be from 08/04/22 to 09/01/22.     ?     ? Restless legs syndrome (RLS) 09/30/2013   ? Hypothyroidism (acquired) 06/08/2012         Review of Systems   Constitutional: Negative.   HENT: Negative.    Eyes: Negative.    Cardiovascular: Negative.    Respiratory: Negative.    Endocrine: Negative.    Hematologic/Lymphatic: Negative.    Skin: Negative.    Musculoskeletal: Negative.    Gastrointestinal: Negative.    Genitourinary: Negative.    Neurological: Negative.    Psychiatric/Behavioral: Negative.    Allergic/Immunologic: Negative.        Physical Exam    Physical Exam   General Appearance: no distress, sleeping   Skin: warm, no ulcers or xanthomas   Digits and Nails: no cyanosis or clubbing   Eyes: conjunctivae and lids normal, pupils are equal and round   Teeth/Gums/Palate: dentition unremarkable, no  lesions   Lips & Oral Mucosa: no pallor or cyanosis   Neck Veins: normal JVP , neck veins are not distended   Thyroid: no nodules, masses, tenderness or enlargement   Chest Inspection: chest is normal in appearance   Respiratory Effort: breathing comfortably, no respiratory distress   Auscultation/Percussion: lungs clear to auscultation, no rales or rhonchi, no wheezing   PMI: PMI not enlarged or displaced   Cardiac Rhythm: regular rhythm and normal rate   Cardiac Auscultation: S1, S2 normal, no rub, no gallop   Murmurs: no murmur   Peripheral Circulation: normal peripheral circulation   Carotid Arteries: normal carotid upstroke bilaterally, no bruits   Radial Arteries: normal symmetric radial pulses   Abdominal Aorta: no abdominal aortic bruit   Pedal Pulses: normal symmetric pedal pulses   Lower Extremity Edema: no lower extremity edema   Abdominal Exam: soft, non-tender, no masses, bowel sounds normal   Liver & Spleen: no organomegaly   Gait & Station: walks without assistance   Muscle Strength: normal muscle tone   Orientation: oriented to time, place and person   Affect & Mood: appropriate and sustained affect   Language and Memory: patient responsive and seems to comprehend information   Neurologic Exam: neurological assessment grossly intact   Other: moves all extremities        Cardiovascular Health Factors  Vitals BP Readings from Last 3 Encounters:   08/01/22 102/58   07/22/22 117/66   06/21/22 132/71     Wt Readings from Last 3 Encounters: 08/01/22 98.4 kg (217 lb)   07/22/22 97.8 kg (215 lb 9.6 oz)   06/21/22 96.2 kg (212 lb)     BMI Readings from Last 3 Encounters:   08/01/22 33.99 kg/m?   07/22/22 33.77 kg/m?   06/21/22 33.20 kg/m?      Smoking Social History     Tobacco Use   Smoking Status Never   Smokeless Tobacco Never   Vaping Use   ? Vaping Use: Never used      Lipid Profile Cholesterol   Date Value Ref Range Status   04/30/2022 122  Final     HDL   Date Value Ref Range Status   04/30/2022 43  Final     LDL   Date Value Ref Range Status   04/30/2022 61  Final     Triglycerides   Date Value Ref Range Status   04/30/2022 91  Final      Blood Sugar Hemoglobin A1C   Date Value Ref Range Status   08/04/2021 8.3 (H) 4.0 - 6.0 % Final     Comment:     The ADA recommends that most patients with type 1 and type 2 diabetes maintain   an A1c level <7%.       Glucose   Date Value Ref Range Status   07/08/2022 209 (H) 70 - 105 Final   06/21/2022 171 (H) 70 - 100 MG/DL Final   16/09/9603 540 (H) 70 - 100 MG/DL Final   98/10/9146 90 70 - 110 MG/DL Final     Glucose, POC   Date Value Ref Range Status   05/23/2022 101 (H) 70 - 100 MG/DL Final   82/95/6213 73 70 - 100 MG/DL Final   08/65/7846 69 (L) 70 - 100 MG/DL Final          Problems Addressed Today  Encounter Diagnoses   Name Primary?   ? Screening for heart disease    ? Mixed hyperlipidemia    ?  Chronic heart failure with preserved ejection fraction (HCC)    ? History of cardiac edema    ? Coronary artery calcification    ? Obstructive sleep apnea    ? Essential hypertension        Assessment and Plan       Coronary artery calcification  She had a nonischemic stress test about a year ago and is not having angina symptoms.    Chronic heart failure with preserved ejection fraction (HCC)  Spironolactone added in June; lab to be drawn Monday.  She has an appointment in the Lymphedema Clinic at [facility] next week.    Obstructive sleep apnea  She started BIPAP in June and her daughter says she's using it, but the patient was sound asleep in the exam room today while her daughter and I visited.    Mixed hyperlipidemia  She has an order for a lipid profile to be drawn on Monday.  She has diabetes as well as coronary artery calcification so I am pretty sure we will be telling her to restart the rosuvastatin.    Essential hypertension  Her blood pressure in clinic today was actually a little bit low.  They have not been checking home blood pressure and I asked her daughter to check the blood pressure a bit more regularly.  Certainly a component of the patient's fatigue may be that her blood pressure is too low and perhaps we can back off on some of her medication.      Current Medications (including today's revisions)  ? acetaminophen (TYLENOL EXTRA STRENGTH) 500 mg tablet Take two tablets by mouth every 6 hours as needed. Max of 4,000 mg of acetaminophen in 24 hours.  Indications: pain   ? aspirin 81 mg chewable tablet Chew one tablet by mouth at bedtime daily.   ? blood-glucose sensor (DEXCOM G6 SENSOR MISC) Use  as directed.   ? calcium carbonate (CALTRATE) 600 mg calcium (1,500 mg) tablet Take two tablets by mouth twice daily.   ? celecoxib (CELEBREX) 200 mg capsule Take one capsule by mouth twice daily.   ? CHOLEcalciferoL (vitamin D3) (OPTIMAL D3) 50,000 units capsule Take one capsule by mouth every 7 days.   ? cyclobenzaprine (FLEXERIL) 5 mg tablet Take one tablet by mouth three times daily as needed.   ? docusate (COLACE) 100 mg capsule Take two capsules by mouth twice daily.   ? dulaglutide (TRULICITY) 1.5 mg/0.5 mL injection pen Inject 0.5 mL under the skin every Wednesday.   ? famotidine (PEPCID) 40 mg tablet Take one tablet by mouth at bedtime daily.   ? ferrous sulfate (FEOSOL) 325 mg (65 mg iron) tablet Take one tablet by mouth twice daily. Take on an empty stomach at least 1 hour before or 2 hours after food.   ? fexofenadine(+) (ALLEGRA) 180 mg tablet Take one tablet by mouth daily.   ? fluocinolone acetonide oil (DERMOTIC OIL) 0.01 % otic drops INSTILL 3 DROPS INTO NOSE DAILY AS NEEDED   ? folic acid (FOLVITE) 1 mg tablet Take four tablets by mouth daily. (Patient taking differently: Take four tablets by mouth at bedtime daily.)   ? furosemide (LASIX) 40 mg tablet Take one tablet by mouth every morning. Take 1 tablet (40 mg) by mouth in the morning   ? HYDROcodone/acetaminophen (NORCO) 5/325 mg tablet Take one tablet by mouth every 4 hours as needed for Pain.   ? insulin aspart (U-100) (NOVOLOG FLEXPEN U-100 INSULIN) 100 unit/mL (3 mL) PEN Inject 30  units under the skin prior to meals plus add on sliding scale 201-250 = 3 units, 251-300 = 6 units, greater than 300 = 9 units, 117 units daily max.   ? insulin degludec (TRESIBA FLEXTOUCH U-200) 200 unit/mL (3 mL) suncutaneous PEN Use as directed. Inject up to 100 Units per day. (Patient taking differently: Inject one hundred Units under the skin at bedtime daily.)   ? Insulin Needles (Disposable) (NANO PEN NEEDLE) 32 gauge x 5/32 ndle Use 1 Each as directed before meals and at bedtime.   ? JARDIANCE 25 mg tablet Take one tablet by mouth daily.   ? lactobacillus rhamnosus GG (CULTURELLE) 15 billion cell capsule Take one capsule by mouth as directed daily.   ? levothyroxine (SYNTHROID) 88 mcg tablet Take one tablet by mouth daily.   ? methotrexate sodium 2.5 mg tablet Take eight tablets by mouth every 7 days.   ? mirabegron (MYRBETRIQ) 50 mg ER tablet Take one tablet by mouth daily.   ? nitroglycerin (NITROSTAT) 0.4 mg tablet Place one tablet under tongue every 5 minutes as needed for Chest Pain. Max 3 tablets, call 911.   ? nortriptyline (PAMELOR) 25 mg capsule Take one capsule by mouth at bedtime daily.   ? nystatin (MYCOSTATIN) 100,000 unit/g topical cream Apply  topically to affected area twice daily as needed.   ? NYSTOP 100,000 unit/gram topical powder Apply to clean, dry skin 2-3 times daily for yeast infection   ? ondansetron (ZOFRAN ODT) 8 mg rapid dissolve tablet Dissolve one tablet by mouth every 8 hours as needed.   ? pantoprazole DR (PROTONIX) 40 mg tablet Take one tablet by mouth twice daily.   ? polyethylene glycol 3350 (MIRALAX) 17 g packet Take one packet by mouth twice daily as needed.   ? pramipexole (MIRAPEX) 0.75 mg tablet Take one tablet by mouth three times daily.   ? spironolactone (ALDACTONE) 25 mg tablet Take one tablet by mouth daily. Take with food.   ? triamcinolone acetonide 0.5 % ointment Apply  topically to affected area three times daily as needed.   ? venlafaxine XR (EFFEXOR XR) 150 mg capsule Take one capsule by mouth daily.   ? vibegron (GEMTESA) 75 mg tablet Take one tablet by mouth at bedtime daily.   ? vitamins, multi w/minerals 9 mg iron-400 mcg tab Take one tablet by mouth daily.     Total time spent on today's office visit was 45 minutes.  This includes face-to-face in person visit with patient as well as nonface-to-face time including review of the EMR, outside records, labs, radiologic studies, echocardiogram & other cardiovascular studies, formation of treatment plan, after visit summary, future disposition, and lastly on documentation.

## 2022-08-01 NOTE — Assessment & Plan Note
Her blood pressure in clinic today was actually a little bit low.  They have not been checking home blood pressure and I asked her daughter to check the blood pressure a bit more regularly.  Certainly a component of the patient's fatigue may be that her blood pressure is too low and perhaps we can back off on some of her medication.

## 2022-08-01 NOTE — Assessment & Plan Note
Continue tears, encouraged to incrase 4-6 times daily

## 2022-08-01 NOTE — Assessment & Plan Note
Pt with visually significan cataract but not bothered at this time. Will conitnue to monitor for disruptions of ADLs

## 2022-08-01 NOTE — Assessment & Plan Note
She started BIPAP in June and her daughter says she's using it, but the patient was sound asleep in the exam room today while her daughter and I visited.

## 2022-08-01 NOTE — Assessment & Plan Note
She has an order for a lipid profile to be drawn on Monday.  She has diabetes as well as coronary artery calcification so I am pretty sure we will be telling her to restart the rosuvastatin.

## 2022-08-01 NOTE — Assessment & Plan Note
Does the patient have Diabetes? Yes,  and the patient HAS evidence of retinopathy and/or macular edema today.   Mild on exam  Encouraged patient to control blood glucose, blood pressure and cholesterol. Discussed goal a1c of < 7.0 to reduce risk of developing or worsening diabetic retinopathy.

## 2022-08-02 ENCOUNTER — Encounter: Admit: 2022-08-02 | Discharge: 2022-08-02 | Payer: MEDICARE

## 2022-08-05 ENCOUNTER — Encounter: Admit: 2022-08-05 | Discharge: 2022-08-05 | Payer: MEDICARE

## 2022-08-05 ENCOUNTER — Ambulatory Visit: Admit: 2022-08-05 | Discharge: 2022-08-06 | Payer: MEDICARE

## 2022-08-05 DIAGNOSIS — G479 Sleep disorder, unspecified: Secondary | ICD-10-CM

## 2022-08-05 DIAGNOSIS — E1165 Type 2 diabetes mellitus with hyperglycemia: Secondary | ICD-10-CM

## 2022-08-05 DIAGNOSIS — G4733 Obstructive sleep apnea (adult) (pediatric): Secondary | ICD-10-CM

## 2022-08-05 DIAGNOSIS — K5790 Diverticulosis of intestine, part unspecified, without perforation or abscess without bleeding: Secondary | ICD-10-CM

## 2022-08-05 DIAGNOSIS — H547 Unspecified visual loss: Secondary | ICD-10-CM

## 2022-08-05 DIAGNOSIS — M069 Rheumatoid arthritis, unspecified: Secondary | ICD-10-CM

## 2022-08-05 DIAGNOSIS — J309 Allergic rhinitis, unspecified: Secondary | ICD-10-CM

## 2022-08-05 DIAGNOSIS — M0579 Rheumatoid arthritis with rheumatoid factor of multiple sites without organ or systems involvement: Secondary | ICD-10-CM

## 2022-08-05 DIAGNOSIS — Z79899 Other long term (current) drug therapy: Secondary | ICD-10-CM

## 2022-08-05 DIAGNOSIS — E039 Hypothyroidism, unspecified: Secondary | ICD-10-CM

## 2022-08-05 DIAGNOSIS — F3341 Major depressive disorder, recurrent, in partial remission: Secondary | ICD-10-CM

## 2022-08-05 DIAGNOSIS — E034 Atrophy of thyroid (acquired): Secondary | ICD-10-CM

## 2022-08-05 DIAGNOSIS — M1990 Unspecified osteoarthritis, unspecified site: Secondary | ICD-10-CM

## 2022-08-05 DIAGNOSIS — F445 Conversion disorder with seizures or convulsions: Secondary | ICD-10-CM

## 2022-08-05 DIAGNOSIS — G47419 Narcolepsy without cataplexy: Secondary | ICD-10-CM

## 2022-08-05 DIAGNOSIS — K219 Gastro-esophageal reflux disease without esophagitis: Secondary | ICD-10-CM

## 2022-08-05 DIAGNOSIS — R413 Other amnesia: Secondary | ICD-10-CM

## 2022-08-05 DIAGNOSIS — R32 Unspecified urinary incontinence: Secondary | ICD-10-CM

## 2022-08-05 DIAGNOSIS — K760 Fatty (change of) liver, not elsewhere classified: Secondary | ICD-10-CM

## 2022-08-05 DIAGNOSIS — E669 Obesity, unspecified: Secondary | ICD-10-CM

## 2022-08-05 DIAGNOSIS — J45909 Unspecified asthma, uncomplicated: Secondary | ICD-10-CM

## 2022-08-05 DIAGNOSIS — E7849 Other hyperlipidemia: Secondary | ICD-10-CM

## 2022-08-05 DIAGNOSIS — M4802 Spinal stenosis, cervical region: Secondary | ICD-10-CM

## 2022-08-05 DIAGNOSIS — I5032 Chronic diastolic (congestive) heart failure: Secondary | ICD-10-CM

## 2022-08-05 DIAGNOSIS — R54 Age-related physical debility: Secondary | ICD-10-CM

## 2022-08-05 DIAGNOSIS — E785 Hyperlipidemia, unspecified: Secondary | ICD-10-CM

## 2022-08-05 DIAGNOSIS — R3989 Other symptoms and signs involving the genitourinary system: Secondary | ICD-10-CM

## 2022-08-05 DIAGNOSIS — R29898 Other symptoms and signs involving the musculoskeletal system: Secondary | ICD-10-CM

## 2022-08-05 DIAGNOSIS — M48061 Spinal stenosis, lumbar region without neurogenic claudication: Secondary | ICD-10-CM

## 2022-08-05 DIAGNOSIS — G629 Polyneuropathy, unspecified: Secondary | ICD-10-CM

## 2022-08-05 DIAGNOSIS — E119 Type 2 diabetes mellitus without complications: Secondary | ICD-10-CM

## 2022-08-05 DIAGNOSIS — U071 COVID-19 virus infection: Secondary | ICD-10-CM

## 2022-08-05 DIAGNOSIS — R4189 Other symptoms and signs involving cognitive functions and awareness: Secondary | ICD-10-CM

## 2022-08-05 DIAGNOSIS — R911 Solitary pulmonary nodule: Secondary | ICD-10-CM

## 2022-08-05 DIAGNOSIS — J40 Bronchitis, not specified as acute or chronic: Secondary | ICD-10-CM

## 2022-08-05 LAB — BASIC METABOLIC PANEL
ANION GAP: 11 (ref 3–12)
BLD UREA NITROGEN: 31 mg/dL — ABNORMAL HIGH (ref 7–25)
CALCIUM: 9.2 mg/dL (ref 8.5–10.6)
CHLORIDE: 101 MMOL/L (ref 98–110)
CO2: 24 MMOL/L (ref 21–30)
CREATININE: 0.9 mg/dL (ref 0.4–1.00)
EGFR: >60 mL/min (ref >60)
GLUCOSE,PANEL: 209 mg/dL — ABNORMAL HIGH (ref 70–100)
POTASSIUM: 3.8 MMOL/L (ref 3.5–5.1)
SODIUM: 136 MMOL/L — ABNORMAL LOW (ref 137–147)

## 2022-08-05 MED ORDER — ABATACEPT IVPB
750 mg | Freq: Once | INTRAVENOUS | 0 refills
Start: 2022-08-05 — End: ?

## 2022-08-05 MED ORDER — ABATACEPT IVPB
750 mg | Freq: Once | INTRAVENOUS | 0 refills | Status: CP
Start: 2022-08-05 — End: ?
  Administered 2022-08-05 (×2): 750 mg via INTRAVENOUS

## 2022-08-05 NOTE — Patient Instructions
Abatacept (Orencia) Side Effects:  The following is a list of some common side effects from this medicine. Please speak with your doctor about what you should do if you experience these or other side effects.  increased risk of cancer  headaches  pain, redness, swelling near injection  nausea  runny nose  sore throat  weight loss  Call your doctor or get medical help right away if you notice any of these more serious side effects:  cough that does not go away  fever or chills  unusual growth or lump on the neck  shortness of breath  skin changes - a darker area with uneven edges or coloring  change in the size or color of a skin mole  sweating at night  unusual or unexplained tiredness or weakness  A few people may have an allergic reaction to this medicine. Symptoms can include difficulty breathing, skin rash, itching, swelling, or severe dizziness. If you notice any of these symptoms, seek medical help quickly.

## 2022-08-05 NOTE — Progress Notes
Patient in Outpatient Infusion Clinic this date for Abatacept (Orencia) infusion. Patient completed 750 mg Orencia without complication and without symptoms of Abatacept (Orencia) infusion reaction.     Patient given printed After Visit Summary. Abatacept Injection (English) attached to patient's printed After Visit Summary. Abatacept (Orencia) side effects attached to printed After Visit Summary Patient Instructions as follows:  Abatacept (Orencia) Side Effects:  The following is a list of some common side effects from this medicine. Please speak with your doctor about what you should do if you experience these or other side effects.   increased risk of cancer   headaches   pain, redness, swelling near injection   nausea   runny nose   sore throat   weight loss  Call your doctor or get medical help right away if you notice any of these more serious side effects:   cough that does not go away   fever or chills   unusual growth or lump on the neck   shortness of breath   skin changes - a darker area with uneven edges or coloring   change in the size or color of a skin mole   sweating at night   unusual or unexplained tiredness or weakness  A few people may have an allergic reaction to this medicine. Symptoms can include difficulty breathing, skin rash, itching, swelling, or severe dizziness. If you notice any of these symptoms, seek medical help quickly.    Patient tolerated all care without complication and denied needs or questions at completion of appointment this date.

## 2022-08-06 ENCOUNTER — Encounter: Admit: 2022-08-06 | Discharge: 2022-08-06 | Payer: MEDICARE

## 2022-08-07 NOTE — Patient Instructions
Durable Medical Equipment (DME) orders take 7-14 days to process with your DME. If your DME does not have your orders after 2 weeks please let us know. Thank you for your patience.    - If you need to schedule or reschedule an appointment, please call [phone].     -For refills on medications, please have your pharmacy fax a refill authorization request form to our office at [phone]. Please allow at least 3 business days for refill requests.    - For any urgent issues after business hours, please call [phone] and have the on call pulmonary physician paged.      - If you have any questions regarding your appointment or any concerns, please contact Solstice Lastinger Austin-Ellis, RN at [phone].

## 2022-08-08 ENCOUNTER — Encounter: Admit: 2022-08-08 | Discharge: 2022-08-08 | Payer: MEDICARE

## 2022-08-12 ENCOUNTER — Encounter: Admit: 2022-08-12 | Discharge: 2022-08-12 | Payer: MEDICARE

## 2022-08-12 MED ORDER — FOLIC ACID 1 MG PO TAB
4 mg | ORAL_TABLET | Freq: Every day | ORAL | 3 refills
Start: 2022-08-12 — End: ?

## 2022-08-13 ENCOUNTER — Encounter: Admit: 2022-08-13 | Discharge: 2022-08-13 | Payer: MEDICARE

## 2022-08-14 ENCOUNTER — Encounter: Admit: 2022-08-14 | Discharge: 2022-08-14 | Payer: MEDICARE

## 2022-08-14 ENCOUNTER — Ambulatory Visit: Admit: 2022-08-14 | Discharge: 2022-08-15 | Payer: MEDICARE

## 2022-08-14 DIAGNOSIS — M0579 Rheumatoid arthritis with rheumatoid factor of multiple sites without organ or systems involvement: Secondary | ICD-10-CM

## 2022-08-14 DIAGNOSIS — R29898 Other symptoms and signs involving the musculoskeletal system: Secondary | ICD-10-CM

## 2022-08-14 DIAGNOSIS — K760 Fatty (change of) liver, not elsewhere classified: Secondary | ICD-10-CM

## 2022-08-14 DIAGNOSIS — U071 COVID-19 virus infection: Secondary | ICD-10-CM

## 2022-08-14 DIAGNOSIS — Z8719 Personal history of other diseases of the digestive system: Secondary | ICD-10-CM

## 2022-08-14 DIAGNOSIS — E785 Hyperlipidemia, unspecified: Secondary | ICD-10-CM

## 2022-08-14 DIAGNOSIS — R54 Age-related physical debility: Secondary | ICD-10-CM

## 2022-08-14 DIAGNOSIS — Z79899 Other long term (current) drug therapy: Secondary | ICD-10-CM

## 2022-08-14 DIAGNOSIS — M48061 Spinal stenosis, lumbar region without neurogenic claudication: Secondary | ICD-10-CM

## 2022-08-14 DIAGNOSIS — J309 Allergic rhinitis, unspecified: Secondary | ICD-10-CM

## 2022-08-14 DIAGNOSIS — I7300 Raynaud's syndrome without gangrene: Secondary | ICD-10-CM

## 2022-08-14 DIAGNOSIS — E039 Hypothyroidism, unspecified: Secondary | ICD-10-CM

## 2022-08-14 DIAGNOSIS — G479 Sleep disorder, unspecified: Secondary | ICD-10-CM

## 2022-08-14 DIAGNOSIS — G4733 Obstructive sleep apnea (adult) (pediatric): Secondary | ICD-10-CM

## 2022-08-14 DIAGNOSIS — E119 Type 2 diabetes mellitus without complications: Secondary | ICD-10-CM

## 2022-08-14 DIAGNOSIS — R32 Unspecified urinary incontinence: Secondary | ICD-10-CM

## 2022-08-14 DIAGNOSIS — K5790 Diverticulosis of intestine, part unspecified, without perforation or abscess without bleeding: Secondary | ICD-10-CM

## 2022-08-14 DIAGNOSIS — J40 Bronchitis, not specified as acute or chronic: Secondary | ICD-10-CM

## 2022-08-14 DIAGNOSIS — D849 Immunodeficiency, unspecified: Secondary | ICD-10-CM

## 2022-08-14 DIAGNOSIS — E1165 Type 2 diabetes mellitus with hyperglycemia: Secondary | ICD-10-CM

## 2022-08-14 DIAGNOSIS — F3341 Major depressive disorder, recurrent, in partial remission: Secondary | ICD-10-CM

## 2022-08-14 DIAGNOSIS — R3989 Other symptoms and signs involving the genitourinary system: Secondary | ICD-10-CM

## 2022-08-14 DIAGNOSIS — E669 Obesity, unspecified: Secondary | ICD-10-CM

## 2022-08-14 DIAGNOSIS — J45909 Unspecified asthma, uncomplicated: Secondary | ICD-10-CM

## 2022-08-14 DIAGNOSIS — R911 Solitary pulmonary nodule: Secondary | ICD-10-CM

## 2022-08-14 DIAGNOSIS — E034 Atrophy of thyroid (acquired): Secondary | ICD-10-CM

## 2022-08-14 DIAGNOSIS — G629 Polyneuropathy, unspecified: Secondary | ICD-10-CM

## 2022-08-14 DIAGNOSIS — M1990 Unspecified osteoarthritis, unspecified site: Secondary | ICD-10-CM

## 2022-08-14 DIAGNOSIS — M069 Rheumatoid arthritis, unspecified: Secondary | ICD-10-CM

## 2022-08-14 DIAGNOSIS — R413 Other amnesia: Secondary | ICD-10-CM

## 2022-08-14 DIAGNOSIS — M4802 Spinal stenosis, cervical region: Secondary | ICD-10-CM

## 2022-08-14 DIAGNOSIS — K219 Gastro-esophageal reflux disease without esophagitis: Secondary | ICD-10-CM

## 2022-08-14 DIAGNOSIS — Z5181 Encounter for therapeutic drug level monitoring: Secondary | ICD-10-CM

## 2022-08-14 DIAGNOSIS — H547 Unspecified visual loss: Secondary | ICD-10-CM

## 2022-08-14 DIAGNOSIS — E7849 Other hyperlipidemia: Secondary | ICD-10-CM

## 2022-08-14 DIAGNOSIS — R4189 Other symptoms and signs involving cognitive functions and awareness: Secondary | ICD-10-CM

## 2022-08-14 DIAGNOSIS — G47419 Narcolepsy without cataplexy: Secondary | ICD-10-CM

## 2022-08-14 DIAGNOSIS — F445 Conversion disorder with seizures or convulsions: Secondary | ICD-10-CM

## 2022-08-14 MED FILL — FOLIC ACID 1 MG PO TAB: 1 mg | ORAL | 30 days supply | Qty: 120 | Fill #1 | Status: CP

## 2022-08-14 NOTE — Patient Instructions
Please continue your methotrexate and abatacept infusions    Please get your blood tests every 3 months    Please follow up with your podiatrist if your right pinky toe looks worse or appears infected. If there is concern for infection please let us know and temporarily hold your methotrexate and abatacept

## 2022-08-15 ENCOUNTER — Encounter: Admit: 2022-08-15 | Discharge: 2022-08-15 | Payer: MEDICARE

## 2022-08-15 MED ORDER — CYCLOBENZAPRINE 5 MG PO TAB
5 mg | ORAL_TABLET | Freq: Three times a day (TID) | ORAL | 5 refills | 30.00000 days | Status: AC | PRN
Start: 2022-08-15 — End: ?
  Filled 2022-08-21: qty 60, 20d supply, fill #1

## 2022-08-19 ENCOUNTER — Encounter: Admit: 2022-08-19 | Discharge: 2022-08-19 | Payer: MEDICARE

## 2022-08-19 ENCOUNTER — Ambulatory Visit: Admit: 2022-08-19 | Discharge: 2022-08-19 | Payer: MEDICARE

## 2022-08-19 DIAGNOSIS — Z79899 Other long term (current) drug therapy: Secondary | ICD-10-CM

## 2022-08-19 DIAGNOSIS — M0579 Rheumatoid arthritis with rheumatoid factor of multiple sites without organ or systems involvement: Secondary | ICD-10-CM

## 2022-08-19 MED ORDER — ABATACEPT IVPB
750 mg | Freq: Once | INTRAVENOUS | 0 refills | Status: CN
Start: 2022-08-19 — End: ?

## 2022-08-19 MED ORDER — GEMTESA 75 MG PO TAB
75 mg | ORAL_TABLET | Freq: Every evening | ORAL | 3 refills
Start: 2022-08-19 — End: ?

## 2022-08-19 MED ORDER — ABATACEPT IVPB
750 mg | Freq: Once | INTRAVENOUS | 0 refills
Start: 2022-08-19 — End: ?

## 2022-08-19 MED ORDER — ABATACEPT IVPB
750 mg | Freq: Once | INTRAVENOUS | 0 refills | Status: CP
Start: 2022-08-19 — End: ?
  Administered 2022-08-19 (×2): 750 mg via INTRAVENOUS

## 2022-08-19 NOTE — Progress Notes
Patient presents to infusion clinic for abatacept infusion.  Patient denies recent concern for infection, fever, or antibiotic use.  Pre-infusion vital signs completed. Peripheral IV started.     ~0488: Infusion started, see e-mar for administration details    ~1700: Infusion completed and flushed. See e-mar for administration details and Post infusion vitals completed. No concern for infusion reaction. Patient has no complaints    ~1710: PIV discontinued. Patient provided with printed AVS. Prompted patient to schedule upcoming appointment(s) at infusion checkout or call infusion scheduling. Patient verbalized understanding. Patient ambulated off unit accompanied by caregiver.    Vitals:    08/19/22 1534 08/19/22 1655   BP: 120/55 (!) 149/72   BP Source: Arm, Right Upper Arm, Right Upper   Pulse: 90 87   Temp: 36.6 C (97.9 F)    Resp: 16    SpO2: 98% 95%   O2 Device: None (Room air)    TempSrc: Skin    Weight: 98.2 kg (216 lb 6.4 oz)    Height: 170.2 cm ('5\' 7"'$ )

## 2022-08-20 ENCOUNTER — Encounter: Admit: 2022-08-20 | Discharge: 2022-08-20 | Payer: MEDICARE

## 2022-08-21 ENCOUNTER — Encounter: Admit: 2022-08-21 | Discharge: 2022-08-21 | Payer: MEDICARE

## 2022-08-21 ENCOUNTER — Ambulatory Visit: Admit: 2022-08-21 | Discharge: 2022-08-21 | Payer: MEDICARE

## 2022-08-21 DIAGNOSIS — J45909 Unspecified asthma, uncomplicated: Secondary | ICD-10-CM

## 2022-08-21 DIAGNOSIS — F3341 Major depressive disorder, recurrent, in partial remission: Secondary | ICD-10-CM

## 2022-08-21 DIAGNOSIS — E1165 Type 2 diabetes mellitus with hyperglycemia: Secondary | ICD-10-CM

## 2022-08-21 DIAGNOSIS — E034 Atrophy of thyroid (acquired): Secondary | ICD-10-CM

## 2022-08-21 DIAGNOSIS — E785 Hyperlipidemia, unspecified: Secondary | ICD-10-CM

## 2022-08-21 DIAGNOSIS — R413 Other amnesia: Secondary | ICD-10-CM

## 2022-08-21 DIAGNOSIS — K5790 Diverticulosis of intestine, part unspecified, without perforation or abscess without bleeding: Secondary | ICD-10-CM

## 2022-08-21 DIAGNOSIS — H547 Unspecified visual loss: Secondary | ICD-10-CM

## 2022-08-21 DIAGNOSIS — G479 Sleep disorder, unspecified: Secondary | ICD-10-CM

## 2022-08-21 DIAGNOSIS — E119 Type 2 diabetes mellitus without complications: Secondary | ICD-10-CM

## 2022-08-21 DIAGNOSIS — J309 Allergic rhinitis, unspecified: Secondary | ICD-10-CM

## 2022-08-21 DIAGNOSIS — G629 Polyneuropathy, unspecified: Secondary | ICD-10-CM

## 2022-08-21 DIAGNOSIS — R3989 Other symptoms and signs involving the genitourinary system: Secondary | ICD-10-CM

## 2022-08-21 DIAGNOSIS — E669 Obesity, unspecified: Secondary | ICD-10-CM

## 2022-08-21 DIAGNOSIS — G4733 Obstructive sleep apnea (adult) (pediatric): Secondary | ICD-10-CM

## 2022-08-21 DIAGNOSIS — U071 COVID-19 virus infection: Secondary | ICD-10-CM

## 2022-08-21 DIAGNOSIS — R32 Unspecified urinary incontinence: Secondary | ICD-10-CM

## 2022-08-21 DIAGNOSIS — E039 Hypothyroidism, unspecified: Secondary | ICD-10-CM

## 2022-08-21 DIAGNOSIS — E7849 Other hyperlipidemia: Secondary | ICD-10-CM

## 2022-08-21 DIAGNOSIS — J40 Bronchitis, not specified as acute or chronic: Secondary | ICD-10-CM

## 2022-08-21 DIAGNOSIS — K760 Fatty (change of) liver, not elsewhere classified: Secondary | ICD-10-CM

## 2022-08-21 DIAGNOSIS — M4802 Spinal stenosis, cervical region: Secondary | ICD-10-CM

## 2022-08-21 DIAGNOSIS — R54 Age-related physical debility: Secondary | ICD-10-CM

## 2022-08-21 DIAGNOSIS — Z79899 Other long term (current) drug therapy: Secondary | ICD-10-CM

## 2022-08-21 DIAGNOSIS — M069 Rheumatoid arthritis, unspecified: Secondary | ICD-10-CM

## 2022-08-21 DIAGNOSIS — R29898 Other symptoms and signs involving the musculoskeletal system: Secondary | ICD-10-CM

## 2022-08-21 DIAGNOSIS — G47419 Narcolepsy without cataplexy: Secondary | ICD-10-CM

## 2022-08-21 DIAGNOSIS — R4189 Other symptoms and signs involving cognitive functions and awareness: Secondary | ICD-10-CM

## 2022-08-21 DIAGNOSIS — K219 Gastro-esophageal reflux disease without esophagitis: Secondary | ICD-10-CM

## 2022-08-21 DIAGNOSIS — M48061 Spinal stenosis, lumbar region without neurogenic claudication: Secondary | ICD-10-CM

## 2022-08-21 DIAGNOSIS — F445 Conversion disorder with seizures or convulsions: Secondary | ICD-10-CM

## 2022-08-21 DIAGNOSIS — R911 Solitary pulmonary nodule: Secondary | ICD-10-CM

## 2022-08-21 DIAGNOSIS — M1990 Unspecified osteoarthritis, unspecified site: Secondary | ICD-10-CM

## 2022-08-28 ENCOUNTER — Encounter: Admit: 2022-08-28 | Discharge: 2022-08-28 | Payer: MEDICARE

## 2022-08-28 DIAGNOSIS — E785 Hyperlipidemia, unspecified: Secondary | ICD-10-CM

## 2022-08-28 DIAGNOSIS — E119 Type 2 diabetes mellitus without complications: Secondary | ICD-10-CM

## 2022-08-28 DIAGNOSIS — E1165 Type 2 diabetes mellitus with hyperglycemia: Secondary | ICD-10-CM

## 2022-08-28 DIAGNOSIS — M48061 Spinal stenosis, lumbar region without neurogenic claudication: Secondary | ICD-10-CM

## 2022-08-28 DIAGNOSIS — E669 Obesity, unspecified: Secondary | ICD-10-CM

## 2022-08-28 DIAGNOSIS — R911 Solitary pulmonary nodule: Secondary | ICD-10-CM

## 2022-08-28 DIAGNOSIS — E039 Hypothyroidism, unspecified: Secondary | ICD-10-CM

## 2022-08-28 DIAGNOSIS — G629 Polyneuropathy, unspecified: Secondary | ICD-10-CM

## 2022-08-28 DIAGNOSIS — R3989 Other symptoms and signs involving the genitourinary system: Secondary | ICD-10-CM

## 2022-08-28 DIAGNOSIS — R29898 Other symptoms and signs involving the musculoskeletal system: Secondary | ICD-10-CM

## 2022-08-28 DIAGNOSIS — J309 Allergic rhinitis, unspecified: Secondary | ICD-10-CM

## 2022-08-28 DIAGNOSIS — G47419 Narcolepsy without cataplexy: Secondary | ICD-10-CM

## 2022-08-28 DIAGNOSIS — K219 Gastro-esophageal reflux disease without esophagitis: Secondary | ICD-10-CM

## 2022-08-28 DIAGNOSIS — F3341 Major depressive disorder, recurrent, in partial remission: Secondary | ICD-10-CM

## 2022-08-28 DIAGNOSIS — J45909 Unspecified asthma, uncomplicated: Secondary | ICD-10-CM

## 2022-08-28 DIAGNOSIS — K760 Fatty (change of) liver, not elsewhere classified: Secondary | ICD-10-CM

## 2022-08-28 DIAGNOSIS — R413 Other amnesia: Secondary | ICD-10-CM

## 2022-08-28 DIAGNOSIS — M1990 Unspecified osteoarthritis, unspecified site: Secondary | ICD-10-CM

## 2022-08-28 DIAGNOSIS — M069 Rheumatoid arthritis, unspecified: Secondary | ICD-10-CM

## 2022-08-28 DIAGNOSIS — R32 Unspecified urinary incontinence: Secondary | ICD-10-CM

## 2022-08-28 DIAGNOSIS — M4802 Spinal stenosis, cervical region: Secondary | ICD-10-CM

## 2022-08-28 DIAGNOSIS — R4189 Other symptoms and signs involving cognitive functions and awareness: Secondary | ICD-10-CM

## 2022-08-28 DIAGNOSIS — U071 COVID-19 virus infection: Secondary | ICD-10-CM

## 2022-08-28 DIAGNOSIS — G479 Sleep disorder, unspecified: Secondary | ICD-10-CM

## 2022-08-28 DIAGNOSIS — J40 Bronchitis, not specified as acute or chronic: Secondary | ICD-10-CM

## 2022-08-28 DIAGNOSIS — F445 Conversion disorder with seizures or convulsions: Secondary | ICD-10-CM

## 2022-08-28 DIAGNOSIS — G4733 Obstructive sleep apnea (adult) (pediatric): Secondary | ICD-10-CM

## 2022-08-28 DIAGNOSIS — K5790 Diverticulosis of intestine, part unspecified, without perforation or abscess without bleeding: Secondary | ICD-10-CM

## 2022-08-28 DIAGNOSIS — E7849 Other hyperlipidemia: Secondary | ICD-10-CM

## 2022-08-28 DIAGNOSIS — Z79899 Other long term (current) drug therapy: Secondary | ICD-10-CM

## 2022-08-28 DIAGNOSIS — H547 Unspecified visual loss: Secondary | ICD-10-CM

## 2022-08-28 DIAGNOSIS — E034 Atrophy of thyroid (acquired): Secondary | ICD-10-CM

## 2022-08-28 DIAGNOSIS — R54 Age-related physical debility: Secondary | ICD-10-CM

## 2022-08-30 ENCOUNTER — Encounter: Admit: 2022-08-30 | Discharge: 2022-08-30 | Payer: MEDICARE

## 2022-08-30 DIAGNOSIS — J45909 Unspecified asthma, uncomplicated: Secondary | ICD-10-CM

## 2022-08-30 DIAGNOSIS — F3341 Major depressive disorder, recurrent, in partial remission: Secondary | ICD-10-CM

## 2022-08-30 DIAGNOSIS — H547 Unspecified visual loss: Secondary | ICD-10-CM

## 2022-08-30 DIAGNOSIS — M069 Rheumatoid arthritis, unspecified: Secondary | ICD-10-CM

## 2022-08-30 DIAGNOSIS — J309 Allergic rhinitis, unspecified: Secondary | ICD-10-CM

## 2022-08-30 DIAGNOSIS — E669 Obesity, unspecified: Secondary | ICD-10-CM

## 2022-08-30 DIAGNOSIS — R4189 Other symptoms and signs involving cognitive functions and awareness: Secondary | ICD-10-CM

## 2022-08-30 DIAGNOSIS — M1990 Unspecified osteoarthritis, unspecified site: Secondary | ICD-10-CM

## 2022-08-30 DIAGNOSIS — M48061 Spinal stenosis, lumbar region without neurogenic claudication: Secondary | ICD-10-CM

## 2022-08-30 DIAGNOSIS — G47419 Narcolepsy without cataplexy: Secondary | ICD-10-CM

## 2022-08-30 DIAGNOSIS — E034 Atrophy of thyroid (acquired): Secondary | ICD-10-CM

## 2022-08-30 DIAGNOSIS — E039 Hypothyroidism, unspecified: Secondary | ICD-10-CM

## 2022-08-30 DIAGNOSIS — K5790 Diverticulosis of intestine, part unspecified, without perforation or abscess without bleeding: Secondary | ICD-10-CM

## 2022-08-30 DIAGNOSIS — R911 Solitary pulmonary nodule: Secondary | ICD-10-CM

## 2022-08-30 DIAGNOSIS — E1165 Type 2 diabetes mellitus with hyperglycemia: Secondary | ICD-10-CM

## 2022-08-30 DIAGNOSIS — R29898 Other symptoms and signs involving the musculoskeletal system: Secondary | ICD-10-CM

## 2022-08-30 DIAGNOSIS — R32 Unspecified urinary incontinence: Secondary | ICD-10-CM

## 2022-08-30 DIAGNOSIS — E7849 Other hyperlipidemia: Secondary | ICD-10-CM

## 2022-08-30 DIAGNOSIS — J40 Bronchitis, not specified as acute or chronic: Secondary | ICD-10-CM

## 2022-08-30 DIAGNOSIS — R54 Age-related physical debility: Secondary | ICD-10-CM

## 2022-08-30 DIAGNOSIS — G629 Polyneuropathy, unspecified: Secondary | ICD-10-CM

## 2022-08-30 DIAGNOSIS — G479 Sleep disorder, unspecified: Secondary | ICD-10-CM

## 2022-08-30 DIAGNOSIS — E785 Hyperlipidemia, unspecified: Secondary | ICD-10-CM

## 2022-08-30 DIAGNOSIS — F445 Conversion disorder with seizures or convulsions: Secondary | ICD-10-CM

## 2022-08-30 DIAGNOSIS — G4733 Obstructive sleep apnea (adult) (pediatric): Secondary | ICD-10-CM

## 2022-08-30 DIAGNOSIS — U071 COVID-19 virus infection: Secondary | ICD-10-CM

## 2022-08-30 DIAGNOSIS — K219 Gastro-esophageal reflux disease without esophagitis: Secondary | ICD-10-CM

## 2022-08-30 DIAGNOSIS — R3989 Other symptoms and signs involving the genitourinary system: Secondary | ICD-10-CM

## 2022-08-30 DIAGNOSIS — M4802 Spinal stenosis, cervical region: Secondary | ICD-10-CM

## 2022-08-30 DIAGNOSIS — E119 Type 2 diabetes mellitus without complications: Secondary | ICD-10-CM

## 2022-08-30 DIAGNOSIS — K760 Fatty (change of) liver, not elsewhere classified: Secondary | ICD-10-CM

## 2022-08-30 DIAGNOSIS — Z79899 Other long term (current) drug therapy: Secondary | ICD-10-CM

## 2022-08-30 DIAGNOSIS — R413 Other amnesia: Secondary | ICD-10-CM

## 2022-08-30 LAB — CBC AND DIFF
ABSOLUTE BASO COUNT: 0 K/UL (ref 0–0.20)
ABSOLUTE EOS COUNT: 0.2 K/UL (ref 0–0.45)
ABSOLUTE MONO COUNT: 0.8 K/UL — ABNORMAL HIGH (ref 0–0.80)
HEMOGLOBIN: 12 g/dL (ref 12.0–15.0)
MDW (MONOCYTE DISTRIBUTION WIDTH): 17 (ref <20.7)
WBC COUNT: 8 K/UL (ref 4.5–11.0)

## 2022-08-30 LAB — COMPREHENSIVE METABOLIC PANEL
ALBUMIN: 3.8 g/dL (ref 3.5–5.0)
ALK PHOSPHATASE: 121 U/L — ABNORMAL HIGH (ref 25–110)
ALT: 22 U/L (ref 7–56)
ANION GAP: 11 K/UL (ref 3–12)
AST: 24 U/L (ref 7–40)
BLD UREA NITROGEN: 24 mg/dL (ref 7–25)
CHLORIDE: 101 MMOL/L (ref 98–110)
CO2: 24 MMOL/L (ref 21–30)
CREATININE: 0.7 mg/dL (ref 0.4–1.00)
EGFR: >60 mL/min (ref >60)
GLUCOSE,PANEL: 205 mg/dL — ABNORMAL HIGH (ref 70–100)
SODIUM: 136 MMOL/L — ABNORMAL LOW (ref 137–147)
TOTAL BILIRUBIN: 0.5 mg/dL (ref 0.3–1.2)

## 2022-08-30 LAB — C REACTIVE PROTEIN (CRP): C-REACTIVE PROTEIN: 2.3 mg/dL — ABNORMAL HIGH (ref <1.0)

## 2022-08-30 LAB — SED RATE: ESR: 41 mm/h — ABNORMAL HIGH (ref 0–30)

## 2022-08-30 MED ORDER — KETOROLAC 15 MG/ML IJ SOLN
15 mg | Freq: Once | INTRAVENOUS | 0 refills | Status: AC
Start: 2022-08-30 — End: ?
  Administered 2022-08-31: 05:00:00 15 mg via INTRAVENOUS

## 2022-08-31 ENCOUNTER — Inpatient Hospital Stay: Admit: 2022-08-31 | Discharge: 2022-08-31 | Payer: MEDICARE

## 2022-08-31 ENCOUNTER — Emergency Department: Admit: 2022-08-31 | Discharge: 2022-08-30 | Payer: MEDICARE

## 2022-08-31 ENCOUNTER — Emergency Department: Admit: 2022-08-31 | Discharge: 2022-08-31 | Payer: MEDICARE

## 2022-08-31 ENCOUNTER — Encounter: Admit: 2022-08-31 | Discharge: 2022-08-31 | Payer: MEDICARE

## 2022-08-31 MED ADMIN — PRAMIPEXOLE 0.25 MG PO TAB [81284]: 0.75 mg | ORAL | @ 14:00:00 | NDC 00904670461

## 2022-08-31 MED ADMIN — CALCIUM CARBONATE 500 MG CALCIUM (1,250 MG) PO TAB [1300]: 1250 mg | ORAL | @ 14:00:00 | NDC 80681005800

## 2022-08-31 MED ADMIN — PREDNISONE 20 MG PO TAB [6496]: 30 mg | ORAL | @ 22:00:00 | Stop: 2022-08-31 | NDC 00054001820

## 2022-08-31 MED ADMIN — CELECOXIB 200 MG PO CAP [76958]: 200 mg | ORAL | @ 14:00:00 | NDC 00904650361

## 2022-08-31 MED ADMIN — HYDROCODONE-ACETAMINOPHEN 5-325 MG PO TAB [34505]: 1 | ORAL | @ 10:00:00 | NDC 00904682461

## 2022-08-31 MED ADMIN — INSULIN ASPART 100 UNIT/ML SC FLEXPEN [87504]: 30 [IU] | SUBCUTANEOUS | @ 14:00:00 | NDC 00169633910

## 2022-08-31 MED ADMIN — NALOXONE 1 MG/ML IJ SYRG [5374]: 2 mg | INTRAVENOUS | @ 09:00:00 | Stop: 2022-08-31 | NDC 55150034501

## 2022-08-31 MED ADMIN — VENLAFAXINE 150 MG PO CP24 [78649]: 150 mg | ORAL | @ 14:00:00 | NDC 68084071311

## 2022-08-31 MED ADMIN — FUROSEMIDE 40 MG PO TAB [3295]: 40 mg | ORAL | @ 14:00:00 | NDC 51079007301

## 2022-08-31 MED ADMIN — HEPARIN, PORCINE (PF) 5,000 UNIT/0.5 ML IJ SYRG [95535]: 5000 [IU] | SUBCUTANEOUS | @ 19:00:00 | NDC 63323054303

## 2022-08-31 MED ADMIN — NALOXONE 0.4 MG/ML IJ SOLN [5373]: 0.4 mg | INTRAVENOUS | @ 09:00:00 | Stop: 2022-08-31 | NDC 70069007101

## 2022-08-31 MED ADMIN — DOCUSATE SODIUM 100 MG PO CAP [2566]: 200 mg | ORAL | @ 14:00:00 | NDC 00904728080

## 2022-08-31 MED ADMIN — FOLIC ACID 1 MG PO TAB [3233]: 4 mg | ORAL | @ 14:00:00 | NDC 00904722461

## 2022-08-31 MED ADMIN — MORPHINE 4 MG/ML IV SYRG [320654]: 4 mg | INTRAVENOUS | @ 08:00:00 | Stop: 2022-08-31 | NDC 00409189103

## 2022-08-31 MED ADMIN — LORATADINE 10 MG PO TAB [10466]: 10 mg | ORAL | @ 14:00:00 | NDC 00904685261

## 2022-08-31 MED ADMIN — PREDNISONE 10 MG PO TAB [6494]: 30 mg | ORAL | @ 22:00:00 | Stop: 2022-08-31 | NDC 00054001720

## 2022-08-31 MED ADMIN — PRAMIPEXOLE 0.25 MG PO TAB [81284]: 0.75 mg | ORAL | @ 19:00:00 | NDC 00904670461

## 2022-08-31 MED ADMIN — HEPARIN, PORCINE (PF) 5,000 UNIT/0.5 ML IJ SYRG [95535]: 5000 [IU] | SUBCUTANEOUS | @ 10:00:00 | NDC 00409131611

## 2022-08-31 MED ADMIN — LEVOTHYROXINE 88 MCG PO TAB [10403]: 88 ug | ORAL | @ 11:00:00 | NDC 00904695261

## 2022-08-31 MED ADMIN — FERROUS SULFATE 325 MG (65 MG IRON) PO TAB [3074]: 325 mg | ORAL | @ 14:00:00 | NDC 00904759161

## 2022-08-31 MED ADMIN — PANTOPRAZOLE 40 MG PO TBEC [80436]: 40 mg | ORAL | @ 14:00:00 | NDC 00904647461

## 2022-08-31 MED ADMIN — MIRABEGRON 50 MG PO TB24 [313678]: 50 mg | ORAL | @ 14:00:00 | NDC 00469260290

## 2022-08-31 MED ADMIN — CYCLOBENZAPRINE 10 MG PO TAB [2017]: 5 mg | ORAL | @ 10:00:00 | NDC 72888001400

## 2022-08-31 MED ADMIN — SPIRONOLACTONE 25 MG PO TAB [7437]: 25 mg | ORAL | @ 14:00:00 | NDC 00904692761

## 2022-08-31 NOTE — Telephone Encounter
Order for supplies placed as discussed per office note on 08/21/22   DME: [facility]  Will continue to follow up.    The patient's PAP compliance f/u office visit notes from 08/21/22 were faxed to their DME, [facility].

## 2022-08-31 NOTE — Telephone Encounter
-----   Message from Franchot Gallo, RN sent at 08/21/2022  4:32 PM CDT -----  [id number]

## 2022-08-31 NOTE — ED Notes
Pt is a 71 y.o. female who came into the ED today with c/o joint pain. Patient reports this began yesterday. Endorses having rheumatoid arthritis flare up in bilateral knees. R. Knee is warm to touch and swollen. Bilateral hands and feet are visibly swollen and tender to touch. Endorses N/V x2 last night. Pt denies CP, SOB, constipation, abdominal pain, and/or fever/chills. Pt is alert and oriented, breathing is non-labored, skin is appropriate to age and ethnicity. Pt resting in bed, locked in lowest position, side rails up, call light in reach.    Belongings:  Shirt, pants, shoes

## 2022-08-31 NOTE — ED Triage Notes
ED Initial Provider Note:    This patient was seen in the ED triage area to initiate and expedite the patients ED care when possible.    ED Chief Complaint:   No chief complaint on file.      S: Hailey Rodgers is a 71 y.o. female who presents to the Emergency Department for rheumatoid arthritis flare in her knees, wrists, hands for the past few days.  She also reports back spasms that are bothersome over the last couple months.      PMHx:  Medical History:   Diagnosis Date   ? Age-related physical debility 10/06/2020   ? Allergic rhinitis    ? Arthritis    ? Asthma    ? Bronchitis    ? Cervical stenosis of spinal canal    ? COVID-19 virus infection 10/06/2020    06/2020   ? Disorganized thinking    ? Diverticulosis 10/06/2020   ? DM (diabetes mellitus) (HCC)    ? Dyslipidemia    ? Fatty liver disease, nonalcoholic    ? Gastroesophageal reflux disease without esophagitis 10/06/2020   ? GERD (gastroesophageal reflux disease)    ? Hypothyroidism    ? 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 Leg weakness    ? Lumbar stenosis    ? Lung nodule 10/06/2020    10/06/2020 5. ?Right upper lobe groundglass nodule measuring 5 mm which may be  infectious or inflammatory. Multiple additional sub-5 mm nodules  bilaterally, most lymph nodes or granulomas. Follow-up CT in 12 months  recommended for further evaluation if clinically indicated.    ? Memory loss    ? Narcolepsy    ? Obesity    ? OSA (obstructive sleep apnea)    ? Other hyperlipidemia 10/06/2020   ? Peripheral neuropathy    ? Polypharmacy 01/04/2021   ? Pseudoseizures    ? 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 Rheumatoid arthritis (HCC) 10/06/2020   ? Sleep disorder     Sleep Apnea & Narcolepsy   ? Type 2 diabetes mellitus with hyperglycemia, with long-term current use of insulin (HCC) 10/06/2020   ? Urinary incontinence    ? Urinary problem     urinary frequency   ? Vision decreased     diplopia LMP  (LMP Unknown)    O: Brief Physical: R knee very tender to touch, pain with minimal ROM, borderline tachycardic, notable spasm to R paraspinous muscles    A/P: The patient was seen by me as an initial provider in triage. A brief history and physical was obtained. My exam is intended to be an initial medial screening exam. Initial orders have been placed by me. My working diagnosis is back spasms, RA flare, arthritis.    The patient is deemed appropriate for the main ED. The patient's care will be resumed by the ED provider care team once the patient is roomed in the ED. A more detailed / complete H&P will be documented by those providers.

## 2022-09-01 ENCOUNTER — Encounter: Admit: 2022-09-01 | Discharge: 2022-09-01 | Payer: MEDICARE

## 2022-09-01 MED ADMIN — PRAMIPEXOLE 0.25 MG PO TAB [81284]: 0.75 mg | ORAL | @ 03:00:00 | NDC 00904670461

## 2022-09-01 MED ADMIN — PRAMIPEXOLE 0.25 MG PO TAB [81284]: 0.75 mg | ORAL | @ 20:00:00 | NDC 00904670461

## 2022-09-01 MED ADMIN — PRAMIPEXOLE 0.25 MG PO TAB [81284]: 0.75 mg | ORAL | @ 14:00:00 | NDC 00904670461

## 2022-09-01 MED ADMIN — DOCUSATE SODIUM 100 MG PO CAP [2566]: 200 mg | ORAL | @ 14:00:00 | NDC 00904728080

## 2022-09-01 MED ADMIN — HEPARIN, PORCINE (PF) 5,000 UNIT/0.5 ML IJ SYRG [95535]: 5000 [IU] | SUBCUTANEOUS | @ 19:00:00 | NDC 00409131611

## 2022-09-01 MED ADMIN — PANTOPRAZOLE 40 MG PO TBEC [80436]: 40 mg | ORAL | @ 14:00:00 | NDC 00904647461

## 2022-09-01 MED ADMIN — CYCLOBENZAPRINE 10 MG PO TAB [2017]: 5 mg | ORAL | @ 04:00:00 | NDC 72888001400

## 2022-09-01 MED ADMIN — FERROUS SULFATE 325 MG (65 MG IRON) PO TAB [3074]: 325 mg | ORAL | @ 14:00:00 | NDC 00904759161

## 2022-09-01 MED ADMIN — PANTOPRAZOLE 40 MG PO TBEC [80436]: 40 mg | ORAL | @ 03:00:00 | NDC 00904647461

## 2022-09-01 MED ADMIN — SPIRONOLACTONE 25 MG PO TAB [7437]: 25 mg | ORAL | @ 14:00:00 | NDC 00904692761

## 2022-09-01 MED ADMIN — VENLAFAXINE 150 MG PO CP24 [78649]: 150 mg | ORAL | @ 14:00:00 | NDC 68084071311

## 2022-09-01 MED ADMIN — FUROSEMIDE 40 MG PO TAB [3295]: 40 mg | ORAL | @ 14:00:00 | NDC 51079007301

## 2022-09-01 MED ADMIN — CALCIUM CARBONATE 500 MG CALCIUM (1,250 MG) PO TAB [1300]: 1250 mg | ORAL | @ 03:00:00 | NDC 80681005800

## 2022-09-01 MED ADMIN — HEPARIN, PORCINE (PF) 5,000 UNIT/0.5 ML IJ SYRG [95535]: 5000 [IU] | SUBCUTANEOUS | @ 03:00:00 | NDC 00409131611

## 2022-09-01 MED ADMIN — DOCUSATE SODIUM 100 MG PO CAP [2566]: 200 mg | ORAL | @ 03:00:00 | NDC 00904718361

## 2022-09-01 MED ADMIN — FOLIC ACID 1 MG PO TAB [3233]: 4 mg | ORAL | @ 14:00:00 | NDC 00904722461

## 2022-09-01 MED ADMIN — ASPIRIN 81 MG PO CHEW [680]: 81 mg | ORAL | @ 03:00:00 | NDC 66553000201

## 2022-09-01 MED ADMIN — HEPARIN, PORCINE (PF) 5,000 UNIT/0.5 ML IJ SYRG [95535]: 5000 [IU] | SUBCUTANEOUS | @ 11:00:00 | NDC 00409131611

## 2022-09-01 MED ADMIN — NORTRIPTYLINE 25 MG PO CAP [5675]: 25 mg | ORAL | @ 03:00:00 | NDC 60687029311

## 2022-09-01 MED ADMIN — MIRABEGRON 50 MG PO TB24 [313678]: 50 mg | ORAL | @ 14:00:00 | NDC 00469260290

## 2022-09-01 MED ADMIN — LORATADINE 10 MG PO TAB [10466]: 10 mg | ORAL | @ 15:00:00 | NDC 00904685261

## 2022-09-01 MED ADMIN — CELECOXIB 200 MG PO CAP [76958]: 200 mg | ORAL | @ 03:00:00 | NDC 00904650361

## 2022-09-01 MED ADMIN — LEVOTHYROXINE 88 MCG PO TAB [10403]: 88 ug | ORAL | @ 11:00:00 | NDC 00904695261

## 2022-09-01 MED ADMIN — PREDNISONE 20 MG PO TAB [6496]: 20 mg | ORAL | @ 14:00:00 | Stop: 2022-09-06 | NDC 00054001820

## 2022-09-01 MED ADMIN — CELECOXIB 200 MG PO CAP [76958]: 200 mg | ORAL | @ 14:00:00 | NDC 00904650361

## 2022-09-01 MED ADMIN — ACETAMINOPHEN 325 MG PO TAB [101]: 650 mg | ORAL | @ 14:00:00 | NDC 00904677361

## 2022-09-01 MED ADMIN — FERROUS SULFATE 325 MG (65 MG IRON) PO TAB [3074]: 325 mg | ORAL | @ 03:00:00 | NDC 00904759161

## 2022-09-01 MED ADMIN — FAMOTIDINE 20 MG PO TAB [10011]: 40 mg | ORAL | @ 03:00:00 | NDC 63739064510

## 2022-09-01 MED ADMIN — CALCIUM CARBONATE 500 MG CALCIUM (1,250 MG) PO TAB [1300]: 1250 mg | ORAL | @ 14:00:00 | NDC 80681005800

## 2022-09-02 ENCOUNTER — Encounter: Admit: 2022-09-02 | Discharge: 2022-09-02 | Payer: MEDICARE

## 2022-09-02 MED ORDER — TRULICITY 1.5 MG/0.5 ML SC PNIJ
1.5 mg | SUBCUTANEOUS | 3 refills | Status: DC
Start: 2022-09-02 — End: 2022-09-02

## 2022-09-02 MED ORDER — GEMTESA 75 MG PO TAB
75 mg | ORAL_TABLET | Freq: Every evening | ORAL | 3 refills | Status: AC
Start: 2022-09-02 — End: ?
  Filled 2022-09-02: qty 90, 90d supply, fill #1

## 2022-09-02 MED ORDER — MYRBETRIQ 50 MG PO TB24
50 mg | ORAL_TABLET | Freq: Every day | ORAL | 3 refills | Status: AC
Start: 2022-09-02 — End: ?
  Filled 2022-09-02: qty 90, 90d supply, fill #1

## 2022-09-02 MED ADMIN — HEPARIN, PORCINE (PF) 5,000 UNIT/0.5 ML IJ SYRG [95535]: 5000 [IU] | SUBCUTANEOUS | @ 02:00:00 | NDC 63323054303

## 2022-09-02 MED ADMIN — HEPARIN, PORCINE (PF) 5,000 UNIT/0.5 ML IJ SYRG [95535]: 5000 [IU] | SUBCUTANEOUS | @ 11:00:00 | Stop: 2022-09-02 | NDC 00409131611

## 2022-09-02 MED ADMIN — GUAIFENESIN 600 MG PO TA12 [321343]: 600 mg | ORAL | @ 09:00:00 | Stop: 2022-09-02 | NDC 68084057211

## 2022-09-02 MED ADMIN — CALCIUM CARBONATE 500 MG CALCIUM (1,250 MG) PO TAB [1300]: 1250 mg | ORAL | @ 03:00:00 | NDC 80681005800

## 2022-09-02 MED ADMIN — CALCIUM CARBONATE 500 MG CALCIUM (1,250 MG) PO TAB [1300]: 1250 mg | ORAL | @ 14:00:00 | Stop: 2022-09-02 | NDC 00904188361

## 2022-09-02 MED ADMIN — ASPIRIN 81 MG PO CHEW [680]: 81 mg | ORAL | @ 02:00:00 | NDC 66553000201

## 2022-09-02 MED ADMIN — PRAMIPEXOLE 0.25 MG PO TAB [81284]: 0.75 mg | ORAL | @ 14:00:00 | Stop: 2022-09-02 | NDC 00904670461

## 2022-09-02 MED ADMIN — DOCUSATE SODIUM 100 MG PO CAP [2566]: 200 mg | ORAL | @ 02:00:00 | NDC 00904718361

## 2022-09-02 MED ADMIN — FAMOTIDINE 20 MG PO TAB [10011]: 40 mg | ORAL | @ 02:00:00 | NDC 00904719361

## 2022-09-02 MED ADMIN — LORATADINE 10 MG PO TAB [10466]: 10 mg | ORAL | @ 14:00:00 | Stop: 2022-09-02 | NDC 00904685261

## 2022-09-02 MED ADMIN — SPIRONOLACTONE 25 MG PO TAB [7437]: 25 mg | ORAL | @ 14:00:00 | Stop: 2022-09-02 | NDC 00904692761

## 2022-09-02 MED ADMIN — NORTRIPTYLINE 25 MG PO CAP [5675]: 25 mg | ORAL | @ 02:00:00 | NDC 60687029311

## 2022-09-02 MED ADMIN — MIRABEGRON 50 MG PO TB24 [313678]: 50 mg | ORAL | @ 14:00:00 | Stop: 2022-09-02 | NDC 00469260290

## 2022-09-02 MED ADMIN — LEVOTHYROXINE 88 MCG PO TAB [10403]: 88 ug | ORAL | @ 11:00:00 | Stop: 2022-09-02 | NDC 00904695261

## 2022-09-02 MED ADMIN — CELECOXIB 200 MG PO CAP [76958]: 200 mg | ORAL | @ 02:00:00 | NDC 00904650361

## 2022-09-02 MED ADMIN — DOCUSATE SODIUM 100 MG PO CAP [2566]: 200 mg | ORAL | @ 14:00:00 | Stop: 2022-09-02 | NDC 00904728080

## 2022-09-02 MED ADMIN — VENLAFAXINE 150 MG PO CP24 [78649]: 150 mg | ORAL | @ 14:00:00 | Stop: 2022-09-02 | NDC 68084071311

## 2022-09-02 MED ADMIN — PREDNISONE 20 MG PO TAB [6496]: 20 mg | ORAL | @ 14:00:00 | Stop: 2022-09-02 | NDC 00054001820

## 2022-09-02 MED ADMIN — PANTOPRAZOLE 40 MG PO TBEC [80436]: 40 mg | ORAL | @ 14:00:00 | Stop: 2022-09-02 | NDC 00904647461

## 2022-09-02 MED ADMIN — FUROSEMIDE 40 MG PO TAB [3295]: 40 mg | ORAL | @ 14:00:00 | Stop: 2022-09-02 | NDC 51079007301

## 2022-09-02 MED ADMIN — CELECOXIB 200 MG PO CAP [76958]: 200 mg | ORAL | @ 14:00:00 | Stop: 2022-09-02 | NDC 00904650361

## 2022-09-02 MED ADMIN — PANTOPRAZOLE 40 MG PO TBEC [80436]: 40 mg | ORAL | @ 02:00:00 | NDC 00904647461

## 2022-09-02 MED ADMIN — FOLIC ACID 1 MG PO TAB [3233]: 4 mg | ORAL | @ 14:00:00 | Stop: 2022-09-02 | NDC 00904722461

## 2022-09-02 MED ADMIN — PRAMIPEXOLE 0.25 MG PO TAB [81284]: 0.75 mg | ORAL | @ 03:00:00 | NDC 00904670461

## 2022-09-02 MED ADMIN — FERROUS SULFATE 325 MG (65 MG IRON) PO TAB [3074]: 325 mg | ORAL | @ 14:00:00 | Stop: 2022-09-02 | NDC 00904759161

## 2022-09-02 MED FILL — PREDNISONE 5 MG PO TAB: 5 mg | ORAL | 18 days supply | Qty: 42 | Fill #1 | Status: CP

## 2022-09-02 MED FILL — HYDROCODONE-ACETAMINOPHEN 5-325 MG PO TAB: 5/325 mg | ORAL | 2 days supply | Qty: 10 | Fill #1 | Status: CP

## 2022-09-04 ENCOUNTER — Encounter: Admit: 2022-09-04 | Discharge: 2022-09-04 | Payer: MEDICARE

## 2022-09-05 ENCOUNTER — Encounter: Admit: 2022-09-05 | Discharge: 2022-09-05 | Payer: MEDICARE

## 2022-09-05 ENCOUNTER — Ambulatory Visit: Admit: 2022-09-05 | Discharge: 2022-09-05 | Payer: MEDICARE

## 2022-09-05 DIAGNOSIS — G259 Extrapyramidal and movement disorder, unspecified: Secondary | ICD-10-CM

## 2022-09-05 DIAGNOSIS — K760 Fatty (change of) liver, not elsewhere classified: Secondary | ICD-10-CM

## 2022-09-05 DIAGNOSIS — R413 Other amnesia: Secondary | ICD-10-CM

## 2022-09-05 DIAGNOSIS — G629 Polyneuropathy, unspecified: Secondary | ICD-10-CM

## 2022-09-05 DIAGNOSIS — Z79899 Other long term (current) drug therapy: Secondary | ICD-10-CM

## 2022-09-05 DIAGNOSIS — H547 Unspecified visual loss: Secondary | ICD-10-CM

## 2022-09-05 DIAGNOSIS — G4733 Obstructive sleep apnea (adult) (pediatric): Secondary | ICD-10-CM

## 2022-09-05 DIAGNOSIS — E785 Hyperlipidemia, unspecified: Secondary | ICD-10-CM

## 2022-09-05 DIAGNOSIS — R911 Solitary pulmonary nodule: Secondary | ICD-10-CM

## 2022-09-05 DIAGNOSIS — E034 Atrophy of thyroid (acquired): Secondary | ICD-10-CM

## 2022-09-05 DIAGNOSIS — I2510 Atherosclerotic heart disease of native coronary artery without angina pectoris: Secondary | ICD-10-CM

## 2022-09-05 DIAGNOSIS — C801 Malignant (primary) neoplasm, unspecified: Secondary | ICD-10-CM

## 2022-09-05 DIAGNOSIS — E1165 Type 2 diabetes mellitus with hyperglycemia: Secondary | ICD-10-CM

## 2022-09-05 DIAGNOSIS — G479 Sleep disorder, unspecified: Secondary | ICD-10-CM

## 2022-09-05 DIAGNOSIS — E039 Hypothyroidism, unspecified: Secondary | ICD-10-CM

## 2022-09-05 DIAGNOSIS — J40 Bronchitis, not specified as acute or chronic: Secondary | ICD-10-CM

## 2022-09-05 DIAGNOSIS — F3341 Major depressive disorder, recurrent, in partial remission: Secondary | ICD-10-CM

## 2022-09-05 DIAGNOSIS — R3989 Other symptoms and signs involving the genitourinary system: Secondary | ICD-10-CM

## 2022-09-05 DIAGNOSIS — M069 Rheumatoid arthritis, unspecified: Secondary | ICD-10-CM

## 2022-09-05 DIAGNOSIS — U071 COVID-19 virus infection: Secondary | ICD-10-CM

## 2022-09-05 DIAGNOSIS — E119 Type 2 diabetes mellitus without complications: Secondary | ICD-10-CM

## 2022-09-05 DIAGNOSIS — R29898 Other symptoms and signs involving the musculoskeletal system: Secondary | ICD-10-CM

## 2022-09-05 DIAGNOSIS — R4189 Other symptoms and signs involving cognitive functions and awareness: Secondary | ICD-10-CM

## 2022-09-05 DIAGNOSIS — K219 Gastro-esophageal reflux disease without esophagitis: Secondary | ICD-10-CM

## 2022-09-05 DIAGNOSIS — G47419 Narcolepsy without cataplexy: Secondary | ICD-10-CM

## 2022-09-05 DIAGNOSIS — J45909 Unspecified asthma, uncomplicated: Secondary | ICD-10-CM

## 2022-09-05 DIAGNOSIS — R55 Syncope and collapse: Secondary | ICD-10-CM

## 2022-09-05 DIAGNOSIS — E7849 Other hyperlipidemia: Secondary | ICD-10-CM

## 2022-09-05 DIAGNOSIS — M4802 Spinal stenosis, cervical region: Secondary | ICD-10-CM

## 2022-09-05 DIAGNOSIS — K5790 Diverticulosis of intestine, part unspecified, without perforation or abscess without bleeding: Secondary | ICD-10-CM

## 2022-09-05 DIAGNOSIS — Z9981 Dependence on supplemental oxygen: Secondary | ICD-10-CM

## 2022-09-05 DIAGNOSIS — R54 Age-related physical debility: Secondary | ICD-10-CM

## 2022-09-05 DIAGNOSIS — E669 Obesity, unspecified: Secondary | ICD-10-CM

## 2022-09-05 DIAGNOSIS — M48061 Spinal stenosis, lumbar region without neurogenic claudication: Secondary | ICD-10-CM

## 2022-09-05 DIAGNOSIS — R32 Unspecified urinary incontinence: Secondary | ICD-10-CM

## 2022-09-05 DIAGNOSIS — F445 Conversion disorder with seizures or convulsions: Secondary | ICD-10-CM

## 2022-09-05 DIAGNOSIS — J309 Allergic rhinitis, unspecified: Secondary | ICD-10-CM

## 2022-09-05 DIAGNOSIS — M1990 Unspecified osteoarthritis, unspecified site: Secondary | ICD-10-CM

## 2022-09-05 DIAGNOSIS — R1319 Other dysphagia: Secondary | ICD-10-CM

## 2022-09-05 NOTE — Patient Instructions
rheumatoid arthritis (started on Humira a few months ago), depression, asthma, cervical spondylosis and spinal canal stenosis, COVID-19 (09/2020), DM, hyperlipidemia, hypothyroidism, narcolepsy, OSA, obesity and non epileptic seizures, referred to our clinic due to visual hallucinations and worsening somnolence. Patient reports visual symptoms for the past 2 months. She saw a man with a horse, a piece of paper and also some shadows in the past. These symptoms may happen at anytime and she denies clear triggers, frequency has been once or twice daily, this has not changed lately. She denies auditory hallucinations or passing out activity. Patient denies headaches, weakness, numbness/tingling, slurred speech, dizziness or facial droop during episodes. 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 Patient does need help with ADLs, she is able to walk with the help of a walker/cane due to balance problems but reports recurrent falls lately. She has been more forgetful since she had COVID last 06/2020. She describes problems lately when trying to remember names of people and she forgets where she puts her belongings. Patient needs to have reminders in order to keep appointments and daughter helps with medications for the past months. Overall short term memory seems more affected. Patient is normally able to recognize family and close friends. Denies episodes of disorientation in the past, but family supervises her lately. Patient used to drive in the past but denies driving in the past years due to seizures. She takes Mirapex for years due to presumed RLS and also has presumed narcolepsy, but she never had sleep testing and she is not seen by a sleep doctor lately. Patient also takes oxybutynin for years due to bladder problems.        Review of records:  CT head (09/2020): No intracranial hemorrhage or calvarial fracture. Minimal supratentorial white matter hypodensities, better seen on MRI. These are nonspecific though likely due to chronic microvascular ischemic changes in a patient this age.   MRI brain (2019): No recent infarct, intracranial hemorrhage, or mass lesion. Unchanged supratentorial white matter FLAIR hyperintensities likely chronic microvascular ischemic changes. No findings to explain epileptic events.  EEG (2020): This is a normal 72 hour ambulatory video EEG study. The patient had 3 clinical events which were not epileptic in origin. No epileptic seizures were recorded.  CT cervical spine (09/2020): No acute fracture or new subluxation. Mild grade 1 anterolisthesis at C3-4 and retrolisthesis at C5-6 is stable. Redemonstration of prior posterior spinal fixation extending from C3 through C7.  Multilevel degenerative changes with areas of up to moderate to marked neural foraminal narrowing.  Labs (2021) with normal TSH.

## 2022-09-10 ENCOUNTER — Encounter: Admit: 2022-09-10 | Discharge: 2022-09-10 | Payer: MEDICARE

## 2022-09-11 ENCOUNTER — Encounter: Admit: 2022-09-11 | Discharge: 2022-09-11 | Payer: MEDICARE

## 2022-09-11 MED ORDER — ROSUVASTATIN 20 MG PO TAB
20 mg | ORAL_TABLET | Freq: Every day | ORAL | 3 refills | 90.00000 days | Status: AC
Start: 2022-09-11 — End: ?
  Filled 2022-09-16: qty 90, 90d supply, fill #1

## 2022-09-11 NOTE — Telephone Encounter
-----   Message from Michiel Cowboy, MD sent at 09/09/2022  6:17 AM CDT -----  Regarding: RE: lipids  Yes, with f/up lipid profile in about 3 months, thanks.  ----- Message -----  From: Asencion Noble  Sent: 09/05/2022   8:48 AM CDT  To: Michiel Cowboy, MD  Subject: lipids                                           Chol 205, trig 168, hdl 47, ldl 127.  Do you want to restart her crestor 20?    Thanks

## 2022-09-11 NOTE — Telephone Encounter
Called to dtr.  Flag sent forward to ck lipids in 3 mo.

## 2022-09-12 ENCOUNTER — Encounter: Admit: 2022-09-12 | Discharge: 2022-09-12 | Payer: MEDICARE

## 2022-09-12 MED ORDER — GEMTESA 75 MG PO TAB
75 mg | ORAL_TABLET | Freq: Every evening | ORAL | 3 refills | Status: CN
Start: 2022-09-12 — End: ?

## 2022-09-13 ENCOUNTER — Encounter: Admit: 2022-09-13 | Discharge: 2022-09-13 | Payer: MEDICARE

## 2022-09-15 ENCOUNTER — Encounter: Admit: 2022-09-15 | Discharge: 2022-09-15 | Payer: MEDICARE

## 2022-09-16 ENCOUNTER — Ambulatory Visit: Admit: 2022-09-16 | Discharge: 2022-09-17 | Payer: MEDICARE

## 2022-09-16 ENCOUNTER — Encounter: Admit: 2022-09-16 | Discharge: 2022-09-16 | Payer: MEDICARE

## 2022-09-16 DIAGNOSIS — M0579 Rheumatoid arthritis with rheumatoid factor of multiple sites without organ or systems involvement: Secondary | ICD-10-CM

## 2022-09-16 MED ORDER — ABATACEPT IVPB
750 mg | Freq: Once | INTRAVENOUS | 0 refills | Status: CP
Start: 2022-09-16 — End: ?
  Administered 2022-09-16 (×2): 750 mg via INTRAVENOUS

## 2022-09-16 MED ORDER — ABATACEPT IVPB
750 mg | Freq: Once | INTRAVENOUS | 0 refills
Start: 2022-09-16 — End: ?

## 2022-09-16 MED FILL — FOLIC ACID 1 MG PO TAB: 1 mg | ORAL | 30 days supply | Qty: 120 | Fill #2 | Status: CP

## 2022-09-16 MED FILL — INSULIN ASPART 100 UNIT/ML SC FLEXPEN: 100 unit/mL (3 mL) | SUBCUTANEOUS | 26 days supply | Qty: 30 | Fill #3 | Status: CP

## 2022-09-16 MED FILL — SPIRONOLACTONE 25 MG PO TAB: 25 mg | ORAL | 90 days supply | Qty: 90 | Fill #2 | Status: CP

## 2022-09-16 MED FILL — FUROSEMIDE 40 MG PO TAB: 40 mg | ORAL | 90 days supply | Qty: 90 | Fill #1 | Status: CP

## 2022-09-17 DIAGNOSIS — Z79899 Other long term (current) drug therapy: Secondary | ICD-10-CM

## 2022-09-26 ENCOUNTER — Encounter: Admit: 2022-09-26 | Discharge: 2022-09-26 | Payer: MEDICARE

## 2022-09-26 MED ORDER — LEVOTHYROXINE 88 MCG PO TAB
88 ug | ORAL_TABLET | Freq: Every day | ORAL | 1 refills | 30.00000 days | Status: AC
Start: 2022-09-26 — End: ?
  Filled 2022-10-04: qty 90, 90d supply, fill #1

## 2022-09-27 ENCOUNTER — Encounter: Admit: 2022-09-27 | Discharge: 2022-09-27 | Payer: MEDICARE

## 2022-09-27 MED FILL — PRAMIPEXOLE 0.75 MG PO TAB: 0.75 mg | ORAL | 90 days supply | Qty: 270 | Fill #2 | Status: AC

## 2022-10-04 ENCOUNTER — Encounter: Admit: 2022-10-04 | Discharge: 2022-10-04 | Payer: MEDICARE

## 2022-10-10 ENCOUNTER — Encounter: Admit: 2022-10-10 | Discharge: 2022-10-10 | Payer: MEDICARE

## 2022-10-15 ENCOUNTER — Encounter: Admit: 2022-10-15 | Discharge: 2022-10-15 | Payer: MEDICARE

## 2022-10-15 ENCOUNTER — Ambulatory Visit: Admit: 2022-10-15 | Discharge: 2022-10-16 | Payer: MEDICARE

## 2022-10-15 VITALS — BP 133/68 | HR 97

## 2022-10-15 VITALS — BP 100/63 | HR 98 | Temp 98.00000°F | Wt 213.0 lb

## 2022-10-15 DIAGNOSIS — M0579 Rheumatoid arthritis with rheumatoid factor of multiple sites without organ or systems involvement: Principal | ICD-10-CM

## 2022-10-15 MED ORDER — ABATACEPT IVPB
750 mg | Freq: Once | INTRAVENOUS | 0 refills
Start: 2022-10-15 — End: ?

## 2022-10-15 MED ORDER — ABATACEPT IVPB
750 mg | Freq: Once | INTRAVENOUS | 0 refills | Status: CP
Start: 2022-10-15 — End: ?
  Administered 2022-10-15 (×2): 750 mg via INTRAVENOUS

## 2022-10-15 NOTE — Progress Notes
1720:  Patient tolerated infusion without difficulty.  No signs of reaction noted.

## 2022-10-16 DIAGNOSIS — Z79899 Other long term (current) drug therapy: Secondary | ICD-10-CM

## 2022-10-17 ENCOUNTER — Encounter: Admit: 2022-10-17 | Discharge: 2022-10-17 | Payer: MEDICARE

## 2022-10-18 ENCOUNTER — Encounter: Admit: 2022-10-18 | Discharge: 2022-10-18 | Payer: MEDICARE

## 2022-10-18 MED FILL — PANTOPRAZOLE 40 MG PO TBEC: 40 mg | ORAL | 90 days supply | Qty: 180 | Fill #2 | Status: AC

## 2022-10-18 MED FILL — FOLIC ACID 1 MG PO TAB: 1 mg | ORAL | 30 days supply | Qty: 120 | Fill #3 | Status: AC

## 2022-10-18 MED FILL — VENLAFAXINE 150 MG PO CP24: 150 mg | ORAL | 90 days supply | Qty: 90 | Fill #3 | Status: AC

## 2022-10-18 MED FILL — CHOLECALCIFEROL (VITAMIN D3) 1,250 MCG (50,000 UNIT) PO CAP: 50000 units | ORAL | 84 days supply | Qty: 12 | Fill #2 | Status: AC

## 2022-10-23 ENCOUNTER — Encounter: Admit: 2022-10-23 | Discharge: 2022-10-23 | Payer: MEDICARE

## 2022-10-24 ENCOUNTER — Encounter: Admit: 2022-10-24 | Discharge: 2022-10-24 | Payer: MEDICARE

## 2022-10-25 ENCOUNTER — Encounter: Admit: 2022-10-25 | Discharge: 2022-10-25 | Payer: MEDICARE

## 2022-10-26 MED FILL — PREDNISONE 5 MG PO TAB: 5 mg | ORAL | 28 days supply | Qty: 46 | Fill #1 | Status: AC

## 2022-10-27 MED FILL — INSULIN ASPART 100 UNIT/ML SC FLEXPEN: 100 unit/mL (3 mL) | SUBCUTANEOUS | 26 days supply | Qty: 30 | Fill #4 | Status: AC

## 2022-10-28 ENCOUNTER — Encounter: Admit: 2022-10-28 | Discharge: 2022-10-28 | Payer: MEDICARE

## 2022-10-28 ENCOUNTER — Ambulatory Visit: Admit: 2022-10-28 | Discharge: 2022-10-28 | Payer: MEDICARE

## 2022-10-28 DIAGNOSIS — H5052 Exophoria: Secondary | ICD-10-CM

## 2022-10-28 DIAGNOSIS — H532 Diplopia: Secondary | ICD-10-CM

## 2022-10-28 DIAGNOSIS — H5111 Convergence insufficiency: Secondary | ICD-10-CM

## 2022-10-28 DIAGNOSIS — H5213 Myopia, bilateral: Secondary | ICD-10-CM

## 2022-10-28 NOTE — Progress Notes
There is no height or weight on file to calculate BMI.             Assessment and Plan:  A/ 1. Exophoria    -with convergence insufficiency    -patient has prism in current glass, by measurement could add some prism to glasses to aid      2. Diplopia    -patient continues not occasional diplopia       3. Myopic astigmatism with presbyopia     P/ 1-2. Prism incorporated        3. Prescription for glasses provided to patient

## 2022-10-30 ENCOUNTER — Encounter: Admit: 2022-10-30 | Discharge: 2022-10-30 | Payer: MEDICARE

## 2022-11-01 ENCOUNTER — Encounter: Admit: 2022-11-01 | Discharge: 2022-11-01 | Payer: MEDICARE

## 2022-11-02 MED FILL — PREDNISONE 5 MG PO TAB: 5 mg | ORAL | 28 days supply | Qty: 46 | Fill #1 | Status: AC

## 2022-11-06 ENCOUNTER — Encounter: Admit: 2022-11-06 | Discharge: 2022-11-06 | Payer: MEDICARE

## 2022-11-11 ENCOUNTER — Encounter: Admit: 2022-11-11 | Discharge: 2022-11-11 | Payer: MEDICARE

## 2022-11-12 ENCOUNTER — Encounter: Admit: 2022-11-12 | Discharge: 2022-11-12 | Payer: MEDICARE

## 2022-11-12 ENCOUNTER — Ambulatory Visit: Admit: 2022-11-12 | Discharge: 2022-11-13 | Payer: MEDICARE

## 2022-11-12 MED ORDER — ABATACEPT IVPB
750 mg | Freq: Once | INTRAVENOUS | 0 refills | Status: CP
Start: 2022-11-12 — End: ?
  Administered 2022-11-12 (×2): 750 mg via INTRAVENOUS

## 2022-11-12 MED ORDER — ABATACEPT IVPB
750 mg | Freq: Once | INTRAVENOUS | 0 refills
Start: 2022-11-12 — End: ?

## 2022-11-12 NOTE — Progress Notes
1712-Pt tolerated rencia infusion without problems. Pt left infusion center in stable condition, with steady gait with cane, accompanied by daughter,

## 2022-11-13 DIAGNOSIS — Z79899 Other long term (current) drug therapy: Secondary | ICD-10-CM

## 2022-11-13 DIAGNOSIS — M0579 Rheumatoid arthritis with rheumatoid factor of multiple sites without organ or systems involvement: Secondary | ICD-10-CM

## 2022-11-14 ENCOUNTER — Encounter: Admit: 2022-11-14 | Discharge: 2022-11-14 | Payer: MEDICARE

## 2022-11-14 MED ORDER — METHOTREXATE SODIUM 2.5 MG PO TAB
20 mg | ORAL_TABLET | ORAL | 0 refills
Start: 2022-11-14 — End: ?

## 2022-11-15 ENCOUNTER — Encounter: Admit: 2022-11-15 | Discharge: 2022-11-15 | Payer: MEDICARE

## 2022-11-16 MED FILL — FOLIC ACID 1 MG PO TAB: 1 mg | ORAL | 30 days supply | Qty: 120 | Fill #4 | Status: CP

## 2022-11-19 ENCOUNTER — Encounter: Admit: 2022-11-19 | Discharge: 2022-11-19 | Payer: MEDICARE

## 2022-11-20 MED ORDER — PREDNISONE 5 MG PO TAB
ORAL_TABLET | 0 refills
Start: 2022-11-20 — End: ?

## 2022-11-20 MED FILL — METHOTREXATE SODIUM 2.5 MG PO TAB: 2.5 mg | ORAL | 84 days supply | Qty: 96 | Fill #1 | Status: AC

## 2022-11-27 ENCOUNTER — Encounter: Admit: 2022-11-27 | Discharge: 2022-11-27 | Payer: MEDICARE

## 2022-11-28 MED FILL — INSULIN ASPART 100 UNIT/ML SC FLEXPEN: 100 unit/mL (3 mL) | SUBCUTANEOUS | 26 days supply | Qty: 30 | Fill #5 | Status: AC

## 2022-11-28 MED FILL — FOLIC ACID 1 MG PO TAB: 1 mg | ORAL | 30 days supply | Qty: 120 | Fill #5 | Status: AC

## 2022-11-28 MED FILL — CYCLOBENZAPRINE 5 MG PO TAB: 5 mg | ORAL | 20 days supply | Qty: 60 | Fill #2 | Status: AC

## 2022-11-28 MED FILL — MYRBETRIQ 50 MG PO TB24: 50 mg | ORAL | 90 days supply | Qty: 90 | Fill #2 | Status: AC

## 2022-11-28 MED FILL — GEMTESA 75 MG PO TAB: 75 mg | ORAL | 30 days supply | Qty: 30 | Fill #1 | Status: AC

## 2022-11-28 NOTE — Telephone Encounter
I reviewed her 08/2022 labs.  Yes she is due for her methotrexate monitoring labs which we are doing every 12 weeks.    08/2022 hemoglobin 12.1 g per DL, platelets 247 K, WBC 6.6K with normal differential counts, normal serum creatinine, normal LFTs

## 2022-12-02 ENCOUNTER — Encounter: Admit: 2022-12-02 | Discharge: 2022-12-02 | Payer: MEDICARE

## 2022-12-05 ENCOUNTER — Encounter: Admit: 2022-12-05 | Discharge: 2022-12-05 | Payer: MEDICARE

## 2022-12-10 ENCOUNTER — Encounter: Admit: 2022-12-10 | Discharge: 2022-12-10 | Payer: MEDICARE

## 2022-12-10 ENCOUNTER — Ambulatory Visit: Admit: 2022-12-10 | Discharge: 2022-12-11 | Payer: MEDICARE

## 2022-12-10 DIAGNOSIS — Z79899 Other long term (current) drug therapy: Secondary | ICD-10-CM

## 2022-12-10 LAB — BASIC METABOLIC PANEL
ANION GAP: 7 (ref 3–12)
BLD UREA NITROGEN: 22 mg/dL (ref 7–25)
CALCIUM: 9.2 mg/dL (ref 8.5–10.6)
CHLORIDE: 102 MMOL/L (ref 98–110)
CO2: 27 MMOL/L (ref 21–30)
CREATININE: 0.7 mg/dL (ref 0.4–1.00)
EGFR: >60 mL/min (ref >60)
GLUCOSE,PANEL: 231 mg/dL — ABNORMAL HIGH (ref 70–100)
POTASSIUM: 3.6 MMOL/L (ref 3.5–5.1)
SODIUM: 136 MMOL/L — ABNORMAL LOW (ref 137–147)

## 2022-12-10 LAB — CBC AND DIFF
ABSOLUTE BASO COUNT: 0 K/UL (ref 0–0.20)
ABSOLUTE EOS COUNT: 0.2 K/UL (ref 0–0.45)
ABSOLUTE LYMPH COUNT: 2.1 K/UL (ref 1.0–4.8)
ABSOLUTE MONO COUNT: 0.5 K/UL (ref 0–0.80)
ABSOLUTE NEUTROPHIL: 2.4 K/UL (ref 1.8–7.0)
BASOPHILS %: 1 % (ref 0–2)
EOSINOPHILS %: 4 % (ref 0–5)
HEMATOCRIT: 35 % — ABNORMAL LOW (ref 36–45)
HEMOGLOBIN: 11 g/dL — ABNORMAL LOW (ref 12.0–15.0)
LYMPHOCYTES %: 39 % (ref 24–44)
MCH: 28 pg (ref 26–34)
MCHC: 33 g/dL (ref 32.0–36.0)
MCV: 85 FL (ref 80–100)
MONOCYTES %: 11 % (ref 4–12)
MPV: 8.7 FL (ref 7–11)
NEUTROPHILS %: 45 % (ref 41–77)
PLATELET COUNT: 213 K/UL (ref 150–400)
RBC COUNT: 4.1 M/UL (ref 4.0–5.0)
RDW: 14 % (ref 11–15)
WBC COUNT: 5.4 K/UL (ref 4.5–11.0)

## 2022-12-10 LAB — ALT (SGPT): ALT: 35 U/L (ref 7–56)

## 2022-12-10 MED ORDER — ABATACEPT IVPB
750 mg | Freq: Once | INTRAVENOUS | 0 refills | Status: CP
Start: 2022-12-10 — End: ?
  Administered 2022-12-10 (×2): 750 mg via INTRAVENOUS

## 2022-12-10 MED ORDER — ABATACEPT IVPB
750 mg | Freq: Once | INTRAVENOUS | 0 refills
Start: 2022-12-10 — End: ?

## 2022-12-10 NOTE — Progress Notes
Patient presents to infusion clinic for Orencia infusion.  Patient denies recent concern for infection, fever, or antibiotic use.  Pre-infusion vital signs and Medication education completed. Ordered labs drawn with peripheral IV start. and Peripheral IV started.       1559: Infusion started, see e-mar for administration details    1628: Infusion completed and flushed. See e-mar for administration details and Post infusion vitals completed. No concern for infusion reaction. Patient has no complaints    : PIV discontinued. Patient declined printed AVS. Prompted patient to schedule upcoming appointment(s) at infusion checkout or call infusion scheduling. Patient verbalized understanding. Patient ambulated off unit accompanied by caregiver. 1644    Vitals:    12/10/22 1528   BP: 134/70   BP Source: Arm, Right Upper   Pulse: 97   Temp: 36.6 C (97.8 F)   Resp: 16   SpO2: 95%   O2 Device: None (Room air)   TempSrc: Oral   Weight: 95.7 kg (211 lb)   Height: 170.2 cm ('5\' 7"'$ )

## 2022-12-11 ENCOUNTER — Encounter: Admit: 2022-12-11 | Discharge: 2022-12-11 | Payer: MEDICARE

## 2022-12-11 DIAGNOSIS — I10 Essential (primary) hypertension: Secondary | ICD-10-CM

## 2022-12-11 DIAGNOSIS — I5032 Chronic diastolic (congestive) heart failure: Secondary | ICD-10-CM

## 2022-12-11 DIAGNOSIS — I2510 Atherosclerotic heart disease of native coronary artery without angina pectoris: Secondary | ICD-10-CM

## 2022-12-11 DIAGNOSIS — E782 Mixed hyperlipidemia: Secondary | ICD-10-CM

## 2022-12-11 DIAGNOSIS — M0579 Rheumatoid arthritis with rheumatoid factor of multiple sites without organ or systems involvement: Secondary | ICD-10-CM

## 2022-12-11 MED FILL — ROSUVASTATIN 20 MG PO TAB: 20 mg | ORAL | 90 days supply | Qty: 90 | Fill #2 | Status: AC

## 2022-12-18 ENCOUNTER — Encounter: Admit: 2022-12-18 | Discharge: 2022-12-18 | Payer: MEDICARE

## 2022-12-18 MED ORDER — CELECOXIB 200 MG PO CAP
200 mg | ORAL_CAPSULE | Freq: Two times a day (BID) | ORAL | 1 refills | Status: AC
Start: 2022-12-18 — End: ?
  Filled 2022-12-19: qty 180, 90d supply, fill #1

## 2022-12-18 MED ORDER — FAMOTIDINE 40 MG PO TAB
40 mg | ORAL_TABLET | Freq: Every evening | ORAL | 1 refills | 90.00000 days | Status: AC
Start: 2022-12-18 — End: ?
  Filled 2022-12-19: qty 90, 90d supply, fill #1

## 2022-12-19 ENCOUNTER — Encounter: Admit: 2022-12-19 | Discharge: 2022-12-19 | Payer: MEDICARE

## 2022-12-19 MED ORDER — INSULIN ASPART 100 UNIT/ML SC FLEXPEN
SUBCUTANEOUS | 4 refills
Start: 2022-12-19 — End: ?

## 2022-12-19 MED FILL — ROSUVASTATIN 20 MG PO TAB: 20 mg | ORAL | 90 days supply | Qty: 90 | Fill #2 | Status: AC

## 2022-12-20 ENCOUNTER — Encounter: Admit: 2022-12-20 | Discharge: 2022-12-20 | Payer: MEDICARE

## 2022-12-20 MED ORDER — INSULIN ASPART 100 UNIT/ML SC FLEXPEN
SUBCUTANEOUS | 1 refills | 30.00000 days | Status: AC
Start: 2022-12-20 — End: ?
  Filled 2022-12-23: qty 30, 26d supply, fill #1

## 2022-12-21 ENCOUNTER — Encounter: Admit: 2022-12-21 | Discharge: 2022-12-21 | Payer: MEDICARE

## 2022-12-23 ENCOUNTER — Encounter: Admit: 2022-12-23 | Discharge: 2022-12-23 | Payer: MEDICARE

## 2022-12-24 ENCOUNTER — Encounter: Admit: 2022-12-24 | Discharge: 2022-12-24 | Payer: MEDICARE

## 2022-12-24 MED FILL — PRAMIPEXOLE 0.75 MG PO TAB: 0.75 mg | ORAL | 90 days supply | Qty: 270 | Fill #3 | Status: AC

## 2022-12-26 ENCOUNTER — Encounter: Admit: 2022-12-26 | Discharge: 2022-12-26 | Payer: MEDICARE

## 2022-12-26 MED FILL — LEVOTHYROXINE 88 MCG PO TAB: 88 mcg | ORAL | 90 days supply | Qty: 90 | Fill #2 | Status: AC

## 2022-12-26 MED FILL — GEMTESA 75 MG PO TAB: 75 mg | ORAL | 30 days supply | Qty: 30 | Fill #2 | Status: AC

## 2022-12-28 ENCOUNTER — Encounter: Admit: 2022-12-28 | Discharge: 2022-12-28 | Payer: MEDICARE

## 2022-12-30 ENCOUNTER — Encounter: Admit: 2022-12-30 | Discharge: 2022-12-30 | Payer: MEDICARE

## 2022-12-31 ENCOUNTER — Encounter: Admit: 2022-12-31 | Discharge: 2022-12-31 | Payer: MEDICARE

## 2023-01-01 ENCOUNTER — Encounter: Admit: 2023-01-01 | Discharge: 2023-01-01 | Payer: MEDICARE

## 2023-01-01 MED FILL — CELECOXIB 200 MG PO CAP: 200 mg | ORAL | 90 days supply | Qty: 180 | Fill #1 | Status: AC

## 2023-01-01 MED FILL — FAMOTIDINE 40 MG PO TAB: 40 mg | ORAL | 90 days supply | Qty: 90 | Fill #1 | Status: AC

## 2023-01-07 ENCOUNTER — Encounter: Admit: 2023-01-07 | Discharge: 2023-01-07 | Payer: MEDICARE

## 2023-01-08 ENCOUNTER — Encounter: Admit: 2023-01-08 | Discharge: 2023-01-08 | Payer: MEDICARE

## 2023-01-08 ENCOUNTER — Ambulatory Visit: Admit: 2023-01-08 | Discharge: 2023-01-09 | Payer: MEDICARE

## 2023-01-08 DIAGNOSIS — M0579 Rheumatoid arthritis with rheumatoid factor of multiple sites without organ or systems involvement: Secondary | ICD-10-CM

## 2023-01-08 MED ORDER — ABATACEPT IVPB
750 mg | Freq: Once | INTRAVENOUS | 0 refills
Start: 2023-01-08 — End: ?

## 2023-01-08 MED ORDER — ABATACEPT IVPB
750 mg | Freq: Once | INTRAVENOUS | 0 refills | Status: CP
Start: 2023-01-08 — End: ?
  Administered 2023-01-08 (×2): 750 mg via INTRAVENOUS

## 2023-01-08 NOTE — Progress Notes
Pt arrived for Orencia infusion. Tolerated well. No new concerns this visit. Wheeled out with family.

## 2023-01-09 DIAGNOSIS — Z79899 Other long term (current) drug therapy: Secondary | ICD-10-CM

## 2023-01-10 ENCOUNTER — Encounter: Admit: 2023-01-10 | Discharge: 2023-01-10 | Payer: MEDICARE

## 2023-01-13 ENCOUNTER — Encounter: Admit: 2023-01-13 | Discharge: 2023-01-13 | Payer: MEDICARE

## 2023-01-14 ENCOUNTER — Encounter: Admit: 2023-01-14 | Discharge: 2023-01-14 | Payer: MEDICARE

## 2023-01-15 ENCOUNTER — Ambulatory Visit: Admit: 2023-01-15 | Discharge: 2023-01-15 | Payer: MEDICARE

## 2023-01-15 ENCOUNTER — Encounter: Admit: 2023-01-15 | Discharge: 2023-01-15 | Payer: MEDICARE

## 2023-01-15 ENCOUNTER — Ambulatory Visit: Admit: 2023-01-15 | Discharge: 2023-01-16 | Payer: MEDICARE

## 2023-01-15 DIAGNOSIS — K760 Fatty (change of) liver, not elsewhere classified: Secondary | ICD-10-CM

## 2023-01-15 DIAGNOSIS — G47419 Narcolepsy without cataplexy: Secondary | ICD-10-CM

## 2023-01-15 DIAGNOSIS — I2510 Atherosclerotic heart disease of native coronary artery without angina pectoris: Secondary | ICD-10-CM

## 2023-01-15 DIAGNOSIS — Z79899 Other long term (current) drug therapy: Secondary | ICD-10-CM

## 2023-01-15 DIAGNOSIS — R55 Syncope and collapse: Secondary | ICD-10-CM

## 2023-01-15 DIAGNOSIS — E7849 Other hyperlipidemia: Secondary | ICD-10-CM

## 2023-01-15 DIAGNOSIS — R413 Other amnesia: Secondary | ICD-10-CM

## 2023-01-15 DIAGNOSIS — F445 Conversion disorder with seizures or convulsions: Secondary | ICD-10-CM

## 2023-01-15 DIAGNOSIS — R54 Age-related physical debility: Secondary | ICD-10-CM

## 2023-01-15 DIAGNOSIS — Z9981 Dependence on supplemental oxygen: Secondary | ICD-10-CM

## 2023-01-15 DIAGNOSIS — F3341 Major depressive disorder, recurrent, in partial remission: Secondary | ICD-10-CM

## 2023-01-15 DIAGNOSIS — M0579 Rheumatoid arthritis with rheumatoid factor of multiple sites without organ or systems involvement: Secondary | ICD-10-CM

## 2023-01-15 DIAGNOSIS — W19XXXA Unspecified fall, initial encounter: Secondary | ICD-10-CM

## 2023-01-15 DIAGNOSIS — G629 Polyneuropathy, unspecified: Secondary | ICD-10-CM

## 2023-01-15 DIAGNOSIS — E034 Atrophy of thyroid (acquired): Secondary | ICD-10-CM

## 2023-01-15 DIAGNOSIS — C801 Malignant (primary) neoplasm, unspecified: Secondary | ICD-10-CM

## 2023-01-15 DIAGNOSIS — M4802 Spinal stenosis, cervical region: Secondary | ICD-10-CM

## 2023-01-15 DIAGNOSIS — M069 Rheumatoid arthritis, unspecified: Secondary | ICD-10-CM

## 2023-01-15 DIAGNOSIS — M1990 Unspecified osteoarthritis, unspecified site: Secondary | ICD-10-CM

## 2023-01-15 DIAGNOSIS — R32 Unspecified urinary incontinence: Secondary | ICD-10-CM

## 2023-01-15 DIAGNOSIS — R3989 Other symptoms and signs involving the genitourinary system: Secondary | ICD-10-CM

## 2023-01-15 DIAGNOSIS — E669 Obesity, unspecified: Secondary | ICD-10-CM

## 2023-01-15 DIAGNOSIS — E119 Type 2 diabetes mellitus without complications: Secondary | ICD-10-CM

## 2023-01-15 DIAGNOSIS — G259 Extrapyramidal and movement disorder, unspecified: Secondary | ICD-10-CM

## 2023-01-15 DIAGNOSIS — R1319 Other dysphagia: Secondary | ICD-10-CM

## 2023-01-15 DIAGNOSIS — U071 COVID-19 virus infection: Secondary | ICD-10-CM

## 2023-01-15 DIAGNOSIS — M48061 Spinal stenosis, lumbar region without neurogenic claudication: Secondary | ICD-10-CM

## 2023-01-15 DIAGNOSIS — R911 Solitary pulmonary nodule: Secondary | ICD-10-CM

## 2023-01-15 DIAGNOSIS — M25552 Pain in left hip: Secondary | ICD-10-CM

## 2023-01-15 DIAGNOSIS — K5790 Diverticulosis of intestine, part unspecified, without perforation or abscess without bleeding: Secondary | ICD-10-CM

## 2023-01-15 DIAGNOSIS — R29898 Other symptoms and signs involving the musculoskeletal system: Secondary | ICD-10-CM

## 2023-01-15 DIAGNOSIS — J309 Allergic rhinitis, unspecified: Secondary | ICD-10-CM

## 2023-01-15 DIAGNOSIS — H547 Unspecified visual loss: Secondary | ICD-10-CM

## 2023-01-15 DIAGNOSIS — K219 Gastro-esophageal reflux disease without esophagitis: Secondary | ICD-10-CM

## 2023-01-15 DIAGNOSIS — E039 Hypothyroidism, unspecified: Secondary | ICD-10-CM

## 2023-01-15 DIAGNOSIS — J40 Bronchitis, not specified as acute or chronic: Secondary | ICD-10-CM

## 2023-01-15 DIAGNOSIS — R4189 Other symptoms and signs involving cognitive functions and awareness: Secondary | ICD-10-CM

## 2023-01-15 DIAGNOSIS — E1165 Type 2 diabetes mellitus with hyperglycemia: Secondary | ICD-10-CM

## 2023-01-15 DIAGNOSIS — G479 Sleep disorder, unspecified: Secondary | ICD-10-CM

## 2023-01-15 DIAGNOSIS — J45909 Unspecified asthma, uncomplicated: Secondary | ICD-10-CM

## 2023-01-15 DIAGNOSIS — G4733 Obstructive sleep apnea (adult) (pediatric): Secondary | ICD-10-CM

## 2023-01-15 DIAGNOSIS — E785 Hyperlipidemia, unspecified: Secondary | ICD-10-CM

## 2023-01-15 LAB — URINALYSIS, MICROSCOPIC

## 2023-01-15 LAB — CBC AND DIFF
HEMATOCRIT: 37 % (ref 36–45)
HEMOGLOBIN: 12 g/dL (ref 12.0–15.0)
RBC COUNT: 4.4 M/UL (ref 4.0–5.0)
WBC COUNT: 5.5 K/UL (ref 4.5–11.0)

## 2023-01-15 LAB — PROTEIN/CR RATIO,UR RAN
UR CREATININE, RAN: 52 mg/dL (ref 0.4–1.00)
UR TOTAL PROTEIN,RAN: 4 mg/dL — ABNORMAL HIGH (ref 7–25)

## 2023-01-15 LAB — C3 COMPLEMENT 3: C3: 134 mg/dL (ref 88–200)

## 2023-01-15 LAB — COMPREHENSIVE METABOLIC PANEL
GLUCOSE,PANEL: 238 mg/dL — ABNORMAL HIGH (ref 70–100)
POTASSIUM: 3.8 MMOL/L (ref 3.5–5.1)
SODIUM: 137 MMOL/L (ref 137–147)

## 2023-01-15 LAB — IMMUNOGLOBULINS-IGA,IGG,IGM
IGA: 138 mg/dL (ref 70–390)
IGG: 815 mg/dL — ABNORMAL HIGH (ref 762–1488)
IGM: 109 mg/dL (ref 38–328)

## 2023-01-15 LAB — C4 COMPLEMENT 4: C4: 32 mg/dL (ref 10–49)

## 2023-01-15 LAB — URINALYSIS DIPSTICK
LEUKOCYTES: NEGATIVE
URINE BILE: NEGATIVE mL/min (ref 0–0.20)
URINE KETONE: NEGATIVE K/UL (ref 3–12)

## 2023-01-15 LAB — SED RATE: ESR: 32 mm/h — ABNORMAL HIGH (ref 0–30)

## 2023-01-15 LAB — CREATINE KINASE-CPK: CK TOTAL: 273 U/L — ABNORMAL HIGH (ref 21–215)

## 2023-01-15 LAB — C REACTIVE PROTEIN (CRP): C-REACTIVE PROTEIN: 0.3 mg/dL (ref <1.0)

## 2023-01-15 MED FILL — CELECOXIB 200 MG PO CAP: 200 mg | ORAL | 90 days supply | Status: CN

## 2023-01-15 MED FILL — FAMOTIDINE 40 MG PO TAB: 40 mg | ORAL | 90 days supply | Status: CN

## 2023-01-16 ENCOUNTER — Encounter: Admit: 2023-01-16 | Discharge: 2023-01-16 | Payer: MEDICARE

## 2023-01-16 ENCOUNTER — Ambulatory Visit: Admit: 2023-01-16 | Discharge: 2023-01-15 | Payer: MEDICARE

## 2023-01-16 DIAGNOSIS — M0579 Rheumatoid arthritis with rheumatoid factor of multiple sites without organ or systems involvement: Secondary | ICD-10-CM

## 2023-01-16 DIAGNOSIS — M25552 Pain in left hip: Secondary | ICD-10-CM

## 2023-01-16 DIAGNOSIS — Z79899 Other long term (current) drug therapy: Secondary | ICD-10-CM

## 2023-01-16 DIAGNOSIS — G629 Polyneuropathy, unspecified: Secondary | ICD-10-CM

## 2023-01-16 MED FILL — PANTOPRAZOLE 40 MG PO TBEC: 40 mg | ORAL | 90 days supply | Qty: 180 | Fill #3 | Status: CP

## 2023-01-16 MED FILL — CELECOXIB 200 MG PO CAP: 200 mg | ORAL | 90 days supply | Qty: 180 | Fill #2 | Status: CP

## 2023-01-16 MED FILL — FAMOTIDINE 40 MG PO TAB: 40 mg | ORAL | 90 days supply | Qty: 90 | Fill #2 | Status: CP

## 2023-01-16 NOTE — Telephone Encounter
-----  Message from Emmaline Life, MD sent at 01/15/2023  4:15 PM CST -----  Regarding: RTX education  Can we provide RTX (indication: RA) education for the patient? Thank you!

## 2023-01-16 NOTE — Progress Notes
Attempted to reach patient to provide education on rituximab. Left voicemail requesting a call back. Provided patient with pharmacist clinic phone number ([phone]). If no return call from patient, will attempt again at a later date.

## 2023-01-16 NOTE — Progress Notes
Specialty Medication Discontinuation of Treatment    Medication name: [name] CLICKJECT 502 MG/ML SC ATIN    I spoke with [name] and they stated they are no longer taking their medication due to lack of benefit. Patient switch from Orencia injections to infusions on 07/22/22. Thus, the patient will be removed from the specialty pharmacy patient management program.    The patient stated their provider's office is aware they are no longer taking the medication.    The medication has been removed from the patient's active medication list. Should the patient's care team wish to restart the medication, a new prescription will need to be sent to [facility].    The patient's questions and concerns were addressed as needed. The patient was instructed to contact their healthcare provider if their symptoms or health problems require medical attention.    Vanesa Renier, PHARMD

## 2023-01-17 ENCOUNTER — Encounter: Admit: 2023-01-17 | Discharge: 2023-01-17 | Payer: MEDICARE

## 2023-01-17 DIAGNOSIS — R748 Abnormal levels of other serum enzymes: Secondary | ICD-10-CM

## 2023-01-17 DIAGNOSIS — R768 Other specified abnormal immunological findings in serum: Secondary | ICD-10-CM

## 2023-01-17 DIAGNOSIS — R310 Gross hematuria: Secondary | ICD-10-CM

## 2023-01-17 DIAGNOSIS — M791 Myalgia, unspecified site: Secondary | ICD-10-CM

## 2023-01-17 DIAGNOSIS — M0579 Rheumatoid arthritis with rheumatoid factor of multiple sites without organ or systems involvement: Secondary | ICD-10-CM

## 2023-01-17 DIAGNOSIS — R918 Other nonspecific abnormal finding of lung field: Secondary | ICD-10-CM

## 2023-01-17 MED ORDER — TRIAMCINOLONE ACETONIDE 40 MG/ML IJ SUSP
80 mg | Freq: Once | INTRAMUSCULAR | 0 refills | 22.00000 days | Status: AC
Start: 2023-01-17 — End: ?

## 2023-01-17 MED ORDER — TRIAMCINOLONE ACETONIDE 40 MG/ML IJ SUSP
80 mg | Freq: Once | INTRAMUSCULAR | 0 refills | 22.00000 days | Status: DC
Start: 2023-01-17 — End: 2023-01-17

## 2023-01-17 NOTE — Telephone Encounter
Request received from provider to reach out to pt to offer either oral steroid of send order to PCP for IM Kenalog injection.   Detailed VM left for pt to return call to discuss options.

## 2023-01-22 ENCOUNTER — Encounter: Admit: 2023-01-22 | Discharge: 2023-01-22 | Payer: MEDICARE

## 2023-01-24 ENCOUNTER — Encounter: Admit: 2023-01-24 | Discharge: 2023-01-24 | Payer: MEDICARE

## 2023-01-24 DIAGNOSIS — I5032 Chronic diastolic (congestive) heart failure: Secondary | ICD-10-CM

## 2023-01-24 MED ORDER — SPIRONOLACTONE 25 MG PO TAB
25 mg | ORAL_TABLET | Freq: Every day | ORAL | 3 refills | 90.00000 days | Status: AC
Start: 2023-01-24 — End: ?
  Filled 2023-01-26: qty 90, 90d supply, fill #1

## 2023-01-26 ENCOUNTER — Encounter: Admit: 2023-01-26 | Discharge: 2023-01-26 | Payer: MEDICARE

## 2023-01-26 MED FILL — FOLIC ACID 1 MG PO TAB: 1 mg | ORAL | 30 days supply | Qty: 120 | Fill #6 | Status: AC

## 2023-01-27 ENCOUNTER — Encounter: Admit: 2023-01-27 | Discharge: 2023-01-27 | Payer: MEDICARE

## 2023-01-28 ENCOUNTER — Encounter: Admit: 2023-01-28 | Discharge: 2023-01-28 | Payer: MEDICARE

## 2023-01-28 DIAGNOSIS — M1990 Unspecified osteoarthritis, unspecified site: Secondary | ICD-10-CM

## 2023-01-28 DIAGNOSIS — R1319 Other dysphagia: Secondary | ICD-10-CM

## 2023-01-28 DIAGNOSIS — G4733 Obstructive sleep apnea (adult) (pediatric): Secondary | ICD-10-CM

## 2023-01-28 DIAGNOSIS — E782 Mixed hyperlipidemia: Secondary | ICD-10-CM

## 2023-01-28 DIAGNOSIS — Z8679 Personal history of other diseases of the circulatory system: Secondary | ICD-10-CM

## 2023-01-28 DIAGNOSIS — M48061 Spinal stenosis, lumbar region without neurogenic claudication: Secondary | ICD-10-CM

## 2023-01-28 DIAGNOSIS — R413 Other amnesia: Secondary | ICD-10-CM

## 2023-01-28 DIAGNOSIS — H547 Unspecified visual loss: Secondary | ICD-10-CM

## 2023-01-28 DIAGNOSIS — J40 Bronchitis, not specified as acute or chronic: Secondary | ICD-10-CM

## 2023-01-28 DIAGNOSIS — K760 Fatty (change of) liver, not elsewhere classified: Secondary | ICD-10-CM

## 2023-01-28 DIAGNOSIS — R32 Unspecified urinary incontinence: Secondary | ICD-10-CM

## 2023-01-28 DIAGNOSIS — E7849 Other hyperlipidemia: Secondary | ICD-10-CM

## 2023-01-28 DIAGNOSIS — Z136 Encounter for screening for cardiovascular disorders: Secondary | ICD-10-CM

## 2023-01-28 DIAGNOSIS — F3341 Major depressive disorder, recurrent, in partial remission: Secondary | ICD-10-CM

## 2023-01-28 DIAGNOSIS — R4189 Other symptoms and signs involving cognitive functions and awareness: Secondary | ICD-10-CM

## 2023-01-28 DIAGNOSIS — E669 Obesity, unspecified: Secondary | ICD-10-CM

## 2023-01-28 DIAGNOSIS — I2510 Atherosclerotic heart disease of native coronary artery without angina pectoris: Secondary | ICD-10-CM

## 2023-01-28 DIAGNOSIS — I5032 Chronic diastolic (congestive) heart failure: Secondary | ICD-10-CM

## 2023-01-28 DIAGNOSIS — R911 Solitary pulmonary nodule: Secondary | ICD-10-CM

## 2023-01-28 DIAGNOSIS — R3989 Other symptoms and signs involving the genitourinary system: Secondary | ICD-10-CM

## 2023-01-28 DIAGNOSIS — E034 Atrophy of thyroid (acquired): Secondary | ICD-10-CM

## 2023-01-28 DIAGNOSIS — R54 Age-related physical debility: Secondary | ICD-10-CM

## 2023-01-28 DIAGNOSIS — U071 COVID-19 virus infection: Secondary | ICD-10-CM

## 2023-01-28 DIAGNOSIS — E785 Hyperlipidemia, unspecified: Secondary | ICD-10-CM

## 2023-01-28 DIAGNOSIS — M069 Rheumatoid arthritis, unspecified: Secondary | ICD-10-CM

## 2023-01-28 DIAGNOSIS — E119 Type 2 diabetes mellitus without complications: Secondary | ICD-10-CM

## 2023-01-28 DIAGNOSIS — G47419 Narcolepsy without cataplexy: Secondary | ICD-10-CM

## 2023-01-28 DIAGNOSIS — K5790 Diverticulosis of intestine, part unspecified, without perforation or abscess without bleeding: Secondary | ICD-10-CM

## 2023-01-28 DIAGNOSIS — R29898 Other symptoms and signs involving the musculoskeletal system: Secondary | ICD-10-CM

## 2023-01-28 DIAGNOSIS — G259 Extrapyramidal and movement disorder, unspecified: Secondary | ICD-10-CM

## 2023-01-28 DIAGNOSIS — M4802 Spinal stenosis, cervical region: Secondary | ICD-10-CM

## 2023-01-28 DIAGNOSIS — G479 Sleep disorder, unspecified: Secondary | ICD-10-CM

## 2023-01-28 DIAGNOSIS — G629 Polyneuropathy, unspecified: Secondary | ICD-10-CM

## 2023-01-28 DIAGNOSIS — E1165 Type 2 diabetes mellitus with hyperglycemia: Secondary | ICD-10-CM

## 2023-01-28 DIAGNOSIS — E039 Hypothyroidism, unspecified: Secondary | ICD-10-CM

## 2023-01-28 DIAGNOSIS — F445 Conversion disorder with seizures or convulsions: Secondary | ICD-10-CM

## 2023-01-28 DIAGNOSIS — Z9981 Dependence on supplemental oxygen: Secondary | ICD-10-CM

## 2023-01-28 DIAGNOSIS — J309 Allergic rhinitis, unspecified: Secondary | ICD-10-CM

## 2023-01-28 DIAGNOSIS — Z79899 Other long term (current) drug therapy: Secondary | ICD-10-CM

## 2023-01-28 DIAGNOSIS — J45909 Unspecified asthma, uncomplicated: Secondary | ICD-10-CM

## 2023-01-28 DIAGNOSIS — R55 Syncope and collapse: Secondary | ICD-10-CM

## 2023-01-28 DIAGNOSIS — K219 Gastro-esophageal reflux disease without esophagitis: Secondary | ICD-10-CM

## 2023-01-28 DIAGNOSIS — C801 Malignant (primary) neoplasm, unspecified: Secondary | ICD-10-CM

## 2023-01-28 MED ORDER — INSULIN DEGLUDEC 200 UNIT/ML (3 ML) SC INPN
100 [IU] | Freq: Every evening | SUBCUTANEOUS | 0 refills | 30.00000 days | Status: AC
Start: 2023-01-28 — End: ?
  Filled 2023-01-29: qty 9, 18d supply, fill #1

## 2023-01-28 MED FILL — VENLAFAXINE 150 MG PO CP24: 150 mg | ORAL | 90 days supply | Qty: 90 | Fill #4 | Status: AC

## 2023-01-28 NOTE — Assessment & Plan Note
Last stress study 05/2022 was non-ischemic.

## 2023-01-28 NOTE — Progress Notes
Date of Service: 01/28/2023    Hailey Rodgers is a 72 y.o. female.       HPI     Hailey Rodgers was in the [place] clinic today for follow-up regarding diastolic heart failure, dyslipidemia, and obstructive sleep apnea.    When I last saw her in August, 2023, she basically slept through the entire visit and her LPN daughter did all of the talking.    Hailey Rodgers was awake through the visit today and was here by herself.  She still feels tired and it does not sound like her CPAP is working correctly for her.    She is not having particular trouble with fluid retention or increased breathlessness.  She denies any palpitations or lightheadedness.  She has had no TIA or stroke symptoms.         Vitals:    01/28/23 1334   BP: 118/58   BP Source: Arm, Left Upper   Pulse: 93   SpO2: 96%   O2 Device: None (Room air)   PainSc: Zero   Weight: 92.2 kg (203 lb 3.2 oz)   Height: 170.2 cm (5' 7)     Body mass index is 31.83 kg/m?Hailey Rodgers     Past Medical History  Patient Active Problem List    Diagnosis Date Noted    Flare of rheumatoid arthritis (HCC) 05/23/2022    Chest pain 05/22/2022    Chronic heart failure with preserved ejection fraction (HCC) 05/02/2022    Rheumatoid arthritis involving multiple sites with positive rheumatoid factor (HCC) 04/24/2022    Pain in both lower extremities 08/04/2021    Joint pain 05/22/2021    Primary osteoarthritis involving multiple joints 01/05/2021    High risk medication use 01/04/2021    Rheumatoid arthritis flare (HCC) 11/09/2020    Syncope 10/25/2020    Seasonal allergic rhinitis 10/25/2020    Essential hypertension 10/25/2020    Coronary artery calcification 10/25/2020    Rheumatoid arthritis (HCC) 10/06/2020    Hypothyroidism due to acquired atrophy of thyroid 10/06/2020    Age-related physical debility 10/06/2020    Gastroesophageal reflux disease without esophagitis 10/06/2020    Mixed hyperlipidemia 10/06/2020    COVID-19 virus infection 10/06/2020     06/2020      Diverticulosis 10/06/2020 Fatty liver disease, nonalcoholic     Frequent falls 06/27/2020    Class 1 obesity due to excess calories in adult 03/09/2020    Memory impairment 09/30/2019    Seborrheic keratosis 11/09/2018    Intrinsic atopic dermatitis 11/09/2018    Poor sleep 01/26/2018    Family problems 01/26/2018    Panic disorder without agoraphobia 01/26/2018    Actinic keratosis 01/05/2018    Cervical radiculopathy at C8 12/30/2017    Cubital tunnel syndrome on left 12/30/2017    Persistent depressive disorder with anxious distress, currently moderate 10/21/2017    Pulmonary nodule, left 02/04/2017     10/06/2020 5.  Right upper lobe groundglass nodule measuring 5 mm which may be   infectious or inflammatory. Multiple additional sub-5 mm nodules   bilaterally, most lymph nodes or granulomas. Follow-up CT in 12 months   recommended for further evaluation if clinically indicated.     Last Assessment & Plan:   Formatting of this note might be different from the original.  Incidentally noted on the CT chest 4 mm nodule.  Repeat CT scan in 12 months      Dysphagia 02/04/2017     Last Assessment & Plan:   Formatting of this  note might be different from the original.  Patient was going outpatient evaluation for her dysphagia and had EGD done today,  EGD 02/04/17  Impressions:    Abnormal Motility- tertiary contractions noted. (Dilation (empiric) with no  subtle rings or strictures, Biopsy (to exclude EoE).    Normal mucosa in the whole stomach.    Normal mucosa in the whole examined duodenum.    Further evaluation per gastroenterology.      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    Cataract 05/11/2015    Dry eye 05/11/2015    MGD (meibomian gland dysfunction) 05/11/2015    Cervical stenosis of spine 04/10/2015    Stress incontinence 03/30/2015    Aortic insufficiency 03/27/2015    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 Bipap at 18/14 cm H2O effective    Pt set up with BiPAP @ 18/14cm H2O on 06/04/22.  DME: [facility] [place] / [place]  Insurance: Medicare  If the patient has to meet compliance, their compliance window will be from 08/04/22 to 09/01/22.       Restless legs syndrome (RLS) 09/30/2013    Hypothyroidism (acquired) 06/08/2012         Review of Systems   Constitutional: Negative.   HENT:  Positive for congestion, hoarse voice and sore throat.    Eyes: Negative.    Cardiovascular: Negative.    Respiratory:  Positive for cough and sputum production.    Endocrine: Negative.    Hematologic/Lymphatic: Negative.    Skin:  Positive for poor wound healing.   Musculoskeletal: Negative.    Gastrointestinal: Negative.    Genitourinary: Negative.    Neurological: Negative.    Psychiatric/Behavioral: Negative.     Allergic/Immunologic: Negative.        Physical Exam    Physical Exam   General Appearance: no distress   Skin: warm, no ulcers or xanthomas   Digits and Nails: no cyanosis or clubbing   Eyes: conjunctivae and lids normal, pupils are equal and round   Teeth/Gums/Palate: dentition unremarkable, no lesions   Lips & Oral Mucosa: no pallor or cyanosis   Neck Veins: normal JVP , neck veins are not distended   Thyroid: no nodules, masses, tenderness or enlargement   Chest Inspection: chest is normal in appearance   Respiratory Effort: breathing comfortably, no respiratory distress   Auscultation/Percussion: lungs clear to auscultation, no rales or rhonchi, no wheezing   PMI: PMI not enlarged or displaced   Cardiac Rhythm: regular rhythm and normal rate   Cardiac Auscultation: S1,  S2 normal, no rub, no gallop   Murmurs: no murmur   Peripheral Circulation: normal peripheral circulation   Carotid Arteries: normal carotid upstroke bilaterally, no bruits   Radial Arteries: normal symmetric radial pulses   Abdominal Aorta: no abdominal aortic bruit   Pedal Pulses: normal symmetric pedal pulses   Lower Extremity Edema: no lower extremity edema   Abdominal Exam: soft, non-tender, no masses, bowel sounds normal   Liver & Spleen: no organomegaly   Gait & Station: walks without assistance   Muscle Strength: normal muscle tone   Orientation: oriented to time, place and person   Affect & Mood: appropriate and sustained affect   Language and Memory: patient responsive and seems to comprehend information   Neurologic Exam: neurological assessment grossly intact   Other: moves all extremities      Cardiovascular Health Factors  Vitals BP Readings from Last 3 Encounters:   01/28/23 118/58   01/15/23 115/55   01/08/23 127/56     Wt Readings from Last 3 Encounters:   01/28/23 92.2 kg (203 lb 3.2 oz)   01/15/23 94.8 kg (209 lb)   01/08/23 (P) 95.8 kg (211 lb 3.2 oz)     BMI Readings from Last 3 Encounters:   01/28/23 31.83 kg/m?   01/15/23 33.73 kg/m?   01/08/23 (P) 33.08 kg/m?      Smoking Social History     Tobacco Use   Smoking Status Never   Smokeless Tobacco Never      Lipid Profile Cholesterol   Date Value Ref Range Status   08/08/2022 205 (H) <201 Final     HDL   Date Value Ref Range Status   08/08/2022 47 Final     LDL   Date Value Ref Range Status   08/08/2022 127 (H) <100 Final     Triglycerides   Date Value Ref Range Status   08/08/2022 168  Final      Blood Sugar Hemoglobin A1C   Date Value Ref Range Status   08/04/2021 8.3 (H) 4.0 - 6.0 % Final     Comment:     The ADA recommends that most patients with type 1 and type 2 diabetes maintain   an A1c level <7%.       Glucose   Date Value Ref Range Status   01/15/2023 238 (H) 70 - 100 MG/DL Final   62/13/0865 784 (H) 70 - 100 MG/DL Final   69/62/9528 413 (H) 70 - 100 MG/DL Final   24/40/1027 90 70 - 110 MG/DL Final     Glucose, POC   Date Value Ref Range Status   09/02/2022 209 (H) 70 - 100 MG/DL Final   25/36/6440 92 70 - 100 MG/DL Final   34/74/2595 638 (H) 70 - 100 MG/DL Final          Problems Addressed Today  Encounter Diagnoses   Name Primary?    Screening for heart disease     Mixed hyperlipidemia     Chronic heart failure with preserved ejection fraction (HCC)     History of cardiac edema     Coronary artery calcification     Obstructive sleep apnea        Assessment and Plan       Obstructive sleep apnea  She just changed vendors.  She sees a sleep specialist at [facility], Dr. Clarene Critchley.    Coronary artery calcification  Last stress study 05/2022 was non-ischemic.      Current Medications (including today's  revisions)   abatacept (ORENCIA) 125 mg/mL syringe     acetaminophen (TYLENOL EXTRA STRENGTH) 500 mg tablet Take two tablets by mouth every 6 hours as needed. Max of 4,000 mg of acetaminophen in 24 hours.  Indications: pain    aspirin 81 mg chewable tablet Chew one tablet by mouth at bedtime daily.    blood-glucose sensor (DEXCOM G6 SENSOR MISC) Use  as directed.    calcium carbonate (CALTRATE) 600 mg calcium (1,500 mg) tablet Take two tablets by mouth twice daily.    celecoxib (CELEBREX) 200 mg capsule Take one capsule by mouth twice daily.    CHOLEcalciferoL (vitamin D3) (OPTIMAL D3) 50,000 units capsule Take one capsule by mouth every 7 days.    cyclobenzaprine (FLEXERIL) 5 mg tablet Take one tablet by mouth three times daily as needed for muscle spasms.    docusate (COLACE) 100 mg capsule Take two capsules by mouth twice daily.    famotidine (PEPCID) 40 mg tablet Take one tablet by mouth at bedtime daily.    famotidine (PEPCID) 40 mg tablet Take one tablet by mouth at bedtime daily.    ferrous sulfate (FEOSOL) 325 mg (65 mg iron) tablet Take one tablet by mouth twice daily. Take on an empty stomach at least 1 hour before or 2 hours after food.    fexofenadine(+) (ALLEGRA) 180 mg tablet Take one tablet by mouth daily.    fluocinolone acetonide oil (DERMOTIC OIL) 0.01 % otic drops INSTILL 3 DROPS INTO NOSE DAILY AS NEEDED    folic acid (FOLVITE) 1 mg tablet Take four tablets by mouth daily.    furosemide (LASIX) 40 mg tablet Take one tablet by mouth every morning. Take 1 tablet (40 mg) by mouth in the morning    gabapentin (NEURONTIN) 300 mg capsule     HYDROcodone/acetaminophen (NORCO) 5/325 mg tablet Take one tablet by mouth every 4 hours as needed for Pain.    insulin aspart (U-100) (NOVOLOG FLEXPEN U-100 INSULIN) 100 unit/mL (3 mL) PEN Inject 30 units under the skin prior to meals plus add on sliding scale 201-250 = 3 units, 251-300 = 6 units, greater than 300 = 9 units, 117 units daily max.    Insulin Needles (Disposable) (NANO PEN NEEDLE) 32 gauge x 5/32 ndle Use 1 Each as directed before meals and at bedtime.    JARDIANCE 25 mg tablet Take one tablet by mouth daily.    lactobacillus rhamnosus GG (CULTURELLE) 15 billion cell capsule Take one capsule by mouth as directed daily.    levothyroxine (SYNTHROID) 88 mcg tablet Take one tablet by mouth every morning on an empty stomach, at least 30 to 60 minutes before food.    methotrexate sodium 2.5 mg tablet Take eight tablets by mouth every 7 days.    mirabegron (MYRBETRIQ) 50 mg ER tablet Take one tablet by mouth daily.    nitroglycerin (NITROSTAT) 0.4 mg tablet Place one tablet under tongue every 5 minutes as needed for Chest Pain. Max 3 tablets, call 911.    nortriptyline (PAMELOR) 25 mg capsule Take one capsule by mouth at bedtime daily.    nystatin (MYCOSTATIN) 100,000 unit/g topical cream Apply  topically to affected area twice daily as needed.    ondansetron (ZOFRAN ODT) 8 mg rapid dissolve tablet Dissolve one tablet by mouth every 8 hours as needed.    pantoprazole DR (PROTONIX) 40 mg tablet Take one tablet by mouth twice daily.    polyethylene glycol 3350 (MIRALAX) 17 g packet Take one packet  by mouth twice daily as needed.    pramipexole (MIRAPEX) 0.75 mg tablet Take one tablet by mouth three times daily.    rosuvastatin (CRESTOR) 20 mg tablet Take one tablet by mouth daily.    spironolactone (ALDACTONE) 25 mg tablet Take one tablet by mouth daily. Take with food.    triamcinolone acetonide 0.5 % ointment Apply  topically to affected area three times daily as needed.    TRULICITY 3 mg/0.5 mL injection pen     venlafaxine XR (EFFEXOR XR) 150 mg capsule Take one capsule by mouth daily.    vibegron (GEMTESA) 75 mg tablet Take one tablet by mouth at bedtime daily.    vitamins, multi w/minerals 9 mg iron-400 mcg tab Take one tablet by mouth daily.     Total time spent on today's office visit was 30 minutes.  This includes face-to-face in person visit with patient as well as nonface-to-face time including review of the EMR, outside records, labs, radiologic studies, echocardiogram & other cardiovascular studies, formation of treatment plan, after visit summary, future disposition, and lastly on documentation.

## 2023-01-30 ENCOUNTER — Encounter: Admit: 2023-01-30 | Discharge: 2023-01-30 | Payer: MEDICARE

## 2023-01-30 NOTE — Progress Notes
Lior called back and returned call, LVM on the pharmacist cell phone.  Routing to [place], Therapist, sports intended recipient of VM

## 2023-01-31 ENCOUNTER — Encounter: Admit: 2023-01-31 | Discharge: 2023-01-31 | Payer: MEDICARE

## 2023-01-31 MED FILL — GEMTESA 75 MG PO TAB: 75 mg | ORAL | 30 days supply | Qty: 30 | Fill #3 | Status: AC

## 2023-02-03 ENCOUNTER — Encounter: Admit: 2023-02-03 | Discharge: 2023-02-03 | Payer: MEDICARE

## 2023-02-03 ENCOUNTER — Ambulatory Visit: Admit: 2023-02-03 | Discharge: 2023-02-03 | Payer: MEDICARE

## 2023-02-03 DIAGNOSIS — H2513 Age-related nuclear cataract, bilateral: Secondary | ICD-10-CM

## 2023-02-03 DIAGNOSIS — H40003 Preglaucoma, unspecified, bilateral: Secondary | ICD-10-CM

## 2023-02-03 DIAGNOSIS — H04203 Unspecified epiphora, bilateral lacrimal glands: Secondary | ICD-10-CM

## 2023-02-03 MED FILL — INSULIN DEGLUDEC 200 UNIT/ML (3 ML) SC INPN: 200 unit/mL (3 mL) | SUBCUTANEOUS | 18 days supply | Qty: 9 | Fill #1 | Status: AC

## 2023-02-03 NOTE — Patient Instructions
START PATADAY OVER THE COUNTER EYE DROPS

## 2023-02-03 NOTE — Progress Notes
Previous Surgery/lasers  none  Medications  Allergies:   Allergies   Allergen Reactions    Amoxicillin-Pot Clavulanate HIVES, ITCHING and URTICARIA    Gemfibrozil HIVES    Morphine MENTAL STATUS CHANGES     Patient became unresponsive within 1 minute of 4 mg of morphine.  Did not become apneic.  Required narcan    Trazodone HALLUCINATIONS    Adhesive Tape (Rosins) RASH    Atorvastatin SEE COMMENTS     States it caused her to have a fatty liver.   Reaction: LFT elevation; Comment: LIPITOR  Reaction: LFT elevation; Comment: LIPITOR  Reaction: LFT elevation; Comment: LIPITOR  Reaction: LFT elevation; Comment: LIPITOR      Latex RASH    Bupropion SEE COMMENTS     Allergy recorded in SMS: WELLBUTRIN~Reactions: ABNORMAL SEIZUR    Cymbalta [Duloxetine] UNKNOWN    Levaquin [Levofloxacin] DIARRHEA      Drops: tears as needd         Assessment and Plan:    Problem   Epiphora   Glaucoma Suspect of Both Eyes   Cataract         Epiphora  May be due to LL senile ectropion   Will try allergy drops first.     Cataract  Pt with visually significan cataract but not bothered at this time. Will conitnue to monitor for disruptions of ADLs    Glaucoma suspect of both eyes  Baseline field today,   OCT n and m next viist   OK to monitor off drops           NEXT VISIT   6 months HVF 24-2, optos       Narda Bonds, MD   [facility]      HPI:  Patient presents with:  Eye Problem: Pt returns to clinic for 6  month follow up visit with hx of Insulin dependent type 2 diabetes mellitus  Vision Change: Pt states she is still seeing a lot of double vision. She states she had an exam a couple months ago. Denies flashes or floaters. Denies using any gtt. At night her eyes really itch and burn. Gets a lot of puss in the corner of the right eye and the left eye lower lid has a bump on it like a pimple or something.         Exam:  Base Eye Exam       Visual Acuity (Snellen - Linear)         Right Left    Dist cc 20/40 20/30 Dist ph cc 20/40 +1       Correction: Glasses              Tonometry (iCare Tonometer, 2:26 PM)         Right Left    Pressure 17 15              Neuro/Psych       Oriented x3: Yes    Mood/Affect: Normal                  Slit Lamp and Fundus Exam       External Exam         Right Left    External Normal Normal              Slit Lamp Exam         Right Left    Lids/Lashes Meibomian gland dysfunction, Dermatochalasis - upper lid Meibomian gland  dysfunction, Dermatochalasis - upper lid    Conjunctiva/Sclera Pinguecula Pinguecula    Cornea Clear Clear    Anterior Chamber Deep and quiet Deep and quiet    Iris Flat Flat    Lens 2+ Nuclear sclerosis, 2+ Cortical cataract 2+ Nuclear sclerosis, 2+ Cortical cataract    Anterior Vitreous Normal Normal              Fundus Exam         Right Left    Disc Sharp, healthy rim Sharp, healthy rim    C/D Ratio 0.65 0.7                  Refraction       Wearing Rx         Sphere Cylinder Axis Add Horz Prism    Right -2.75 +1.00 030 +2.50 2.5 in    Left -2.50 +0.50 015 +2.50 2.5 in              Final Rx         Sphere Cylinder Axis Add Horz Prism    Right -2.75 +1.00 030 +2.50 2.5 in    Left -2.50 +0.50 015 +2.50 2.5 in      Expiration Date: 02/04/2024                    FUNDUS PHOTOGRAPHY, OU-BOTH EYES          Optos nerve photo  OD: clear vitreous, optic nerve intact rim, no DH  OS: clear vitreous, optic nerve intact rim, no DH           VISUAL FIELD, EXTEND          [facility] Automated exam type was used.     Right Eye  Threshold was 24-2. Strategy was SITA Standard. Reliability was borderline. Progression has improved. Non-Specific Defects     Left Eye  Threshold was 24-2. Strategy was SITA Standard. Reliability was borderline. Progression has improved. Non-Specific Defects

## 2023-02-03 NOTE — Assessment & Plan Note
May be due to LL senile ectropion   Will try allergy drops first.

## 2023-02-03 NOTE — Assessment & Plan Note
Baseline field today,   OCT n and m next viist   OK to monitor off drops

## 2023-02-03 NOTE — Assessment & Plan Note
Pt with visually significan cataract but not bothered at this time. Will conitnue to monitor for disruptions of ADLs

## 2023-02-04 ENCOUNTER — Encounter: Admit: 2023-02-04 | Discharge: 2023-02-04 | Payer: MEDICARE

## 2023-02-05 ENCOUNTER — Encounter: Admit: 2023-02-05 | Discharge: 2023-02-05 | Payer: MEDICARE

## 2023-02-05 NOTE — Telephone Encounter
Order for supplies placed per protocol   DME: [facility]

## 2023-02-10 ENCOUNTER — Encounter: Admit: 2023-02-10 | Discharge: 2023-02-10 | Payer: MEDICARE

## 2023-02-10 ENCOUNTER — Ambulatory Visit: Admit: 2023-02-10 | Discharge: 2023-02-10 | Payer: MEDICARE

## 2023-02-10 DIAGNOSIS — F331 Major depressive disorder, recurrent, moderate: Secondary | ICD-10-CM

## 2023-02-10 DIAGNOSIS — F445 Conversion disorder with seizures or convulsions: Secondary | ICD-10-CM

## 2023-02-10 DIAGNOSIS — F341 Dysthymic disorder: Secondary | ICD-10-CM

## 2023-02-11 ENCOUNTER — Emergency Department: Admit: 2023-02-11 | Discharge: 2023-02-12 | Payer: MEDICARE

## 2023-02-11 ENCOUNTER — Emergency Department: Admit: 2023-02-11 | Discharge: 2023-02-11 | Payer: MEDICARE

## 2023-02-11 ENCOUNTER — Encounter: Admit: 2023-02-11 | Discharge: 2023-02-11 | Payer: MEDICARE

## 2023-02-11 DIAGNOSIS — L0390 Cellulitis, unspecified: Secondary | ICD-10-CM

## 2023-02-11 DIAGNOSIS — I824Y3 Acute embolism and thrombosis of unspecified deep veins of proximal lower extremity, bilateral: Secondary | ICD-10-CM

## 2023-02-11 DIAGNOSIS — M7989 Other specified soft tissue disorders: Secondary | ICD-10-CM

## 2023-02-11 LAB — URINALYSIS DIPSTICK REFLEX TO CULTURE
NITRITE: NEGATIVE mL/min (ref 1.0–4.8)
URINE ASCORBIC ACID, UA: NEGATIVE K/UL (ref 0–0.45)
URINE BILE: NEGATIVE MMOL/L (ref 21–30)
URINE BLOOD: NEGATIVE U/L (ref 7–56)
URINE KETONE: NEGATIVE U/L (ref 7–40)

## 2023-02-11 LAB — URINALYSIS MICROSCOPIC REFLEX TO CULTURE

## 2023-02-11 LAB — COMPREHENSIVE METABOLIC PANEL
BLD UREA NITROGEN: 28 mg/dL — ABNORMAL HIGH (ref 7–25)
CHLORIDE: 101 MMOL/L (ref 98–110)
GLUCOSE,PANEL: 143 mg/dL — ABNORMAL HIGH (ref 70–100)
POTASSIUM: 4.3 MMOL/L (ref 3.5–5.1)
SODIUM: 137 MMOL/L (ref 137–147)

## 2023-02-11 LAB — CBC AND DIFF
ABSOLUTE BASO COUNT: 0 K/UL (ref 0–0.20)
MDW (MONOCYTE DISTRIBUTION WIDTH): 15 (ref <20.7)
WBC COUNT: 6.4 K/UL (ref 4.5–11.0)

## 2023-02-11 LAB — POC LACTATE: LACTIC ACID POC: 0.9 MMOL/L (ref 0.5–2.0)

## 2023-02-11 LAB — BETA HYDROXYBUTYRATE (KETONES): BETA HYDROXYBUTYRATE: 0.1 MMOL/L (ref <0.3)

## 2023-02-11 LAB — PROTIME INR (PT)
INR: 1.1 (ref 0.8–1.2)
PROTIME: 11 s (ref 9.5–14.2)

## 2023-02-11 LAB — PTT (APTT): PTT: 29 s (ref 24.0–36.5)

## 2023-02-11 MED ORDER — DOXYCYCLINE HYCLATE 100 MG PO TAB
100 mg | Freq: Once | ORAL | 0 refills | Status: CP
Start: 2023-02-11 — End: ?
  Administered 2023-02-12: 05:00:00 100 mg via ORAL

## 2023-02-11 MED ORDER — DOXYCYCLINE HYCLATE 100 MG PO TAB
100 mg | ORAL_TABLET | Freq: Two times a day (BID) | ORAL | 0 refills | 8.00000 days | Status: AC
Start: 2023-02-11 — End: ?

## 2023-02-11 MED ORDER — FENTANYL CITRATE (PF) 50 MCG/ML IJ SOLN
50 ug | Freq: Once | INTRAVENOUS | 0 refills | Status: CP
Start: 2023-02-11 — End: ?
  Administered 2023-02-12: 02:00:00 50 ug via INTRAVENOUS

## 2023-02-11 NOTE — ED Notes
ED Initial Provider Note:    This patient was seen in the ED triage area to initiate and expedite the patients ED care when possible.    ED Chief Complaint:   No chief complaint on file.      S: Hailey Rodgers is a 71 y.o. female who presents to the Emergency Department for increasing foot pain/redness after an infection started in her R third toe and has spread to the other toes of that foot.  She also reports she's now having pain in her left foot.  She is currently on antibiotics for this infection (not sure which), and reports low grade fever last noc.      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  Right upper lobe groundglass nodule measuring 5 mm which may be  infectious or inflammatory. Multiple additional sub-5 mm nodules  bilaterally, most lymph nodes or granulomas. Follow-up CT in 12 months  recommended for further evaluation if clinically indicated.     Memory loss     Movement disorder     Narcolepsy     Obesity     On supplemental oxygen therapy 2023    Only at night - No longer use on Bi-Pap now    OSA (obstructive sleep apnea)     Other dysphagia     Other hyperlipidemia 10/06/2020    Peripheral neuropathy     Polypharmacy 01/04/2021    Pseudoseizures     Recurrent major depressive disorder, in partial remission (HCC) 10/06/2020    Rheumatoid arthritis (HCC) 10/06/2020    Sleep disorder     Sleep Apnea & Narcolepsy    Syncope and collapse     Age 19 years old    Type 2 diabetes mellitus with hyperglycemia, with long-term current use of insulin (HCC) 10/06/2020    Urinary incontinence     Urinary problem     urinary frequency    Vision decreased     diplopia       LMP  (LMP Unknown)    O: Brief Physical: small wound to R middle toe, with redness. Mild diffuse redness to bilat feet.    A/P: The patient was seen by me as an initial provider in triage. A brief history and physical was obtained. My exam is intended to be an initial medial screening exam. Initial orders have been placed by me. My working diagnosis is diabetic foot wound, cellulitis.    The patient is deemed appropriate for the main ED. The patient's care will be resumed by the ED provider care team once the patient is roomed in the ED. A more detailed /  complete H&P will be documented by those providers.

## 2023-02-12 NOTE — ED Notes
Pt appropriate and stable for discharge. AVS given and discussed with pt. Pt verbalized understanding with no further questions and/or concerns at this time. Pt agreeable to plan of care. PIV removed, catheter tip intact. Pt A&Ox4, respirations even/unlabored on RA, and exited ED with staff assistance in wheelchair. All paperwork and personal belongings in pt possession at time of departure.

## 2023-02-13 ENCOUNTER — Encounter: Admit: 2023-02-13 | Discharge: 2023-02-13 | Payer: MEDICARE

## 2023-02-13 DIAGNOSIS — R748 Abnormal levels of other serum enzymes: Secondary | ICD-10-CM

## 2023-02-13 DIAGNOSIS — I8290 Acute embolism and thrombosis of unspecified vein: Secondary | ICD-10-CM

## 2023-02-13 DIAGNOSIS — R319 Hematuria, unspecified: Secondary | ICD-10-CM

## 2023-02-13 DIAGNOSIS — M7989 Other specified soft tissue disorders: Secondary | ICD-10-CM

## 2023-02-13 DIAGNOSIS — R310 Gross hematuria: Secondary | ICD-10-CM

## 2023-02-13 DIAGNOSIS — R768 Other specified abnormal immunological findings in serum: Secondary | ICD-10-CM

## 2023-02-13 DIAGNOSIS — R918 Other nonspecific abnormal finding of lung field: Secondary | ICD-10-CM

## 2023-02-13 DIAGNOSIS — M0579 Rheumatoid arthritis with rheumatoid factor of multiple sites without organ or systems involvement: Secondary | ICD-10-CM

## 2023-02-13 DIAGNOSIS — M791 Myalgia, unspecified site: Secondary | ICD-10-CM

## 2023-02-13 NOTE — Telephone Encounter
Yes if she is having infection I would recommend that she postpones her rituximab infusions until she recovers.    I agree that she should hold her methotrexate in the setting of acute infection.    I recommend that she updates [place] when she has recovered from her infection.    Who is managing the blood clots found on her ultrasound?  I suggest she notifies her primary care provider right away as she may need to be evaluated urgently by internal medicine.    I will also add additional blood work as part of her blood clot workup.    Noted her UA results 2/20 with resolution of her hematuria.    Thank you.

## 2023-02-16 ENCOUNTER — Encounter: Admit: 2023-02-16 | Discharge: 2023-02-16 | Payer: MEDICARE

## 2023-02-16 MED ORDER — INSULIN DEGLUDEC 200 UNIT/ML (3 ML) SC INPN
100 [IU] | Freq: Every evening | SUBCUTANEOUS | 0 refills
Start: 2023-02-16 — End: ?

## 2023-02-17 ENCOUNTER — Encounter: Admit: 2023-02-17 | Discharge: 2023-02-17 | Payer: MEDICARE

## 2023-02-17 MED ORDER — INSULIN DEGLUDEC 200 UNIT/ML (3 ML) SC INPN
100 [IU] | Freq: Every evening | SUBCUTANEOUS | 1 refills | 30.00000 days | Status: AC
Start: 2023-02-17 — End: ?
  Filled 2023-02-27: qty 9, 18d supply, fill #1

## 2023-02-18 ENCOUNTER — Encounter: Admit: 2023-02-18 | Discharge: 2023-02-18 | Payer: MEDICARE

## 2023-02-19 ENCOUNTER — Encounter: Admit: 2023-02-19 | Discharge: 2023-02-19 | Payer: MEDICARE

## 2023-02-20 ENCOUNTER — Encounter: Admit: 2023-02-20 | Discharge: 2023-02-20 | Payer: MEDICARE

## 2023-02-21 ENCOUNTER — Encounter: Admit: 2023-02-21 | Discharge: 2023-02-21 | Payer: MEDICARE

## 2023-02-24 ENCOUNTER — Encounter: Admit: 2023-02-24 | Discharge: 2023-02-24 | Payer: MEDICARE

## 2023-02-24 ENCOUNTER — Ambulatory Visit: Admit: 2023-02-24 | Discharge: 2023-02-24 | Payer: MEDICARE

## 2023-02-24 DIAGNOSIS — M791 Myalgia, unspecified site: Secondary | ICD-10-CM

## 2023-02-24 DIAGNOSIS — R310 Gross hematuria: Secondary | ICD-10-CM

## 2023-02-25 ENCOUNTER — Encounter: Admit: 2023-02-25 | Discharge: 2023-02-25 | Payer: MEDICARE

## 2023-02-25 DIAGNOSIS — M0579 Rheumatoid arthritis with rheumatoid factor of multiple sites without organ or systems involvement: Secondary | ICD-10-CM

## 2023-02-25 MED ORDER — CHOLECALCIFEROL (VITAMIN D3) 1,250 MCG (50,000 UNIT) PO CAP
50000 [IU] | ORAL_CAPSULE | ORAL | 5 refills
Start: 2023-02-25 — End: ?

## 2023-02-26 ENCOUNTER — Encounter: Admit: 2023-02-26 | Discharge: 2023-02-26 | Payer: MEDICARE

## 2023-02-27 ENCOUNTER — Encounter: Admit: 2023-02-27 | Discharge: 2023-02-27 | Payer: MEDICARE

## 2023-02-28 ENCOUNTER — Encounter: Admit: 2023-02-28 | Discharge: 2023-02-28 | Payer: MEDICARE

## 2023-03-01 MED FILL — GEMTESA 75 MG PO TAB: 75 mg | ORAL | 30 days supply | Qty: 30 | Fill #4 | Status: AC

## 2023-03-03 ENCOUNTER — Encounter: Admit: 2023-03-03 | Discharge: 2023-03-03 | Payer: MEDICARE

## 2023-03-03 ENCOUNTER — Ambulatory Visit: Admit: 2023-03-03 | Discharge: 2023-03-04 | Payer: MEDICARE

## 2023-03-03 MED ORDER — ACETAMINOPHEN 500 MG PO TAB
500 mg | Freq: Once | ORAL | 0 refills | Status: CP
Start: 2023-03-03 — End: ?
  Administered 2023-03-03: 14:00:00 500 mg via ORAL

## 2023-03-03 MED ORDER — METHYLPREDNISOLONE SOD SUC(PF) 125 MG/2 ML IJ SOLR
100 mg | Freq: Once | INTRAVENOUS | 0 refills
Start: 2023-03-03 — End: ?

## 2023-03-03 MED ORDER — DIPHENHYDRAMINE HCL 25 MG PO CAP
25 mg | Freq: Once | ORAL | 0 refills
Start: 2023-03-03 — End: ?

## 2023-03-03 MED ORDER — METHYLPREDNISOLONE SOD SUC(PF) 125 MG/2 ML IJ SOLR
100 mg | Freq: Once | INTRAVENOUS | 0 refills | Status: CP
Start: 2023-03-03 — End: ?
  Administered 2023-03-03: 14:00:00 100 mg via INTRAVENOUS

## 2023-03-03 MED ORDER — RITUXIMAB IVPB
1000 mg | Freq: Once | INTRAVENOUS | 0 refills
Start: 2023-03-03 — End: ?

## 2023-03-03 MED ORDER — ACETAMINOPHEN 500 MG PO TAB
500 mg | Freq: Once | ORAL | 0 refills
Start: 2023-03-03 — End: ?

## 2023-03-03 MED ORDER — DIPHENHYDRAMINE HCL 25 MG PO CAP
25 mg | Freq: Once | ORAL | 0 refills | Status: CP
Start: 2023-03-03 — End: ?
  Administered 2023-03-03: 14:00:00 25 mg via ORAL

## 2023-03-03 MED ORDER — RITUXIMAB IVPB
1000 mg | Freq: Once | INTRAVENOUS | 0 refills | Status: CP
Start: 2023-03-03 — End: ?
  Administered 2023-03-03 (×2): 1000 mg via INTRAVENOUS

## 2023-03-03 NOTE — Progress Notes
Pt arrived for first dose of Rituxan. Pt tolerated titration from 12.27m/hr to 1071m hr without problem. No new concerns this visit. Will be able to infuse starting at 2599mr titrating up to 100m11m next visit. Pt ambulated with cane from visit.

## 2023-03-04 ENCOUNTER — Encounter: Admit: 2023-03-04 | Discharge: 2023-03-04 | Payer: MEDICARE

## 2023-03-04 DIAGNOSIS — M069 Rheumatoid arthritis, unspecified: Secondary | ICD-10-CM

## 2023-03-04 DIAGNOSIS — E119 Type 2 diabetes mellitus without complications: Secondary | ICD-10-CM

## 2023-03-04 DIAGNOSIS — Z794 Long term (current) use of insulin: Secondary | ICD-10-CM

## 2023-03-05 ENCOUNTER — Encounter: Admit: 2023-03-05 | Discharge: 2023-03-05 | Payer: MEDICARE

## 2023-03-06 ENCOUNTER — Encounter: Admit: 2023-03-06 | Discharge: 2023-03-06 | Payer: MEDICARE

## 2023-03-06 ENCOUNTER — Ambulatory Visit: Admit: 2023-03-06 | Discharge: 2023-03-06 | Payer: MEDICARE

## 2023-03-06 DIAGNOSIS — M0579 Rheumatoid arthritis with rheumatoid factor of multiple sites without organ or systems involvement: Secondary | ICD-10-CM

## 2023-03-06 DIAGNOSIS — Z79899 Other long term (current) drug therapy: Secondary | ICD-10-CM

## 2023-03-06 DIAGNOSIS — F331 Major depressive disorder, recurrent, moderate: Secondary | ICD-10-CM

## 2023-03-06 DIAGNOSIS — F341 Dysthymic disorder: Secondary | ICD-10-CM

## 2023-03-06 DIAGNOSIS — G629 Polyneuropathy, unspecified: Secondary | ICD-10-CM

## 2023-03-06 DIAGNOSIS — F445 Conversion disorder with seizures or convulsions: Secondary | ICD-10-CM

## 2023-03-06 DIAGNOSIS — W19XXXA Unspecified fall, initial encounter: Secondary | ICD-10-CM

## 2023-03-06 DIAGNOSIS — M25552 Pain in left hip: Secondary | ICD-10-CM

## 2023-03-06 DIAGNOSIS — F39 Unspecified mood [affective] disorder: Secondary | ICD-10-CM

## 2023-03-06 MED FILL — FOLIC ACID 1 MG PO TAB: 1 mg | ORAL | 30 days supply | Qty: 120 | Fill #7 | Status: AC

## 2023-03-06 MED FILL — INSULIN ASPART 100 UNIT/ML SC FLEXPEN: 100 unit/mL (3 mL) | SUBCUTANEOUS | 26 days supply | Qty: 30 | Fill #2 | Status: AC

## 2023-03-06 MED FILL — MYRBETRIQ 50 MG PO TB24: 50 mg | ORAL | 90 days supply | Qty: 90 | Fill #3 | Status: AC

## 2023-03-06 MED FILL — NITROGLYCERIN 0.4 MG SL SUBL: 0.4 mg | SUBLINGUAL | 8 days supply | Qty: 25 | Fill #2 | Status: AC

## 2023-03-06 NOTE — Progress Notes
.  Progress Note  START TIME:  2:30 PM  STOP TIME:  3:00 PM     BEHAVIORS/SYMPTOMS/DATA/PATIENT COMPLAINT:  Pt late.  Problems with transportation  Notes problems will crying too much, feeling angry.  She had what sounded like a hypnagogic or hypnopompic hallucination of daughter while in bed. Her house is not cleaned up and smells of urine but she can't keep up.  She was prescribed home health but they are talking badly about her behavior and doctor as well in the notes and not helping with cleanup .  She is not getting along well with husband although loves him.  Doctor recommends nursing care for him but would lose her farm.  Thinking of putting it in daughter's name so it is not split up.      MENTAL STATUS EXAM AND BEHAVIORAL OBSERVATIONS:  The patient is oriented and alert.  Mood is down.  Not as irritable or dysregulated.  Does  not endorse any SI or HI.     INTERVENTIONS/RESPONSE:  Supportive Psychotherapy.  Focus of meeting was on listening and understanding.  Asked for suggestions it was recommended she tell her doctor about the problem of not cleaning her home and also consider not reading the notes if it is just upsetting her.                                      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  Establish care with psychiatrist.          [ X   ]  The proposed treatment plan was discussed with the patient/guardian who was provided the opportunity to ask questions and make suggestions regarding alternative treatment.  If not, why not  Risks/Benefits of Treatment Options Reviewed:  YES     RETURN APPOINTMENT: Two weeks     Lashae Wollenberg E. Yong Channel, Ph.D., [place]  Board Certified in [place] [employer]

## 2023-03-07 ENCOUNTER — Encounter: Admit: 2023-03-07 | Discharge: 2023-03-07 | Payer: MEDICARE

## 2023-03-10 ENCOUNTER — Encounter: Admit: 2023-03-10 | Discharge: 2023-03-10 | Payer: MEDICARE

## 2023-03-10 MED ORDER — CHOLECALCIFEROL (VITAMIN D3) 1,250 MCG (50,000 UNIT) PO CAP
50000 [IU] | ORAL_CAPSULE | ORAL | 5 refills
Start: 2023-03-10 — End: ?

## 2023-03-12 ENCOUNTER — Encounter: Admit: 2023-03-12 | Discharge: 2023-03-12 | Payer: MEDICARE

## 2023-03-12 MED ORDER — CHOLECALCIFEROL (VITAMIN D3) 1,250 MCG (50,000 UNIT) PO CAP
ORAL_CAPSULE | 5 refills | Status: CN
Start: 2023-03-12 — End: ?

## 2023-03-15 ENCOUNTER — Encounter: Admit: 2023-03-15 | Discharge: 2023-03-15 | Payer: MEDICARE

## 2023-03-15 MED ORDER — CHOLECALCIFEROL (VITAMIN D3) 1,250 MCG (50,000 UNIT) PO CAP
50000 [IU] | ORAL_CAPSULE | ORAL | 5 refills
Start: 2023-03-15 — End: ?

## 2023-03-16 ENCOUNTER — Encounter: Admit: 2023-03-16 | Discharge: 2023-03-16 | Payer: MEDICARE

## 2023-03-17 ENCOUNTER — Ambulatory Visit: Admit: 2023-03-17 | Discharge: 2023-03-18 | Payer: MEDICARE

## 2023-03-17 ENCOUNTER — Encounter: Admit: 2023-03-17 | Discharge: 2023-03-17 | Payer: MEDICARE

## 2023-03-17 DIAGNOSIS — M069 Rheumatoid arthritis, unspecified: Secondary | ICD-10-CM

## 2023-03-17 DIAGNOSIS — M7989 Other specified soft tissue disorders: Secondary | ICD-10-CM

## 2023-03-17 DIAGNOSIS — R748 Abnormal levels of other serum enzymes: Secondary | ICD-10-CM

## 2023-03-17 DIAGNOSIS — R310 Gross hematuria: Secondary | ICD-10-CM

## 2023-03-17 LAB — CREATINE KINASE-CPK: CK TOTAL: 178 U/L (ref 21–215)

## 2023-03-17 LAB — LIVER FUNCTION PANEL
ALK PHOSPHATASE: 141 U/L — ABNORMAL HIGH (ref 25–110)
ALT: 28 U/L (ref 7–56)
AST: 23 U/L (ref 7–40)
TOTAL BILIRUBIN: 0.4 mg/dL (ref 0.3–1.2)
TOTAL PROTEIN: 7.1 g/dL (ref 6.0–8.0)

## 2023-03-17 LAB — URINALYSIS, MICROSCOPIC

## 2023-03-17 LAB — CBC AND DIFF
EOSINOPHILS %: 3 % (ref 0–5)
HEMATOCRIT: 40 % (ref 36–45)
HEMOGLOBIN: 13 g/dL (ref 12.0–15.0)
LYMPHOCYTES %: 30 % (ref 24–44)
MCH: 28 pg (ref 26–34)
MCV: 86 FL (ref 80–100)
MPV: 8.3 FL (ref 7–11)
PLATELET COUNT: 268 K/UL (ref 150–400)
RBC COUNT: 4.6 M/UL (ref 4.0–5.0)
WBC COUNT: 6.9 K/UL (ref >60)

## 2023-03-17 LAB — TSH WITH FREE T4 REFLEX: TSH: 2.1 uU/mL (ref 0.35–5.00)

## 2023-03-17 LAB — CREATININE: CREATININE: 0.8 mg/dL (ref 0.4–1.00)

## 2023-03-17 LAB — GGTP: GGTP: 39 U/L (ref 9–64)

## 2023-03-17 MED ORDER — DIPHENHYDRAMINE HCL 25 MG PO CAP
25 mg | Freq: Once | ORAL | 0 refills
Start: 2023-03-17 — End: ?

## 2023-03-17 MED ORDER — RITUXIMAB IVPB
1000 mg | Freq: Once | INTRAVENOUS | 0 refills
Start: 2023-03-17 — End: ?

## 2023-03-17 MED ORDER — METHYLPREDNISOLONE SOD SUC(PF) 125 MG/2 ML IJ SOLR
100 mg | Freq: Once | INTRAVENOUS | 0 refills
Start: 2023-03-17 — End: ?

## 2023-03-17 MED ORDER — METHYLPREDNISOLONE SOD SUC(PF) 125 MG/2 ML IJ SOLR
100 mg | Freq: Once | INTRAVENOUS | 0 refills | Status: CP
Start: 2023-03-17 — End: ?
  Administered 2023-03-17: 17:00:00 100 mg via INTRAVENOUS

## 2023-03-17 MED ORDER — ACETAMINOPHEN 500 MG PO TAB
500 mg | Freq: Once | ORAL | 0 refills | Status: CP
Start: 2023-03-17 — End: ?
  Administered 2023-03-17: 17:00:00 500 mg via ORAL

## 2023-03-17 MED ORDER — ACETAMINOPHEN 500 MG PO TAB
500 mg | Freq: Once | ORAL | 0 refills
Start: 2023-03-17 — End: ?

## 2023-03-17 MED ORDER — DIPHENHYDRAMINE HCL 25 MG PO CAP
25 mg | Freq: Once | ORAL | 0 refills | Status: CP
Start: 2023-03-17 — End: ?
  Administered 2023-03-17: 17:00:00 25 mg via ORAL

## 2023-03-17 MED ORDER — RITUXIMAB IVPB
1000 mg | Freq: Once | INTRAVENOUS | 0 refills | Status: CP
Start: 2023-03-17 — End: ?
  Administered 2023-03-17 (×2): 1000 mg via INTRAVENOUS

## 2023-03-17 NOTE — Progress Notes
Patient presents to infusion clinic for Rituxan infusion.  Patient denies recent concern for infection, fever, or antibiotic use.  Pre-infusion vital signs completed. Peripheral IV started.  and No lab orders in the therapy plan. RN inquired if the patient needs labs drawn for another provider. Patient denied.    : Pre-medications administered, see e-mar for details.    1201: Infusion started, see e-mar for administration details    [id number]: Infusion completed and flushed. See e-mar for administration details and Post infusion vitals completed. No concern for infusion reaction. Patient has no complaints    : PIV discontinued. Patient declined printed AVS. Prompted patient to schedule upcoming appointment(s) at infusion checkout or call infusion scheduling. Patient verbalized understanding. Patient ambulated off unit.     Vitals:    03/17/23 1121 03/17/23 1530   BP: 137/72 139/71   BP Source: Arm, Right Lower Arm, Left Lower   Pulse: 98 95   Temp: 36.4 C (97.6 F) 36.4 C (97.6 F)   Resp: 18    SpO2: 98% 97%   TempSrc: Temporal    Weight: 94.4 kg (208 lb 3.2 oz)

## 2023-03-18 ENCOUNTER — Encounter: Admit: 2023-03-18 | Discharge: 2023-03-18 | Payer: MEDICARE

## 2023-03-18 DIAGNOSIS — R918 Other nonspecific abnormal finding of lung field: Secondary | ICD-10-CM

## 2023-03-18 DIAGNOSIS — R768 Other specified abnormal immunological findings in serum: Secondary | ICD-10-CM

## 2023-03-18 DIAGNOSIS — Z794 Long term (current) use of insulin: Secondary | ICD-10-CM

## 2023-03-18 DIAGNOSIS — I8290 Acute embolism and thrombosis of unspecified vein: Secondary | ICD-10-CM

## 2023-03-18 DIAGNOSIS — M0579 Rheumatoid arthritis with rheumatoid factor of multiple sites without organ or systems involvement: Secondary | ICD-10-CM

## 2023-03-18 DIAGNOSIS — Z79899 Other long term (current) drug therapy: Secondary | ICD-10-CM

## 2023-03-18 DIAGNOSIS — M791 Myalgia, unspecified site: Secondary | ICD-10-CM

## 2023-03-18 DIAGNOSIS — E119 Type 2 diabetes mellitus without complications: Secondary | ICD-10-CM

## 2023-03-18 MED ORDER — CHOLECALCIFEROL (VITAMIN D3) 1,250 MCG (50,000 UNIT) PO CAP
50000 [IU] | ORAL_CAPSULE | ORAL | 5 refills
Start: 2023-03-18 — End: ?

## 2023-03-18 MED FILL — CHOLECALCIFEROL (VITAMIN D3) 1,250 MCG (50,000 UNIT) PO CAP: 50000 units | 84 days supply | Qty: 12 | Fill #1 | Status: AC

## 2023-03-19 ENCOUNTER — Encounter: Admit: 2023-03-19 | Discharge: 2023-03-19 | Payer: MEDICARE

## 2023-03-19 MED ORDER — METHOTREXATE SODIUM 2.5 MG PO TAB
20 mg | ORAL_TABLET | ORAL | 0 refills | Status: AC
Start: 2023-03-19 — End: ?
  Filled 2023-03-22: qty 96, 84d supply, fill #1

## 2023-03-19 NOTE — Telephone Encounter
Refill of MTX requested.  LOV 01/15/23, "-Continue methotrexate 20 mg p.o. once weekly.  Continue DMARD labs every 12 weeks while on methotrexate."  Follow-up 05/28/23.   Last labs drawn 03/17/23. Refilled per Rheumatology standing order protocol.

## 2023-03-20 ENCOUNTER — Encounter: Admit: 2023-03-20 | Discharge: 2023-03-20 | Payer: MEDICARE

## 2023-03-21 ENCOUNTER — Encounter: Admit: 2023-03-21 | Discharge: 2023-03-21 | Payer: MEDICARE

## 2023-03-22 ENCOUNTER — Encounter: Admit: 2023-03-22 | Discharge: 2023-03-22 | Payer: MEDICARE

## 2023-03-23 ENCOUNTER — Encounter: Admit: 2023-03-23 | Discharge: 2023-03-23 | Payer: MEDICARE

## 2023-03-23 MED ORDER — LEVOTHYROXINE 88 MCG PO TAB
88 ug | ORAL_TABLET | Freq: Every day | ORAL | 1 refills
Start: 2023-03-23 — End: ?

## 2023-03-23 MED ORDER — GEMTESA 75 MG PO TAB
75 mg | ORAL_TABLET | Freq: Every evening | ORAL | 3 refills
Start: 2023-03-23 — End: ?

## 2023-03-24 ENCOUNTER — Encounter: Admit: 2023-03-24 | Discharge: 2023-03-24 | Payer: MEDICARE

## 2023-03-24 MED ORDER — GEMTESA 75 MG PO TAB
75 mg | ORAL_TABLET | Freq: Every evening | ORAL | 3 refills | Status: AC
Start: 2023-03-24 — End: ?
  Filled 2023-03-25: qty 30, 30d supply, fill #1

## 2023-03-24 MED ORDER — LEVOTHYROXINE 88 MCG PO TAB
88 ug | ORAL_TABLET | Freq: Every morning | ORAL | 1 refills | 30.00000 days | Status: AC
Start: 2023-03-24 — End: ?
  Filled 2023-03-25: qty 90, 90d supply, fill #1

## 2023-03-25 ENCOUNTER — Encounter: Admit: 2023-03-25 | Discharge: 2023-03-25 | Payer: MEDICARE

## 2023-03-26 ENCOUNTER — Encounter: Admit: 2023-03-26 | Discharge: 2023-03-26 | Payer: MEDICARE

## 2023-03-26 ENCOUNTER — Ambulatory Visit: Admit: 2023-03-26 | Discharge: 2023-03-27 | Payer: MEDICARE

## 2023-03-26 DIAGNOSIS — Z9981 Dependence on supplemental oxygen: Secondary | ICD-10-CM

## 2023-03-26 DIAGNOSIS — R413 Other amnesia: Secondary | ICD-10-CM

## 2023-03-26 DIAGNOSIS — G4733 Obstructive sleep apnea (adult) (pediatric): Secondary | ICD-10-CM

## 2023-03-26 DIAGNOSIS — R4189 Other symptoms and signs involving cognitive functions and awareness: Secondary | ICD-10-CM

## 2023-03-26 DIAGNOSIS — H547 Unspecified visual loss: Secondary | ICD-10-CM

## 2023-03-26 DIAGNOSIS — M48061 Spinal stenosis, lumbar region without neurogenic claudication: Secondary | ICD-10-CM

## 2023-03-26 DIAGNOSIS — E7849 Other hyperlipidemia: Secondary | ICD-10-CM

## 2023-03-26 DIAGNOSIS — E669 Obesity, unspecified: Secondary | ICD-10-CM

## 2023-03-26 DIAGNOSIS — E1165 Type 2 diabetes mellitus with hyperglycemia: Secondary | ICD-10-CM

## 2023-03-26 DIAGNOSIS — U071 COVID-19 virus infection: Secondary | ICD-10-CM

## 2023-03-26 DIAGNOSIS — R32 Unspecified urinary incontinence: Secondary | ICD-10-CM

## 2023-03-26 DIAGNOSIS — R1319 Other dysphagia: Secondary | ICD-10-CM

## 2023-03-26 DIAGNOSIS — K219 Gastro-esophageal reflux disease without esophagitis: Secondary | ICD-10-CM

## 2023-03-26 DIAGNOSIS — R55 Syncope and collapse: Secondary | ICD-10-CM

## 2023-03-26 DIAGNOSIS — J849 Interstitial pulmonary disease, unspecified: Secondary | ICD-10-CM

## 2023-03-26 DIAGNOSIS — G47419 Narcolepsy without cataplexy: Secondary | ICD-10-CM

## 2023-03-26 DIAGNOSIS — I2510 Atherosclerotic heart disease of native coronary artery without angina pectoris: Secondary | ICD-10-CM

## 2023-03-26 DIAGNOSIS — J40 Bronchitis, not specified as acute or chronic: Secondary | ICD-10-CM

## 2023-03-26 DIAGNOSIS — R29898 Other symptoms and signs involving the musculoskeletal system: Secondary | ICD-10-CM

## 2023-03-26 DIAGNOSIS — R54 Age-related physical debility: Secondary | ICD-10-CM

## 2023-03-26 DIAGNOSIS — C801 Malignant (primary) neoplasm, unspecified: Secondary | ICD-10-CM

## 2023-03-26 DIAGNOSIS — M069 Rheumatoid arthritis, unspecified: Secondary | ICD-10-CM

## 2023-03-26 DIAGNOSIS — E119 Type 2 diabetes mellitus without complications: Secondary | ICD-10-CM

## 2023-03-26 DIAGNOSIS — M1990 Unspecified osteoarthritis, unspecified site: Secondary | ICD-10-CM

## 2023-03-26 DIAGNOSIS — F445 Conversion disorder with seizures or convulsions: Secondary | ICD-10-CM

## 2023-03-26 DIAGNOSIS — F3341 Major depressive disorder, recurrent, in partial remission: Secondary | ICD-10-CM

## 2023-03-26 DIAGNOSIS — J309 Allergic rhinitis, unspecified: Secondary | ICD-10-CM

## 2023-03-26 DIAGNOSIS — R911 Solitary pulmonary nodule: Secondary | ICD-10-CM

## 2023-03-26 DIAGNOSIS — G479 Sleep disorder, unspecified: Secondary | ICD-10-CM

## 2023-03-26 DIAGNOSIS — J45909 Unspecified asthma, uncomplicated: Secondary | ICD-10-CM

## 2023-03-26 DIAGNOSIS — M4802 Spinal stenosis, cervical region: Secondary | ICD-10-CM

## 2023-03-26 DIAGNOSIS — Z72820 Sleep deprivation: Secondary | ICD-10-CM

## 2023-03-26 DIAGNOSIS — G259 Extrapyramidal and movement disorder, unspecified: Secondary | ICD-10-CM

## 2023-03-26 DIAGNOSIS — K5790 Diverticulosis of intestine, part unspecified, without perforation or abscess without bleeding: Secondary | ICD-10-CM

## 2023-03-26 DIAGNOSIS — Z79899 Other long term (current) drug therapy: Secondary | ICD-10-CM

## 2023-03-26 DIAGNOSIS — E034 Atrophy of thyroid (acquired): Secondary | ICD-10-CM

## 2023-03-26 DIAGNOSIS — K760 Fatty (change of) liver, not elsewhere classified: Secondary | ICD-10-CM

## 2023-03-26 DIAGNOSIS — R3989 Other symptoms and signs involving the genitourinary system: Secondary | ICD-10-CM

## 2023-03-26 DIAGNOSIS — G629 Polyneuropathy, unspecified: Secondary | ICD-10-CM

## 2023-03-26 DIAGNOSIS — E785 Hyperlipidemia, unspecified: Secondary | ICD-10-CM

## 2023-03-26 DIAGNOSIS — E039 Hypothyroidism, unspecified: Secondary | ICD-10-CM

## 2023-03-26 NOTE — Patient Instructions
Clinic Visit Summary:     Next clinic visit follow up with Dr Hall recommended in 6 months with Pulmonary Function Tests, at any location.     Please contact Pulmonary Nurse Coordinator with signs and symptoms of worsening productive cough with thick secretions, blood in sputum, chest tightness/pain, shortness of breath, fever, chills, night sweats, or any questions or concerns.     ILD/Sarcoidosis Clinical Care Coordinators: Jamie Ludwig, RN [phone], Marilena Trevathan, RN [phone], and Heath Farrar, RN [phone].    For refills on medications, please have your pharmacy fax a refill authorization request form to our office at Fax) [phone]. Please allow at least 3 business days for refill requests.   For urgent issues after business hours/weekends/holidays call [phone] and request for the pulmonary fellow to be paged. For scheduling questions/concerns, please call [phone].

## 2023-03-27 ENCOUNTER — Encounter: Admit: 2023-03-27 | Discharge: 2023-03-27 | Payer: MEDICARE

## 2023-03-27 DIAGNOSIS — M0579 Rheumatoid arthritis with rheumatoid factor of multiple sites without organ or systems involvement: Secondary | ICD-10-CM

## 2023-03-27 DIAGNOSIS — R310 Gross hematuria: Secondary | ICD-10-CM

## 2023-03-27 DIAGNOSIS — M791 Myalgia, unspecified site: Secondary | ICD-10-CM

## 2023-03-27 MED ORDER — INSULIN ASPART 100 UNIT/ML SC FLEXPEN
SUBCUTANEOUS | 1 refills
Start: 2023-03-27 — End: ?

## 2023-03-27 MED ORDER — INSULIN ASPART 100 UNIT/ML SC FLEXPEN
30 [IU] | Freq: Three times a day (TID) | SUBCUTANEOUS | 3 refills | 30.00000 days | Status: AC
Start: 2023-03-27 — End: ?
  Filled 2023-03-31: qty 30, 25d supply, fill #1

## 2023-03-28 ENCOUNTER — Encounter: Admit: 2023-03-28 | Discharge: 2023-03-28 | Payer: MEDICARE

## 2023-03-28 DIAGNOSIS — Z9981 Dependence on supplemental oxygen: Secondary | ICD-10-CM

## 2023-03-28 DIAGNOSIS — M4802 Spinal stenosis, cervical region: Secondary | ICD-10-CM

## 2023-03-28 DIAGNOSIS — R413 Other amnesia: Secondary | ICD-10-CM

## 2023-03-28 DIAGNOSIS — E034 Atrophy of thyroid (acquired): Secondary | ICD-10-CM

## 2023-03-28 DIAGNOSIS — I2510 Atherosclerotic heart disease of native coronary artery without angina pectoris: Secondary | ICD-10-CM

## 2023-03-28 DIAGNOSIS — R4189 Other symptoms and signs involving cognitive functions and awareness: Secondary | ICD-10-CM

## 2023-03-28 DIAGNOSIS — J40 Bronchitis, not specified as acute or chronic: Secondary | ICD-10-CM

## 2023-03-28 DIAGNOSIS — R32 Unspecified urinary incontinence: Secondary | ICD-10-CM

## 2023-03-28 DIAGNOSIS — K5790 Diverticulosis of intestine, part unspecified, without perforation or abscess without bleeding: Secondary | ICD-10-CM

## 2023-03-28 DIAGNOSIS — E7849 Other hyperlipidemia: Secondary | ICD-10-CM

## 2023-03-28 DIAGNOSIS — R54 Age-related physical debility: Secondary | ICD-10-CM

## 2023-03-28 DIAGNOSIS — M1990 Unspecified osteoarthritis, unspecified site: Secondary | ICD-10-CM

## 2023-03-28 DIAGNOSIS — J45909 Unspecified asthma, uncomplicated: Secondary | ICD-10-CM

## 2023-03-28 DIAGNOSIS — E039 Hypothyroidism, unspecified: Secondary | ICD-10-CM

## 2023-03-28 DIAGNOSIS — M069 Rheumatoid arthritis, unspecified: Secondary | ICD-10-CM

## 2023-03-28 DIAGNOSIS — G259 Extrapyramidal and movement disorder, unspecified: Secondary | ICD-10-CM

## 2023-03-28 DIAGNOSIS — E119 Type 2 diabetes mellitus without complications: Secondary | ICD-10-CM

## 2023-03-28 DIAGNOSIS — E785 Hyperlipidemia, unspecified: Secondary | ICD-10-CM

## 2023-03-28 DIAGNOSIS — E669 Obesity, unspecified: Secondary | ICD-10-CM

## 2023-03-28 DIAGNOSIS — R55 Syncope and collapse: Secondary | ICD-10-CM

## 2023-03-28 DIAGNOSIS — H547 Unspecified visual loss: Secondary | ICD-10-CM

## 2023-03-28 DIAGNOSIS — E1165 Type 2 diabetes mellitus with hyperglycemia: Secondary | ICD-10-CM

## 2023-03-28 DIAGNOSIS — C801 Malignant (primary) neoplasm, unspecified: Secondary | ICD-10-CM

## 2023-03-28 DIAGNOSIS — J309 Allergic rhinitis, unspecified: Secondary | ICD-10-CM

## 2023-03-28 DIAGNOSIS — G479 Sleep disorder, unspecified: Secondary | ICD-10-CM

## 2023-03-28 DIAGNOSIS — G629 Polyneuropathy, unspecified: Secondary | ICD-10-CM

## 2023-03-28 DIAGNOSIS — F3341 Major depressive disorder, recurrent, in partial remission: Secondary | ICD-10-CM

## 2023-03-28 DIAGNOSIS — U071 COVID-19 virus infection: Secondary | ICD-10-CM

## 2023-03-28 DIAGNOSIS — R911 Solitary pulmonary nodule: Secondary | ICD-10-CM

## 2023-03-28 DIAGNOSIS — K760 Fatty (change of) liver, not elsewhere classified: Secondary | ICD-10-CM

## 2023-03-28 DIAGNOSIS — M48061 Spinal stenosis, lumbar region without neurogenic claudication: Secondary | ICD-10-CM

## 2023-03-28 DIAGNOSIS — G4733 Obstructive sleep apnea (adult) (pediatric): Secondary | ICD-10-CM

## 2023-03-28 DIAGNOSIS — K219 Gastro-esophageal reflux disease without esophagitis: Secondary | ICD-10-CM

## 2023-03-28 DIAGNOSIS — R3989 Other symptoms and signs involving the genitourinary system: Secondary | ICD-10-CM

## 2023-03-28 DIAGNOSIS — G47419 Narcolepsy without cataplexy: Secondary | ICD-10-CM

## 2023-03-28 DIAGNOSIS — R29898 Other symptoms and signs involving the musculoskeletal system: Secondary | ICD-10-CM

## 2023-03-28 DIAGNOSIS — R1319 Other dysphagia: Secondary | ICD-10-CM

## 2023-03-28 DIAGNOSIS — Z79899 Other long term (current) drug therapy: Secondary | ICD-10-CM

## 2023-03-28 DIAGNOSIS — F445 Conversion disorder with seizures or convulsions: Secondary | ICD-10-CM

## 2023-03-31 ENCOUNTER — Encounter: Admit: 2023-03-31 | Discharge: 2023-03-31 | Payer: MEDICARE

## 2023-04-03 ENCOUNTER — Encounter: Admit: 2023-04-03 | Discharge: 2023-04-03 | Payer: MEDICARE

## 2023-04-14 ENCOUNTER — Encounter: Admit: 2023-04-14 | Discharge: 2023-04-14 | Payer: MEDICARE

## 2023-04-14 MED ORDER — PANTOPRAZOLE 40 MG PO TBEC
40 mg | ORAL_TABLET | Freq: Two times a day (BID) | ORAL | 3 refills
Start: 2023-04-14 — End: ?

## 2023-04-17 ENCOUNTER — Encounter: Admit: 2023-04-17 | Discharge: 2023-04-17 | Payer: MEDICARE

## 2023-04-17 MED ORDER — PANTOPRAZOLE 40 MG PO TBEC
40 mg | ORAL_TABLET | Freq: Two times a day (BID) | ORAL | 3 refills
Start: 2023-04-17 — End: ?

## 2023-04-17 MED FILL — FAMOTIDINE 40 MG PO TAB: 40 mg | ORAL | 90 days supply | Qty: 90 | Fill #2 | Status: AC

## 2023-04-17 MED FILL — FOLIC ACID 1 MG PO TAB: 1 mg | ORAL | 30 days supply | Qty: 120 | Fill #8 | Status: AC

## 2023-04-17 MED FILL — ROSUVASTATIN 20 MG PO TAB: 20 mg | ORAL | 90 days supply | Qty: 90 | Fill #3 | Status: AC

## 2023-04-17 MED FILL — CELECOXIB 200 MG PO CAP: 200 mg | ORAL | 90 days supply | Qty: 180 | Fill #2 | Status: AC

## 2023-04-20 ENCOUNTER — Encounter: Admit: 2023-04-20 | Discharge: 2023-04-20 | Payer: MEDICARE

## 2023-04-20 MED ORDER — PANTOPRAZOLE 40 MG PO TBEC
40 mg | ORAL_TABLET | Freq: Two times a day (BID) | ORAL | 3 refills
Start: 2023-04-20 — End: ?

## 2023-04-22 ENCOUNTER — Encounter: Admit: 2023-04-22 | Discharge: 2023-04-22 | Payer: MEDICARE

## 2023-04-23 ENCOUNTER — Encounter: Admit: 2023-04-23 | Discharge: 2023-04-23 | Payer: MEDICARE

## 2023-05-05 ENCOUNTER — Encounter: Admit: 2023-05-05 | Discharge: 2023-05-05 | Payer: MEDICARE

## 2023-05-06 ENCOUNTER — Encounter: Admit: 2023-05-06 | Discharge: 2023-05-06 | Payer: MEDICARE

## 2023-05-06 MED ORDER — FAMOTIDINE 40 MG PO TAB
40 mg | ORAL_TABLET | Freq: Every day | ORAL | 0 refills | Status: CN
Start: 2023-05-06 — End: ?

## 2023-05-06 MED ORDER — CALCIUM CARBONATE 600 MG CALCIUM (1,500 MG) PO TAB
2 | ORAL_TABLET | Freq: Two times a day (BID) | ORAL | 0 refills | Status: CN
Start: 2023-05-06 — End: ?

## 2023-05-06 MED ORDER — VAZALORE 81 MG PO CAP
81 mg | ORAL_CAPSULE | Freq: Every day | ORAL | 0 refills | Status: CN
Start: 2023-05-06 — End: ?

## 2023-05-06 MED ORDER — SENNOSIDES-DOCUSATE SODIUM 8.6-50 MG PO TAB
1 | ORAL_TABLET | Freq: Every day | ORAL | 0 refills | Status: CN
Start: 2023-05-06 — End: ?

## 2023-05-06 MED ORDER — FOLIC ACID 1 MG PO TAB
4 mg | ORAL_TABLET | Freq: Every day | ORAL | 0 refills | Status: AC
Start: 2023-05-06 — End: ?
  Filled 2023-05-08: qty 120, 30d supply, fill #1

## 2023-05-06 MED ORDER — CHOLECALCIFEROL (VITAMIN D3) 1,250 MCG (50,000 UNIT) PO CAP
50000 [IU] | ORAL_CAPSULE | ORAL | 0 refills | 84.00000 days | Status: AC
Start: 2023-05-06 — End: ?
  Filled 2023-07-14: qty 12, 84d supply, fill #2

## 2023-05-06 MED ORDER — FERRETTS 325 MG (106 MG IRON) PO TAB
106 mg | ORAL_TABLET | Freq: Every day | ORAL | 0 refills | Status: CN
Start: 2023-05-06 — End: ?

## 2023-05-06 MED ORDER — JARDIANCE 10 MG PO TAB
10 mg | ORAL_TABLET | Freq: Every morning | ORAL | 0 refills | Status: AC
Start: 2023-05-06 — End: ?
  Filled 2023-05-08: qty 30, 30d supply, fill #1

## 2023-05-06 MED ORDER — BUMETANIDE 1 MG PO TAB
1 mg | ORAL_TABLET | Freq: Every day | ORAL | 0 refills | Status: AC
Start: 2023-05-06 — End: ?
  Filled 2023-05-08: qty 30, 30d supply, fill #1

## 2023-05-06 MED ORDER — VENLAFAXINE 150 MG PO CP24
150 mg | ORAL_CAPSULE | Freq: Every day | ORAL | 0 refills | Status: AC
Start: 2023-05-06 — End: ?
  Filled 2023-05-06 – 2023-05-08 (×2): qty 30, 30d supply, fill #1

## 2023-05-06 MED ORDER — BACLOFEN 10 MG PO TAB
10 mg | ORAL_TABLET | Freq: Three times a day (TID) | ORAL | 0 refills | Status: AC
Start: 2023-05-06 — End: ?
  Filled 2023-05-08: qty 90, 30d supply, fill #1

## 2023-05-06 MED ORDER — LEVOTHYROXINE 88 MCG PO TAB
88 ug | ORAL_TABLET | Freq: Every day | ORAL | 0 refills | 30.00000 days | Status: AC
Start: 2023-05-06 — End: ?
  Filled 2023-07-07: qty 90, 90d supply, fill #2

## 2023-05-06 MED ORDER — DICLOFENAC SODIUM 1 % TP GEL
4 g | Freq: Three times a day (TID) | TOPICAL | 0 refills | 25.00000 days | Status: AC
Start: 2023-05-06 — End: ?
  Filled 2023-05-06 – 2023-05-08 (×2): qty 100, 9d supply, fill #1

## 2023-05-06 MED ORDER — INSULIN GLARGINE 100 UNIT/ML SC SOLN
30 [IU] | Freq: Two times a day (BID) | SUBCUTANEOUS | 0 refills | 68.00000 days | Status: AC
Start: 2023-05-06 — End: ?
  Filled 2023-05-08: qty 20, 33d supply, fill #1

## 2023-05-07 ENCOUNTER — Encounter: Admit: 2023-05-07 | Discharge: 2023-05-07 | Payer: MEDICARE

## 2023-05-08 ENCOUNTER — Encounter: Admit: 2023-05-08 | Discharge: 2023-05-08 | Payer: MEDICARE

## 2023-05-26 ENCOUNTER — Encounter: Admit: 2023-05-26 | Discharge: 2023-05-26 | Payer: MEDICARE

## 2023-05-28 ENCOUNTER — Encounter: Admit: 2023-05-28 | Discharge: 2023-05-28 | Payer: MEDICARE

## 2023-05-28 ENCOUNTER — Ambulatory Visit: Admit: 2023-05-28 | Discharge: 2023-05-28 | Payer: MEDICARE

## 2023-05-28 DIAGNOSIS — E785 Hyperlipidemia, unspecified: Secondary | ICD-10-CM

## 2023-05-28 DIAGNOSIS — J40 Bronchitis, not specified as acute or chronic: Secondary | ICD-10-CM

## 2023-05-28 DIAGNOSIS — R3989 Other symptoms and signs involving the genitourinary system: Secondary | ICD-10-CM

## 2023-05-28 DIAGNOSIS — R1319 Other dysphagia: Secondary | ICD-10-CM

## 2023-05-28 DIAGNOSIS — M069 Rheumatoid arthritis, unspecified: Secondary | ICD-10-CM

## 2023-05-28 DIAGNOSIS — G4733 Obstructive sleep apnea (adult) (pediatric): Secondary | ICD-10-CM

## 2023-05-28 DIAGNOSIS — J45909 Unspecified asthma, uncomplicated: Secondary | ICD-10-CM

## 2023-05-28 DIAGNOSIS — M48061 Spinal stenosis, lumbar region without neurogenic claudication: Secondary | ICD-10-CM

## 2023-05-28 DIAGNOSIS — G47419 Narcolepsy without cataplexy: Secondary | ICD-10-CM

## 2023-05-28 DIAGNOSIS — M1990 Unspecified osteoarthritis, unspecified site: Secondary | ICD-10-CM

## 2023-05-28 DIAGNOSIS — H547 Unspecified visual loss: Secondary | ICD-10-CM

## 2023-05-28 DIAGNOSIS — G629 Polyneuropathy, unspecified: Secondary | ICD-10-CM

## 2023-05-28 DIAGNOSIS — Z79899 Other long term (current) drug therapy: Secondary | ICD-10-CM

## 2023-05-28 DIAGNOSIS — E782 Mixed hyperlipidemia: Secondary | ICD-10-CM

## 2023-05-28 DIAGNOSIS — E669 Obesity, unspecified: Secondary | ICD-10-CM

## 2023-05-28 DIAGNOSIS — R5383 Other fatigue: Secondary | ICD-10-CM

## 2023-05-28 DIAGNOSIS — E7849 Other hyperlipidemia: Secondary | ICD-10-CM

## 2023-05-28 DIAGNOSIS — R29898 Other symptoms and signs involving the musculoskeletal system: Secondary | ICD-10-CM

## 2023-05-28 DIAGNOSIS — M7918 Myalgia, other site: Secondary | ICD-10-CM

## 2023-05-28 DIAGNOSIS — M0579 Rheumatoid arthritis with rheumatoid factor of multiple sites without organ or systems involvement: Secondary | ICD-10-CM

## 2023-05-28 DIAGNOSIS — J309 Allergic rhinitis, unspecified: Secondary | ICD-10-CM

## 2023-05-28 DIAGNOSIS — E119 Type 2 diabetes mellitus without complications: Secondary | ICD-10-CM

## 2023-05-28 DIAGNOSIS — I2510 Atherosclerotic heart disease of native coronary artery without angina pectoris: Secondary | ICD-10-CM

## 2023-05-28 DIAGNOSIS — K760 Fatty (change of) liver, not elsewhere classified: Secondary | ICD-10-CM

## 2023-05-28 DIAGNOSIS — K5790 Diverticulosis of intestine, part unspecified, without perforation or abscess without bleeding: Secondary | ICD-10-CM

## 2023-05-28 DIAGNOSIS — R55 Syncope and collapse: Secondary | ICD-10-CM

## 2023-05-28 DIAGNOSIS — M4802 Spinal stenosis, cervical region: Secondary | ICD-10-CM

## 2023-05-28 DIAGNOSIS — K219 Gastro-esophageal reflux disease without esophagitis: Secondary | ICD-10-CM

## 2023-05-28 DIAGNOSIS — R4189 Other symptoms and signs involving cognitive functions and awareness: Secondary | ICD-10-CM

## 2023-05-28 DIAGNOSIS — F445 Conversion disorder with seizures or convulsions: Secondary | ICD-10-CM

## 2023-05-28 DIAGNOSIS — E034 Atrophy of thyroid (acquired): Secondary | ICD-10-CM

## 2023-05-28 DIAGNOSIS — E1165 Type 2 diabetes mellitus with hyperglycemia: Secondary | ICD-10-CM

## 2023-05-28 DIAGNOSIS — F3341 Major depressive disorder, recurrent, in partial remission: Secondary | ICD-10-CM

## 2023-05-28 DIAGNOSIS — R911 Solitary pulmonary nodule: Secondary | ICD-10-CM

## 2023-05-28 DIAGNOSIS — R32 Unspecified urinary incontinence: Secondary | ICD-10-CM

## 2023-05-28 DIAGNOSIS — Z9981 Dependence on supplemental oxygen: Secondary | ICD-10-CM

## 2023-05-28 DIAGNOSIS — C801 Malignant (primary) neoplasm, unspecified: Secondary | ICD-10-CM

## 2023-05-28 DIAGNOSIS — R413 Other amnesia: Secondary | ICD-10-CM

## 2023-05-28 DIAGNOSIS — G259 Extrapyramidal and movement disorder, unspecified: Secondary | ICD-10-CM

## 2023-05-28 DIAGNOSIS — E039 Hypothyroidism, unspecified: Secondary | ICD-10-CM

## 2023-05-28 DIAGNOSIS — U071 COVID-19 virus infection: Secondary | ICD-10-CM

## 2023-05-28 DIAGNOSIS — G479 Sleep disorder, unspecified: Secondary | ICD-10-CM

## 2023-05-28 DIAGNOSIS — R54 Age-related physical debility: Secondary | ICD-10-CM

## 2023-05-28 LAB — LIVER FUNCTION PANEL
ALK PHOSPHATASE: 102 U/L (ref 25–110)
ALT: 26 U/L (ref 7–56)
AST: 29 U/L (ref 7–40)
TOTAL BILIRUBIN: 0.4 mg/dL (ref >40)

## 2023-05-28 LAB — CBC AND DIFF
ABSOLUTE BASO COUNT: 0 K/UL (ref 0–0.20)
ABSOLUTE EOS COUNT: 0.2 K/UL (ref 0–0.45)
ABSOLUTE LYMPH COUNT: 1.7 K/UL (ref 1.0–4.8)
ABSOLUTE MONO COUNT: 0.7 K/UL (ref 0–0.80)
ABSOLUTE NEUTROPHIL: 3.5 K/UL (ref 1.8–7.0)
MCH: 29 pg (ref 26–34)
MCV: 87 FL (ref 80–100)
RBC COUNT: 4.4 M/UL (ref 4.0–5.0)
WBC COUNT: 6.3 K/UL (ref 4.5–11.0)

## 2023-05-28 LAB — VITAMIN B12: VITAMIN B12: 526 pg/mL (ref 180–914)

## 2023-05-28 LAB — CREATININE
CREATININE: 0.7 mg/dL (ref 0.4–1.00)
EGFR: >60 mL/min (ref >60)

## 2023-05-28 LAB — LIPID PROFILE
CHOLESTEROL: 157 mg/dL (ref <200)
TRIGLYCERIDES: 158 mg/dL — ABNORMAL HIGH (ref <150)

## 2023-05-28 LAB — TSH WITH FREE T4 REFLEX: TSH: 1.5 uU/mL (ref 0.35–5.00)

## 2023-05-28 MED ORDER — RXAMB NALTREXONE 1.5 MG ORAL CAPSULE (BATCHED COMPOUND)
1.5 mg | ORAL_CAPSULE | Freq: Every day | ORAL | 2 refills | Status: AC
Start: 2023-05-28 — End: ?
  Filled 2023-06-16: qty 0.05, 30d supply, fill #1

## 2023-05-28 MED ORDER — METHOTREXATE SODIUM 2.5 MG PO TAB
20 mg | ORAL_TABLET | ORAL | 3 refills | Status: AC
Start: 2023-05-28 — End: ?
  Filled 2023-05-30: qty 32, 28d supply, fill #1

## 2023-05-28 MED FILL — SPIRONOLACTONE 25 MG PO TAB: 25 mg | ORAL | 90 days supply | Qty: 90 | Fill #2 | Status: CP

## 2023-05-28 MED FILL — GEMTESA 75 MG PO TAB: 75 mg | ORAL | 30 days supply | Qty: 30 | Fill #2 | Status: CP

## 2023-05-28 MED FILL — INSULIN ASPART 100 UNIT/ML SC FLEXPEN: 100 unit/mL (3 mL) | SUBCUTANEOUS | 25 days supply | Qty: 30 | Fill #2 | Status: CP

## 2023-05-29 ENCOUNTER — Encounter: Admit: 2023-05-29 | Discharge: 2023-05-29 | Payer: MEDICARE

## 2023-05-30 ENCOUNTER — Encounter: Admit: 2023-05-30 | Discharge: 2023-05-30 | Payer: MEDICARE

## 2023-06-02 ENCOUNTER — Encounter: Admit: 2023-06-02 | Discharge: 2023-06-02 | Payer: MEDICARE

## 2023-06-03 ENCOUNTER — Encounter: Admit: 2023-06-03 | Discharge: 2023-06-03 | Payer: MEDICARE

## 2023-06-04 ENCOUNTER — Encounter: Admit: 2023-06-04 | Discharge: 2023-06-04 | Payer: MEDICARE

## 2023-06-04 MED ORDER — MUPIROCIN 2 % TP OINT
TOPICAL | 6 refills | 11.00000 days | Status: AC
Start: 2023-06-04 — End: ?
  Filled 2023-06-12: qty 22, 22d supply, fill #1

## 2023-06-10 ENCOUNTER — Encounter: Admit: 2023-06-10 | Discharge: 2023-06-10 | Payer: MEDICARE

## 2023-06-10 ENCOUNTER — Inpatient Hospital Stay: Admit: 2023-06-10 | Payer: MEDICARE

## 2023-06-10 DIAGNOSIS — M069 Rheumatoid arthritis, unspecified: Secondary | ICD-10-CM

## 2023-06-10 LAB — URINALYSIS MICROSCOPIC REFLEX TO CULTURE

## 2023-06-10 LAB — POC GLUCOSE
POC GLUCOSE: 59 mg/dL — ABNORMAL LOW (ref 70–100)
POC GLUCOSE: 167 mg/dL — ABNORMAL HIGH (ref 70–100)
POC GLUCOSE: 96 mg/dL (ref 70–100)
POC GLUCOSE: 137 mg/dL — ABNORMAL HIGH (ref 70–100)

## 2023-06-10 LAB — TSH WITH FREE T4 REFLEX: TSH: 1.5 uU/mL (ref 0.35–5.00)

## 2023-06-10 LAB — URINALYSIS DIPSTICK REFLEX TO CULTURE
LEUKOCYTES: NEGATIVE
NITRITE: NEGATIVE
URINE ASCORBIC ACID, UA: NEGATIVE
URINE BLOOD: NEGATIVE
URINE SPEC GRAVITY: 1 (ref 1.005–1.030)

## 2023-06-10 LAB — COMPREHENSIVE METABOLIC PANEL
ALBUMIN: 4.1 g/dL (ref 3.5–5.0)
ALK PHOSPHATASE: 109 U/L (ref 25–110)
ALT: 24 U/L (ref 7–56)
ANION GAP: 9 K/UL (ref 3–12)
AST: 28 U/L (ref 7–40)
BLD UREA NITROGEN: 31 mg/dL — ABNORMAL HIGH (ref 7–25)
CALCIUM: 9.9 mg/dL (ref 8.5–10.6)
CHLORIDE: 101 MMOL/L (ref 98–110)
CO2: 25 MMOL/L (ref 21–30)
CREATININE: 0.7 mg/dL (ref 0.4–1.00)
EGFR: >60 mL/min (ref >60)
GLUCOSE,PANEL: 278 mg/dL — ABNORMAL HIGH (ref 70–100)
POTASSIUM: 3.9 MMOL/L (ref 3.5–5.1)
SODIUM: 135 MMOL/L — ABNORMAL LOW (ref 137–147)
TOTAL BILIRUBIN: 0.5 mg/dL (ref 0.2–1.3)
TOTAL PROTEIN: 6.9 g/dL (ref 6.0–8.0)

## 2023-06-10 LAB — RVP VIRAL PANEL PCR

## 2023-06-10 LAB — CBC AND DIFF
ABSOLUTE BASO COUNT: 0 K/UL (ref 0–0.20)
ABSOLUTE EOS COUNT: 0.2 K/UL (ref 0–0.45)
ABSOLUTE MONO COUNT: 0.5 K/UL (ref 0–0.80)
MDW (MONOCYTE DISTRIBUTION WIDTH): 17 (ref <20.7)
WBC COUNT: 5.1 K/UL (ref 4.5–11.0)

## 2023-06-10 LAB — MAGNESIUM: MAGNESIUM: 2.1 mg/dL (ref 1.6–2.6)

## 2023-06-10 LAB — SED RATE: ESR: 28 mm/h (ref 0–30)

## 2023-06-10 LAB — PHOSPHORUS: PHOSPHORUS: 4.6 mg/dL — ABNORMAL HIGH (ref 2.0–4.5)

## 2023-06-10 LAB — C REACTIVE PROTEIN (CRP): C-REACTIVE PROTEIN: 0.2 mg/dL (ref <1.0)

## 2023-06-10 MED ORDER — ROSUVASTATIN 20 MG PO TAB
20 mg | Freq: Every day | ORAL | 0 refills | Status: AC
Start: 2023-06-10 — End: ?
  Administered 2023-06-11 – 2023-06-13 (×3): 20 mg via ORAL

## 2023-06-10 MED ORDER — SENNOSIDES-DOCUSATE SODIUM 8.6-50 MG PO TAB
1 | Freq: Every day | ORAL | 0 refills | Status: DC
Start: 2023-06-10 — End: 2023-06-11

## 2023-06-10 MED ORDER — LACTATED RINGERS IV SOLP
1000 mL | Freq: Once | INTRAVENOUS | 0 refills | Status: CP
Start: 2023-06-10 — End: ?
  Administered 2023-06-10: 20:00:00 1000 mL via INTRAVENOUS

## 2023-06-10 MED ORDER — ONDANSETRON HCL (PF) 4 MG/2 ML IJ SOLN
4 mg | INTRAVENOUS | 0 refills | Status: AC | PRN
Start: 2023-06-10 — End: ?

## 2023-06-10 MED ORDER — ONDANSETRON 4 MG PO TBDI
4 mg | ORAL | 0 refills | Status: AC | PRN
Start: 2023-06-10 — End: ?

## 2023-06-10 MED ORDER — PANTOPRAZOLE 40 MG PO TBEC
40 mg | Freq: Two times a day (BID) | ORAL | 0 refills | Status: AC
Start: 2023-06-10 — End: ?
  Administered 2023-06-11 – 2023-06-13 (×6): 40 mg via ORAL

## 2023-06-10 MED ORDER — SPIRONOLACTONE 25 MG PO TAB
25 mg | Freq: Every day | ORAL | 0 refills | Status: AC
Start: 2023-06-10 — End: ?
  Administered 2023-06-11 – 2023-06-13 (×4): 25 mg via ORAL

## 2023-06-10 MED ORDER — ACETAMINOPHEN 500 MG PO TAB
1000 mg | ORAL | 0 refills | Status: AC | PRN
Start: 2023-06-10 — End: ?
  Administered 2023-06-11: 04:00:00 1000 mg via ORAL

## 2023-06-10 MED ORDER — BACLOFEN 10 MG PO TAB
10 mg | Freq: Three times a day (TID) | ORAL | 0 refills | Status: AC
Start: 2023-06-10 — End: ?
  Administered 2023-06-11 – 2023-06-12 (×3): 10 mg via ORAL

## 2023-06-10 MED ORDER — DICLOFENAC SODIUM 1 % TP GEL
4 g | Freq: Three times a day (TID) | TOPICAL | 0 refills | Status: AC
Start: 2023-06-10 — End: ?
  Administered 2023-06-11: 04:00:00 4 g via TOPICAL

## 2023-06-10 MED ORDER — VENLAFAXINE 150 MG PO CP24
150 mg | Freq: Every day | ORAL | 0 refills | Status: DC
Start: 2023-06-10 — End: 2023-06-11

## 2023-06-10 MED ORDER — POLYETHYLENE GLYCOL 3350 17 GRAM PO PWPK
17 g | Freq: Two times a day (BID) | ORAL | 0 refills | Status: DC | PRN
Start: 2023-06-10 — End: 2023-06-11

## 2023-06-10 MED ORDER — MELATONIN 5 MG PO TAB
5 mg | Freq: Every evening | ORAL | 0 refills | Status: AC | PRN
Start: 2023-06-10 — End: ?
  Administered 2023-06-13: 02:00:00 5 mg via ORAL

## 2023-06-10 MED ORDER — BUMETANIDE 1 MG PO TAB
1 mg | Freq: Every day | ORAL | 0 refills | Status: AC
Start: 2023-06-10 — End: ?
  Administered 2023-06-11 – 2023-06-13 (×3): 1 mg via ORAL

## 2023-06-10 MED ORDER — FOLIC ACID 1 MG PO TAB
4 mg | Freq: Every day | ORAL | 0 refills | Status: AC
Start: 2023-06-10 — End: ?
  Administered 2023-06-11 – 2023-06-13 (×3): 4 mg via ORAL

## 2023-06-10 MED ORDER — SENNOSIDES-DOCUSATE SODIUM 8.6-50 MG PO TAB
1 | Freq: Every day | ORAL | 0 refills | Status: AC | PRN
Start: 2023-06-10 — End: ?

## 2023-06-10 MED ORDER — ACETAMINOPHEN 325 MG PO TAB
650 mg | Freq: Once | ORAL | 0 refills | Status: CP
Start: 2023-06-10 — End: ?
  Administered 2023-06-10: 22:00:00 650 mg via ORAL

## 2023-06-10 MED ORDER — LEVOTHYROXINE 88 MCG PO TAB
88 ug | Freq: Every morning | ORAL | 0 refills | Status: AC
Start: 2023-06-10 — End: ?
  Administered 2023-06-11 – 2023-06-13 (×3): 88 ug via ORAL

## 2023-06-10 MED ORDER — INSULIN GLARGINE 100 UNIT/ML (3 ML) SC INJ PEN
40 [IU] | Freq: Two times a day (BID) | SUBCUTANEOUS | 0 refills | Status: AC
Start: 2023-06-10 — End: ?
  Administered 2023-06-11: 04:00:00 40 [IU] via SUBCUTANEOUS

## 2023-06-10 MED ORDER — ENOXAPARIN 40 MG/0.4 ML SC SYRG
40 mg | Freq: Every day | SUBCUTANEOUS | 0 refills | Status: AC
Start: 2023-06-10 — End: ?
  Administered 2023-06-11 – 2023-06-13 (×3): 40 mg via SUBCUTANEOUS

## 2023-06-10 MED ORDER — KETOROLAC 15 MG/ML IJ SOLN
15 mg | INTRAVENOUS | 0 refills | Status: AC
Start: 2023-06-10 — End: ?
  Administered 2023-06-11 (×3): 15 mg via INTRAVENOUS

## 2023-06-10 MED ORDER — ASPIRIN 81 MG PO TBEC
81 mg | Freq: Every day | ORAL | 0 refills | Status: AC
Start: 2023-06-10 — End: ?
  Administered 2023-06-11 – 2023-06-13 (×4): 81 mg via ORAL

## 2023-06-10 MED ORDER — PRAMIPEXOLE 0.25 MG PO TAB
.75 mg | Freq: Every evening | ORAL | 0 refills | Status: AC
Start: 2023-06-10 — End: ?
  Administered 2023-06-11 – 2023-06-13 (×3): 0.75 mg via ORAL

## 2023-06-10 MED ORDER — INSULIN ASPART 100 UNIT/ML SC FLEXPEN
30 [IU] | Freq: Three times a day (TID) | SUBCUTANEOUS | 0 refills | Status: AC
Start: 2023-06-10 — End: ?

## 2023-06-10 MED ORDER — MIRABEGRON 25 MG PO TB24
25 mg | Freq: Every day | ORAL | 0 refills | Status: AC
Start: 2023-06-10 — End: ?
  Administered 2023-06-11 – 2023-06-13 (×3): 25 mg via ORAL

## 2023-06-10 MED ORDER — VENLAFAXINE 150 MG PO CP24
150 mg | Freq: Every day | ORAL | 0 refills | Status: AC
Start: 2023-06-10 — End: ?
  Administered 2023-06-11 – 2023-06-13 (×3): 150 mg via ORAL

## 2023-06-10 MED ORDER — POLYETHYLENE GLYCOL 3350 17 GRAM PO PWPK
1 | Freq: Every day | ORAL | 0 refills | Status: AC | PRN
Start: 2023-06-10 — End: ?

## 2023-06-10 NOTE — ED Notes
Provided pt with grape juice and a lunch box per Dr. Anselm Pancoast for pts glucose

## 2023-06-10 NOTE — H&P (View-Only)
Admission History and Physical Examination      Name: Hailey Rodgers   MRN: [id number]     DOB: [birth year]      Age: 72 y.o.  Admission Date: 06/10/2023     LOS: 0 days     Date of Service: 06/10/2023                        Assessment/Plan:    Active Problems:    * No active hospital problems. *      72 y.o.   female with past medical history significant for rheumatoid arthritis, hypertension, hyperlipidemia, DM type II, hypothyroidism, chronic HFpEF, OSA, depression, GERD, restless leg syndrome who presents to the emergency department with complaints of generalized swelling and body aches.     Suspected RA flare  -PTA she is on methotrexate 20 mg every 7 days  -Follows up with rheumatology as outpatient  -ESR and crp are not elevated  Plan  -Consult rheumatology for further recommendations  -We will hold off on starting steroids for now  -Pain management with Toradol and PRN fentanyl      DM type II  -PTA on Trulicity, NovoLog 30 units 3 times daily with meals and sliding scale and Jardiance  -last A1c is 9.1 from January  24   -Hold Jardiance for now  -Lantus 40 Units BID  -Continue PTA NovoLog 30 units 3 times daily with meals and will add medium dose correction factor     Chronic HFpEF  -Patient currently appears euvolemic  -Echo from 06/21/2022 revealed EF of 60%  -Continue PTA Lasix 40 mg daily and spironolactone 25 mg daily  -Daily weight and I&O     OSA  -CPAP at night     Hyperlipidemia  -Continue PTA Crestor     Hypertension  -Continue PTA Lasix and spironolactone     Hypothyroidism  -Continue PTA Synthroid      Depression  -Continue PTA Effexor      GERD  -Continue PTA Protonix 40 mg twice daily     Restless leg syndrome  -Continue PTA Mirapex         _____________________________________________________________________________    Primary Care Physician: Lona Kettle  Verified    Chief Complaint:  Generalized body aches  History of Present Illness: Hailey Rodgers is a 72 y.o. female with past medical history significant for rheumatoid arthritis, hypertension, hyperlipidemia, DM type II, hypothyroidism, chronic HFpEF, OSA, depression, GERD, restless leg syndrome who presents to the emergency department with complaints of generalized swelling and body aches.  Sates this started 4 days ago.  Usually she can ambulate at home wit a cain but now having difficulty.  Denies fevers, chills, chest pain, shortness of air or sick contacts.  Appetite is normal and having normal BMs for her.    Past Medical History:   Diagnosis Date    Age-related physical debility 10/06/2020    Allergic rhinitis     Arthritis     Asthma     Bronchitis     Cancer (HCC) Skin    Cervical stenosis of spinal canal     Coronary artery disease     COVID-19 virus infection 10/06/2020    06/2020    Disorganized thinking     Diverticulosis 10/06/2020    DM (diabetes mellitus) (HCC)     Dyslipidemia     Fatty liver disease, nonalcoholic     Gastroesophageal reflux disease without esophagitis 10/06/2020  GERD (gastroesophageal reflux disease)     Hypothyroidism     Hypothyroidism due to acquired atrophy of thyroid 10/06/2020    Leg weakness     Lumbar stenosis     Lung nodule 10/06/2020    10/06/2020 5.  Right upper lobe groundglass nodule measuring 5 mm which may be  infectious or inflammatory. Multiple additional sub-5 mm nodules  bilaterally, most lymph nodes or granulomas. Follow-up CT in 12 months  recommended for further evaluation if clinically indicated.     Memory loss     Movement disorder     Narcolepsy     Obesity     On supplemental oxygen therapy 2023    Only at night - No longer use on Bi-Pap now    OSA (obstructive sleep apnea)     Other dysphagia     Other hyperlipidemia 10/06/2020    Peripheral neuropathy     Polypharmacy 01/04/2021    Pseudoseizures     Recurrent major depressive disorder, in partial remission (HCC) 10/06/2020    Rheumatoid arthritis (HCC) 10/06/2020    Sleep disorder Sleep Apnea & Narcolepsy    Syncope and collapse     Age 24 years old    Type 2 diabetes mellitus with hyperglycemia, with long-term current use of insulin (HCC) 10/06/2020    Urinary incontinence     Urinary problem     urinary frequency    Vision decreased     diplopia     Surgical History:   Procedure Laterality Date    HX ADENOIDECTOMY  1962    HX TONSILLECTOMY  1962    TUBAL LIGATION  1977    RECTAL SURGERY  1979    for rectal prolapse; bladder repair    CHOLECYSTECTOMY  1984    HYSTERECTOMY  2000    BREAST BIOPSY Right 2012    COLONOSCOPY  2016    CERVICAL 3 TO THORACIC 7 DECOMPRESSION AND FUSION Bilateral 07/18/2015    Performed by Storm Frisk, MD at [facility]    BLEPHAROPLASTY      CERVIX SURGERY  2016    HERNIA REPAIR  1984/2007    SEPTOPLASTY  1986/2013    UPPER GASTROINTESTINAL ENDOSCOPY       Family history reviewed; non-contributory  Social History     Tobacco Use    Smoking status: Never    Smokeless tobacco: Never   Vaping Use    Vaping status: Never Used   Substance and Sexual Activity    Alcohol use: Never    Drug use: Never    Sexual activity: Not Currently     Partners: Male     Birth control/protection: Post-menopausal, None             Immunizations (includes history and patient reported):   Immunization History   Administered Date(s) Administered    COVID-19 (MODERNA), mRNA vacc, 100 mcg/0.5 mL (PF) 10/07/2020    Flu Vaccine =>65 YO High-Dose Quadrivalent (PF) 10/25/2020           Allergies:  Amoxicillin-pot clavulanate, Gemfibrozil, Morphine, Trazodone, Adhesive tape (rosins), Atorvastatin, Latex, Bupropion, Cymbalta [duloxetine], and Levaquin [levofloxacin]    Medications:  No current facility-administered medications for this encounter.     Current Outpatient Medications   Medication Sig    acetaminophen (TYLENOL EXTRA STRENGTH) 500 mg tablet Take two tablets by mouth every 6 hours as needed. Max of 4,000 mg of acetaminophen in 24 hours.  Indications: pain    aspirin (VAZALORE) 81 mg  cap Take 81 mg by mouth daily.    aspirin 81 mg chewable tablet Chew one tablet by mouth at bedtime daily.    baclofen (LIORESAL) 10 mg tablet Take one tablet by mouth three times daily.    blood-glucose sensor (DEXCOM G6 SENSOR MISC) Use  as directed.    bumetanide (BUMEX) 1 mg tablet Take one tablet by mouth daily.    calcium carbonate (CALTRATE 600) 600 mg calcium (1,500 mg) tablet Take two tablets by mouth twice daily.    calcium carbonate (CALTRATE) 600 mg calcium (1,500 mg) tablet Take two tablets by mouth twice daily.    CHOLEcalciferoL (vitamin D3) (OPTIMAL D3) 50,000 units capsule Take 1 capsule by mouth once weekly    cyclobenzaprine (FLEXERIL) 5 mg tablet Take one tablet by mouth three times daily as needed for muscle spasms.    diclofenac sodium (ARTHRITIS PAIN (DICLOFENAC)) 1 % topical gel Apply four g topically to affected area three times daily.    docusate (COLACE) 100 mg capsule Take two capsules by mouth twice daily.    famotidine (PEPCID) 40 mg tablet Take one tablet by mouth daily.    famotidine (PEPCID) 40 mg tablet Take one tablet by mouth at bedtime daily.    Ferrous Fumarate (FERRETTS) 325 mg (106 mg iron) tab Take one tablet by mouth daily.    ferrous sulfate (FEOSOL) 325 mg (65 mg iron) tablet Take one tablet by mouth twice daily. Take on an empty stomach at least 1 hour before or 2 hours after food.    fexofenadine(+) (ALLEGRA) 180 mg tablet Take one tablet by mouth daily.    fluocinolone acetonide oil (DERMOTIC OIL) 0.01 % otic drops INSTILL 3 DROPS INTO NOSE DAILY AS NEEDED    folic acid (FOLVITE) 1 mg tablet Take four tablets by mouth daily.    folic acid (FOLVITE) 1 mg tablet Take four tablets by mouth daily.    furosemide (LASIX) 40 mg tablet Take one tablet by mouth every morning. Take 1 tablet (40 mg) by mouth in the morning    insulin aspart (U-100) (NOVOLOG FLEXPEN U-100 INSULIN) 100 unit/mL (3 mL) PEN Inject thirty Units under the skin three times daily with meals with a sliding scale 201-250 = +3 units, 251-300 = +6 units, greater than 300 = +9 units, Total Daily units : 117 units    insulin glargine(+) (LANTUS U-100 INSULIN) 100 unit/mL subcutaneous solution Inject thirty Units under the skin twice daily.    insulin lispro & lispro protamin (Hum) (HUMALOG MIX 75-25 KWIKPEN) 100 unit/mL (75-25) insulin pen Humalog Mix 75-25 KwikPen U-100 insulin 100 unit/mL subcutaneous pen Inject 90 units with breakfast and 60 units with dinner    Insulin Needles (Disposable) (NANO PEN NEEDLE) 32 gauge x 5/32 ndle Use 1 Each as directed before meals and at bedtime.    JARDIANCE 25 mg tablet Take one tablet by mouth daily.    lactobacillus rhamnosus GG (CULTURELLE) 15 billion cell capsule Take one capsule by mouth as directed daily.    LANTUS SOLOSTAR U-100 INSULIN 100 unit/mL (3 mL) subcutaneous PEN INJECT 40 UNITS SUB-Q TWICE DAILY    levothyroxine (SYNTHROID) 88 mcg tablet Take one tablet by mouth daily.    levothyroxine (SYNTHROID) 88 mcg tablet Take one tablet by mouth every morning.    methotrexate sodium 2.5 mg tablet Take eight tablets by mouth every 7 days.    mupirocin (BACTROBAN) 2 % topical ointment Apply 1 application in the nostrils twice a day  naltrexone 1.5 mg oral capsule (BATCHED COMPOUND) Take one capsule by mouth daily.    nitroglycerin (NITROSTAT) 0.4 mg tablet Place one tablet under tongue every 5 minutes as needed for Chest Pain. Max 3 tablets, call 911.    nystatin (MYCOSTATIN) 100,000 unit/g topical cream Apply  topically to affected area twice daily as needed.    ondansetron (ZOFRAN ODT) 8 mg rapid dissolve tablet Dissolve one tablet by mouth every 8 hours as needed.    pantoprazole DR (PROTONIX) 40 mg tablet Take one tablet by mouth twice daily.    polyethylene glycol 3350 (MIRALAX) 17 g packet Take one packet by mouth twice daily as needed.    pramipexole (MIRAPEX) 0.75 mg tablet Take one tablet by mouth three times daily.    rosuvastatin (CRESTOR) 20 mg tablet Take one tablet by mouth daily.    sennosides-docusate sodium (SENNA-S) 8.6/50 mg tablet Take one tablet by mouth daily.    spironolactone (ALDACTONE) 25 mg tablet Take one tablet by mouth daily. Take with food.    triamcinolone acetonide 0.5 % ointment Apply  topically to affected area three times daily as needed.    TRULICITY 3 mg/0.5 mL injection pen     venlafaxine XR (EFFEXOR XR) 150 mg capsule Take one capsule by mouth daily.    venlafaxine XR (EFFEXOR XR) 150 mg capsule Take one capsule by mouth daily.    vibegron (GEMTESA) 75 mg tablet Take one tablet by mouth at bedtime daily.    vitamins, multi w/minerals 9 mg iron-400 mcg tab Take one tablet by mouth daily.             Physical Exam:  Vital Signs: Last Filed In 24 Hours Vital Signs: 24 Hour Range   BP: 125/67 (06/18 1530)  Temp: 36.2 ?C (97.1 ?F) (06/18 1240)  Pulse: 81 (06/18 1530)  Respirations: 18 PER MINUTE (06/18 1240)  SpO2: 95 % (06/18 1530) BP: (118-132)/(67-89)   Temp:  [36.2 ?C (97.1 ?F)]   Pulse:  [81-96]   Respirations:  [18 PER MINUTE]   SpO2:  [94 %-100 %]    Intensity Pain Scale (Self Report): 7 (06/10/23 1241)      General:  Alert, cooperative, no distress, appears stated age  Head:  Normocephalic, without obvious abnormality, atraumatic  Lungs:  Clear to auscultation bilaterally  Heart:    Regular rate and rhythm, S1, S2 normal, no murmur, click rub or gallop  Abdomen:  Soft, non-tender.  Bowel sounds normal.  No masses.  No organomegaly.  Extremities: Hands and feet with swelling around digits and + 1 pitting edema in Lower extremities   Peripheral pulses:   2+ and symmetric, all extremities  Skin:   Skin color, texture, turgor normal.  No rashes or lesions        Lab/Radiology/Other Diagnostic Tests:  24-hour labs:    Results for orders placed or performed during the hospital encounter of 06/10/23 (from the past 24 hour(s))   CBC AND DIFF    Collection Time: 06/10/23  1:04 PM   Result Value Ref Range    White Blood Cells 5.1 4.5 - 11.0 K/UL RBC 4.24 4.0 - 5.0 M/UL    Hemoglobin 12.4 12.0 - 15.0 GM/DL    Hematocrit 16.1 36 - 45 %    MCV 87.7 80 - 100 FL    MCH 29.2 26 - 34 PG    MCHC 33.3 32.0 - 36.0 G/DL    RDW 09.6 11 - 15 %  Platelet Count 266 150 - 400 K/UL    MPV 8.2 7 - 11 FL    Neutrophils 54 41 - 77 %    Lymphocytes 30 24 - 44 %    Monocytes 10 4 - 12 %    Eosinophils 5 0 - 5 %    Basophils 1 0 - 2 %    Absolute Neutrophil Count 2.77 1.8 - 7.0 K/UL    Absolute Lymph Count 1.55 1.0 - 4.8 K/UL    Absolute Monocyte Count 0.51 0 - 0.80 K/UL    Absolute Eosinophil Count 0.26 0 - 0.45 K/UL    Absolute Basophil Count 0.05 0 - 0.20 K/UL    MDW (Monocyte Distribution Width) 17.1 <20.7   COMPREHENSIVE METABOLIC PANEL    Collection Time: 06/10/23  1:04 PM   Result Value Ref Range    Sodium 135 (L) 137 - 147 MMOL/L    Potassium 3.9 3.5 - 5.1 MMOL/L    Chloride 101 98 - 110 MMOL/L    Glucose 278 (H) 70 - 100 MG/DL    Blood Urea Nitrogen 31 (H) 7 - 25 MG/DL    Creatinine 1.61 0.4 - 1.00 MG/DL    Calcium 9.9 8.5 - 09.6 MG/DL    Total Protein 6.9 6.0 - 8.0 G/DL    Total Bilirubin 0.5 0.2 - 1.3 MG/DL    Albumin 4.1 3.5 - 5.0 G/DL    Alk Phosphatase 045 25 - 110 U/L    AST (SGOT) 28 7 - 40 U/L    CO2 25 21 - 30 MMOL/L    ALT (SGPT) 24 7 - 56 U/L    Anion Gap 9 3 - 12    eGFR >60 >60 mL/min   C REACTIVE PROTEIN (CRP)    Collection Time: 06/10/23  1:04 PM   Result Value Ref Range    C-Reactive Protein 0.21 <1.0 MG/DL   SED RATE    Collection Time: 06/10/23  1:04 PM   Result Value Ref Range    Sed Rate -ESR 28 0 - 30 MM/HR   MAGNESIUM    Collection Time: 06/10/23  1:04 PM   Result Value Ref Range    Magnesium 2.1 1.6 - 2.6 mg/dL   PHOSPHORUS    Collection Time: 06/10/23  1:04 PM   Result Value Ref Range    Phosphorus 4.6 (H) 2.0 - 4.5 MG/DL   TSH WITH FREE T4 REFLEX    Collection Time: 06/10/23  1:04 PM   Result Value Ref Range    TSH 1.54 0.35 - 5.00 MCU/ML   RVP VIRAL PANEL PCR    Collection Time: 06/10/23  2:58 PM   Result Value Ref Range    Specimen Source Resp Panel       FLOCKED SWAB  NASOPHARYNGEAL  Use of this test on specimens other than nasopharyngeal swabs has not been   cleared by the [place] Food and Drug Administration. The performance characteristics   were determined by the [facility] of [facility].      Adenovirus NOT DETECTED DN-NOT DETECTED    Coronavirus 229E NOT DETECTED DN-NOT DETECTED    Coronavirus HKU1 NOT DETECTED DN-NOT DETECTED    Coronavirus NL63 NOT DETECTED DN-NOT DETECTED    Coronavirus OC43 NOT DETECTED DN-NOT DETECTED    SARS-CoV-2 NOT DETECTED DN-NOT DETECTED    Human Metapneumovirus NOT DETECTED DN-NOT DETECTED    Human Rhinovirus/ENTEROVIRUS NOT DETECTED DN-NOT DETECTED    Influenza A H1N1 2009 NOT DETECTED  DN-NOT DETECTED    Influenza A H1 NOT DETECTED DN-NOT DETECTED    Influenza A H3 NOT DETECTED DN-NOT DETECTED    Influenza B NOT DETECTED DN-NOT DETECTED    Parainfluenza virus 1 NOT DETECTED DN-NOT DETECTED    Parainfluenza virus 2 NOT DETECTED DN-NOT DETECTED    Parainfluenza virus 3 NOT DETECTED DN-NOT DETECTED    Parainfluenza virus 4 NOT DETECTED DN-NOT DETECTED    RSV NOT DETECTED DN-NOT DETECTED    Bordetella parapertussis NOT DETECTED DN-NOT DETECTED    Bordetella pertussis NOT DETECTED DN-NOT DETECTED    Chlamydia pneumoniae NOT DETECTED DN-NOT DETECTED    Mycoplasma pneumoniae NOT DETECTED DN-NOT DETECTED     Glucose: (!) 278 (06/10/23 1304)  Pertinent radiology reviewed.    Orley Lawry, DO  Pager [phone]

## 2023-06-10 NOTE — ED Notes
Pt is a 72 yo female who came into the ED today with c/o joint pain. Patient reports this began a couple days ago. Pt complaining of severe generalized joint pain. Associated symptoms include overall weakness, fatigue, and chills. Pt also reports intermittent muscle spasms. Pt reports eating and drinking appropriately as well as taking medications as scheduled.  Pt denies CP, SOB, N/V/D, constipation, abdominal pain, and/or fevers. Pt is alert and oriented, breathing is non-labored, skin is appropriate to age and ethnicity. Pt resting in bed, locked in lowest position, side rails up, call light in reach.    Belongings: shirt, pants, shoes, purse

## 2023-06-10 NOTE — ED Notes
ED Initial Provider Note:    This patient was seen in the ED triage area to initiate and expedite the patients ED care when possible.    ED Chief Complaint:   Chief Complaint   Patient presents with    Generalized Joint/Body Pain     Hx of RA. Reports gen body and joint pain that is all over. Difficulty walking       S: Hailey Rodgers is a 72 y.o. female who presents to the Emergency Department for generalized join and body pain. Hx of RA and states feels like a flare up. Denies injury.    PMHx:  Past Medical History:   Diagnosis Date    Age-related physical debility 10/06/2020    Allergic rhinitis     Arthritis     Asthma     Bronchitis     Cancer (HCC) Skin    Cervical stenosis of spinal canal     Coronary artery disease     COVID-19 virus infection 10/06/2020    06/2020    Disorganized thinking     Diverticulosis 10/06/2020    DM (diabetes mellitus) (HCC)     Dyslipidemia     Fatty liver disease, nonalcoholic     Gastroesophageal reflux disease without esophagitis 10/06/2020    GERD (gastroesophageal reflux disease)     Hypothyroidism     Hypothyroidism due to acquired atrophy of thyroid 10/06/2020    Leg weakness     Lumbar stenosis     Lung nodule 10/06/2020    10/06/2020 5.  Right upper lobe groundglass nodule measuring 5 mm which may be  infectious or inflammatory. Multiple additional sub-5 mm nodules  bilaterally, most lymph nodes or granulomas. Follow-up CT in 12 months  recommended for further evaluation if clinically indicated.     Memory loss     Movement disorder     Narcolepsy     Obesity     On supplemental oxygen therapy 2023    Only at night - No longer use on Bi-Pap now    OSA (obstructive sleep apnea)     Other dysphagia     Other hyperlipidemia 10/06/2020    Peripheral neuropathy     Polypharmacy 01/04/2021    Pseudoseizures     Recurrent major depressive disorder, in partial remission (HCC) 10/06/2020    Rheumatoid arthritis (HCC) 10/06/2020    Sleep disorder     Sleep Apnea & Narcolepsy Syncope and collapse     Age 53 years old    Type 2 diabetes mellitus with hyperglycemia, with long-term current use of insulin (HCC) 10/06/2020    Urinary incontinence     Urinary problem     urinary frequency    Vision decreased     diplopia       BP 124/79  - Pulse 96  - Temp 36.2 ?C (97.1 ?F)  - Wt 92.1 kg (203 lb)  - LMP  (LMP Unknown)  - SpO2 100%  - BMI 31.79 kg/m?    O: Brief Physical: rr even and unlabored, skin dry and warm. Bilateral leg 1+ edema with mild erythema.    A/P: The patient was seen by me as an initial provider in triage. A brief history and physical was obtained. My exam is intended to be an initial medial screening exam. Initial orders have been placed by me. My working diagnosis is RA flare, cellulitis, viral illness.    The patient is deemed appropriate for the main ED. The patient's  care will be resumed by the ED provider care team once the patient is roomed in the ED. A more detailed / complete H&P will be documented by those providers.

## 2023-06-11 ENCOUNTER — Encounter: Admit: 2023-06-11 | Discharge: 2023-06-11 | Payer: MEDICARE

## 2023-06-11 ENCOUNTER — Inpatient Hospital Stay: Admit: 2023-06-11 | Discharge: 2023-06-11 | Payer: MEDICARE

## 2023-06-11 MED ADMIN — METHYLPREDNISOLONE SOD SUC(PF) 40 MG/ML IJ SOLR [301232]: 10 mg | INTRAVENOUS | @ 22:00:00 | NDC 00009003930

## 2023-06-11 MED ADMIN — ACETAMINOPHEN 500 MG PO TAB [102]: 1000 mg | ORAL | @ 23:00:00 | NDC 00904673061

## 2023-06-11 MED ADMIN — INSULIN ASPART 100 UNIT/ML SC FLEXPEN [87504]: 15 [IU] | SUBCUTANEOUS | @ 17:00:00 | NDC 00169633910

## 2023-06-11 MED ADMIN — ACETAMINOPHEN 500 MG PO TAB [102]: 1000 mg | ORAL | @ 17:00:00 | NDC 00904673061

## 2023-06-11 MED ADMIN — LIDOCAINE 5 % TP PTMD [80759]: 1 | TOPICAL | @ 20:00:00 | NDC 00591352511

## 2023-06-12 ENCOUNTER — Encounter: Admit: 2023-06-12 | Discharge: 2023-06-12 | Payer: MEDICARE

## 2023-06-12 MED ADMIN — PREGABALIN 25 MG PO CAP [94902]: 25 mg | ORAL | @ 21:00:00 | NDC 00904699161

## 2023-06-12 MED ADMIN — LIDOCAINE 5 % TP PTMD [80759]: 1 | TOPICAL | @ 14:00:00 | NDC 00591352511

## 2023-06-12 MED ADMIN — ACETAMINOPHEN 500 MG PO TAB [102]: 1000 mg | ORAL | @ 04:00:00 | NDC 00904673061

## 2023-06-12 MED ADMIN — POTASSIUM CHLORIDE 20 MEQ PO TBTQ [35943]: 40 meq | ORAL | @ 15:00:00 | Stop: 2023-06-12 | NDC 00832532510

## 2023-06-12 MED ADMIN — ACETAMINOPHEN 500 MG PO TAB [102]: 1000 mg | ORAL | @ 17:00:00 | NDC 00904673061

## 2023-06-12 MED ADMIN — ACETAMINOPHEN 500 MG PO TAB [102]: 1000 mg | ORAL | @ 23:00:00 | NDC 00904673061

## 2023-06-12 MED ADMIN — PREGABALIN 25 MG PO CAP [94902]: 25 mg | ORAL | @ 01:00:00 | NDC 00904699161

## 2023-06-12 MED ADMIN — KETOROLAC 15 MG/ML IJ SOLN [22472]: 15 mg | INTRAVENOUS | @ 07:00:00 | Stop: 2023-06-12 | NDC 00409379319

## 2023-06-12 MED ADMIN — ACETAMINOPHEN 500 MG PO TAB [102]: 1000 mg | ORAL | @ 10:00:00 | NDC 00904673061

## 2023-06-12 MED ADMIN — INSULIN ASPART 100 UNIT/ML SC FLEXPEN [87504]: 12 [IU] | SUBCUTANEOUS | @ 23:00:00 | NDC 00169633910

## 2023-06-12 MED ADMIN — PREGABALIN 25 MG PO CAP [94902]: 25 mg | ORAL | @ 14:00:00 | NDC 00904699161

## 2023-06-12 MED ADMIN — PREDNISONE 5 MG/ML PO CONC [78982]: 15 mg | ORAL | @ 23:00:00 | Stop: 2023-06-17 | NDC 54029057509

## 2023-06-12 MED ADMIN — METHYLPREDNISOLONE SOD SUC(PF) 40 MG/ML IJ SOLR [301232]: 10 mg | INTRAVENOUS | @ 14:00:00 | Stop: 2023-06-12 | NDC 00009003930

## 2023-06-12 MED ADMIN — CELECOXIB 200 MG PO CAP [76958]: 200 mg | ORAL | @ 19:00:00 | NDC 00904650361

## 2023-06-13 ENCOUNTER — Encounter: Admit: 2023-06-13 | Discharge: 2023-06-13 | Payer: MEDICARE

## 2023-06-13 MED ORDER — PEN NEEDLE, DIABETIC 32 GAUGE X 5/32" MISC NDLE
0 refills | 90.00000 days | Status: AC
Start: 2023-06-13 — End: ?
  Filled 2023-06-13: qty 100, 20d supply, fill #1
  Filled 2023-06-13: qty 100, 25d supply, fill #1

## 2023-06-13 MED ADMIN — BACLOFEN 10 MG PO TAB [860]: 10 mg | ORAL | @ 02:00:00 | NDC 00172409680

## 2023-06-13 MED ADMIN — ACETAMINOPHEN 500 MG PO TAB [102]: 1000 mg | ORAL | @ 04:00:00 | NDC 00904673061

## 2023-06-13 MED ADMIN — CELECOXIB 200 MG PO CAP [76958]: 200 mg | ORAL | @ 14:00:00 | Stop: 2023-06-13 | NDC 00904650361

## 2023-06-13 MED ADMIN — LIDOCAINE 5 % TP PTMD [80759]: 1 | TOPICAL | @ 15:00:00 | Stop: 2023-06-13 | NDC 00591352511

## 2023-06-13 MED ADMIN — CELECOXIB 200 MG PO CAP [76958]: 200 mg | ORAL | @ 02:00:00 | NDC 00904650361

## 2023-06-13 MED ADMIN — ACETAMINOPHEN 500 MG PO TAB [102]: 1000 mg | ORAL | @ 16:00:00 | Stop: 2023-06-13 | NDC 00904673061

## 2023-06-13 MED ADMIN — ACETAMINOPHEN 500 MG PO TAB [102]: 1000 mg | ORAL | @ 11:00:00 | Stop: 2023-06-13 | NDC 00904673061

## 2023-06-13 MED ADMIN — PREGABALIN 25 MG PO CAP [94902]: 25 mg | ORAL | @ 14:00:00 | Stop: 2023-06-13 | NDC 00904699161

## 2023-06-13 MED ADMIN — PREGABALIN 25 MG PO CAP [94902]: 25 mg | ORAL | @ 02:00:00 | NDC 00904699161

## 2023-06-13 MED FILL — BACLOFEN 10 MG PO TAB: 10 mg | ORAL | 30 days supply | Qty: 90 | Fill #1 | Status: CP

## 2023-06-13 MED FILL — PREDNISONE 5 MG PO TAB: 5 mg | ORAL | 14 days supply | Qty: 27 | Fill #1 | Status: CP

## 2023-06-13 MED FILL — INSULIN GLARGINE 100 UNIT/ML (3 ML) SC INJ PEN: 100 unit/mL (3 mL) | SUBCUTANEOUS | 56 days supply | Qty: 45 | Fill #1 | Status: CP

## 2023-06-13 MED FILL — LANCETS 33 GAUGE MISC MISC: 33 gauge | 25 days supply | Qty: 100 | Fill #1 | Status: CP

## 2023-06-13 MED FILL — BLOOD-GLUCOSE METER MISC MISC: 30 days supply | Qty: 1 | Fill #1 | Status: CP

## 2023-06-13 MED FILL — PREGABALIN 25 MG PO CAP: 25 mg | ORAL | 30 days supply | Qty: 90 | Fill #1 | Status: CP

## 2023-06-14 ENCOUNTER — Encounter: Admit: 2023-06-14 | Discharge: 2023-06-14 | Payer: MEDICARE

## 2023-06-14 MED FILL — METHOTREXATE SODIUM (PF) 25 MG/ML IJ SOLN: 25 mg/mL | SUBCUTANEOUS | 28 days supply | Qty: 8 | Fill #1 | Status: AC

## 2023-06-16 ENCOUNTER — Encounter: Admit: 2023-06-16 | Discharge: 2023-06-16 | Payer: MEDICARE

## 2023-06-16 NOTE — Progress Notes
Attempted to reach patient to provide injection education for methotrexate. Left voicemail requesting a call back. Provided patient with pharmacist clinic phone number [phone]). If no return call from patient, will attempt again at a later date.    Catalia Massett, Rheumatology Clinic Pharmacist

## 2023-06-16 NOTE — Telephone Encounter
-----   Message from Pietrina Jagodzinski O sent at 06/13/2023  3:53 PM CDT -----  Regarding: FW: Rituximab infusions  Contact: [phone]    ----- Message -----  From: Peggye Form, MD  Sent: 06/13/2023   2:55 PM CDT  To: Despina Hidden, RN; Glenna Fellows, RN; #  Subject: FW: Rituximab infusions                          Thank you Miranda for sending the LDN in    Noted rheumatology consult team's recs regarding MTX dose increase to 25mg  once weekly in SQ formulation    I will send in the order for prior authorization approval.  While we are waiting for the subcutaneous formulation to be approved patient can continue with her current methotrexate dose.  Once we are able to have the methotrexate 25 mg subcutaneous on  ce weekly approved please have the patient get her DMARD laboratory monitoring once every 4 weeks for the first 12 weeks and if everything stable can be spread out to every 12 weeks.    Pharmacy team, could you provide education about methotrexate subcutaneous once approved?    Patient to continue with folic acid while on methotrexate.    Once methotrexate 25 mg subcutaneous once weekly is approved, patient to stop p.o. methotrexate and this should be discontinued from her medication list.    Thank you team      ----- Message -----  From: Glenna Fellows, RN  Sent: 06/13/2023   2:48 PM CDT  To: Peggye Form, MD  Subject: Annell Greening: Rituximab infusions                            ----- Message -----  From: Earnestine Leys  Sent: 06/13/2023   2:29 PM CDT  To: Despina Hidden, RN  Subject: Rituximab infusions                              Couple questions?.    The team talked about going to Methotrexate injections at 25mg , is this still the plan?      Also the medication you prescribed last time we were in clinic has been discontinued by pharmacy?  I don?t know the reason, but the pharmacy at [facility] told me you would need to send a new script if you still wanted her on it.      Thanks,  Natalia Leatherwood

## 2023-07-01 NOTE — Progress Notes
Hailey Rodgers is a plesant 72 y.o. female.    Here for sleep apnea follow up.Known pt to Dr. Clarene Critchley  She takes no sleep medications. Bedtime is 12 to 1AM, takes 10 minutes to fall asleep, wakes up 2 times per night, falls back to sleep easily, sometimes she falls asleep on the toilet, wake time is 10 to 11AM.  Naps off and on at times. Wears a full face mask that fits mostly comfortably with no leaks. This is her yearly follow up for sleep. She reports that she was in the hospital for an RA flare up. She recently changed her DME to [facility]. She is getting supplies ok from them. She is very sleepy and dozing in the visit today. She reports that she established care with [facility] via telehealth. She has been off Trulicity for awhile now due to there being a shortage. She continues to struggle with her blood sugars.            [place] 20/24    Objective:          acetaminophen (TYLENOL EXTRA STRENGTH) 500 mg tablet Take two tablets by mouth every 6 hours as needed. Max of 4,000 mg of acetaminophen in 24 hours.  Indications: pain    aspirin 81 mg chewable tablet Chew one tablet by mouth at bedtime daily.    baclofen (LIORESAL) 10 mg tablet Take one tablet by mouth three times daily as needed for Muscle Cramps or Pain. 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 ICD-10: Type 2 diabetes with hyperglycemia, insulin use E11.65, Z79.4  Indications: type 2 diabetes mellitus    blood-glucose meter kit ICD-10: Type 2 diabetes with hyperglycemia, insulin use E11.65, Z79.4  Indications: type 2 diabetes mellitus    bumetanide (BUMEX) 1 mg tablet Take one tablet by mouth daily.    calcium carbonate (CALTRATE 600) 600 mg calcium (1,500 mg) tablet Take two tablets by mouth twice daily.    celecoxib (CELEBREX) 200 mg capsule Take one capsule by mouth twice daily. Indications: rheumatoid arthritis    CHOLEcalciferoL (vitamin D3) (OPTIMAL D3) 50,000 units capsule Take 1 capsule by mouth once weekly    cyclobenzaprine (FLEXERIL) 5 mg tablet Take one tablet by mouth three times daily as needed for muscle spasms.    diclofenac sodium (ARTHRITIS PAIN (DICLOFENAC)) 1 % topical gel Apply four g topically to affected area three times daily.    docusate (COLACE) 100 mg capsule Take two capsules by mouth twice daily.    famotidine (PEPCID) 40 mg tablet Take one tablet by mouth daily.    ferrous sulfate (FEOSOL) 325 mg (65 mg iron) tablet Take one tablet by mouth twice daily. Take on an empty stomach at least 1 hour before or 2 hours after food.    fexofenadine(+) (ALLEGRA) 180 mg tablet Take one tablet by mouth daily.    fluocinolone acetonide oil (DERMOTIC OIL) 0.01 % otic drops INSTILL 3 DROPS INTO NOSE DAILY AS NEEDED    folic acid (FOLVITE) 1 mg tablet Take four tablets by mouth daily.    insulin aspart (U-100) (NOVOLOG FLEXPEN U-100 INSULIN) 100 unit/mL (3 mL) PEN Inject thirty Units under the skin three times daily with meals with a sliding scale 201-250 = +3 units, 251-300 = +6 units, greater than 300 = +9 units, Total Daily units : 117 units    insulin glargine (LANTUS  SOLOSTAR U-100 INSULIN) 100 unit/mL (3 mL) subcutaneous PEN Inject forty Units under the skin twice daily. Indications: type 2 diabetes mellitus    JARDIANCE 25 mg tablet Take one tablet by mouth daily.    lactobacillus rhamnosus GG (CULTURELLE) 15 billion cell capsule Take one capsule by mouth as directed daily.    lancets 33 gauge 33 gauge Use one each as directed before meals and at bedtime. ICD-10: Type 2 diabetes with hyperglycemia, insulin use E11.65, Z79.4  Indications: type 2 diabetes mellitus    levothyroxine (SYNTHROID) 88 mcg tablet Take one tablet by mouth every morning.    methotrexate PF 25 mg/mL injection Inject 1 mL under the skin every 7 days. Patient to stop oral methotrexate once subcutaneous methotrexate formulation is approved and started. Single use vials. Only use for one dose ,then discard.    methotrexate sodium 2.5 mg tablet Take eight tablets by mouth every 7 days.    mupirocin (BACTROBAN) 2 % topical ointment Apply 1 application in the nostrils twice a day    naltrexone 1.5 mg oral capsule (BATCHED COMPOUND) Take one capsule by mouth daily.    nitroglycerin (NITROSTAT) 0.4 mg tablet Place one tablet under tongue every 5 minutes as needed for Chest Pain. Max 3 tablets, call 911.    nystatin (MYCOSTATIN) 100,000 unit/g topical cream Apply  topically to affected area twice daily as needed.    ondansetron (ZOFRAN ODT) 8 mg rapid dissolve tablet Dissolve one tablet by mouth every 8 hours as needed.    pantoprazole DR (PROTONIX) 40 mg tablet Take one tablet by mouth twice daily.    pen needle, diabetic (BD NANO 2ND GEN PEN NEEDLE) 32 gauge x 5/32 pen needle Use  as directed 5 times daily    polyethylene glycol 3350 (MIRALAX) 17 g packet Take one packet by mouth twice daily as needed.    pramipexole (MIRAPEX) 0.75 mg tablet Take one tablet by mouth daily.    pregabalin (LYRICA) 25 mg capsule Take one capsule by mouth three times daily. Indications: diabetic complication causing injury to some body nerves    rosuvastatin (CRESTOR) 20 mg tablet Take one tablet by mouth daily.    sennosides-docusate sodium (SENNA-S) 8.6/50 mg tablet Take one tablet by mouth daily.    spironolactone (ALDACTONE) 25 mg tablet Take one tablet by mouth daily. Take with food.    triamcinolone acetonide 0.5 % ointment Apply  topically to affected area three times daily as needed.    TRULICITY 3 mg/0.5 mL injection pen     venlafaxine XR (EFFEXOR XR) 150 mg capsule Take one capsule by mouth daily.    vibegron (GEMTESA) 75 mg tablet Take one tablet by mouth at bedtime daily.    vitamins, multi w/minerals 9 mg iron-400 mcg tab Take one tablet by mouth daily.     Vitals:    07/08/23 1452   BP: 110/68   BP Source: Arm, Right Upper   Pulse: 60   Temp: 36.3 ?C (97.4 ?F)   Resp: 20 SpO2: 100%   TempSrc: Oral   PainSc: Nine   Weight: 89.8 kg (198 lb)   Height: 167.6 cm (5' 6)     Body mass index is 31.96 kg/m?[name]     Physical Exam  Constitutional:       Appearance: Normal appearance.   HENT:      Head: Normocephalic.   Eyes:      Pupils: Pupils are equal, round, and reactive to light.   Pulmonary:  Effort: Pulmonary effort is normal.   Neurological:      General: No focal deficit present.      Mental Status: She is alert and oriented to person, place, and time. Mental status is at baseline.   Psychiatric:         Mood and Affect: Mood normal.         Behavior: Behavior normal.         Thought Content: Thought content normal.         Judgment: Judgment normal.              Reviewed CPAP download.  RN obtained PAP download. 9/30 days of usage.   Wearing 4 hours or greater 17% of nights. The average use on days used was 4 hours and 2 minutes.  Residual AHI 0.5.    The median leak was  L/min.      Assessment and Plan:    Problem   Obstructive Sleep Apnea    Split Night 06/27/21 AHI 102.5 <88% 13.91min  pt did not want  Split Night 05/13/22 AHI 76.6 <88% 48. Bipap at 18/14 cm H2O effective    Pt set up with BiPAP @ 18/14cm H2O on 06/04/22.  DME:  [facility] / [facility]  Insurance: Medicare  If the patient has to meet compliance, their compliance window will be from 08/04/22 to 09/01/22.          Obstructive sleep apnea  Has worn BIPAP 9 days   5 days greater than 4 hours  We did revisit her dx of severe sleep apnea with hypoxia  Strongly encouraged to wear pap all night, every night  Continue BiPAP @ 18/14cm H2O                 We discussed the importance of appropriate cleaning and replacement of supplies     Follow up in 1 years time.    Total Time Today was 25 minutes in the following activities: Preparing to see the patient, Obtaining and/or reviewing separately obtained history, Performing a medically appropriate examination and/or evaluation, Counseling and educating the patient/family/caregiver, Documenting clinical information in the electronic or other health record, Independently interpreting results (not separately reported) and communicating results to the patient/family/caregiver, and Care coordination (not separately reported)

## 2023-07-01 NOTE — Patient Instructions
Durable Medical Equipment (DME) orders take 7-14 days to process with your DME. If you have not heard from your DME about your supplies, please call them. If your DME does not have your orders after 2 weeks please let us know. Thank you for your patience.    - If you need to schedule or reschedule an appointment, please call [phone].     -For refills on medications, please have your pharmacy fax a refill authorization request form to our office at [phone]. Please allow at least 3 business days for refill requests.    - For any urgent issues after business hours, please call [phone] and have the on call pulmonary physician paged.      - If you have any questions regarding your appointment or any concerns, please contact Adalbert Alberto Austin-Ellis, RN at [phone].

## 2023-07-06 ENCOUNTER — Encounter: Admit: 2023-07-06 | Discharge: 2023-07-06 | Payer: MEDICARE

## 2023-07-06 MED ORDER — PRAMIPEXOLE 0.75 MG PO TAB
.75 mg | ORAL_TABLET | Freq: Three times a day (TID) | ORAL | 3 refills
Start: 2023-07-06 — End: ?

## 2023-07-06 MED ORDER — VENLAFAXINE 150 MG PO CP24
150 mg | ORAL_CAPSULE | Freq: Every day | ORAL | 3 refills
Start: 2023-07-06 — End: ?

## 2023-07-07 ENCOUNTER — Encounter: Admit: 2023-07-07 | Discharge: 2023-07-07 | Payer: MEDICARE

## 2023-07-07 MED ORDER — VENLAFAXINE 150 MG PO CP24
150 mg | ORAL_CAPSULE | Freq: Every day | ORAL | 3 refills | Status: AC
Start: 2023-07-07 — End: ?
  Filled 2023-07-08: qty 90, 90d supply, fill #1

## 2023-07-07 MED ORDER — PRAMIPEXOLE 0.75 MG PO TAB
.75 mg | ORAL_TABLET | Freq: Every day | ORAL | 3 refills | 45.00000 days | Status: AC
Start: 2023-07-07 — End: ?
  Filled 2023-07-08: qty 90, 90d supply, fill #1

## 2023-07-07 MED FILL — GEMTESA 75 MG PO TAB: 75 mg | ORAL | 30 days supply | Qty: 30 | Fill #3 | Status: AC

## 2023-07-07 MED FILL — FAMOTIDINE 40 MG PO TAB: 40 mg | ORAL | 30 days supply | Qty: 30 | Fill #1 | Status: AC

## 2023-07-08 ENCOUNTER — Encounter: Admit: 2023-07-08 | Discharge: 2023-07-08 | Payer: MEDICARE

## 2023-07-08 ENCOUNTER — Ambulatory Visit: Admit: 2023-07-08 | Discharge: 2023-07-09 | Payer: MEDICARE

## 2023-07-08 DIAGNOSIS — J45909 Unspecified asthma, uncomplicated: Secondary | ICD-10-CM

## 2023-07-08 DIAGNOSIS — R54 Age-related physical debility: Secondary | ICD-10-CM

## 2023-07-08 DIAGNOSIS — G259 Extrapyramidal and movement disorder, unspecified: Secondary | ICD-10-CM

## 2023-07-08 DIAGNOSIS — E7849 Other hyperlipidemia: Secondary | ICD-10-CM

## 2023-07-08 DIAGNOSIS — I2510 Atherosclerotic heart disease of native coronary artery without angina pectoris: Secondary | ICD-10-CM

## 2023-07-08 DIAGNOSIS — Z79899 Other long term (current) drug therapy: Secondary | ICD-10-CM

## 2023-07-08 DIAGNOSIS — H547 Unspecified visual loss: Secondary | ICD-10-CM

## 2023-07-08 DIAGNOSIS — M1990 Unspecified osteoarthritis, unspecified site: Secondary | ICD-10-CM

## 2023-07-08 DIAGNOSIS — C801 Malignant (primary) neoplasm, unspecified: Secondary | ICD-10-CM

## 2023-07-08 DIAGNOSIS — M069 Rheumatoid arthritis, unspecified: Secondary | ICD-10-CM

## 2023-07-08 DIAGNOSIS — M4802 Spinal stenosis, cervical region: Secondary | ICD-10-CM

## 2023-07-08 DIAGNOSIS — E785 Hyperlipidemia, unspecified: Secondary | ICD-10-CM

## 2023-07-08 DIAGNOSIS — J40 Bronchitis, not specified as acute or chronic: Secondary | ICD-10-CM

## 2023-07-08 DIAGNOSIS — G4733 Obstructive sleep apnea (adult) (pediatric): Secondary | ICD-10-CM

## 2023-07-08 DIAGNOSIS — G479 Sleep disorder, unspecified: Secondary | ICD-10-CM

## 2023-07-08 DIAGNOSIS — R4189 Other symptoms and signs involving cognitive functions and awareness: Secondary | ICD-10-CM

## 2023-07-08 DIAGNOSIS — R3989 Other symptoms and signs involving the genitourinary system: Secondary | ICD-10-CM

## 2023-07-08 DIAGNOSIS — F445 Conversion disorder with seizures or convulsions: Secondary | ICD-10-CM

## 2023-07-08 DIAGNOSIS — Z9981 Dependence on supplemental oxygen: Secondary | ICD-10-CM

## 2023-07-08 DIAGNOSIS — R32 Unspecified urinary incontinence: Secondary | ICD-10-CM

## 2023-07-08 DIAGNOSIS — R1319 Other dysphagia: Secondary | ICD-10-CM

## 2023-07-08 DIAGNOSIS — J309 Allergic rhinitis, unspecified: Secondary | ICD-10-CM

## 2023-07-08 DIAGNOSIS — K5790 Diverticulosis of intestine, part unspecified, without perforation or abscess without bleeding: Secondary | ICD-10-CM

## 2023-07-08 DIAGNOSIS — K219 Gastro-esophageal reflux disease without esophagitis: Secondary | ICD-10-CM

## 2023-07-08 DIAGNOSIS — M48061 Spinal stenosis, lumbar region without neurogenic claudication: Secondary | ICD-10-CM

## 2023-07-08 DIAGNOSIS — R29898 Other symptoms and signs involving the musculoskeletal system: Secondary | ICD-10-CM

## 2023-07-08 DIAGNOSIS — E669 Obesity, unspecified: Secondary | ICD-10-CM

## 2023-07-08 DIAGNOSIS — R911 Solitary pulmonary nodule: Secondary | ICD-10-CM

## 2023-07-08 DIAGNOSIS — E039 Hypothyroidism, unspecified: Secondary | ICD-10-CM

## 2023-07-08 DIAGNOSIS — K760 Fatty (change of) liver, not elsewhere classified: Secondary | ICD-10-CM

## 2023-07-08 DIAGNOSIS — F3341 Major depressive disorder, recurrent, in partial remission: Secondary | ICD-10-CM

## 2023-07-08 DIAGNOSIS — G629 Polyneuropathy, unspecified: Secondary | ICD-10-CM

## 2023-07-08 DIAGNOSIS — U071 COVID-19 virus infection: Secondary | ICD-10-CM

## 2023-07-08 DIAGNOSIS — G47419 Narcolepsy without cataplexy: Secondary | ICD-10-CM

## 2023-07-08 DIAGNOSIS — E1165 Type 2 diabetes mellitus with hyperglycemia: Secondary | ICD-10-CM

## 2023-07-08 DIAGNOSIS — R55 Syncope and collapse: Secondary | ICD-10-CM

## 2023-07-08 DIAGNOSIS — E034 Atrophy of thyroid (acquired): Secondary | ICD-10-CM

## 2023-07-08 DIAGNOSIS — E119 Type 2 diabetes mellitus without complications: Secondary | ICD-10-CM

## 2023-07-08 DIAGNOSIS — R413 Other amnesia: Secondary | ICD-10-CM

## 2023-07-08 MED FILL — FOLIC ACID 1 MG PO TAB: 1 mg | ORAL | 30 days supply | Qty: 120 | Fill #9 | Status: CP

## 2023-07-08 NOTE — Assessment & Plan Note
Has worn BIPAP 9 days   5 days greater than 4 hours  We did revisit her dx of severe sleep apnea with hypoxia  Strongly encouraged to wear pap all night, every night  Continue BiPAP @ 18/14cm H2O

## 2023-07-11 ENCOUNTER — Encounter: Admit: 2023-07-11 | Discharge: 2023-07-11 | Payer: MEDICARE

## 2023-07-11 MED FILL — PREGABALIN 25 MG PO CAP: 25 mg | ORAL | 30 days supply | Qty: 90 | Fill #2 | Status: CP

## 2023-07-11 MED FILL — METHOTREXATE SODIUM (PF) 25 MG/ML IJ SOLN: 25 mg/mL | SUBCUTANEOUS | 28 days supply | Qty: 8 | Fill #2 | Status: CP

## 2023-07-11 MED FILL — CELECOXIB 200 MG PO CAP: 200 mg | ORAL | 30 days supply | Qty: 60 | Fill #1 | Status: CP

## 2023-07-11 MED FILL — INSULIN ASPART 100 UNIT/ML SC FLEXPEN: 100 unit/mL (3 mL) | SUBCUTANEOUS | 25 days supply | Qty: 30 | Fill #3 | Status: CP

## 2023-07-14 ENCOUNTER — Encounter: Admit: 2023-07-14 | Discharge: 2023-07-14 | Payer: MEDICARE

## 2023-07-14 MED FILL — ROSUVASTATIN 20 MG PO TAB: 20 mg | ORAL | 90 days supply | Qty: 90 | Fill #4 | Status: AC

## 2023-07-14 NOTE — Telephone Encounter
Notification received from scheduling team that pt is requesting to schedule an infusion before September. New orders need to be placed to allow for sooner visit as order in place for 180 day which next dose would be due in September.    Per LOV note on 05/28/23    PLAN:   -discussed options  -She started rituximab infusions 1 g days 1 and 15 every 6 months 03/03/23 and 03/17/2023. We will continue current dose at this time, and if there is concern for breakthrough or worsening RA from an MSK or ILD perspective, then can consider increasing to 1g once every 4 months.  -given symptoms concerning for myofascial pain syndrome, will start low dose naltrexone 1.5mg  once daily. Provided more education about risks and benefits about low dose naltrexone. Patient denies suicidal ideations this visit. Can uptritrate slowly by 1.5mg  every 1-2 weeks, up to a max of ~25mg  once daily.  -check TSH, LFTs, B12 and total vitamin D.  -continue methotrexate laboratory monitoring every 12 weeks.  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     Addendum 06/11/23: Patient is on methotrexate po 20 mg once weekly as part of her RA regimen in addition to rituximab.  Discussed with [place] rheumatology consults team, agree with consult team, will have the patient's rituximab 1 g days 1 and 15 scheduled for 06/2023 given moderate disease activity on their evaluation.  In the future Can consider increasing methotrexate to maximum dose of 25 mg once weekly and can consider increasing low-dose naltrexone dose if concern for better control for myofascial pain syndrome.  Will coordinate with [place] rheumatology pharmacy team regarding having the patient scheduled for a sooner date of her rituximab.     All of the patient's questions were answered until they had none. They voiced understanding and agreement with the plans.      Patient seen and discussed with Dr. Maxwell Caul.

## 2023-07-15 ENCOUNTER — Encounter: Admit: 2023-07-15 | Discharge: 2023-07-15 | Payer: MEDICARE

## 2023-07-15 MED ORDER — ACETAMINOPHEN 500 MG PO TAB
500 mg | Freq: Once | ORAL | 0 refills
Start: 2023-07-15 — End: ?

## 2023-07-15 MED ORDER — METHYLPREDNISOLONE SOD SUC(PF) 125 MG/2 ML IJ SOLR
100 mg | Freq: Once | INTRAVENOUS | 0 refills
Start: 2023-07-15 — End: ?

## 2023-07-15 MED ORDER — RITUXIMAB IVPB
1000 mg | Freq: Once | INTRAVENOUS | 0 refills | Status: CN
Start: 2023-07-15 — End: ?

## 2023-07-15 MED ORDER — DIPHENHYDRAMINE HCL 25 MG PO CAP
25 mg | Freq: Once | ORAL | 0 refills
Start: 2023-07-15 — End: ?

## 2023-07-15 MED ORDER — RITUXIMAB IVPB
1000 mg | Freq: Once | INTRAVENOUS | 0 refills
Start: 2023-07-15 — End: ?

## 2023-07-15 MED ORDER — DIPHENHYDRAMINE HCL 25 MG PO CAP
25 mg | Freq: Once | ORAL | 0 refills | Status: CN
Start: 2023-07-15 — End: ?

## 2023-07-15 MED ORDER — METHYLPREDNISOLONE SOD SUC(PF) 125 MG/2 ML IJ SOLR
100 mg | Freq: Once | INTRAVENOUS | 0 refills | Status: CN
Start: 2023-07-15 — End: ?

## 2023-07-15 MED ORDER — ACETAMINOPHEN 500 MG PO TAB
500 mg | Freq: Once | ORAL | 0 refills | Status: CN
Start: 2023-07-15 — End: ?

## 2023-07-15 MED FILL — RXAMB NALTREXONE 1.5 MG ORAL CAPSULE (BATCHED COMPOUND): ORAL | 30 days supply | Qty: 0.05 | Fill #2 | Status: AC

## 2023-07-17 MED ORDER — ACETAMINOPHEN 500 MG PO TAB
500 mg | Freq: Once | ORAL | 0 refills | Status: CN
Start: 2023-07-17 — End: ?

## 2023-07-17 MED ORDER — METHYLPREDNISOLONE SOD SUC(PF) 125 MG/2 ML IJ SOLR
100 mg | Freq: Once | INTRAVENOUS | 0 refills | Status: CN
Start: 2023-07-17 — End: ?

## 2023-07-17 MED ORDER — RITUXIMAB IVPB
1000 mg | Freq: Once | INTRAVENOUS | 0 refills | Status: CN
Start: 2023-07-17 — End: ?

## 2023-07-17 MED ORDER — DIPHENHYDRAMINE HCL 25 MG PO CAP
25 mg | Freq: Once | ORAL | 0 refills | Status: CN
Start: 2023-07-17 — End: ?

## 2023-07-18 ENCOUNTER — Encounter: Admit: 2023-07-18 | Discharge: 2023-07-18 | Payer: MEDICARE

## 2023-07-18 ENCOUNTER — Ambulatory Visit: Admit: 2023-07-18 | Discharge: 2023-07-19 | Payer: MEDICARE

## 2023-07-18 DIAGNOSIS — Z79899 Other long term (current) drug therapy: Secondary | ICD-10-CM

## 2023-07-18 LAB — CBC AND DIFF
ABSOLUTE BASO COUNT: 0 10*3/uL (ref 0–0.20)
ABSOLUTE EOS COUNT: 0 10*3/uL (ref 0–0.45)
ABSOLUTE LYMPH COUNT: 0.4 10*3/uL — ABNORMAL LOW (ref 1.0–4.8)
ABSOLUTE MONO COUNT: 0 10*3/uL (ref 0–0.80)
ABSOLUTE NEUTROPHIL: 3.9 10*3/uL (ref 1.8–7.0)
BASOPHILS %: 1 % (ref 0–2)
EOSINOPHILS %: 0 % (ref 0–5)
HEMATOCRIT: 37 % (ref 36–45)
HEMOGLOBIN: 12 g/dL (ref 12.0–15.0)
LYMPHOCYTES %: 9 % — ABNORMAL LOW (ref 24–44)
MCH: 28 pg (ref 26–34)
MCHC: 32 g/dL (ref 32.0–36.0)
MCV: 87 FL (ref 80–100)
MONOCYTES %: 1 % — ABNORMAL LOW (ref 4–12)
MPV: 9 FL (ref 7–11)
NEUTROPHILS %: 89 % — ABNORMAL HIGH (ref 41–77)
PLATELET COUNT: 242 10*3/uL (ref 150–400)
RBC COUNT: 4.3 M/UL (ref 4.0–5.0)
RDW: 13 % (ref 11–15)
WBC COUNT: 4.5 10*3/uL (ref 4.5–11.0)

## 2023-07-18 LAB — CREATININE: CREATININE: 0.7 mg/dL (ref 0.4–1.00)

## 2023-07-18 LAB — ALT (SGPT): ALT: 27 U/L (ref >60)

## 2023-07-18 MED ORDER — RITUXIMAB IVPB
1000 mg | Freq: Once | INTRAVENOUS | 0 refills
Start: 2023-07-18 — End: ?

## 2023-07-18 MED ORDER — ACETAMINOPHEN 500 MG PO TAB
500 mg | Freq: Once | ORAL | 0 refills
Start: 2023-07-18 — End: ?

## 2023-07-18 MED ORDER — ACETAMINOPHEN 500 MG PO TAB
500 mg | Freq: Once | ORAL | 0 refills | Status: CP
Start: 2023-07-18 — End: ?
  Administered 2023-07-18: 13:00:00 500 mg via ORAL

## 2023-07-18 MED ORDER — RITUXIMAB IVPB
1000 mg | Freq: Once | INTRAVENOUS | 0 refills | Status: CP
Start: 2023-07-18 — End: ?
  Administered 2023-07-18 (×2): 1000 mg via INTRAVENOUS

## 2023-07-18 MED ORDER — DIPHENHYDRAMINE HCL 25 MG PO CAP
25 mg | Freq: Once | ORAL | 0 refills
Start: 2023-07-18 — End: ?

## 2023-07-18 MED ORDER — DIPHENHYDRAMINE HCL 25 MG PO CAP
25 mg | Freq: Once | ORAL | 0 refills | Status: CP
Start: 2023-07-18 — End: ?
  Administered 2023-07-18: 13:00:00 25 mg via ORAL

## 2023-07-18 MED ORDER — METHYLPREDNISOLONE SOD SUC(PF) 125 MG/2 ML IJ SOLR
100 mg | Freq: Once | INTRAVENOUS | 0 refills
Start: 2023-07-18 — End: ?

## 2023-07-18 MED ORDER — METHYLPREDNISOLONE SOD SUC(PF) 125 MG/2 ML IJ SOLR
100 mg | Freq: Once | INTRAVENOUS | 0 refills | Status: CP
Start: 2023-07-18 — End: ?
  Administered 2023-07-18: 13:00:00 100 mg via INTRAVENOUS

## 2023-07-19 DIAGNOSIS — M0579 Rheumatoid arthritis with rheumatoid factor of multiple sites without organ or systems involvement: Secondary | ICD-10-CM

## 2023-07-28 ENCOUNTER — Encounter: Admit: 2023-07-28 | Discharge: 2023-07-28 | Payer: MEDICARE

## 2023-08-01 ENCOUNTER — Ambulatory Visit: Admit: 2023-08-01 | Discharge: 2023-08-01 | Payer: MEDICARE

## 2023-08-01 ENCOUNTER — Encounter: Admit: 2023-08-01 | Discharge: 2023-08-01 | Payer: MEDICARE

## 2023-08-01 DIAGNOSIS — Z79899 Other long term (current) drug therapy: Secondary | ICD-10-CM

## 2023-08-01 DIAGNOSIS — M0579 Rheumatoid arthritis with rheumatoid factor of multiple sites without organ or systems involvement: Secondary | ICD-10-CM

## 2023-08-01 MED ORDER — RITUXIMAB IVPB
1000 mg | Freq: Once | INTRAVENOUS | 0 refills | Status: CP
Start: 2023-08-01 — End: ?
  Administered 2023-08-01 (×2): 1000 mg via INTRAVENOUS

## 2023-08-01 MED ORDER — ACETAMINOPHEN 500 MG PO TAB
500 mg | Freq: Once | ORAL | 0 refills
Start: 2023-08-01 — End: ?

## 2023-08-01 MED ORDER — ACETAMINOPHEN 500 MG PO TAB
500 mg | Freq: Once | ORAL | 0 refills | Status: CP
Start: 2023-08-01 — End: ?
  Administered 2023-08-01: 17:00:00 500 mg via ORAL

## 2023-08-01 MED ORDER — RITUXIMAB IVPB
1000 mg | Freq: Once | INTRAVENOUS | 0 refills
Start: 2023-08-01 — End: ?

## 2023-08-01 MED ORDER — METHYLPREDNISOLONE SOD SUC(PF) 125 MG/2 ML IJ SOLR
100 mg | Freq: Once | INTRAVENOUS | 0 refills
Start: 2023-08-01 — End: ?

## 2023-08-01 MED ORDER — METHYLPREDNISOLONE SOD SUC(PF) 125 MG/2 ML IJ SOLR
100 mg | Freq: Once | INTRAVENOUS | 0 refills | Status: CP
Start: 2023-08-01 — End: ?
  Administered 2023-08-01: 17:00:00 100 mg via INTRAVENOUS

## 2023-08-01 MED ORDER — DIPHENHYDRAMINE HCL 25 MG PO CAP
25 mg | Freq: Once | ORAL | 0 refills
Start: 2023-08-01 — End: ?

## 2023-08-01 MED ORDER — DIPHENHYDRAMINE HCL 25 MG PO CAP
25 mg | Freq: Once | ORAL | 0 refills | Status: CP
Start: 2023-08-01 — End: ?
  Administered 2023-08-01: 17:00:00 25 mg via ORAL

## 2023-08-03 ENCOUNTER — Encounter: Admit: 2023-08-03 | Discharge: 2023-08-03 | Payer: MEDICARE

## 2023-08-03 MED ORDER — BUMETANIDE 1 MG PO TAB
1 mg | ORAL_TABLET | Freq: Every day | ORAL | 0 refills
Start: 2023-08-03 — End: ?

## 2023-08-04 ENCOUNTER — Ambulatory Visit: Admit: 2023-08-04 | Discharge: 2023-08-04 | Payer: MEDICARE

## 2023-08-04 ENCOUNTER — Encounter: Admit: 2023-08-04 | Discharge: 2023-08-04 | Payer: MEDICARE

## 2023-08-04 DIAGNOSIS — R3989 Other symptoms and signs involving the genitourinary system: Secondary | ICD-10-CM

## 2023-08-04 DIAGNOSIS — Z79899 Other long term (current) drug therapy: Secondary | ICD-10-CM

## 2023-08-04 DIAGNOSIS — R29898 Other symptoms and signs involving the musculoskeletal system: Secondary | ICD-10-CM

## 2023-08-04 DIAGNOSIS — E785 Hyperlipidemia, unspecified: Secondary | ICD-10-CM

## 2023-08-04 DIAGNOSIS — F445 Conversion disorder with seizures or convulsions: Secondary | ICD-10-CM

## 2023-08-04 DIAGNOSIS — G479 Sleep disorder, unspecified: Secondary | ICD-10-CM

## 2023-08-04 DIAGNOSIS — H25813 Combined forms of age-related cataract, bilateral: Secondary | ICD-10-CM

## 2023-08-04 DIAGNOSIS — C801 Malignant (primary) neoplasm, unspecified: Secondary | ICD-10-CM

## 2023-08-04 DIAGNOSIS — J40 Bronchitis, not specified as acute or chronic: Secondary | ICD-10-CM

## 2023-08-04 DIAGNOSIS — F3341 Major depressive disorder, recurrent, in partial remission: Secondary | ICD-10-CM

## 2023-08-04 DIAGNOSIS — J309 Allergic rhinitis, unspecified: Secondary | ICD-10-CM

## 2023-08-04 DIAGNOSIS — G259 Extrapyramidal and movement disorder, unspecified: Secondary | ICD-10-CM

## 2023-08-04 DIAGNOSIS — M069 Rheumatoid arthritis, unspecified: Secondary | ICD-10-CM

## 2023-08-04 DIAGNOSIS — K5790 Diverticulosis of intestine, part unspecified, without perforation or abscess without bleeding: Secondary | ICD-10-CM

## 2023-08-04 DIAGNOSIS — E039 Hypothyroidism, unspecified: Secondary | ICD-10-CM

## 2023-08-04 DIAGNOSIS — M4802 Spinal stenosis, cervical region: Secondary | ICD-10-CM

## 2023-08-04 DIAGNOSIS — M1990 Unspecified osteoarthritis, unspecified site: Secondary | ICD-10-CM

## 2023-08-04 DIAGNOSIS — G629 Polyneuropathy, unspecified: Secondary | ICD-10-CM

## 2023-08-04 DIAGNOSIS — R55 Syncope and collapse: Secondary | ICD-10-CM

## 2023-08-04 DIAGNOSIS — G47419 Narcolepsy without cataplexy: Secondary | ICD-10-CM

## 2023-08-04 DIAGNOSIS — E7849 Other hyperlipidemia: Secondary | ICD-10-CM

## 2023-08-04 DIAGNOSIS — R32 Unspecified urinary incontinence: Secondary | ICD-10-CM

## 2023-08-04 DIAGNOSIS — E119 Type 2 diabetes mellitus without complications: Secondary | ICD-10-CM

## 2023-08-04 DIAGNOSIS — E669 Obesity, unspecified: Secondary | ICD-10-CM

## 2023-08-04 DIAGNOSIS — R413 Other amnesia: Secondary | ICD-10-CM

## 2023-08-04 DIAGNOSIS — U071 COVID-19 virus infection: Secondary | ICD-10-CM

## 2023-08-04 DIAGNOSIS — E034 Atrophy of thyroid (acquired): Secondary | ICD-10-CM

## 2023-08-04 DIAGNOSIS — I2510 Atherosclerotic heart disease of native coronary artery without angina pectoris: Secondary | ICD-10-CM

## 2023-08-04 DIAGNOSIS — E1165 Type 2 diabetes mellitus with hyperglycemia: Secondary | ICD-10-CM

## 2023-08-04 DIAGNOSIS — R54 Age-related physical debility: Secondary | ICD-10-CM

## 2023-08-04 DIAGNOSIS — Z9981 Dependence on supplemental oxygen: Secondary | ICD-10-CM

## 2023-08-04 DIAGNOSIS — M48061 Spinal stenosis, lumbar region without neurogenic claudication: Secondary | ICD-10-CM

## 2023-08-04 DIAGNOSIS — R911 Solitary pulmonary nodule: Secondary | ICD-10-CM

## 2023-08-04 DIAGNOSIS — H40003 Preglaucoma, unspecified, bilateral: Secondary | ICD-10-CM

## 2023-08-04 DIAGNOSIS — R4189 Other symptoms and signs involving cognitive functions and awareness: Secondary | ICD-10-CM

## 2023-08-04 DIAGNOSIS — R1319 Other dysphagia: Secondary | ICD-10-CM

## 2023-08-04 DIAGNOSIS — K219 Gastro-esophageal reflux disease without esophagitis: Secondary | ICD-10-CM

## 2023-08-04 DIAGNOSIS — G4733 Obstructive sleep apnea (adult) (pediatric): Secondary | ICD-10-CM

## 2023-08-04 DIAGNOSIS — J45909 Unspecified asthma, uncomplicated: Secondary | ICD-10-CM

## 2023-08-04 DIAGNOSIS — H547 Unspecified visual loss: Secondary | ICD-10-CM

## 2023-08-04 DIAGNOSIS — K760 Fatty (change of) liver, not elsewhere classified: Secondary | ICD-10-CM

## 2023-08-04 MED FILL — GEMTESA 75 MG PO TAB: 75 mg | ORAL | 30 days supply | Qty: 30 | Fill #4 | Status: CP

## 2023-08-04 MED FILL — FOLIC ACID 1 MG PO TAB: 1 mg | ORAL | 30 days supply | Qty: 120 | Fill #10 | Status: CP

## 2023-08-04 NOTE — Patient Instructions
Use over the counter artificial tears (NOT VISINE/CLEAR EYES). Preservative free tears are best. - examples, Systane / Refresh / Optive / Blink / store brand, 2-4x/day.  Avoid any drops that say "gets the red out."

## 2023-08-04 NOTE — Progress Notes
Previous Surgery/lasers  none  Medications  Allergies:   Allergies   Allergen Reactions    Amoxicillin-Pot Clavulanate HIVES, ITCHING and URTICARIA    Gemfibrozil HIVES    Morphine MENTAL STATUS CHANGES     Patient became unresponsive within 1 minute of 4 mg of morphine.  Did not become apneic.  Required narcan    Trazodone HALLUCINATIONS    Adhesive Tape (Rosins) RASH    Atorvastatin SEE COMMENTS     States it caused her to have a fatty liver.   Reaction: LFT elevation; Comment: LIPITOR  Reaction: LFT elevation; Comment: LIPITOR  Reaction: LFT elevation; Comment: LIPITOR  Reaction: LFT elevation; Comment: LIPITOR      Latex RASH    Bupropion SEE COMMENTS     Allergy recorded in SMS: WELLBUTRIN~Reactions: ABNORMAL SEIZUR    Cymbalta [Duloxetine] UNKNOWN    Levaquin [Levofloxacin] DIARRHEA      Drops: tears as needd         Assessment and Plan:       Glaucoma suspect of both eyes  HVF stable today - possible rim artifact  Not currently on drops  CCT thick, +FH glaucoma    Can consider KDB or iStent w/ [facility], but ok without    Continue to monitor off gtt    Cataract  VS - noticing more difficulty driving at night and small print  Increased glare    CE eval next visit +/- MIGS           NEXT VISIT   Cataract eval, BAT, DFE, IOP check, 4 weeks, OCT n and m OU     [place] Jensen, MD PGY-4    Narda Bonds, MD   [facility]      HPI:  Patient presents with:  Eye Problem: FUV for Hx of Glaucoma 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 Saw Dr. Lonna Cobb 10/2022 and might need prescription printed off from that visit.   Denies any eye pain but c/o discharge + itching, OS>OD. Also c/o occl redness.          Exam:  Base Eye Exam       Visual Acuity (Snellen - Linear)         Right Left    Dist sc 20/150 20/60 -1 slow    Dist ph sc 20/50               Visual Acuity Comments    Gls are broken             Tonometry (iCare Tonometer, 1:50 PM)         Right Left    Pressure 18 13              Tonometry #2 (Applanation, 2:10 PM)         Right Left    Pressure 19 15              Pachymetry (08/04/2023)         Right Left    Thickness 623 601              Pupils         Dark Shape React APD    Right 4 Round + None    Left 4 Round + None              Visual Fields    HVF 24-2 done today             Neuro/Psych  Oriented x3: Yes    Mood/Affect: Normal                  Slit Lamp and Fundus Exam       External Exam         Right Left    External Normal Normal              Slit Lamp Exam         Right Left    Lids/Lashes Meibomian gland dysfunction, Dermatochalasis - upper lid Meibomian gland dysfunction, Dermatochalasis - upper lid    Conjunctiva/Sclera Pinguecula Pinguecula    Cornea Clear Clear    Anterior Chamber Deep and quiet Deep and quiet    Iris Flat Flat    Lens 2+ Nuclear sclerosis, 3+ Cortical cataract in VA 2+ Nuclear sclerosis, 3+ Cortical cataract in VA    Anterior Vitreous Normal Normal              Fundus Exam         Right Left    Disc Sharp, healthy rim Sharp, healthy rim    C/D Ratio 0.65 0.7                    VISUAL FIELD, EXTEND          [facility] Automated exam type was used.     Right Eye  Threshold was 24-2. Strategy was SITA Standard. Reliability was good. Progression has been stable. Rim Artifact     Left Eye  Threshold was 24-2. Strategy was SITA Standard. Reliability was borderline. Progression has been stable. Findings location Superior, Inferior Rim Artifact     Notes  Possible rim artifact OU

## 2023-08-04 NOTE — Assessment & Plan Note
VS - noticing more difficulty driving at night and small print  Increased glare    CE eval next visit +/- MIGS

## 2023-08-04 NOTE — Assessment & Plan Note
HVF stable today - possible rim artifact  Not currently on drops  CCT thick, +FH glaucoma    Can consider KDB or iStent w/ [employer], but ok without    Continue to monitor off gtt

## 2023-08-07 ENCOUNTER — Encounter: Admit: 2023-08-07 | Discharge: 2023-08-07 | Payer: MEDICARE

## 2023-08-08 ENCOUNTER — Encounter: Admit: 2023-08-08 | Discharge: 2023-08-08 | Payer: MEDICARE

## 2023-08-08 MED ORDER — LANTUS SOLOSTAR U-100 INSULIN 100 UNIT/ML (3 ML) SC INPN
SUBCUTANEOUS | 5 refills | 68.00000 days | Status: AC
Start: 2023-08-08 — End: ?
  Filled 2023-08-08: 30d supply

## 2023-08-12 ENCOUNTER — Encounter: Admit: 2023-08-12 | Discharge: 2023-08-12 | Payer: MEDICARE

## 2023-08-12 NOTE — Telephone Encounter
 Received request from DME for provider to sign form necessary for billing. CMN for CPAP & supplies. Form completed and given to provider for signature.    Form will be faxed to  [facility].

## 2023-08-13 ENCOUNTER — Encounter: Admit: 2023-08-13 | Discharge: 2023-08-13 | Payer: MEDICARE

## 2023-08-13 MED FILL — LANTUS SOLOSTAR U-100 INSULIN 100 UNIT/ML (3 ML) SC INPN: 100 unit/mL (3 mL) | SUBCUTANEOUS | 30 days supply | Qty: 30 | Fill #1 | Status: AC

## 2023-08-18 ENCOUNTER — Encounter: Admit: 2023-08-18 | Discharge: 2023-08-18 | Payer: MEDICARE

## 2023-08-19 ENCOUNTER — Encounter: Admit: 2023-08-19 | Discharge: 2023-08-19 | Payer: MEDICARE

## 2023-08-21 ENCOUNTER — Encounter: Admit: 2023-08-21 | Discharge: 2023-08-21 | Payer: MEDICARE

## 2023-08-22 ENCOUNTER — Encounter: Admit: 2023-08-22 | Discharge: 2023-08-22 | Payer: MEDICARE

## 2023-08-22 MED FILL — METHOTREXATE SODIUM (PF) 25 MG/ML IJ SOLN: 25 mg/mL | SUBCUTANEOUS | 28 days supply | Qty: 8 | Fill #3 | Status: CP

## 2023-08-22 MED FILL — CELECOXIB 200 MG PO CAP: 200 mg | ORAL | 30 days supply | Qty: 60 | Fill #2 | Status: CP

## 2023-08-22 MED FILL — MUPIROCIN 2 % TP OINT: 2 % | TOPICAL | 22 days supply | Qty: 22 | Fill #2 | Status: CP

## 2023-08-22 MED FILL — RXAMB NALTREXONE 1.5 MG ORAL CAPSULE (BATCHED COMPOUND): ORAL | 30 days supply | Qty: 0.05 | Fill #3 | Status: CP

## 2023-08-23 ENCOUNTER — Encounter: Admit: 2023-08-23 | Discharge: 2023-08-23 | Payer: MEDICARE

## 2023-08-23 MED FILL — INSULIN ASPART 100 UNIT/ML SC FLEXPEN: 100 unit/mL (3 mL) | SUBCUTANEOUS | 25 days supply | Qty: 30 | Fill #4 | Status: CP

## 2023-08-26 ENCOUNTER — Encounter: Admit: 2023-08-26 | Discharge: 2023-08-26 | Payer: MEDICARE

## 2023-08-26 MED ORDER — PREGABALIN 25 MG PO CAP
25 mg | ORAL_CAPSULE | Freq: Three times a day (TID) | ORAL | 5 refills | Status: AC
Start: 2023-08-26 — End: ?
  Filled 2023-09-03: qty 90, 30d supply, fill #1

## 2023-08-26 MED ORDER — PANTOPRAZOLE 40 MG PO TBEC
40 mg | ORAL_TABLET | Freq: Every day | ORAL | 3 refills | 90.00000 days | Status: AC
Start: 2023-08-26 — End: ?
  Filled 2023-09-03: qty 90, 90d supply, fill #1

## 2023-08-27 ENCOUNTER — Encounter: Admit: 2023-08-27 | Discharge: 2023-08-27 | Payer: MEDICARE

## 2023-08-29 ENCOUNTER — Encounter: Admit: 2023-08-29 | Discharge: 2023-08-29 | Payer: MEDICARE

## 2023-08-29 MED ORDER — PEN NEEDLE, DIABETIC 32 GAUGE X 5/32" MISC NDLE
0 refills
Start: 2023-08-29 — End: ?

## 2023-08-31 ENCOUNTER — Encounter: Admit: 2023-08-31 | Discharge: 2023-08-31 | Payer: MEDICARE

## 2023-09-01 ENCOUNTER — Encounter: Admit: 2023-09-01 | Discharge: 2023-09-01 | Payer: MEDICARE

## 2023-09-01 MED ORDER — FOLIC ACID 1 MG PO TAB
4 mg | ORAL_TABLET | Freq: Every day | ORAL | 3 refills | Status: AC
Start: 2023-09-01 — End: ?
  Filled 2023-09-15: qty 120, 30d supply, fill #1

## 2023-09-03 ENCOUNTER — Ambulatory Visit: Admit: 2023-09-03 | Discharge: 2023-09-03 | Payer: MEDICARE

## 2023-09-03 ENCOUNTER — Encounter: Admit: 2023-09-03 | Discharge: 2023-09-03 | Payer: MEDICARE

## 2023-09-03 DIAGNOSIS — Z79899 Other long term (current) drug therapy: Secondary | ICD-10-CM

## 2023-09-03 DIAGNOSIS — K760 Fatty (change of) liver, not elsewhere classified: Secondary | ICD-10-CM

## 2023-09-03 DIAGNOSIS — R768 Other specified abnormal immunological findings in serum: Secondary | ICD-10-CM

## 2023-09-03 DIAGNOSIS — E034 Atrophy of thyroid (acquired): Secondary | ICD-10-CM

## 2023-09-03 DIAGNOSIS — E119 Type 2 diabetes mellitus without complications: Secondary | ICD-10-CM

## 2023-09-03 DIAGNOSIS — J309 Allergic rhinitis, unspecified: Secondary | ICD-10-CM

## 2023-09-03 DIAGNOSIS — I2510 Atherosclerotic heart disease of native coronary artery without angina pectoris: Secondary | ICD-10-CM

## 2023-09-03 DIAGNOSIS — K219 Gastro-esophageal reflux disease without esophagitis: Secondary | ICD-10-CM

## 2023-09-03 DIAGNOSIS — G4733 Obstructive sleep apnea (adult) (pediatric): Secondary | ICD-10-CM

## 2023-09-03 DIAGNOSIS — R1319 Other dysphagia: Secondary | ICD-10-CM

## 2023-09-03 DIAGNOSIS — M1990 Unspecified osteoarthritis, unspecified site: Secondary | ICD-10-CM

## 2023-09-03 DIAGNOSIS — E039 Hypothyroidism, unspecified: Secondary | ICD-10-CM

## 2023-09-03 DIAGNOSIS — E785 Hyperlipidemia, unspecified: Secondary | ICD-10-CM

## 2023-09-03 DIAGNOSIS — I5030 Unspecified diastolic (congestive) heart failure: Secondary | ICD-10-CM

## 2023-09-03 DIAGNOSIS — R54 Age-related physical debility: Secondary | ICD-10-CM

## 2023-09-03 DIAGNOSIS — H547 Unspecified visual loss: Secondary | ICD-10-CM

## 2023-09-03 DIAGNOSIS — R3989 Other symptoms and signs involving the genitourinary system: Secondary | ICD-10-CM

## 2023-09-03 DIAGNOSIS — C801 Malignant (primary) neoplasm, unspecified: Secondary | ICD-10-CM

## 2023-09-03 DIAGNOSIS — Z5181 Encounter for therapeutic drug level monitoring: Secondary | ICD-10-CM

## 2023-09-03 DIAGNOSIS — J40 Bronchitis, not specified as acute or chronic: Secondary | ICD-10-CM

## 2023-09-03 DIAGNOSIS — R4189 Other symptoms and signs involving cognitive functions and awareness: Secondary | ICD-10-CM

## 2023-09-03 DIAGNOSIS — E1165 Type 2 diabetes mellitus with hyperglycemia: Secondary | ICD-10-CM

## 2023-09-03 DIAGNOSIS — M0579 Rheumatoid arthritis with rheumatoid factor of multiple sites without organ or systems involvement: Secondary | ICD-10-CM

## 2023-09-03 DIAGNOSIS — M7918 Myalgia, other site: Secondary | ICD-10-CM

## 2023-09-03 DIAGNOSIS — R413 Other amnesia: Secondary | ICD-10-CM

## 2023-09-03 DIAGNOSIS — F445 Conversion disorder with seizures or convulsions: Secondary | ICD-10-CM

## 2023-09-03 DIAGNOSIS — U071 COVID-19 virus infection: Secondary | ICD-10-CM

## 2023-09-03 DIAGNOSIS — Z9981 Dependence on supplemental oxygen: Secondary | ICD-10-CM

## 2023-09-03 DIAGNOSIS — R911 Solitary pulmonary nodule: Secondary | ICD-10-CM

## 2023-09-03 DIAGNOSIS — G479 Sleep disorder, unspecified: Secondary | ICD-10-CM

## 2023-09-03 DIAGNOSIS — R55 Syncope and collapse: Secondary | ICD-10-CM

## 2023-09-03 DIAGNOSIS — E7849 Other hyperlipidemia: Secondary | ICD-10-CM

## 2023-09-03 DIAGNOSIS — R32 Unspecified urinary incontinence: Secondary | ICD-10-CM

## 2023-09-03 DIAGNOSIS — M4802 Spinal stenosis, cervical region: Secondary | ICD-10-CM

## 2023-09-03 DIAGNOSIS — R29898 Other symptoms and signs involving the musculoskeletal system: Secondary | ICD-10-CM

## 2023-09-03 DIAGNOSIS — G629 Polyneuropathy, unspecified: Secondary | ICD-10-CM

## 2023-09-03 DIAGNOSIS — D849 Immunodeficiency, unspecified: Secondary | ICD-10-CM

## 2023-09-03 DIAGNOSIS — K5790 Diverticulosis of intestine, part unspecified, without perforation or abscess without bleeding: Secondary | ICD-10-CM

## 2023-09-03 DIAGNOSIS — M159 Polyosteoarthritis, unspecified: Secondary | ICD-10-CM

## 2023-09-03 DIAGNOSIS — G259 Extrapyramidal and movement disorder, unspecified: Secondary | ICD-10-CM

## 2023-09-03 DIAGNOSIS — M069 Rheumatoid arthritis, unspecified: Secondary | ICD-10-CM

## 2023-09-03 DIAGNOSIS — I7300 Raynaud's syndrome without gangrene: Secondary | ICD-10-CM

## 2023-09-03 DIAGNOSIS — G47419 Narcolepsy without cataplexy: Secondary | ICD-10-CM

## 2023-09-03 DIAGNOSIS — J45909 Unspecified asthma, uncomplicated: Secondary | ICD-10-CM

## 2023-09-03 DIAGNOSIS — M48061 Spinal stenosis, lumbar region without neurogenic claudication: Secondary | ICD-10-CM

## 2023-09-03 DIAGNOSIS — E669 Obesity, unspecified: Secondary | ICD-10-CM

## 2023-09-03 DIAGNOSIS — F3341 Major depressive disorder, recurrent, in partial remission: Secondary | ICD-10-CM

## 2023-09-03 LAB — LDH-LACTATE DEHYDROGENASE: LDH: 276 U/L — ABNORMAL HIGH (ref 100–210)

## 2023-09-03 LAB — CBC AND DIFF
ABSOLUTE LYMPH COUNT: 1 10*3/uL (ref 1.0–4.8)
BASOPHILS %: 1 % (ref 0–2)
HEMATOCRIT: 38 % (ref 36–45)
HEMOGLOBIN: 12 g/dL (ref 12.0–15.0)
LYMPHOCYTES %: 17 % — ABNORMAL LOW (ref 24–44)
MCH: 29 pg (ref 26–34)
MCV: 88 FL (ref 80–100)
MONOCYTES %: 10 % (ref 4–12)
MPV: 8.1 FL (ref 7–11)
NEUTROPHILS %: 67 % (ref 41–77)
RBC COUNT: 4.3 M/UL (ref 4.0–5.0)
RDW: 14 % (ref 11–15)
WBC COUNT: 6.5 10*3/uL (ref 4.5–11.0)

## 2023-09-03 LAB — COMPREHENSIVE METABOLIC PANEL
ALBUMIN: 4.3 g/dL (ref 3.5–5.0)
ALK PHOSPHATASE: 106 U/L (ref 25–110)
ALT: 36 U/L (ref 7–56)
ANION GAP: 12 (ref 3–12)
AST: 43 U/L — ABNORMAL HIGH (ref 7–40)
BLD UREA NITROGEN: 28 mg/dL — ABNORMAL HIGH (ref 7–25)
CALCIUM: 10 mg/dL (ref 8.5–10.6)
CHLORIDE: 104 MMOL/L (ref 98–110)
CO2: 25 MMOL/L (ref 21–30)
CREATININE: 0.7 mg/dL (ref 0.4–1.00)
GLUCOSE,PANEL: 149 mg/dL — ABNORMAL HIGH (ref 70–100)
POTASSIUM: 4.1 MMOL/L (ref 3.5–5.1)
SODIUM: 141 MMOL/L (ref 137–147)
TOTAL BILIRUBIN: 0.4 mg/dL (ref 0.2–1.3)
TOTAL PROTEIN: 7.2 g/dL (ref 6.0–8.0)

## 2023-09-03 LAB — SED RATE: ESR: 25 mm/h (ref 0–30)

## 2023-09-03 LAB — CREATINE KINASE-CPK: CK TOTAL: 434 U/L — ABNORMAL HIGH (ref 21–215)

## 2023-09-03 LAB — C REACTIVE PROTEIN (CRP): C-REACTIVE PROTEIN: 0.3 mg/dL (ref <1.0)

## 2023-09-03 NOTE — Patient Instructions
Get labs today    Schedule MRI to evaluation for disease in your hands.  Please schedule your MRI by calling radiology at  [phone], option 1     Continue your current medication regimen    Have your daughter message in your dose of naltrexone    Followup in clinic in 3 months

## 2023-09-03 NOTE — Progress Notes
Subjective:       History of Present Illness  KRISTENA BALIAN is a 72 y.o. 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      Patient is a Caucasian female with history of DM2, hypothyroidism, pseudoseizure and depression who presented to the hospital in 09/2020 after a fall and joint pain. Affected joints include hands and wrists (wrists radiating down to her fingertips with the right hand more than the left hand), right shoulder, left knee, and the bottoms of her heels. Pain had inflammatory characteristics by history (warmth and swelling). On physical exam, patient had synovitis on physical exam and possibly tenosynovitis in the wrists. Patient also had diffuse swelling in both hands. Labs showed an elevated sedimentation rate (43) and CRP (1.98 mg/dL). Labs also showed elevated rheumatoid factor (>1200) and CCP antibody (230.3). Right hand Xray, bilateral wrist Xrays, and bilateral knee Xrays showed degenerative changes but no evidence of inflammatory arthritis. Patient initiated on prednisone and methotrexate. As patient was still having a significant amount of pain initiating on methotrexate, patient was also started on Humira.     In 04/2022.  Her clinical disease activity index was 28 that visit.  She had been on Orencia subcutaneous since 12/2021 without any benefit. Previously noted findings of fibrosis when she was hospitalized for urosepsis (CT results were shown to [place] via her phone at that visit) were not seen on repeat HRCT that visit. Raynaud's was stable, not known to have features of systemic sclerosis or inflammatory myositis in the past. Discussed to either switching abatacept infusion vs rituximab. With shared decision making, prefer to save rituximab as last reserve. Can finish remaining abatacept SQ until can start abatacept infusion (patient weighs 97.1 kg so we used dosing for 60 to 100 kg: 750 mg (0, 2 weeks and 4 weeks after the initial infusion, and every 4 weeks thereafter).  Methotrexate 20 mg p.o. once weekly was continued.      IMAGING/SEROLOGIES:  Positive: CCP (>300), RF (>1200)    Negative/normal: Cardiolipin, SPEP, immunofixation, dsDNA, antimitochondrial, anti-Smith, anti-RNP, anti-SSA/SSB, centromere, MPO, PR-3, C3/C4, Jo 1, SCL 70, RNA polymerase 3, beta-2 glycoprotein, mild marker panel    08/2022 hand x-ray bilateral: No erosions or costochondral cells.  Scattered degenerative arthritis in both hands most severe at the first left [facility] joint with recommendation of the trapezium and medial subluxation of the first metacarpal.  Moderate degenerative changes in the right first [facility] joint. Otherwise mild scattered degenerative changes in both hands    01/2023 foot x-ray bilateral.  No acute fracture or malalignment of either foot.  No aggressive bony destructive process.  Achilles and plantar calcaneal enthesophytes bilaterally    PFT 02/2023  The FEV1 and FVC are reduced.  The FEV1/FVC ratio and FEF25-75% are normal.   Residual volume is decreased.   Total lung capacity (performed by body plethysmography) is moderately decreased.   Transport factor (Diffusion Capacity) not corrected for hemoglobin is normal.   RV/TLC is normal.   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    EXTRA-ARTICULAR MANIFESTATIONS:  Patient established with Doctor Lorna Few for RA-ILD/2024.     PREVIOUS MEDICATIONS:  Humira 40 mg SQ q14d then has tried q7d (11/2020 - 05/2021) she continued to have flares   Enbrel 50 mg SQ weekly 06/2021 - 09/2021 - ineffective   Abatacept 125mg  SQ weekly 09/2021 -06/2022, ineffective   abatacept infusion (patient  weighs 97.1 kg so we used dosing for 60 to 100 kg: 750 mg (0, 2 weeks and 4 weeks after the initial infusion, and every 4 weeks thereafter), first dose 07/22/2022. 01/08/23 last abatacept infusion.    CURRENT MEDICATIONS:  Rituximab infusions 1 g days 1 and 15 every 6 months 03/03/23 and 03/17/2023, moved to every 4 months starting in 06/2023 due to RA flares  Methotrexate 20 mg by mouth weekly (10/2020 - 05/2023).  Increase to 25 mg subcutaneous in 05/2023  Folic acid 4 mg daily (was having oral sores)  Of note has history of diverticulitis     Interval history:  Last seen in June 2024.  At that time was on rituximab with considerations of increasing to 1 g every 4 months.  She was started on low-dose naltrexone for her myofascial pain syndrome.    Was seen in hospital in 05/2023 at which time the consult team saw patient for RA flare.  She was discharged home with a 3-week prednisone taper.  Her methotrexate was increased to 25 mg subcutaneous.  Last rituximab infusion 06/2019    She continues to have swelling in the bilateral hands, wrist swelling.  There is tenderness present in her hands.  She has minimal grip strength.    She has bilateral knee and hip pains that is constant through the day.  She feels that she can be active for 5 minutes before having to sit down.      She feels that her pain is constant through the day and she can not be active.  She feels that there was minimal benefit with the rituximab and switch to the subQ methotrexate    Past medical history:  Type 2 diabetes, nonalcoholic fatty liver disease, history of diverticulitis (remote), hypothyroidism, pseudoseizure, obstructive sleep apnea, loop recorder present, osteoarthritis multiple joints        Review of Systems  Negative unless otherwise specified above.    Objective:          acetaminophen (TYLENOL EXTRA STRENGTH) 500 mg tablet Take two tablets by mouth every 6 hours as needed. Max of 4,000 mg of acetaminophen in 24 hours.  Indications: pain    aspirin 81 mg chewable tablet Chew one tablet by mouth at bedtime daily.    baclofen (LIORESAL) 10 mg tablet Take one tablet by mouth three times daily as needed for Muscle Cramps or Pain. 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 ICD-10: Type 2 diabetes with hyperglycemia, insulin use E11.65, Z79.4  Indications: type 2 diabetes mellitus    blood-glucose meter kit ICD-10: Type 2 diabetes with hyperglycemia, insulin use E11.65, Z79.4  Indications: type 2 diabetes mellitus    bumetanide (BUMEX) 1 mg tablet Take one tablet by mouth daily.    calcium carbonate (CALTRATE 600) 600 mg calcium (1,500 mg) tablet Take two tablets by mouth twice daily.    celecoxib (CELEBREX) 200 mg capsule Take one capsule by mouth twice daily. Indications: rheumatoid arthritis    CHOLEcalciferoL (vitamin D3) (OPTIMAL D3) 50,000 units capsule Take 1 capsule by mouth once weekly    cyclobenzaprine (FLEXERIL) 5 mg tablet Take one tablet by mouth three times daily as needed for muscle spasms.    diclofenac sodium (ARTHRITIS PAIN (DICLOFENAC)) 1 % topical gel Apply four g topically to affected area three times daily.    docusate (COLACE) 100 mg capsule Take two capsules  by mouth twice daily.    famotidine (PEPCID) 40 mg tablet Take one tablet by mouth daily.    ferrous sulfate (FEOSOL) 325 mg (65 mg iron) tablet Take one tablet by mouth twice daily. Take on an empty stomach at least 1 hour before or 2 hours after food.    fexofenadine(+) (ALLEGRA) 180 mg tablet Take one tablet by mouth daily.    fluocinolone acetonide oil (DERMOTIC OIL) 0.01 % otic drops INSTILL 3 DROPS INTO NOSE DAILY AS NEEDED    folic acid (FOLVITE) 1 mg tablet Take four tablets by mouth daily.    insulin aspart (U-100) (NOVOLOG FLEXPEN U-100 INSULIN) 100 unit/mL (3 mL) PEN Inject thirty Units under the skin three times daily with meals with a sliding scale 201-250 = +3 units, 251-300 = +6 units, greater than 300 = +9 units, Total Daily units : 117 units    JARDIANCE 25 mg tablet Take one tablet by mouth daily.    lactobacillus rhamnosus GG (CULTURELLE) 15 billion cell capsule Take one capsule by mouth as directed daily.    lancets 33 gauge 33 gauge Use one each as directed before meals and at bedtime. ICD-10: Type 2 diabetes with hyperglycemia, insulin use E11.65, Z79.4  Indications: type 2 diabetes mellitus    LANTUS SOLOSTAR U-100 INSULIN 100 unit/mL (3 mL) subcutaneous PEN Inject fifty Units under the skin every morning AND fifty Units at bedtime daily.    levothyroxine (SYNTHROID) 88 mcg tablet Take one tablet by mouth every morning.    methotrexate PF 25 mg/mL injection Inject 1 mL under the skin every 7 days. Patient to stop oral methotrexate once subcutaneous methotrexate formulation is approved and started. Single use vials. Only use for one dose ,then discard.    methotrexate sodium 2.5 mg tablet Take eight tablets by mouth every 7 days.    mupirocin (BACTROBAN) 2 % topical ointment Apply 1 application in the nostrils twice a day    naltrexone 1.5 mg oral capsule (BATCHED COMPOUND) Take one capsule by mouth daily.    nitroglycerin (NITROSTAT) 0.4 mg tablet Place one tablet under tongue every 5 minutes as needed for Chest Pain. Max 3 tablets, call 911.    nystatin (MYCOSTATIN) 100,000 unit/g topical cream Apply  topically to affected area twice daily as needed.    ondansetron (ZOFRAN ODT) 8 mg rapid dissolve tablet Dissolve one tablet by mouth every 8 hours as needed.    pantoprazole DR (PROTONIX) 40 mg tablet Take one tablet by mouth daily.    pantoprazole DR (PROTONIX) 40 mg tablet Take one tablet by mouth twice daily.    pen needle, diabetic (BD NANO 2ND GEN PEN NEEDLE) 32 gauge x 5/32 pen needle Use  as directed 5 times daily    polyethylene glycol 3350 (MIRALAX) 17 g packet Take one packet by mouth twice daily as needed.    pramipexole (MIRAPEX) 0.75 mg tablet Take one tablet by mouth daily.    pregabalin (LYRICA) 25 mg capsule Take one capsule by mouth three times daily.    rosuvastatin (CRESTOR) 20 mg tablet Take one tablet by mouth daily.    sennosides-docusate sodium (SENNA-S) 8.6/50 mg tablet Take one tablet by mouth daily.    spironolactone (ALDACTONE) 25 mg tablet Take one tablet by mouth daily. Take with food.    triamcinolone acetonide 0.5 % ointment Apply  topically to affected area three times daily as needed.    TRULICITY 3 mg/0.5 mL injection pen     venlafaxine XR (  EFFEXOR XR) 150 mg capsule Take one capsule by mouth daily.    vibegron (GEMTESA) 75 mg tablet Take one tablet by mouth at bedtime daily.    vitamins, multi w/minerals 9 mg iron-400 mcg tab Take one tablet by mouth daily.     Vitals:    09/03/23 1448   BP: 136/62   Pulse: 88   Temp: 36.7 ?C (98 ?F)   Resp: 18   SpO2: 97%   PainSc: Nine   Weight: 96.6 kg (213 lb)   Height: 167.6 cm (5' 6)     Body mass index is 34.38 kg/m?[name]     Physical Exam  Constitutional: A&Ox3, NAD  HEENT: at/nc, EOMI, sclera normal, MMM  Cardiac: RRR, no murmurs noted   Pulm: CTAB, no wheeze, rales or rhonchi   Abd: soft, non tender, non distended,   Neuro: No focal deficits, motor and sensory intact  Ext: Stasis dermatitis present from mid shin and distally.  There is diffuse edema present in bilateral subcutaneous edema present in bilateral hands and feet.  Skin: Bilateral shins with stasis dermatitis changes, noted to be mildly erythematous and warm to touch  MSK: Difficult to test for synovitis but no clear joint swelling but range of motion particularly in the ankle remains intact.  Overall she has multiple tender joints present, please see homunculus.  She has widespread myofascial tenderness, particularly in the upper arms  Physical Exam          Comprehensive Metabolic Profile    Lab Results   Component Value Date/Time    NA 138 06/13/2023 06:09 AM    K 3.9 06/13/2023 06:09 AM    CL 101 06/13/2023 06:09 AM    CO2 29 06/13/2023 06:09 AM    GAP 8 06/13/2023 06:09 AM    BUN 22 06/13/2023 06:09 AM    CR 0.72 07/18/2023 12:40 PM    GLU 228 (H) 06/13/2023 06:09 AM    GLU 90 11/24/2004 06:10 PM    Lab Results   Component Value Date/Time    CA 9.1 06/13/2023 06:09 AM    PO4 4.6 (H) 06/10/2023 01:04 PM    ALBUMIN 4.1 06/10/2023 01:04 PM    TOTPROT 6.9 06/10/2023 01:04 PM    ALKPHOS 109 06/10/2023 01:04 PM    AST 28 06/10/2023 01:04 PM    ALT 27 07/18/2023 12:40 PM    TOTBILI 0.5 06/10/2023 01:04 PM    GFR >60 11/11/2020 07:21 AM    GFRAA >60 11/11/2020 07:21 AM        CBC w diff    Lab Results   Component Value Date/Time    WBC 6.5 09/03/2023 03:59 PM    RBC 4.37 09/03/2023 03:59 PM    HGB 12.7 09/03/2023 03:59 PM    HCT 38.6 09/03/2023 03:59 PM    MCV 88.2 09/03/2023 03:59 PM    MCH 29.1 09/03/2023 03:59 PM    MCHC 33.0 09/03/2023 03:59 PM    RDW 14.5 09/03/2023 03:59 PM    PLTCT 251 09/03/2023 03:59 PM    MPV 8.1 09/03/2023 03:59 PM    Lab Results   Component Value Date/Time    NEUT 67 09/03/2023 03:59 PM    ANC 4.40 09/03/2023 03:59 PM    LYMA 17 (L) 09/03/2023 03:59 PM    ALC 1.07 09/03/2023 03:59 PM    MONA 10 09/03/2023 03:59 PM    AMC 0.67 09/03/2023 03:59 PM    EOSA 5 09/03/2023 03:59 PM  AEC 0.31 09/03/2023 03:59 PM    BASA 1 09/03/2023 03:59 PM    ABC 0.05 09/03/2023 03:59 PM            Assessment:  Encounter Diagnoses   Name Primary?    Rheumatoid arthritis involving multiple sites with positive rheumatoid factor (HCC) Yes    High risk medication use     Myofascial pain dysfunction syndrome     Positive ANA (antinuclear antibody)     Raynaud's phenomenon without gangrene     Therapeutic drug monitoring     Immunosuppressed status (HCC)     Heart failure with preserved ejection fraction, unspecified HF chronicity (HCC)     Rheumatoid arthritis involving multiple sites, unspecified whether rheumatoid factor present (HCC)     Primary osteoarthritis involving multiple joints      Floss presents to clinic today for follow-up of her seropositive (rheumatoid factor >1200 and CCP antibody >300), nonerosive rheumatoid arthritis.  She continues to have widespread joint tenderness and myofascial discomfort that she believes has worsened in the last couple months.  She was recently hospitalized for RA flare which at that time her rituximab infusion frequency was increased to every 4 months.  She had previously been on infusions day 1 and 15 every 6 months.  Her methotrexate was also increased from 20 mg oral to 25 mg subcutaneous.    On exam today she appeared to have widespread edema with dermatitis stasis changes on the bilateral lower extremities.  Given her edema present, some of her joint discomfort may be related to her volume status and skin tightness rather than uncontrolled inflammatory disease.  She will touch base with her cardiology as well as I about her diuretic use. She is on essentially on maximized RA treatment and continues to have symptoms  Will obtain an MRI to assess for underlying synovitis and active inflammatory disease given that I would be cautious to increase her immunosuppression.  Her rheumatoid arthritis is potentially refractory and we will obtain labs to rule out other causes such as a paraneoplastic syndrome.    With regards to her fibromyalgia and myofascial pain, she currently is taking naltrexone but is unclear of the dose.  She is can have her daughter call and with her current dosing.  At that time I will adjust accordingly as she believes she is only on 1.5 mg and can be increased by 1.5 mg every 1 to 2 weeks with the max dosage of 25 mg.  On exam she at times did display potential depression so we will need to screen her before increasing the dosage and may actually lead me to discontinue the medication if she displays severe depression or suicidal ideation.  We also discussed the importance of her CPAP use as her most recent CPAP use was very infrequent and may be contributing to some of her fatigue.    Plan:  Obtain labs today as below.  If labs stable can increase DMARD interval monitoring to every 3 months  Continue methotrexate 25 mg subcutaneous weekly  Continue rituximab infusions every 4 months  Discussed importance of wearing CPAP at night which may benefit overall fatigue  5.    For her myofascial pain, continue naltrexone.  Will adjust medication when daughter is able to MyChart in her current dosage.  Will need to screen her for depression and suicidal ideation prior to increasing her medication dose.  6.   MRI of the right wrist and hand to evaluate for synovitis  7.  Discussed she need to be seen/discussed volume status with her cardiology team about potentially increasing her diuretics      Orders Placed This Encounter    MRI UPPER EXTREM WO/W CONT LT    COMPREHENSIVE METABOLIC PANEL    SED RATE today    C REACTIVE PROTEIN (CRP) today    ALDOLASE today    CREATINE KINASE-CPK [today    LDH-LACTATE DEHYDROGENASE    PERIPHERAL SMEAR today    25-OH VITAMIN D (D2 + D3)        Follow up in 3 months      Gaye Alken, DO  Rheumatology Fellow, PGY-4    Patient was seen and plan discussed with Dr. Mady Haagensen

## 2023-09-08 ENCOUNTER — Ambulatory Visit: Admit: 2023-09-08 | Discharge: 2023-09-08 | Payer: MEDICARE

## 2023-09-08 ENCOUNTER — Encounter: Admit: 2023-09-08 | Discharge: 2023-09-08 | Payer: MEDICARE

## 2023-09-08 DIAGNOSIS — M069 Rheumatoid arthritis, unspecified: Secondary | ICD-10-CM

## 2023-09-08 DIAGNOSIS — M4802 Spinal stenosis, cervical region: Secondary | ICD-10-CM

## 2023-09-08 DIAGNOSIS — E785 Hyperlipidemia, unspecified: Secondary | ICD-10-CM

## 2023-09-08 DIAGNOSIS — Z9981 Dependence on supplemental oxygen: Secondary | ICD-10-CM

## 2023-09-08 DIAGNOSIS — K219 Gastro-esophageal reflux disease without esophagitis: Secondary | ICD-10-CM

## 2023-09-08 DIAGNOSIS — R54 Age-related physical debility: Secondary | ICD-10-CM

## 2023-09-08 DIAGNOSIS — G4733 Obstructive sleep apnea (adult) (pediatric): Secondary | ICD-10-CM

## 2023-09-08 DIAGNOSIS — R413 Other amnesia: Secondary | ICD-10-CM

## 2023-09-08 DIAGNOSIS — M1990 Unspecified osteoarthritis, unspecified site: Secondary | ICD-10-CM

## 2023-09-08 DIAGNOSIS — G47419 Narcolepsy without cataplexy: Secondary | ICD-10-CM

## 2023-09-08 DIAGNOSIS — G479 Sleep disorder, unspecified: Secondary | ICD-10-CM

## 2023-09-08 DIAGNOSIS — R3989 Other symptoms and signs involving the genitourinary system: Secondary | ICD-10-CM

## 2023-09-08 DIAGNOSIS — J40 Bronchitis, not specified as acute or chronic: Secondary | ICD-10-CM

## 2023-09-08 DIAGNOSIS — E7849 Other hyperlipidemia: Secondary | ICD-10-CM

## 2023-09-08 DIAGNOSIS — M48061 Spinal stenosis, lumbar region without neurogenic claudication: Secondary | ICD-10-CM

## 2023-09-08 DIAGNOSIS — R55 Syncope and collapse: Secondary | ICD-10-CM

## 2023-09-08 DIAGNOSIS — E669 Obesity, unspecified: Secondary | ICD-10-CM

## 2023-09-08 DIAGNOSIS — R1319 Other dysphagia: Secondary | ICD-10-CM

## 2023-09-08 DIAGNOSIS — R29898 Other symptoms and signs involving the musculoskeletal system: Secondary | ICD-10-CM

## 2023-09-08 DIAGNOSIS — E1165 Type 2 diabetes mellitus with hyperglycemia: Secondary | ICD-10-CM

## 2023-09-08 DIAGNOSIS — I2510 Atherosclerotic heart disease of native coronary artery without angina pectoris: Secondary | ICD-10-CM

## 2023-09-08 DIAGNOSIS — H547 Unspecified visual loss: Secondary | ICD-10-CM

## 2023-09-08 DIAGNOSIS — R32 Unspecified urinary incontinence: Secondary | ICD-10-CM

## 2023-09-08 DIAGNOSIS — U071 COVID-19 virus infection: Secondary | ICD-10-CM

## 2023-09-08 DIAGNOSIS — Z79899 Other long term (current) drug therapy: Secondary | ICD-10-CM

## 2023-09-08 DIAGNOSIS — K5790 Diverticulosis of intestine, part unspecified, without perforation or abscess without bleeding: Secondary | ICD-10-CM

## 2023-09-08 DIAGNOSIS — J45909 Unspecified asthma, uncomplicated: Secondary | ICD-10-CM

## 2023-09-08 DIAGNOSIS — E119 Type 2 diabetes mellitus without complications: Secondary | ICD-10-CM

## 2023-09-08 DIAGNOSIS — F3341 Major depressive disorder, recurrent, in partial remission: Secondary | ICD-10-CM

## 2023-09-08 DIAGNOSIS — R4189 Other symptoms and signs involving cognitive functions and awareness: Secondary | ICD-10-CM

## 2023-09-08 DIAGNOSIS — J309 Allergic rhinitis, unspecified: Secondary | ICD-10-CM

## 2023-09-08 DIAGNOSIS — G629 Polyneuropathy, unspecified: Secondary | ICD-10-CM

## 2023-09-08 DIAGNOSIS — G259 Extrapyramidal and movement disorder, unspecified: Secondary | ICD-10-CM

## 2023-09-08 DIAGNOSIS — E039 Hypothyroidism, unspecified: Secondary | ICD-10-CM

## 2023-09-08 DIAGNOSIS — F445 Conversion disorder with seizures or convulsions: Secondary | ICD-10-CM

## 2023-09-08 DIAGNOSIS — R911 Solitary pulmonary nodule: Secondary | ICD-10-CM

## 2023-09-08 DIAGNOSIS — E034 Atrophy of thyroid (acquired): Secondary | ICD-10-CM

## 2023-09-08 DIAGNOSIS — C801 Malignant (primary) neoplasm, unspecified: Secondary | ICD-10-CM

## 2023-09-08 DIAGNOSIS — K760 Fatty (change of) liver, not elsewhere classified: Secondary | ICD-10-CM

## 2023-09-12 ENCOUNTER — Encounter: Admit: 2023-09-12 | Discharge: 2023-09-12 | Payer: MEDICARE

## 2023-09-12 MED ORDER — INSULIN ASPART 100 UNIT/ML SC FLEXPEN
30 [IU] | Freq: Three times a day (TID) | SUBCUTANEOUS | 3 refills
Start: 2023-09-12 — End: ?

## 2023-09-15 ENCOUNTER — Encounter: Admit: 2023-09-15 | Discharge: 2023-09-15 | Payer: MEDICARE

## 2023-09-15 MED ORDER — RXAMB NALTREXONE 1.5 MG ORAL CAPSULE (BATCHED COMPOUND)
1.5 mg | ORAL_CAPSULE | Freq: Every day | ORAL | 2 refills
Start: 2023-09-15 — End: ?

## 2023-09-15 MED ORDER — VIBEGRON 75 MG PO TAB
75 mg | ORAL_TABLET | Freq: Every evening | ORAL | 3 refills
Start: 2023-09-15 — End: ?

## 2023-09-15 MED ORDER — LEVOTHYROXINE 88 MCG PO TAB
88 ug | ORAL_TABLET | Freq: Every morning | ORAL | 1 refills
Start: 2023-09-15 — End: ?

## 2023-09-15 MED ORDER — LEVOTHYROXINE 88 MCG PO TAB
88 ug | ORAL_TABLET | Freq: Every morning | ORAL | 1 refills | Status: CN
Start: 2023-09-15 — End: ?

## 2023-09-15 MED ORDER — ROSUVASTATIN 20 MG PO TAB
20 mg | ORAL_TABLET | Freq: Every day | ORAL | 3 refills | 90.00000 days | Status: AC
Start: 2023-09-15 — End: ?
  Filled 2023-10-03: qty 90, 90d supply, fill #1

## 2023-09-15 MED FILL — SPIRONOLACTONE 25 MG PO TAB: 25 mg | ORAL | 90 days supply | Qty: 90 | Fill #3 | Status: AC

## 2023-09-15 MED FILL — METHOTREXATE SODIUM (PF) 25 MG/ML IJ SOLN: 25 mg/mL | SUBCUTANEOUS | 28 days supply | Qty: 8 | Fill #4 | Status: AC

## 2023-09-15 NOTE — Telephone Encounter
09/03/23 LOV  Plan:  Obtain labs today as below.  If labs stable can increase DMARD interval monitoring to every 3 months  Continue methotrexate 25 mg subcutaneous weekly  Continue rituximab infusions every 4 months  Discussed importance of wearing CPAP at night which may benefit overall fatigue  5.    For her myofascial pain, continue naltrexone.  Will adjust medication when daughter is able to MyChart in her current dosage.  Will need to screen her for depression and suicidal ideation prior to increasing her medication dose.  6.   MRI of the right wrist and hand to evaluate for synovitis  7.  Discussed she need to be seen/discussed volume status with her cardiology team about potentially increasing her diuretics            Orders Placed This Encounter      Next appt 12/15/23   See mychart 09/08/23

## 2023-09-16 ENCOUNTER — Encounter: Admit: 2023-09-16 | Discharge: 2023-09-16 | Payer: MEDICARE

## 2023-09-18 ENCOUNTER — Encounter: Admit: 2023-09-18 | Discharge: 2023-09-18 | Payer: MEDICARE

## 2023-09-19 ENCOUNTER — Encounter: Admit: 2023-09-19 | Discharge: 2023-09-19 | Payer: MEDICARE

## 2023-09-19 MED FILL — INSULIN ASPART 100 UNIT/ML SC FLEXPEN: 100 unit/mL (3 mL) | SUBCUTANEOUS | 25 days supply | Qty: 30 | Fill #1 | Status: AC

## 2023-09-19 MED FILL — LANTUS SOLOSTAR U-100 INSULIN 100 UNIT/ML (3 ML) SC INPN: 100 unit/mL (3 mL) | SUBCUTANEOUS | 30 days supply | Qty: 30 | Fill #2 | Status: AC

## 2023-09-20 ENCOUNTER — Encounter: Admit: 2023-09-20 | Discharge: 2023-09-20 | Payer: MEDICARE

## 2023-09-21 MED FILL — GEMTESA 75 MG PO TAB: 75 mg | ORAL | 30 days supply | Qty: 30 | Fill #1 | Status: AC

## 2023-09-22 ENCOUNTER — Encounter: Admit: 2023-09-22 | Discharge: 2023-09-22 | Payer: MEDICARE

## 2023-09-23 ENCOUNTER — Encounter: Admit: 2023-09-23 | Discharge: 2023-09-23 | Payer: MEDICARE

## 2023-09-23 ENCOUNTER — Ambulatory Visit: Admit: 2023-09-23 | Discharge: 2023-09-24 | Payer: MEDICARE

## 2023-09-23 DIAGNOSIS — R55 Syncope and collapse: Secondary | ICD-10-CM

## 2023-09-23 DIAGNOSIS — R413 Other amnesia: Secondary | ICD-10-CM

## 2023-09-23 DIAGNOSIS — E669 Obesity, unspecified: Secondary | ICD-10-CM

## 2023-09-23 DIAGNOSIS — M1990 Unspecified osteoarthritis, unspecified site: Secondary | ICD-10-CM

## 2023-09-23 DIAGNOSIS — E034 Atrophy of thyroid (acquired): Secondary | ICD-10-CM

## 2023-09-23 DIAGNOSIS — G479 Sleep disorder, unspecified: Secondary | ICD-10-CM

## 2023-09-23 DIAGNOSIS — R911 Solitary pulmonary nodule: Secondary | ICD-10-CM

## 2023-09-23 DIAGNOSIS — U071 COVID-19 virus infection: Secondary | ICD-10-CM

## 2023-09-23 DIAGNOSIS — E119 Type 2 diabetes mellitus without complications: Secondary | ICD-10-CM

## 2023-09-23 DIAGNOSIS — G4733 Obstructive sleep apnea (adult) (pediatric): Secondary | ICD-10-CM

## 2023-09-23 DIAGNOSIS — K760 Fatty (change of) liver, not elsewhere classified: Secondary | ICD-10-CM

## 2023-09-23 DIAGNOSIS — E785 Hyperlipidemia, unspecified: Secondary | ICD-10-CM

## 2023-09-23 DIAGNOSIS — H25813 Combined forms of age-related cataract, bilateral: Secondary | ICD-10-CM

## 2023-09-23 DIAGNOSIS — I2510 Atherosclerotic heart disease of native coronary artery without angina pectoris: Secondary | ICD-10-CM

## 2023-09-23 DIAGNOSIS — G259 Extrapyramidal and movement disorder, unspecified: Secondary | ICD-10-CM

## 2023-09-23 DIAGNOSIS — C801 Malignant (primary) neoplasm, unspecified: Secondary | ICD-10-CM

## 2023-09-23 DIAGNOSIS — R4189 Other symptoms and signs involving cognitive functions and awareness: Secondary | ICD-10-CM

## 2023-09-23 DIAGNOSIS — M069 Rheumatoid arthritis, unspecified: Secondary | ICD-10-CM

## 2023-09-23 DIAGNOSIS — J45909 Unspecified asthma, uncomplicated: Secondary | ICD-10-CM

## 2023-09-23 DIAGNOSIS — J309 Allergic rhinitis, unspecified: Secondary | ICD-10-CM

## 2023-09-23 DIAGNOSIS — H5111 Convergence insufficiency: Secondary | ICD-10-CM

## 2023-09-23 DIAGNOSIS — J40 Bronchitis, not specified as acute or chronic: Secondary | ICD-10-CM

## 2023-09-23 DIAGNOSIS — M48061 Spinal stenosis, lumbar region without neurogenic claudication: Secondary | ICD-10-CM

## 2023-09-23 DIAGNOSIS — R1319 Other dysphagia: Secondary | ICD-10-CM

## 2023-09-23 DIAGNOSIS — E1165 Type 2 diabetes mellitus with hyperglycemia: Secondary | ICD-10-CM

## 2023-09-23 DIAGNOSIS — Z79899 Other long term (current) drug therapy: Secondary | ICD-10-CM

## 2023-09-23 DIAGNOSIS — R32 Unspecified urinary incontinence: Secondary | ICD-10-CM

## 2023-09-23 DIAGNOSIS — R3989 Other symptoms and signs involving the genitourinary system: Secondary | ICD-10-CM

## 2023-09-23 DIAGNOSIS — F3341 Major depressive disorder, recurrent, in partial remission: Secondary | ICD-10-CM

## 2023-09-23 DIAGNOSIS — K5790 Diverticulosis of intestine, part unspecified, without perforation or abscess without bleeding: Secondary | ICD-10-CM

## 2023-09-23 DIAGNOSIS — M4802 Spinal stenosis, cervical region: Secondary | ICD-10-CM

## 2023-09-23 DIAGNOSIS — G47419 Narcolepsy without cataplexy: Secondary | ICD-10-CM

## 2023-09-23 DIAGNOSIS — E039 Hypothyroidism, unspecified: Secondary | ICD-10-CM

## 2023-09-23 DIAGNOSIS — K219 Gastro-esophageal reflux disease without esophagitis: Secondary | ICD-10-CM

## 2023-09-23 DIAGNOSIS — G629 Polyneuropathy, unspecified: Secondary | ICD-10-CM

## 2023-09-23 DIAGNOSIS — Z9981 Dependence on supplemental oxygen: Secondary | ICD-10-CM

## 2023-09-23 DIAGNOSIS — H547 Unspecified visual loss: Secondary | ICD-10-CM

## 2023-09-23 DIAGNOSIS — E7849 Other hyperlipidemia: Secondary | ICD-10-CM

## 2023-09-23 DIAGNOSIS — R29898 Other symptoms and signs involving the musculoskeletal system: Secondary | ICD-10-CM

## 2023-09-23 DIAGNOSIS — F445 Conversion disorder with seizures or convulsions: Secondary | ICD-10-CM

## 2023-09-23 DIAGNOSIS — R54 Age-related physical debility: Secondary | ICD-10-CM

## 2023-09-23 MED ORDER — TETRACAINE HCL (PF) 0.5 % OP DROP
1 [drp] | Freq: Once | OPHTHALMIC | 0 refills
Start: 2023-09-23 — End: ?

## 2023-09-23 NOTE — Assessment & Plan Note
Ill still need prism in glasses after CE IOL so plan monofocal OU

## 2023-09-23 NOTE — Assessment & Plan Note
The patient has visually significant cataract(s). I am recommending that we proceed with cataract extraction with intraocular lens implantation. I reviewed the risks, benefits and alternatives of this procedure which include but are not limited to: pain, bleeding, infection, loss of vision, and the potential need for further surgery. The use of eyeglasses after cataract surgery and resident involvement was also reviewed. The patient understands these risks and agrees to surgery. All patient questions were answered.     Trauma: -  Refractive Surgery:   Flomax:   Anticoagulation:   Diabetes:     Dilates: 7+mm  Other Considerations:   Target: plano  Anesthesia: MAC/topical    We will plan to operate on the both eye.  Right eye first. Discussed will need prism still she will have convergence insuffiency

## 2023-09-23 NOTE — Progress Notes
Previous Surgery/lasers  none  Medications  Allergies:   Allergies   Allergen Reactions    Amoxicillin-Pot Clavulanate HIVES, ITCHING and URTICARIA    Gemfibrozil HIVES    Morphine MENTAL STATUS CHANGES     Patient became unresponsive within 1 minute of 4 mg of morphine.  Did not become apneic.  Required narcan    Trazodone HALLUCINATIONS    Adhesive Tape (Rosins) RASH    Atorvastatin SEE COMMENTS     States it caused her to have a fatty liver.   Reaction: LFT elevation; Comment: LIPITOR  Reaction: LFT elevation; Comment: LIPITOR  Reaction: LFT elevation; Comment: LIPITOR  Reaction: LFT elevation; Comment: LIPITOR      Latex RASH    Bupropion SEE COMMENTS     Allergy recorded in SMS: WELLBUTRIN~Reactions: ABNORMAL SEIZUR    Cymbalta [Duloxetine] UNKNOWN    Levaquin [Levofloxacin] DIARRHEA      Drops: tears as needd         Assessment and Plan:    Problem   Convergence Insufficiency   Cataract     Cataract  The patient has visually significant cataract(s). I am recommending that we proceed with cataract extraction with intraocular lens implantation. I reviewed the risks, benefits and alternatives of this procedure which include but are not limited to: pain, bleeding, infection, loss of vision, and the potential need for further surgery. The use of eyeglasses after cataract surgery and resident involvement was also reviewed. The patient understands these risks and agrees to surgery. All patient questions were answered.     Trauma: -  Refractive Surgery:   Flomax:   Anticoagulation:   Diabetes:     Dilates: 7+mm  Other Considerations:   Target: plano  Anesthesia: MAC/topical    We will plan to operate on the both eye.  Right eye first. Discussed will need prism still she will have convergence insuffiency       Convergence insufficiency  Ill still need prism in glasses after CE IOL so plan monofocal OU         NEXT VISIT   CE IOL OU         Narda Bonds, MD   [facility]      HPI:  Patient presents with:  Eye Problem: FUV for cataract evaluation; pt c/o noticing more crusting+itching OU. Using ATs/Refresh as advised by Dr. Leo Grosser. Her gls are broken so she hasn't worn any for a few months now.         Exam:  Base Eye Exam       Visual Acuity (Snellen - Linear)         Right Left    Dist sc 20/150 20/50 -2+2              Tonometry (iCare Tonometer, 11:01 AM)         Right Left    Pressure 12 10              Tonometry #2 (Applanation, 11:22 AM)         Right Left    Pressure 15 13              Pupils         Dark Shape React APD    Right 3.5 Round + None    Left 3.5 Round + None              Neuro/Psych       Oriented x3: Yes  Mood/Affect: Normal              Dilation       Both eyes: 1.0% Tropicamide, 2.5% Phenylephrine @ 11:02 AM                  Additional Tests       Glare Testing (Transilluminator)         Medium    Right 20/250    Left 20/125                  Slit Lamp and Fundus Exam       External Exam         Right Left    External Normal Normal              Slit Lamp Exam         Right Left    Lids/Lashes Meibomian gland dysfunction, Dermatochalasis - upper lid Meibomian gland dysfunction, Dermatochalasis - upper lid    Conjunctiva/Sclera Pinguecula Pinguecula    Cornea Clear Clear    Anterior Chamber Deep and quiet Deep and quiet    Iris Flat Flat    Lens 2+ Nuclear sclerosis, 3+ Cortical cataract in VA 2+ Nuclear sclerosis, 3+ Cortical cataract in VA    Anterior Vitreous Normal Normal              Fundus Exam         Right Left    Disc Sharp, healthy rim Sharp, healthy rim    C/D Ratio 0.7 0.7    Macula Flat Flat    Vessels Normal caliber and number Normal caliber and number    Periphery Attached no breaks or tears Attached no breaks or tears                  Refraction       Manifest Refraction (Auto)         Sphere Cylinder Axis Dist VA    Right -3.00 +1.75 006 20/50-1    Left -1.75 +0.50 025 20/25-1                    IOL MASTER W/ IMPLANT CALCULATION          SNR acceptable / AL Symmetric - IOL Selected             OCT OPTIC NERVE          Optic Nerve  Right Eye  Findings location Superior Optic nerve findings: Abnormal Thinning Progression has been stable.     Left Eye  Findings location Superior Optic nerve findings: Abnormal Thinning Progression has been stable.

## 2023-09-24 ENCOUNTER — Ambulatory Visit: Admit: 2023-09-24 | Discharge: 2023-09-24 | Payer: MEDICARE

## 2023-09-24 DIAGNOSIS — H25813 Combined forms of age-related cataract, bilateral: Secondary | ICD-10-CM

## 2023-09-24 DIAGNOSIS — H40003 Preglaucoma, unspecified, bilateral: Secondary | ICD-10-CM

## 2023-09-25 ENCOUNTER — Encounter: Admit: 2023-09-25 | Discharge: 2023-09-25 | Payer: MEDICARE

## 2023-09-25 MED FILL — LEVOTHYROXINE 88 MCG PO TAB: 88 mcg | ORAL | 90 days supply | Qty: 90 | Fill #1 | Status: AC

## 2023-09-26 MED FILL — CHOLECALCIFEROL (VITAMIN D3) 1,250 MCG (50,000 UNIT) PO CAP: 50000 units | 84 days supply | Qty: 12 | Fill #3 | Status: AC

## 2023-09-26 MED FILL — MUPIROCIN 2 % TP OINT: 2 % | TOPICAL | 30 days supply | Qty: 22 | Fill #3 | Status: AC

## 2023-10-03 ENCOUNTER — Encounter: Admit: 2023-10-03 | Discharge: 2023-10-03 | Payer: MEDICARE

## 2023-10-05 ENCOUNTER — Encounter: Admit: 2023-10-05 | Discharge: 2023-10-05 | Payer: MEDICARE

## 2023-10-05 MED FILL — BLOOD SUGAR DIAGNOSTIC MISC STRP: 25 days supply | Qty: 100 | Fill #2 | Status: AC

## 2023-10-06 ENCOUNTER — Encounter: Admit: 2023-10-06 | Discharge: 2023-10-06 | Payer: MEDICARE

## 2023-10-06 MED FILL — PRAMIPEXOLE 0.75 MG PO TAB: 0.75 mg | ORAL | 90 days supply | Qty: 90 | Fill #2 | Status: AC

## 2023-10-06 MED FILL — VENLAFAXINE 150 MG PO CP24: 150 mg | ORAL | 90 days supply | Qty: 90 | Fill #2 | Status: AC

## 2023-10-07 ENCOUNTER — Encounter: Admit: 2023-10-07 | Discharge: 2023-10-07 | Payer: MEDICARE

## 2023-10-07 NOTE — Telephone Encounter
Received notification from scheduling the patient has requested to make an appointment with Dr. Barry Dienes due to I have increased bilateral swelling on legs along w/shiny discoloration. Rheumatologist said I need to f/u with u.    Brett Canales, RN discussed with Dr. Barry Dienes in clinic. He recommends for patient to increase Bumex 1mg  daily to 2 mg daily for 3 days and then back to 1 mg. He would like to see her in [place] clinic on 10/29 at 8:45.    Attempted to call patient to give recommendations. Reached a voicemail line. Message left with medication instructions, d/t/l of appointment and callback number for confirmation she received message. Will attempt again at a later time if callback not received.

## 2023-10-09 ENCOUNTER — Encounter: Admit: 2023-10-09 | Discharge: 2023-10-09 | Payer: MEDICARE

## 2023-10-10 ENCOUNTER — Encounter: Admit: 2023-10-10 | Discharge: 2023-10-10 | Payer: MEDICARE

## 2023-10-10 MED ORDER — BUMETANIDE 1 MG PO TAB
1 mg | ORAL_TABLET | Freq: Every day | ORAL | 3 refills | Status: AC
Start: 2023-10-10 — End: ?

## 2023-10-15 ENCOUNTER — Encounter: Admit: 2023-10-15 | Discharge: 2023-10-15 | Payer: MEDICARE

## 2023-10-16 ENCOUNTER — Encounter: Admit: 2023-10-16 | Discharge: 2023-10-16 | Payer: MEDICARE

## 2023-10-20 ENCOUNTER — Encounter: Admit: 2023-10-20 | Discharge: 2023-10-20 | Payer: MEDICARE

## 2023-10-20 MED ORDER — METHOTREXATE SODIUM (PF) 25 MG/ML IJ SOLN
25 mg | SUBCUTANEOUS | 0 refills | Status: AC
Start: 2023-10-20 — End: ?
  Filled 2023-10-22: qty 24, 84d supply, fill #1

## 2023-10-20 NOTE — Telephone Encounter
09/03/23 LOV  Plan:  Obtain labs today as below.  If labs stable can increase DMARD interval monitoring to every 3 months  Continue methotrexate 25 mg subcutaneous weekly  Continue rituximab infusions every 4 months  Discussed importance of wearing CPAP at night which may benefit overall fatigue  5.    For her myofascial pain, continue naltrexone.  Will adjust medication when daughter is able to MyChart in her current dosage.  Will need to screen her for depression and suicidal ideation prior to increasing her medication dose.  6.   MRI of the right wrist and hand to evaluate for synovitis  7.  Discussed she need to be seen/discussed volume status with her cardiology team about potentially increasing her diuretics    next appt 12/15/23   Labs 09/03/23

## 2023-10-21 ENCOUNTER — Encounter: Admit: 2023-10-21 | Discharge: 2023-10-21 | Payer: MEDICARE

## 2023-10-22 ENCOUNTER — Encounter: Admit: 2023-10-22 | Discharge: 2023-10-22 | Payer: MEDICARE

## 2023-10-22 MED FILL — PREGABALIN 25 MG PO CAP: 25 mg | ORAL | 30 days supply | Qty: 90 | Fill #2 | Status: AC

## 2023-10-22 MED FILL — FOLIC ACID 1 MG PO TAB: 1 mg | ORAL | 30 days supply | Qty: 120 | Fill #2 | Status: AC

## 2023-10-22 MED FILL — MUPIROCIN 2 % TP OINT: 2 % | TOPICAL | 30 days supply | Qty: 22 | Fill #4 | Status: AC

## 2023-10-23 ENCOUNTER — Encounter: Admit: 2023-10-23 | Discharge: 2023-10-23 | Payer: MEDICARE

## 2023-10-24 ENCOUNTER — Encounter: Admit: 2023-10-24 | Discharge: 2023-10-24 | Payer: MEDICARE

## 2023-10-25 ENCOUNTER — Encounter: Admit: 2023-10-25 | Discharge: 2023-10-25 | Payer: MEDICARE

## 2023-10-26 ENCOUNTER — Encounter: Admit: 2023-10-26 | Discharge: 2023-10-26 | Payer: MEDICARE

## 2023-10-27 ENCOUNTER — Encounter: Admit: 2023-10-27 | Discharge: 2023-10-27 | Payer: MEDICARE

## 2023-10-27 MED FILL — INSULIN ASPART 100 UNIT/ML SC FLEXPEN: 1003 unit/mL (3 mL) | SUBCUTANEOUS | 25 days supply | Qty: 30 | Fill #2 | Status: AC

## 2023-10-27 MED FILL — JARDIANCE 25 MG PO TAB: 25 mg | ORAL | 90 days supply | Qty: 90 | Fill #1 | Status: CP

## 2023-10-27 MED FILL — LANTUS SOLOSTAR U-100 INSULIN 100 UNIT/ML (3 ML) SC INPN: 1003 unit/mL (3 mL) | SUBCUTANEOUS | 30 days supply | Qty: 30 | Fill #3 | Status: AC

## 2023-10-28 ENCOUNTER — Encounter: Admit: 2023-10-28 | Discharge: 2023-10-28 | Payer: MEDICARE

## 2023-10-29 ENCOUNTER — Encounter: Admit: 2023-10-29 | Discharge: 2023-10-29 | Payer: MEDICARE

## 2023-10-29 MED FILL — GEMTESA 75 MG PO TAB: 75 mg | ORAL | 30 days supply | Qty: 30 | Fill #2 | Status: CP

## 2023-10-29 MED FILL — LANCETS 33 GAUGE MISC MISC: 33 gauge | 30 days supply | Qty: 100 | Fill #1 | Status: CP

## 2023-10-29 MED FILL — BLOOD SUGAR DIAGNOSTIC MISC STRP: 25 days supply | Qty: 100 | Fill #2 | Status: CP

## 2023-10-29 NOTE — Telephone Encounter
Calling to reschedule 11/6 surgery.  Sorry to hear you are not feeling well.   I will move today's surgery to 11/20th and we will need to reschedule the left eye.  Call me when you are feeling better at [phone].

## 2023-11-03 ENCOUNTER — Encounter: Admit: 2023-11-03 | Discharge: 2023-11-03 | Payer: MEDICARE

## 2023-11-06 ENCOUNTER — Encounter: Admit: 2023-11-06 | Discharge: 2023-11-06 | Payer: MEDICARE

## 2023-11-14 ENCOUNTER — Encounter: Admit: 2023-11-14 | Discharge: 2023-11-14 | Payer: MEDICARE

## 2023-11-28 ENCOUNTER — Encounter: Admit: 2023-11-28 | Discharge: 2023-11-28 | Payer: MEDICARE

## 2023-11-28 ENCOUNTER — Ambulatory Visit: Admit: 2023-11-28 | Discharge: 2023-11-28 | Payer: MEDICARE

## 2023-11-28 DIAGNOSIS — Z79899 Other long term (current) drug therapy: Secondary | ICD-10-CM

## 2023-11-28 DIAGNOSIS — M0579 Rheumatoid arthritis with rheumatoid factor of multiple sites without organ or systems involvement: Secondary | ICD-10-CM

## 2023-11-28 LAB — CBC AND DIFF
~~LOC~~ BKR ABSOLUTE BASO COUNT: 0.1 10*3/uL (ref 0.00–0.20)
~~LOC~~ BKR ABSOLUTE EOS COUNT: 0.2 10*3/uL (ref 0.00–0.45)
~~LOC~~ BKR ABSOLUTE LYMPH COUNT: 1.6 10*3/uL (ref 1.00–4.80)
~~LOC~~ BKR ABSOLUTE MONO COUNT: 0.6 10*3/uL (ref 0.00–0.80)
~~LOC~~ BKR ABSOLUTE NEUTROPHIL: 3 10*3/uL (ref 1.80–7.00)
~~LOC~~ BKR BASOPHILS %: 1 % (ref 0–2)
~~LOC~~ BKR EOSINOPHILS %: 4 % (ref 0–5)
~~LOC~~ BKR HEMATOCRIT: 38 % (ref 36.0–45.0)
~~LOC~~ BKR LYMPHOCYTES %: 29 % (ref 24–44)
~~LOC~~ BKR MCH: 29 pg (ref 26.0–34.0)
~~LOC~~ BKR MCHC: 33 g/dL (ref 32.0–36.0)
~~LOC~~ BKR MCV: 86 fL (ref 80.0–100.0)
~~LOC~~ BKR MONOCYTES %: 11 % (ref 4–12)
~~LOC~~ BKR MPV: 9.6 fL (ref 7.0–11.0)
~~LOC~~ BKR NEUTROPHILS %: 55 % (ref 41–77)
~~LOC~~ BKR PLATELET COUNT: 220 10*3/uL (ref 150–400)
~~LOC~~ BKR RBC COUNT: 4.4 10*6/uL — ABNORMAL HIGH (ref 4.00–5.00)
~~LOC~~ BKR RDW: 13 % (ref 11.0–15.0)
~~LOC~~ BKR WBC COUNT: 5.5 10*3/uL (ref 4.50–11.00)

## 2023-11-28 LAB — CREATININE: ~~LOC~~ BKR CREATININE: 0.7 mg/dL (ref 0.40–1.00)

## 2023-11-28 MED ORDER — DIPHENHYDRAMINE HCL 25 MG PO CAP
25 mg | Freq: Once | ORAL | 0 refills
Start: 2023-11-28 — End: ?

## 2023-11-28 MED ORDER — METHYLPREDNISOLONE SOD SUC(PF) 125 MG/2 ML IJ SOLR
100 mg | Freq: Once | INTRAVENOUS | 0 refills
Start: 2023-11-28 — End: ?

## 2023-11-28 MED ORDER — RITUXIMAB IVPB
1000 mg | Freq: Once | INTRAVENOUS | 0 refills
Start: 2023-11-28 — End: ?

## 2023-11-28 MED ORDER — ACETAMINOPHEN 500 MG PO TAB
500 mg | Freq: Once | ORAL | 0 refills | Status: CP
Start: 2023-11-28 — End: ?
  Administered 2023-11-28: 18:00:00 500 mg via ORAL

## 2023-11-28 MED ORDER — RITUXIMAB IVPB
1000 mg | Freq: Once | INTRAVENOUS | 0 refills | Status: CP
Start: 2023-11-28 — End: ?
  Administered 2023-11-28 (×2): 1000 mg via INTRAVENOUS

## 2023-11-28 MED ORDER — ACETAMINOPHEN 500 MG PO TAB
500 mg | Freq: Once | ORAL | 0 refills
Start: 2023-11-28 — End: ?

## 2023-11-28 MED ORDER — METHYLPREDNISOLONE SOD SUC(PF) 125 MG/2 ML IJ SOLR
100 mg | Freq: Once | INTRAVENOUS | 0 refills | Status: CP
Start: 2023-11-28 — End: ?
  Administered 2023-11-28: 18:00:00 100 mg via INTRAVENOUS

## 2023-11-28 MED ORDER — DIPHENHYDRAMINE HCL 25 MG PO CAP
25 mg | Freq: Once | ORAL | 0 refills | Status: CP
Start: 2023-11-28 — End: ?
  Administered 2023-11-28: 18:00:00 25 mg via ORAL

## 2023-11-28 NOTE — Progress Notes
~  1645: Rituxan Infusion completed and flushed. See e-mar for administration details and Post infusion vitals completed. No concern for infusion reaction. Patient has no complaints PIV discontinued. Patient provided with printed AVS. Prompted patient to schedule upcoming appointment(s) at infusion checkout or call infusion scheduling. Patient verbalized understanding. Patient ambulated off unit.     Vitals:    11/28/23 1158 11/28/23 1636   BP: 118/66 125/77   BP Source: Arm, Left Upper Arm, Right Upper   Pulse: 79 94   Temp: 36.7 ?C (98.1 ?F)    Resp: 18    SpO2: 98% 97%   O2 Device: None (Room air)    TempSrc: Oral    Weight: 94.6 kg (208 lb 9.6 oz)    Height: 167.6 cm (5' 6)

## 2023-12-01 ENCOUNTER — Encounter: Admit: 2023-12-01 | Discharge: 2023-12-01 | Payer: MEDICARE

## 2023-12-02 ENCOUNTER — Encounter: Admit: 2023-12-02 | Discharge: 2023-12-02 | Payer: MEDICARE

## 2023-12-05 ENCOUNTER — Encounter: Admit: 2023-12-05 | Discharge: 2023-12-05 | Payer: MEDICARE

## 2023-12-10 ENCOUNTER — Encounter: Admit: 2023-12-10 | Discharge: 2023-12-10 | Payer: MEDICARE

## 2023-12-11 ENCOUNTER — Encounter: Admit: 2023-12-11 | Discharge: 2023-12-11 | Payer: MEDICARE

## 2023-12-12 ENCOUNTER — Encounter: Admit: 2023-12-12 | Discharge: 2023-12-12 | Payer: MEDICARE

## 2023-12-12 ENCOUNTER — Ambulatory Visit: Admit: 2023-12-12 | Discharge: 2023-12-13 | Payer: MEDICARE

## 2023-12-12 MED FILL — PREGABALIN 25 MG PO CAP: 25 mg | ORAL | 30 days supply | Qty: 90 | Fill #3 | Status: CP

## 2023-12-12 MED FILL — CHOLECALCIFEROL (VITAMIN D3) 1,250 MCG (50,000 UNIT) PO CAP: 50000 units | 84 days supply | Qty: 12 | Fill #4 | Status: CP

## 2023-12-12 MED FILL — FOLIC ACID 1 MG PO TAB: 1 mg | ORAL | 30 days supply | Qty: 120 | Fill #3 | Status: CP

## 2023-12-12 MED FILL — PANTOPRAZOLE 40 MG PO TBEC: 40 mg | ORAL | 90 days supply | Qty: 90 | Fill #2 | Status: CP

## 2023-12-12 MED FILL — GEMTESA 75 MG PO TAB: 75 mg | ORAL | 30 days supply | Qty: 30 | Fill #3 | Status: CP

## 2023-12-12 MED FILL — SPIRONOLACTONE 25 MG PO TAB: 25 mg | ORAL | 90 days supply | Qty: 90 | Fill #4 | Status: CP

## 2023-12-12 MED FILL — MUPIROCIN 2 % TP OINT: 2 % | TOPICAL | 30 days supply | Qty: 22 | Fill #5 | Status: CP

## 2023-12-15 ENCOUNTER — Encounter: Admit: 2023-12-15 | Discharge: 2023-12-15 | Payer: MEDICARE

## 2023-12-15 ENCOUNTER — Ambulatory Visit: Admit: 2023-12-15 | Discharge: 2023-12-15 | Payer: MEDICARE

## 2023-12-15 ENCOUNTER — Ambulatory Visit: Admit: 2023-12-15 | Discharge: 2023-12-16 | Payer: MEDICARE

## 2023-12-15 MED FILL — FAMOTIDINE 40 MG PO TAB: 40 mg | ORAL | 90 days supply | Qty: 90 | Fill #1 | Status: CP

## 2023-12-15 MED FILL — CELECOXIB 200 MG PO CAP: 200 mg | ORAL | 30 days supply | Qty: 60 | Fill #1 | Status: CP

## 2023-12-15 MED FILL — PREDNISONE 5 MG PO TAB: 5 mg | ORAL | 90 days supply | Qty: 135 | Fill #1 | Status: CP

## 2023-12-18 ENCOUNTER — Encounter: Admit: 2023-12-18 | Discharge: 2023-12-18 | Payer: MEDICARE

## 2023-12-18 DIAGNOSIS — R0902 Hypoxemia: Secondary | ICD-10-CM

## 2023-12-18 DIAGNOSIS — M0579 Rheumatoid arthritis with rheumatoid factor of multiple sites without organ or systems involvement: Secondary | ICD-10-CM

## 2023-12-18 DIAGNOSIS — J101 Influenza due to other identified influenza virus with other respiratory manifestations: Secondary | ICD-10-CM

## 2023-12-18 MED ORDER — LACTATED RINGERS IV SOLP
1000 mL | INTRAVENOUS | 0 refills | Status: CP
Start: 2023-12-18 — End: ?
  Administered 2023-12-19: 06:00:00 1000 mL via INTRAVENOUS

## 2023-12-18 MED ORDER — OSELTAMIVIR 75 MG PO CAP
75 mg | Freq: Once | ORAL | 0 refills | Status: CP
Start: 2023-12-18 — End: ?
  Administered 2023-12-19: 06:00:00 75 mg via ORAL

## 2023-12-18 MED ORDER — ACETAMINOPHEN 500 MG PO TAB
1000 mg | Freq: Once | ORAL | 0 refills | Status: CP
Start: 2023-12-18 — End: ?
  Administered 2023-12-19: 06:00:00 1000 mg via ORAL

## 2023-12-19 ENCOUNTER — Emergency Department: Admit: 2023-12-19 | Discharge: 2023-12-19 | Payer: MEDICARE

## 2023-12-19 ENCOUNTER — Encounter: Admit: 2023-12-19 | Discharge: 2023-12-19 | Payer: MEDICARE

## 2023-12-19 ENCOUNTER — Observation Stay: Admit: 2023-12-19 | Discharge: 2023-12-20 | Payer: MEDICARE

## 2023-12-19 ENCOUNTER — Inpatient Hospital Stay: Admit: 2023-12-19 | Discharge: 2023-12-21 | Disposition: A | Discharge status: Home / Self Care | Payer: MEDICARE

## 2023-12-19 DIAGNOSIS — J1008 Influenza due to other identified influenza virus with other specified pneumonia: Secondary | ICD-10-CM

## 2023-12-19 LAB — COVID INFLUENZA A/B AND RSV PCR: ~~LOC~~ BKR INFLUENZA A VIRUS: DETECTED — AB

## 2023-12-19 LAB — COMPREHENSIVE METABOLIC PANEL
~~LOC~~ BKR BLD UREA NITROGEN: 19 mg/dL (ref 7–25)
~~LOC~~ BKR GLOMERULAR FILTRATION RATE (GFR): >60 mL/min — ABNORMAL LOW (ref >60–4.80)
~~LOC~~ BKR POTASSIUM: 4.2 mmol/L (ref 3.5–5.1)

## 2023-12-19 LAB — CBC AND DIFF
~~LOC~~ BKR HEMATOCRIT: 41 % — ABNORMAL LOW (ref 36.0–45.0)
~~LOC~~ BKR MCHC: 33 g/dL (ref 32.0–36.0)
~~LOC~~ BKR MCV: 87 fL — ABNORMAL HIGH (ref 80.0–100.0)
~~LOC~~ BKR MDW (MONOCYTE DISTRIBUTION WIDTH): 23 — ABNORMAL HIGH (ref <=20.6)
~~LOC~~ BKR MPV: 8.6 fL (ref 7.0–11.0)
~~LOC~~ BKR PLATELET COUNT: 197 10*3/uL (ref 150–400)
~~LOC~~ BKR RBC COUNT: 4.7 10*6/uL — ABNORMAL LOW (ref 4.00–5.00)
~~LOC~~ BKR RDW: 13 % (ref 11.0–15.0)
~~LOC~~ BKR WBC COUNT: 7.3 10*3/uL (ref 4.50–11.00)

## 2023-12-19 LAB — URINALYSIS DIPSTICK REFLEX TO CULTURE
~~LOC~~ BKR LEUKOCYTES: NEGATIVE 10*3/uL (ref 0.00–0.20)
~~LOC~~ BKR URINE BILE: NEGATIVE 10*3/uL (ref 3–12)
~~LOC~~ BKR URINE KETONE: NEGATIVE mmol/L (ref 21–30)
~~LOC~~ BKR URINE PH: 8 /HPF — ABNORMAL HIGH (ref 5.0–8.0)

## 2023-12-19 LAB — PHOSPHORUS: ~~LOC~~ BKR PHOSPHORUS: 3.4 mg/dL (ref 2.0–4.5)

## 2023-12-19 LAB — KC ED MAIN ECG TRIAGE ONLY
P AXIS: 32 degrees
P-R INTERVAL: 162 ms
Q-T INTERVAL: 308 ms
QRS DURATION: 88 ms
QTC CALCULATION (BAZETT): 428 ms
R AXIS: -7 degrees
T AXIS: 56 degrees
VENTRICULAR RATE: 116 {beats}/min

## 2023-12-19 LAB — POC GLUCOSE
~~LOC~~ BKR POC GLUCOSE: 142 mg/dL — ABNORMAL HIGH (ref 70–100)
~~LOC~~ BKR POC GLUCOSE: 186 mg/dL — ABNORMAL HIGH (ref 70–100)

## 2023-12-19 LAB — BLOOD GASES, PERIPHERAL VENOUS
~~LOC~~ BKR PCO2-VENOUS: 43 mmHg (ref 36–50)
~~LOC~~ BKR PO2-VENOUS: 48 mmHg (ref 33–48)

## 2023-12-19 LAB — NT-PRO-BNP: ~~LOC~~ BKR NT-PRO-BNP: 197 pg/mL — ABNORMAL HIGH (ref <125)

## 2023-12-19 LAB — URINALYSIS MICROSCOPIC REFLEX TO CULTURE: ~~LOC~~ BKR WBC, UA: 0 /HPF — ABNORMAL HIGH (ref 3.5–5.0)

## 2023-12-19 LAB — MAGNESIUM: ~~LOC~~ BKR MAGNESIUM: 2.2 mg/dL (ref 1.6–2.6)

## 2023-12-19 MED ORDER — DOXYCYCLINE 100 MG/100 ML IVPB (MB+)
100 mg | Freq: Once | INTRAVENOUS | 0 refills | Status: CP
Start: 2023-12-19 — End: ?
  Administered 2023-12-19 (×2): 100 mg via INTRAVENOUS

## 2023-12-19 MED ORDER — LACTOBACILLUS RHAMNOSUS GG 15 BILLION CELL PO CPSP
1 | Freq: Every day | ORAL | 0 refills | Status: DC
Start: 2023-12-19 — End: 2023-12-21
  Administered 2023-12-19 – 2023-12-21 (×3): 1 via ORAL

## 2023-12-19 MED ORDER — ONDANSETRON 4 MG PO TBDI
4 mg | ORAL | 0 refills | Status: DC | PRN
Start: 2023-12-19 — End: 2023-12-21

## 2023-12-19 MED ORDER — IMS MIXTURE TEMPLATE
7.5 mg | Freq: Every day | ORAL | 0 refills | Status: DC
Start: 2023-12-19 — End: 2023-12-21
  Administered 2023-12-19 – 2023-12-21 (×3): 7.5 mg via ORAL

## 2023-12-19 MED ORDER — ONDANSETRON HCL (PF) 4 MG/2 ML IJ SOLN
4 mg | INTRAVENOUS | 0 refills | Status: DC | PRN
Start: 2023-12-19 — End: 2023-12-21
  Administered 2023-12-20: 12:00:00 4 mg via INTRAVENOUS

## 2023-12-19 MED ORDER — DICLOFENAC SODIUM 1 % TP GEL
4 g | Freq: Three times a day (TID) | TOPICAL | 0 refills | Status: DC
Start: 2023-12-19 — End: 2023-12-21
  Administered 2023-12-19: 15:00:00 4 g via TOPICAL

## 2023-12-19 MED ORDER — ACETAMINOPHEN 500 MG PO TAB
1000 mg | ORAL | 0 refills | Status: DC | PRN
Start: 2023-12-19 — End: 2023-12-21
  Administered 2023-12-19 – 2023-12-21 (×5): 1000 mg via ORAL

## 2023-12-19 MED ORDER — DEXTROMETHORPHAN-GUAIFENESIN 10-100 MG/5 ML PO SYRP
10 mL | ORAL | 0 refills | Status: DC | PRN
Start: 2023-12-19 — End: 2023-12-21
  Administered 2023-12-20 – 2023-12-21 (×2): 10 mL via ORAL

## 2023-12-19 MED ORDER — CALCIUM CARBONATE 500 MG CALCIUM (1,250 MG) PO TAB
1200 mg | Freq: Two times a day (BID) | ORAL | 0 refills | Status: DC
Start: 2023-12-19 — End: 2023-12-21
  Administered 2023-12-19 – 2023-12-21 (×5): 1250 mg via ORAL

## 2023-12-19 MED ORDER — OSELTAMIVIR 75 MG PO CAP
75 mg | Freq: Two times a day (BID) | ORAL | 0 refills | Status: DC
Start: 2023-12-19 — End: 2023-12-21
  Administered 2023-12-19 – 2023-12-21 (×5): 75 mg via ORAL

## 2023-12-19 MED ORDER — DOXYCYCLINE HYCLATE 100 MG PO TAB
100 mg | Freq: Two times a day (BID) | ORAL | 0 refills | Status: DC
Start: 2023-12-19 — End: 2023-12-20
  Administered 2023-12-19 – 2023-12-20 (×3): 100 mg via ORAL

## 2023-12-19 MED ORDER — ROSUVASTATIN 20 MG PO TAB
20 mg | Freq: Every day | ORAL | 0 refills | Status: DC
Start: 2023-12-19 — End: 2023-12-21
  Administered 2023-12-19 – 2023-12-21 (×3): 20 mg via ORAL

## 2023-12-19 MED ORDER — ENOXAPARIN 40 MG/0.4 ML SC SYRG
40 mg | Freq: Every day | SUBCUTANEOUS | 0 refills | Status: DC
Start: 2023-12-19 — End: 2023-12-21
  Administered 2023-12-20 – 2023-12-21 (×2): 40 mg via SUBCUTANEOUS

## 2023-12-19 MED ORDER — NYSTATIN 100,000 UNIT/GRAM TP CREA
Freq: Two times a day (BID) | TOPICAL | 0 refills | Status: DC | PRN
Start: 2023-12-19 — End: 2023-12-21
  Administered 2023-12-19: 16:00:00 via TOPICAL

## 2023-12-19 MED ORDER — VENLAFAXINE 150 MG PO CP24
150 mg | Freq: Every day | ORAL | 0 refills | Status: DC
Start: 2023-12-19 — End: 2023-12-21
  Administered 2023-12-19 – 2023-12-21 (×3): 150 mg via ORAL

## 2023-12-19 MED ORDER — INSULIN ASPART 100 UNIT/ML SC FLEXPEN
0-6 [IU] | Freq: Before meals | SUBCUTANEOUS | 0 refills | Status: DC
Start: 2023-12-19 — End: 2023-12-21
  Administered 2023-12-19: 18:00:00 1 [IU] via SUBCUTANEOUS

## 2023-12-19 MED ORDER — CEFTRIAXONE INJ 2GM IVP
2 g | INTRAVENOUS | 0 refills | Status: DC
Start: 2023-12-19 — End: 2023-12-20
  Administered 2023-12-20: 04:00:00 2 g via INTRAVENOUS

## 2023-12-19 MED ORDER — DEXTROSE 50 % IN WATER (D50W) IV SYRG
12.5-25 g | INTRAVENOUS | 0 refills | Status: DC | PRN
Start: 2023-12-19 — End: 2023-12-21

## 2023-12-19 MED ORDER — CELECOXIB 200 MG PO CAP
200 mg | Freq: Two times a day (BID) | ORAL | 0 refills | Status: DC
Start: 2023-12-19 — End: 2023-12-21
  Administered 2023-12-19 – 2023-12-21 (×5): 200 mg via ORAL

## 2023-12-19 MED ORDER — FERROUS SULFATE 325 MG (65 MG IRON) PO TAB
325 mg | Freq: Two times a day (BID) | ORAL | 0 refills | Status: DC
Start: 2023-12-19 — End: 2023-12-21
  Administered 2023-12-19 – 2023-12-21 (×5): 325 mg via ORAL

## 2023-12-19 MED ORDER — MELATONIN 5 MG PO TAB
5 mg | Freq: Every evening | ORAL | 0 refills | Status: DC | PRN
Start: 2023-12-19 — End: 2023-12-21
  Administered 2023-12-21: 05:00:00 5 mg via ORAL

## 2023-12-19 MED ORDER — BUMETANIDE 1 MG PO TAB
1 mg | Freq: Every day | ORAL | 0 refills | Status: DC
Start: 2023-12-19 — End: 2023-12-21
  Administered 2023-12-19 – 2023-12-21 (×3): 1 mg via ORAL

## 2023-12-19 MED ORDER — NITROGLYCERIN 0.4 MG SL SUBL
.4 mg | SUBLINGUAL | 0 refills | Status: DC | PRN
Start: 2023-12-19 — End: 2023-12-21

## 2023-12-19 MED ORDER — INSULIN GLARGINE 100 UNIT/ML (3 ML) SC INJ PEN
50 [IU] | Freq: Every evening | SUBCUTANEOUS | 0 refills | Status: DC
Start: 2023-12-19 — End: 2023-12-21
  Administered 2023-12-20: 04:00:00 50 [IU] via SUBCUTANEOUS

## 2023-12-19 MED ORDER — CEFTRIAXONE INJ 2GM IVP
2 g | Freq: Once | INTRAVENOUS | 0 refills | Status: CP
Start: 2023-12-19 — End: ?
  Administered 2023-12-19: 09:00:00 2 g via INTRAVENOUS

## 2023-12-19 MED ORDER — EMPAGLIFLOZIN 25 MG PO TAB
25 mg | Freq: Every day | ORAL | 0 refills | Status: DC
Start: 2023-12-19 — End: 2023-12-21
  Administered 2023-12-19 – 2023-12-21 (×3): 25 mg via ORAL

## 2023-12-19 MED ORDER — POLYETHYLENE GLYCOL 3350 17 GRAM PO PWPK
1 | Freq: Every day | ORAL | 0 refills | Status: DC | PRN
Start: 2023-12-19 — End: 2023-12-21
  Administered 2023-12-20 – 2023-12-21 (×2): 17 g via ORAL

## 2023-12-19 MED ORDER — SENNOSIDES-DOCUSATE SODIUM 8.6-50 MG PO TAB
1 | Freq: Every day | ORAL | 0 refills | Status: DC | PRN
Start: 2023-12-19 — End: 2023-12-21
  Administered 2023-12-20 – 2023-12-21 (×2): 1 via ORAL

## 2023-12-19 MED ORDER — ASPIRIN 81 MG PO CHEW
81 mg | Freq: Every evening | ORAL | 0 refills | Status: DC
Start: 2023-12-19 — End: 2023-12-21
  Administered 2023-12-20 – 2023-12-21 (×2): 81 mg via ORAL

## 2023-12-19 MED ORDER — MIRABEGRON 25 MG PO TB24
25 mg | Freq: Every day | ORAL | 0 refills | Status: DC
Start: 2023-12-19 — End: 2023-12-21
  Administered 2023-12-19 – 2023-12-21 (×3): 25 mg via ORAL

## 2023-12-19 MED ORDER — FAMOTIDINE 20 MG PO TAB
40 mg | Freq: Every evening | ORAL | 0 refills | Status: DC
Start: 2023-12-19 — End: 2023-12-21
  Administered 2023-12-20 – 2023-12-21 (×2): 40 mg via ORAL

## 2023-12-19 MED ORDER — LEVOTHYROXINE 88 MCG PO TAB
88 ug | Freq: Every morning | ORAL | 0 refills | Status: DC
Start: 2023-12-19 — End: 2023-12-21
  Administered 2023-12-19 – 2023-12-21 (×3): 88 ug via ORAL

## 2023-12-19 MED ORDER — PANTOPRAZOLE 40 MG PO TBEC
40 mg | Freq: Two times a day (BID) | ORAL | 0 refills | Status: DC
Start: 2023-12-19 — End: 2023-12-21
  Administered 2023-12-19 – 2023-12-21 (×5): 40 mg via ORAL

## 2023-12-19 MED ORDER — ACETAMINOPHEN 500 MG PO TAB
1000 mg | ORAL | 0 refills | Status: DC | PRN
Start: 2023-12-19 — End: 2023-12-19
  Administered 2023-12-19: 12:00:00 1000 mg via ORAL

## 2023-12-19 MED ORDER — FOLIC ACID 1 MG PO TAB
4 mg | Freq: Every day | ORAL | 0 refills | Status: DC
Start: 2023-12-19 — End: 2023-12-21
  Administered 2023-12-19 – 2023-12-21 (×3): 4 mg via ORAL

## 2023-12-19 MED ORDER — LORATADINE 10 MG PO TAB
10 mg | Freq: Every day | ORAL | 0 refills | Status: DC
Start: 2023-12-19 — End: 2023-12-21
  Administered 2023-12-19 – 2023-12-21 (×3): 10 mg via ORAL

## 2023-12-19 MED ORDER — SPIRONOLACTONE 25 MG PO TAB
25 mg | Freq: Every day | ORAL | 0 refills | Status: DC
Start: 2023-12-19 — End: 2023-12-21
  Administered 2023-12-19 – 2023-12-21 (×3): 25 mg via ORAL

## 2023-12-19 NOTE — Progress Notes
RT Adult Assessment Note    NAME:Hailey Rodgers             MRN: [id number]             DOB:Oct 02, 1951          AGE: 72 y.o.  ADMISSION DATE: 12/18/2023             DAYS ADMITTED: LOS: 0 days    Additional Comments:  Impressions of the patient: pt was resting on 2lpm oxygen. No concerns noted at the time.   Intervention(s)/outcome(s): none.   Patient education that was completed:   Recommendations to the care team:     Vital Signs:  Pulse: 76  RR: 20 PER MINUTE  SpO2: 95 %  O2 Device: Nasal cannula  Liter Flow: 2 Lpm  O2%:      Breath Sounds:   Right Apex Breath Sounds: Clear (Implies normal)  Right Base Breath Sounds: Decreased  Left Apex Breath Sounds: Clear (Implies normal)  Left Base Breath Sounds: Decreased  Respiratory Effort:      Comments:

## 2023-12-19 NOTE — Progress Notes
Pharmacy High Risk Medication Review    A high risk medication review has been performed for Hailey Rodgers by a pharmacy team member.      Medication class(es) to review on inpatient med list:  None Identified      The medications class(es) are noted to be high risk to mobility or mentation in patients >72 years of age. If your patient has been CAM positive (diagnosed with delirium) at any point during this hospitalization then it would be particularly helpful to review these medications. Each medication has its own unique risks and benefits to the patient, we ask that these medications are reviewed and considered for de-prescribing where appropriate.     Consider reviewing [email or web address]es for anticholinergic burden calculation.  If you need further assistance identifying or de-prescribing, please contact pharmacy.      Current Inpatient Medications:  Scheduled Meds:aspirin chewable tablet 81 mg, 81 mg, Oral, QHS  bumetanide (BUMEX) tablet 1 mg, 1 mg, Oral, QDAY  calcium carbonate (OS-CAL) tablet 1,250 mg, 1,250 mg, Oral, BID  cefTRIAXone (ROCEPHIN) IVP 2 g, 2 g, Intravenous, Q24H*  celecoxib (CeleBREX) capsule 200 mg, 200 mg, Oral, BID  diclofenac sodium (VOLTAREN) 1 % topical gel 4 g, 4 g, Topical, TID  doxycycline hyclate (VIBRACIN) tablet 100 mg, 100 mg, Oral, BID  empagliflozin (JARDIANCE) tablet 25 mg, 25 mg, Oral, QDAY  enoxaparin (LOVENOX) syringe 40 mg, 40 mg, Subcutaneous, QDAY(21)  famotidine (PEPCID) tablet 40 mg, 40 mg, Oral, QHS  ferrous sulfate (FEOSOL) tablet 325 mg, 325 mg, Oral, BID  folic acid (FOLVITE) tablet 4 mg, 4 mg, Oral, QDAY  insulin aspart (U-100) (NOVOLOG FLEXPEN U-100 INSULIN) injection PEN 0-6 Units, 0-6 Units, Subcutaneous, ACHS (22)  insulin glargine (LANTUS SOLOSTAR U-100 INSULIN) injection PEN 50 Units, 50 Units, Subcutaneous, QHS(22)  lactobacillus rhamnosus GG (CULTURELLE) 15 billion cell capsule 1 capsule, 1 capsule, Oral, QDAY  levothyroxine (SYNTHROID) tablet 88 mcg, 88 mcg, Oral, QAM8  loratadine (CLARITIN) tablet 10 mg, 10 mg, Oral, QDAY  mirabegron (MYRBETRIQ) ER tablet 25 mg, 25 mg, Oral, QDAY  oseltamivir (TAMIFLU) capsule 75 mg, 75 mg, Oral, BID  pantoprazole DR (PROTONIX) tablet 40 mg, 40 mg, Oral, BID  predniSONE (DELTASONE) tablet 7.5 mg, 7.5 mg, Oral, QDAY  rosuvastatin (CRESTOR) tablet 20 mg, 20 mg, Oral, QDAY  spironolactone (ALDACTONE) tablet 25 mg, 25 mg, Oral, QDAY  venlafaxine XR (EFFEXOR XR) capsule 150 mg, 150 mg, Oral, QDAY    Continuous Infusions:  PRN and Respiratory Meds:acetaminophen Q6H PRN, dextromethorphan/guaiFENesin  (ROBITUSSIN-DM) oral syrup Q4H PRN, dextrose 50% (D50) IV PRN, melatonin QHS PRN, nitroglycerin Q5 MIN PRN, nystatin BID PRN, ondansetron Q6H PRN **OR** ondansetron (ZOFRAN) IV Q6H PRN, polyethylene glycol 3350 QDAY PRN, sennosides-docusate sodium QDAY PRN        Thank you,  Valetta Close, [facility]  12/19/2023

## 2023-12-19 NOTE — Case Management (ED)
Case Management Admission Assessment    If assistance is needed beyond this initial assessment, please contact team NCM, Orvan Falconer, RN, via [place].      NAME:Hailey Rodgers                          MRN: [id number]             DOB:1951-08-16          AGE: 72 y.o.  ADMISSION DATE: 12/18/2023             DAYS ADMITTED: LOS: 0 days      Today?s Date: 12/19/2023    Source of Information: EMR and patient's daughter, Hailey Rodgers, via telephone assessment.    Ms. Bowlan is a 72 year old with a history of RA, HFpEF, OSA, HTN, HLP, Type II DM, GAD, Depression, chronic pain, RLS, Vitamin D Deficiency, Hypothyroidism, and urge incontinence presents to the ED with fevers, cough, and confusion found to have acute hypoxic respiratory failure secondary to Influenza A.     Plan  Plan: Case Management Assessment, Assist PRN with SW/NCM Services    NCM confirmed home address.  Patient last saw her PCP about 1 week ago.  Patient lives with her husband and daughter, Hailey Rodgers, in a mobile home with ramp access.    Daughter, Olegario Messier, is an Charity fundraiser, who is licensed, but not actively working at this time.  Patient is dependent on daughter for medication management and PRN assistance.  Patient does not have HCBS (daughter Olegario Messier confirms she is not paid.)  Patient does not use home oxygen, nebulizer, inhaler.  She uses a BiPap at home.  Patient's husband is currently ill and is in Rehab in [place].  Patient receives Ritaxan infusions at [facility].  Return appointments are scheduled.  No PT, OT or Speech therapies.  No SNF, LTACH or nursing home care.  Years ago she had home health through [facility].  Daughter believes they would like to have another agency, if home health is needed.  Patient also had IPR at [facility], 04/24/23 - 05/08/23. Unknown if would return.  Daughter confirms [place] [employer] quality scientist [employer] as preferred.  Medications are affordable.  Olegario Messier manages her medications.  Daughter, Olegario Messier, will provide transportation home at discharge.    Patient Address/Phone  [address]  [place] [address]  [phone] (home)     Emergency Contact  Extended Emergency Contact Information  Primary Emergency Contact: Smith,Kathy  Address: [address]           [place] [address] [employer] Phone: [phone]  Relation: Daughter  Preferred language: ENGLISH  Interpreter needed? No  Secondary Emergency Contact: Yaklin,Tom   [employer] Phone: [phone]  Relation: Spouse  Preferred language: ENGLISH  Interpreter needed? No    Healthcare Directive         Transportation  Does the Patient Need Case Management to Arrange Discharge Transport? (ex: facility, ambulance, wheelchair/stretcher, Medicaid, cab, other): No  Will the Patient Use Family Transport?: Yes  Transportation Name, Phone and Availability #1: Hailey Rodgers (Daughter)  [phone] (Mobile)    Expected Discharge Date  12/22/2023     Living Situation Prior to Admission  Living Arrangements  Type of Residence: Home, dependent on others  Living Arrangements: Spouse/significant other, Children (Lives with husband and adult daughter.)  Financial risk analyst / Tub: Psychologist, counselling  How many levels in the  residence?: 1  Can patient live on one level if needed?: Yes  Does residence have entry and/or inside stairs?: Yes (Ramp access)  Assistance needed prior to admit or anticipated on discharge: Yes  Who provides assistance or could if needed?: Daughter, Hailey Rodgers  Are they in good health?: Yes  Can support system provide 24/7 care if needed?: Yes  Level of Function   Prior level of function: Independent  Cognitive Abilities   Cognitive Abilities: Alert and Oriented, Engages in problem solving and planning, Participates in decision making, Continue to Assess, Unable to Assess (Per daughter, Hailey Rodgers.)    Financial Resources  Coverage  Primary Insurance: Medicare  Secondary Insurance: Medicare Supplement  Medication Coverage Medication Coverage: Medicare Part D  Medicare Part D Plan: [employer]  Have you experienced a noticeable increase in your copay costs recently?: No  Are current medications affordable?: Yes  Do You Use a Co-Pay Card or a Medication Assistance Program to Help Manage Medication Costs?: No  Do You Manage Your Own Medications?: No  Who is Responsible for Ordering and Setting up Medications?: Daughter, Hailey Rodgers  Source of Income   Source Of Income: SSI  Financial Assistance Needed?  Not at this time. Continue to assess.    Psychosocial Needs  Mental Health  Mental Health History: No  Substance Use History  Substance Use History Screen: No  Other  Not at this time. Continue to assess.    Current/Previous Services  PCP  Hailey Rodgers [place], [phone], [phone]  Pharmacy    [facility]  [address] [place]. [address]  [place] [address]  Phone: [phone] Fax: [phone]    [id number] [place] [place] - [address]  [address] [place]  [place] [address]  Phone: [phone] Fax: [phone]    [facility] Rory Percy, [place] - [address]  [address]  [place] [address]  Phone: [phone] Fax: [phone]    [facility]  [address], [address]  [place] [address]  Phone: [phone] Fax: [phone]    Durable Medical Equipment   Durable Medical Equipment at home: Rollator, Shower Chair, Single [place], Toilet riser, CPAP/BiPAP  Home Health  Receiving home health: In the past  Agency name: [facility]  Would patient use this agency again?: No  Hemodialysis or Peritoneal Dialysis  Undergoing hemodialysis or peritoneal dialysis: No  Tube/Enteral Feeds  Receive tube/enteral feeds: No  Infusion  Receive infusions: In the past  Where:  [facility]  Infusion company: [facility]  Would patient use this agency again?: Yes  Private Duty  Private duty help used: No  Home and Community Based [employer] and community based services: No  Ryan White  Ryan White: N/A  Hospice  Hospice: No  Outpatient Therapy  PT: No  OT: No  SLP: No  Skilled Nursing Facility/Nursing Home  SNF: No  NH: No  Inpatient Rehab  IPR: In the past  When did patient receive care?: 04/24/23 - 05/08/23  Name of Facility: [facility]  Would patient return for future services?: No  Long-Term Acute Care [place]  LTACH: No  Acute Rodgers Stay  Acute Rodgers Stay: In the past  Was patient's stay within the last 30 days?: No    Daleen Bo, Market researcher, [place] Cell: [phone] or [facility]  ER Weekend Integrated Nurse Case Manager

## 2023-12-19 NOTE — Care Plan
Problem: Discharge Planning  Goal: Participation in plan of care  Outcome: Goal Ongoing  Goal: Knowledge regarding plan of care  Outcome: Goal Ongoing  Goal: Prepared for discharge  Outcome: Goal Ongoing     Problem: Infection, Risk of  Goal: Absence of infection  Outcome: Goal Ongoing  Goal: Knowledge of Infection Control Procedures  Outcome: Goal Ongoing     Problem: High Fall Risk  Goal: High Fall Risk  Outcome: Goal Ongoing     Problem: Skin Integrity  Goal: Skin integrity intact  Outcome: Goal Ongoing  Goal: Healing of skin (Wound & Incision)  Outcome: Goal Ongoing  Goal: Healing of skin (Pressure Injury)  Outcome: Goal Ongoing

## 2023-12-19 NOTE — Care Coordination-Inpatient
Med Private Night 7- [phone] Hailey Rodgers (available on Voalte) will take calls on this patient until 8 am on 12/19/2023. Afterwards, please contact Med-3 team for any questions or concerns. Voalte is the preferred way of communication.     Gust Brooms M.D.  Night AOD  On Voalte ---- 8 pm to 8 am    .

## 2023-12-19 NOTE — Progress Notes
 Profile updated, care plan/education reviewed, and call light within reach.

## 2023-12-20 LAB — POC GLUCOSE
~~LOC~~ BKR POC GLUCOSE: 178 mg/dL — ABNORMAL HIGH (ref 70–100)
~~LOC~~ BKR POC GLUCOSE: 261 mg/dL — ABNORMAL HIGH (ref 70–100)
~~LOC~~ BKR POC GLUCOSE: 155 mg/dL — ABNORMAL HIGH (ref 70–100)
~~LOC~~ BKR POC GLUCOSE: 263 mg/dL — ABNORMAL HIGH (ref 70–100)
~~LOC~~ BKR POC GLUCOSE: 277 mg/dL — ABNORMAL HIGH (ref 70–100)

## 2023-12-20 MED ORDER — IPRATROPIUM-ALBUTEROL 0.5 MG-3 MG(2.5 MG BASE)/3 ML IN NEBU
3 mL | RESPIRATORY_TRACT | 0 refills | Status: DC | PRN
Start: 2023-12-20 — End: 2023-12-21
  Administered 2023-12-20: 15:00:00 3 mL via RESPIRATORY_TRACT

## 2023-12-20 MED ORDER — DOXYCYCLINE HYCLATE 100 MG PO TAB
100 mg | Freq: Two times a day (BID) | ORAL | 0 refills | Status: DC
Start: 2023-12-20 — End: 2023-12-21
  Administered 2023-12-21 (×2): 100 mg via ORAL

## 2023-12-20 MED ORDER — GABAPENTIN 300 MG PO CAP
300 mg | Freq: Once | ORAL | 0 refills | Status: CP
Start: 2023-12-20 — End: ?
  Administered 2023-12-20: 12:00:00 300 mg via ORAL

## 2023-12-20 MED ORDER — PRAMIPEXOLE 0.25 MG PO TAB
.75 mg | Freq: Every day | ORAL | 0 refills | Status: DC
Start: 2023-12-20 — End: 2023-12-21
  Administered 2023-12-20 – 2023-12-21 (×2): 0.75 mg via ORAL

## 2023-12-20 MED ORDER — BENZONATATE 100 MG PO CAP
100 mg | Freq: Three times a day (TID) | ORAL | 0 refills | Status: DC | PRN
Start: 2023-12-20 — End: 2023-12-21

## 2023-12-20 MED ORDER — CEFTRIAXONE INJ 2GM IVP
2 g | INTRAVENOUS | 0 refills | Status: DC
Start: 2023-12-20 — End: 2023-12-21
  Administered 2023-12-21: 04:00:00 2 g via INTRAVENOUS

## 2023-12-20 MED ORDER — POTASSIUM CHLORIDE 20 MEQ PO TBTQ
60 meq | Freq: Once | ORAL | 0 refills | Status: CP
Start: 2023-12-20 — End: ?
  Administered 2023-12-20: 14:00:00 60 meq via ORAL

## 2023-12-20 NOTE — Care Plan
Problem: Discharge Planning  Goal: Participation in plan of care  Outcome: Goal Ongoing  Goal: Knowledge regarding plan of care  Outcome: Goal Ongoing  Goal: Prepared for discharge  Outcome: Goal Ongoing     Problem: Infection, Risk of  Goal: Absence of infection  Outcome: Goal Ongoing  Goal: Knowledge of Infection Control Procedures  Outcome: Goal Ongoing     Problem: High Fall Risk  Goal: High Fall Risk  Outcome: Goal Ongoing     Problem: Skin Integrity  Goal: Skin integrity intact  Outcome: Goal Ongoing  Goal: Healing of skin (Wound & Incision)  Outcome: Goal Ongoing  Goal: Healing of skin (Pressure Injury)  Outcome: Goal Ongoing     Problem: Pain  Goal: Management of pain  Outcome: Goal Ongoing  Goal: Knowledge of pain management  Outcome: Goal Ongoing  Goal: Progress Toward Pain Management Goals  Outcome: Goal Ongoing

## 2023-12-20 NOTE — Progress Notes
Internal Medicine Daily Progress Note      Patient's Name:  Hailey Rodgers MRN: [id number]   Today's Date:  12/20/2023  Admission Date: 12/18/2023  LOS: 1 day    Problem list  Principal Problem:    Hypoxia        Hospital Course:       Assessment and Plan:     Hailey Rodgers is a 72 year old with a history of RA, HFpEF, OSA, HTN, HLP, Type II DM, GAD, Depression, chronic pain, RLS, Vitamin D Deficiency, Hypothyroidism, and urge incontinence presents to the ED with fevers, cough, and confusion found to have acute hypoxic respiratory failure secondary to Influenza A.     12/20/2023 Updates:  - Pt is feeling much better this morning; Satting high 90s on 2L, will try to wean further today  - Continue Tamiflu for Influenza A; Will complete 5 day course of Doxycyline and 3 days of Ceftriaxone for CAP coverage  - PT/OT evaluation       Acute Hypoxic Respiratory Failure   Influenza A  PNA vs Atelectasis   The patient has had a reported cough and URI symptoms for the past two weeks. She has started to fever which prompted her to go to urgent care earlier today. COVID at that time as negative and she was discharged home. Continue to have increasing dyspnea prompting ED evaluation. On arrival she was febrile at 101.7 and O2 sat of 86%. She was placed on 2lpm and satting well. Admission labs largely unremarkable. CXR showes bibasilar opacities consistent with atelectasis vs PNA. COVID and RSV negative. She was flu positive. Started on Tamiflu, Rocephin, and Doxy in the ED.   PLAN:  > Wean oxygen as tolerated  > Continue Tamiflu for 5 days  > Robitussin and Tessalon Perles PRN for cough  > PRN Duonebs with RT for wheezes  > Ceftriaxone for 3 days, with Doxycyline for 5 days for CAP coverage    Acute Toxic encephalopathy, Resolved  Per the daughters history, the patient has had increased confusion and sleepiness as well. The daughter is not sure if she has been sleeping well. She did recently get Rituxan and MTX on 12/23. Daughter reports that this really wipes her out. She is also multiple sedating medications and has a history of OSA on Bipap for which she is compliant. CT of her head on admission without any stroke or brain bleed. UA without evidence of UTI. Suspect she has some component of delirium related to current infection with use of multiple sedating medications.   Plan   -Hold PTA Baclofen, Flexiril  -Delirium precautions      Rheumatoid Arthritis   Chronic Pain   RLS   The patient follow with [facility] and gets MTX and Rituxan. Her last infusion was on 12/23. Per daughter this regimen does make her very fatigued and tired. Dr. Maurine Minister is planning on holding her Rituxan for now.   Plan   -Hold sedating meds as above   -Will restart Pramipexole for RLS   -continue Prednisone 7.5mg  daily and Celebrex  -continue Voltaren gel  -continue folic acid  -Tylenol PRN pain for now      Chronic HFpEF   HLP   IDA   The patient has a history of CHF. She appears compensated on exam and on imaging. EKG with sinus tachycardia. Last ECHO with EF of 60% back in 2023. She is on PTA Bumex, Jardiance, and spirolactone.   Plan   -Daily  standing weights and I&os  -Resume PTA bumex, Jardiacne, and Spirolactone   -Resume Crestor and ASA   -Resume PTA Iron replacement      Type II DM  The patient is currently on Jardiance, Glargine 50 units qhs, SSI, and trulicity. Glucose on admission was 167.   Plan   -Continue PTA glargine and Jardiance   -Start LDCF  -ACHS checks   -Hold PTA trulicity      OSA   Resume home Bipap     Hypothyrodism   Continue PTA levothyroxine      GERD  Continue PTA PPI and Pepcid      Depression   Continue PTA effexor     Diet: Diet Low Sodium  VTE ppx: Enoxaparin  PT/OT: consulted; recommendation: pending  CODE: Full Code    DISPO: Continue admission to Med 3 to further wean oxygen.    Seen and discussed with Verdene Rio, DO    Sherol Dade, MD  PGY-1 Internal Medicine  Available on Voalte       Subjective:     No acute overnight events. Patient is breathing better today but has a persistent cough. She is much more awake and oriented this morning. She is hoping to have her purewick removed and hopes to began ambulating around her room today. She walks with a walker at home.     Review of Systems:  Negative except noted in the HPI.     Objective:   Vital Signs:  BP 115/63  - Pulse 62  - Temp 36.7 ?C (98.1 ?F)  - Ht 170.2 cm (5' 7)  - Wt 92.1 kg (203 lb)  - LMP  (LMP Unknown)  - SpO2 100%  - BMI 31.79 kg/m?     Intake/Output Summary:  (Last 24 hours)    Intake/Output Summary (Last 24 hours) at 12/20/2023 1357  Last data filed at 12/20/2023 1005  Gross per 24 hour   Intake 2017 ml   Output 1650 ml   Net 367 ml           Physical Exam  General appearance: Well-developed, no acute distress, alert and oriented x3  HEENT: Normocephalic, atraumatic, non-icteric sclerae  Lungs: Billateral wheezes, normal respiratory effort on 2L NC  Heart: Normal rate and rhythm, no murmurs, rubs, or gallops.  Abdomen: Soft, not distended, non-tender to palpation, normoactive bowels sounds  Extremities: No lower extremity edema, cyanosis, or clubbing  Neurologic: No focal neurological deficits.  Musculoskeletal: Sensation and motor grossly intact, moving all extremities spontaneously  Skin: Dry and warm, no evidence of ulcers, erythema or rash  Vascular:  2+ radial and dorsalis pedis pulses  Psych: Appropriate mood and affect      Labs:  Recent Labs     12/18/23  2318 12/19/23  0808 12/20/23  0533   HGB 13.7  --  12.8   WBC 7.30  --  3.30*   PLTCT 197  --  162   NA 133*  --  137   K 4.2  --  3.5   CL 96*  --  102   CO2 25  --  24   BUN 19  --  27*   CR 0.79  --  0.82   GLU 167*  --  172*   CA 9.8  --  8.9   MG  --  2.2 2.1   PO4  --  3.4 3.9   ALBUMIN 4.7  --  3.9   AST 27  --  35   ALT 22  --  26   ALKPHOS 112*  --  95   TOTBILI 0.6  --  0.3       Meds:  Scheduled Meds:aspirin chewable tablet 81 mg, 81 mg, Oral, QHS  bumetanide (BUMEX) tablet 1 mg, 1 mg, Oral, QDAY  calcium carbonate (OS-CAL) tablet 1,250 mg, 1,250 mg, Oral, BID  cefTRIAXone (ROCEPHIN) IVP 2 g, 2 g, Intravenous, Q24H*  celecoxib (CeleBREX) capsule 200 mg, 200 mg, Oral, BID  diclofenac sodium (VOLTAREN) 1 % topical gel 4 g, 4 g, Topical, TID  doxycycline hyclate (VIBRACIN) tablet 100 mg, 100 mg, Oral, BID  empagliflozin (JARDIANCE) tablet 25 mg, 25 mg, Oral, QDAY  enoxaparin (LOVENOX) syringe 40 mg, 40 mg, Subcutaneous, QDAY(21)  famotidine (PEPCID) tablet 40 mg, 40 mg, Oral, QHS  ferrous sulfate (FEOSOL) tablet 325 mg, 325 mg, Oral, BID  folic acid (FOLVITE) tablet 4 mg, 4 mg, Oral, QDAY  insulin aspart (U-100) (NOVOLOG FLEXPEN U-100 INSULIN) injection PEN 0-6 Units, 0-6 Units, Subcutaneous, ACHS (22)  insulin glargine (LANTUS SOLOSTAR U-100 INSULIN) injection PEN 50 Units, 50 Units, Subcutaneous, QHS(22)  lactobacillus rhamnosus GG (CULTURELLE) 15 billion cell capsule 1 capsule, 1 capsule, Oral, QDAY  levothyroxine (SYNTHROID) tablet 88 mcg, 88 mcg, Oral, QAM8  loratadine (CLARITIN) tablet 10 mg, 10 mg, Oral, QDAY  mirabegron (MYRBETRIQ) ER tablet 25 mg, 25 mg, Oral, QDAY  oseltamivir (TAMIFLU) capsule 75 mg, 75 mg, Oral, BID  pantoprazole DR (PROTONIX) tablet 40 mg, 40 mg, Oral, BID  pramipexole (MIRAPEX) tablet 0.75 mg, 0.75 mg, Oral, QDAY (See eMAR)  predniSONE (DELTASONE) tablet 7.5 mg, 7.5 mg, Oral, QDAY  rosuvastatin (CRESTOR) tablet 20 mg, 20 mg, Oral, QDAY  spironolactone (ALDACTONE) tablet 25 mg, 25 mg, Oral, QDAY  venlafaxine XR (EFFEXOR XR) capsule 150 mg, 150 mg, Oral, QDAY    Continuous Infusions:  PRN and Respiratory Meds:acetaminophen Q6H PRN, albuterol-ipratropium Q4H PRN, benzonatate TID PRN, dextromethorphan/guaiFENesin  (ROBITUSSIN-DM) oral syrup Q4H PRN, dextrose 50% (D50) IV PRN, melatonin QHS PRN, nitroglycerin Q5 MIN PRN, nystatin BID PRN, ondansetron Q6H PRN **OR** ondansetron (ZOFRAN) IV Q6H PRN, polyethylene glycol 3350 QDAY PRN, sennosides-docusate sodium QDAY PRN        Wounds:  Wounds Moisture associated skin damage Right;Left Breast (Active)   Initial Assess Date/Initial Assess Time: 12/19/23 0644   Wound Type: Moisture associated skin damage  Orientation: Right;Left  Location: Breast      Assessments 12/19/2023  6:00 AM 12/20/2023  9:00 AM   Wound Assessment Pink;Moist Dry   Peri-wound Assessment Dry;Intact Intact   Wound Drainage Amount None None   Wound Dressing Status None/open to air None/open to air       No associated orders.        Nutrition:  Malnutrition Details:

## 2023-12-21 ENCOUNTER — Encounter: Admit: 2023-12-21 | Discharge: 2023-12-21 | Payer: MEDICARE

## 2023-12-21 DIAGNOSIS — Z888 Allergy status to other drugs, medicaments and biological substances status: Secondary | ICD-10-CM

## 2023-12-21 DIAGNOSIS — Z7984 Long term (current) use of oral hypoglycemic drugs: Secondary | ICD-10-CM

## 2023-12-21 DIAGNOSIS — Z90710 Acquired absence of both cervix and uterus: Secondary | ICD-10-CM

## 2023-12-21 DIAGNOSIS — E034 Atrophy of thyroid (acquired): Secondary | ICD-10-CM

## 2023-12-21 DIAGNOSIS — I2510 Atherosclerotic heart disease of native coronary artery without angina pectoris: Secondary | ICD-10-CM

## 2023-12-21 DIAGNOSIS — Z9049 Acquired absence of other specified parts of digestive tract: Secondary | ICD-10-CM

## 2023-12-21 DIAGNOSIS — K219 Gastro-esophageal reflux disease without esophagitis: Secondary | ICD-10-CM

## 2023-12-21 DIAGNOSIS — Z8616 Personal history of COVID-19: Secondary | ICD-10-CM

## 2023-12-21 DIAGNOSIS — Z1152 Encounter for screening for COVID-19: Secondary | ICD-10-CM

## 2023-12-21 DIAGNOSIS — K760 Fatty (change of) liver, not elsewhere classified: Secondary | ICD-10-CM

## 2023-12-21 DIAGNOSIS — I110 Hypertensive heart disease with heart failure: Secondary | ICD-10-CM

## 2023-12-21 DIAGNOSIS — G4733 Obstructive sleep apnea (adult) (pediatric): Secondary | ICD-10-CM

## 2023-12-21 DIAGNOSIS — E7849 Other hyperlipidemia: Secondary | ICD-10-CM

## 2023-12-21 DIAGNOSIS — D849 Immunodeficiency, unspecified: Secondary | ICD-10-CM

## 2023-12-21 DIAGNOSIS — Z91040 Latex allergy status: Secondary | ICD-10-CM

## 2023-12-21 DIAGNOSIS — E669 Obesity, unspecified: Secondary | ICD-10-CM

## 2023-12-21 DIAGNOSIS — G9341 Metabolic encephalopathy: Secondary | ICD-10-CM

## 2023-12-21 DIAGNOSIS — I5032 Chronic diastolic (congestive) heart failure: Secondary | ICD-10-CM

## 2023-12-21 DIAGNOSIS — J9601 Acute respiratory failure with hypoxia: Secondary | ICD-10-CM

## 2023-12-21 DIAGNOSIS — Z885 Allergy status to narcotic agent status: Secondary | ICD-10-CM

## 2023-12-21 DIAGNOSIS — G2581 Restless legs syndrome: Secondary | ICD-10-CM

## 2023-12-21 DIAGNOSIS — Z79890 Hormone replacement therapy (postmenopausal): Secondary | ICD-10-CM

## 2023-12-21 DIAGNOSIS — Z881 Allergy status to other antibiotic agents status: Secondary | ICD-10-CM

## 2023-12-21 DIAGNOSIS — Z6831 Body mass index (BMI) 31.0-31.9, adult: Secondary | ICD-10-CM

## 2023-12-21 DIAGNOSIS — E119 Type 2 diabetes mellitus without complications: Secondary | ICD-10-CM

## 2023-12-21 DIAGNOSIS — G47419 Narcolepsy without cataplexy: Secondary | ICD-10-CM

## 2023-12-21 DIAGNOSIS — J45909 Unspecified asthma, uncomplicated: Secondary | ICD-10-CM

## 2023-12-21 DIAGNOSIS — Z7982 Long term (current) use of aspirin: Secondary | ICD-10-CM

## 2023-12-21 DIAGNOSIS — G928 Other toxic encephalopathy: Secondary | ICD-10-CM

## 2023-12-21 DIAGNOSIS — G8929 Other chronic pain: Secondary | ICD-10-CM

## 2023-12-21 DIAGNOSIS — F32A Depression, unspecified: Secondary | ICD-10-CM

## 2023-12-21 DIAGNOSIS — H547 Unspecified visual loss: Secondary | ICD-10-CM

## 2023-12-21 DIAGNOSIS — E559 Vitamin D deficiency, unspecified: Secondary | ICD-10-CM

## 2023-12-21 DIAGNOSIS — Z794 Long term (current) use of insulin: Secondary | ICD-10-CM

## 2023-12-21 LAB — POC GLUCOSE
~~LOC~~ BKR POC GLUCOSE: 243 mg/dL — ABNORMAL HIGH (ref 70–100)
~~LOC~~ BKR POC GLUCOSE: 241 mg/dL — ABNORMAL HIGH (ref 70–100)
~~LOC~~ BKR POC GLUCOSE: 99 mg/dL (ref 70–100)

## 2023-12-21 MED ORDER — OSELTAMIVIR 75 MG PO CAP
75 mg | ORAL_CAPSULE | Freq: Two times a day (BID) | ORAL | 0 refills | Status: AC
Start: 2023-12-21 — End: ?
  Filled 2023-12-21: qty 5, 3d supply, fill #1

## 2023-12-21 MED ORDER — LIDOCAINE 5 % TP PTMD
2 | Freq: Every day | TOPICAL | 0 refills | Status: DC | PRN
Start: 2023-12-21 — End: 2023-12-21
  Administered 2023-12-21: 10:00:00 2 via TOPICAL

## 2023-12-21 MED ORDER — DOXYCYCLINE HYCLATE 100 MG PO TAB
100 mg | ORAL_TABLET | Freq: Two times a day (BID) | ORAL | 0 refills | 8.00000 days | Status: AC
Start: 2023-12-21 — End: ?
  Filled 2023-12-21: qty 4, 2d supply, fill #1

## 2023-12-21 MED ORDER — POTASSIUM CHLORIDE 20 MEQ PO TBTQ
40 meq | Freq: Once | ORAL | 0 refills | Status: CP
Start: 2023-12-21 — End: ?
  Administered 2023-12-21: 15:00:00 40 meq via ORAL

## 2023-12-21 NOTE — Care Plan
Problem: Discharge Planning  Goal: Participation in plan of care  Outcome: Goal Ongoing  Goal: Knowledge regarding plan of care  Outcome: Goal Ongoing  Goal: Prepared for discharge  Outcome: Goal Ongoing     Problem: Infection, Risk of  Goal: Absence of infection  Outcome: Goal Ongoing  Goal: Knowledge of Infection Control Procedures  Outcome: Goal Ongoing     Problem: High Fall Risk  Goal: High Fall Risk  Outcome: Goal Ongoing     Problem: Skin Integrity  Goal: Skin integrity intact  Outcome: Goal Ongoing  Goal: Healing of skin (Wound & Incision)  Outcome: Goal Ongoing  Goal: Healing of skin (Pressure Injury)  Outcome: Goal Ongoing     Problem: Pain  Goal: Management of pain  Outcome: Goal Ongoing  Goal: Knowledge of pain management  Outcome: Goal Ongoing  Goal: Progress Toward Pain Management Goals  Outcome: Goal Ongoing

## 2023-12-21 NOTE — Progress Notes
RT Exercise Oximetry Note    Name: Hailey Rodgers   MRN: [id number]     DOB: [birth year]      Age: 72 y.o.  Admission Date: 12/18/2023     LOS: 2 days     Date of Service: 12/21/2023         Date performed: 12/21/2023  Time performed: 1120  Time walked: 10 minutes    At Rest While Awake Results    Room air at rest while awake:  SpO2 = 94%    With Exercise Results:    Room air SpO2 with exercise, if needed: 92%      Comments: Patient walked with results noted as above. Gait belt and patient walker use . Patient able to ambulate unit with no shortness of air noted. Returned patient to chair with alarm on and call light in reach. Bedside RN and MD made aware of results.      Therapist: Rejeana Brock, RT

## 2023-12-21 NOTE — Care Plan
Problem: Discharge Planning  Goal: Participation in plan of care  12/21/2023 1349 by Merleen Nicely, RN  Outcome: Goal Achieved  12/21/2023 1348 by Merleen Nicely, RN  Outcome: Goal Ongoing  Goal: Knowledge regarding plan of care  12/21/2023 1349 by Merleen Nicely, RN  Outcome: Goal Achieved  12/21/2023 1348 by Merleen Nicely, RN  Outcome: Goal Ongoing  Goal: Prepared for discharge  12/21/2023 1349 by Merleen Nicely, RN  Outcome: Goal Achieved  12/21/2023 1348 by Merleen Nicely, RN  Outcome: Goal Ongoing     Problem: Infection, Risk of  Goal: Absence of infection  12/21/2023 1349 by Merleen Nicely, RN  Outcome: Goal Achieved  12/21/2023 1348 by Merleen Nicely, RN  Outcome: Goal Ongoing  Goal: Knowledge of Infection Control Procedures  12/21/2023 1349 by Merleen Nicely, RN  Outcome: Goal Achieved  12/21/2023 1348 by Merleen Nicely, RN  Outcome: Goal Ongoing     Problem: High Fall Risk  Goal: High Fall Risk  12/21/2023 1349 by Merleen Nicely, RN  Outcome: Goal Achieved  12/21/2023 1348 by Merleen Nicely, RN  Outcome: Goal Ongoing     Problem: Skin Integrity  Goal: Skin integrity intact  12/21/2023 1349 by Merleen Nicely, RN  Outcome: Goal Achieved  12/21/2023 1348 by Merleen Nicely, RN  Outcome: Goal Ongoing  Goal: Healing of skin (Wound & Incision)  12/21/2023 1349 by Merleen Nicely, RN  Outcome: Goal Achieved  12/21/2023 1348 by Merleen Nicely, RN  Outcome: Goal Ongoing  Goal: Healing of skin (Pressure Injury)  12/21/2023 1349 by Merleen Nicely, RN  Outcome: Goal Achieved  12/21/2023 1348 by Merleen Nicely, RN  Outcome: Goal Ongoing     Problem: Pain  Goal: Management of pain  12/21/2023 1349 by Merleen Nicely, RN  Outcome: Goal Achieved  12/21/2023 1348 by Merleen Nicely, RN  Outcome: Goal Ongoing  Goal: Knowledge of pain management  12/21/2023 1349 by Merleen Nicely, RN  Outcome: Goal Achieved  12/21/2023 1348 by Merleen Nicely, RN  Outcome: Goal Ongoing  Goal: Progress Toward Pain Management Goals  12/21/2023 1349 by Merleen Nicely, RN  Outcome: Goal Achieved  12/21/2023 1348 by Merleen Nicely, RN  Outcome: Goal Ongoing

## 2023-12-21 NOTE — Progress Notes
OCCUPATIONAL THERAPY  NOTE   Name: Hailey Rodgers   MRN: [id number]     DOB: [birth year]      Age: 72 y.o.  Admission Date: 12/18/2023     LOS: 2 days     Date of Service: 12/21/2023      Per discussion with PT, patient reports no concerns with completing activities of daily living with no OT goals identified. OT will discontinue service, please re-consult if the patient has a decline in functional status.       Therapist: Kyung Bacca, OTR/L  Date: 12/21/2023

## 2023-12-21 NOTE — Progress Notes
Internal Medicine Daily Progress Note      Patient's Name:  Hailey Rodgers MRN: [id number]   Today's Date:  12/21/2023  Admission Date: 12/18/2023  LOS: 2 days    Problem list  Principal Problem:    Hypoxia        Hospital Course:       Assessment and Plan:     Hailey Rodgers is a 72 year old with a history of RA, HFpEF, OSA, HTN, HLP, Type II DM, GAD, Depression, chronic pain, RLS, Vitamin D Deficiency, Hypothyroidism, and urge incontinence presents to the ED with fevers, cough, and confusion found to have acute hypoxic respiratory failure secondary to Influenza A.     12/21/2023 Updates:  - Pt is feeling much better this morning; Required oxygen overnight due to poor fitting BiPap mask  - Will obtain exercise oximetry prior to discharge  - Continue Tamiflu for Influenza A; Will complete 5 day course of Doxycyline and 3 days of Ceftriaxone for CAP coverage      Acute Hypoxic Respiratory Failure   Influenza A  PNA vs Atelectasis   The patient has had a reported cough and URI symptoms for the past two weeks. She has started to fever which prompted her to go to urgent care earlier today. COVID at that time as negative and she was discharged home. Continue to have increasing dyspnea prompting ED evaluation. On arrival she was febrile at 101.7 and O2 sat of 86%. She was placed on 2lpm and satting well. Admission labs largely unremarkable. CXR showes bibasilar opacities consistent with atelectasis vs PNA. COVID and RSV negative. She was flu positive. Started on Tamiflu, Rocephin, and Doxy in the ED.   PLAN:  > Patient on room air during wake hours; required 2L ON due to poor fitting BiPap mask  > Complete exercise oximetry prior to discharge  > Continue Tamiflu for 5 days  > Robitussin and Tessalon Perles PRN for cough  > PRN Duonebs with RT for wheezes  > Ceftriaxone for 3 days, with Doxycyline for 5 days for CAP coverage    Acute Toxic encephalopathy, Resolved  Per the daughters history, the patient has had increased confusion and sleepiness as well. The daughter is not sure if she has been sleeping well. She did recently get Rituxan and MTX on 12/23. Daughter reports that this really wipes her out. She is also multiple sedating medications and has a history of OSA on Bipap for which she is compliant. CT of her head on admission without any stroke or brain bleed. UA without evidence of UTI. Suspect she has some component of delirium related to current infection with use of multiple sedating medications.   Plan   -Hold PTA Baclofen, Flexiril  -Delirium precautions      Rheumatoid Arthritis   Chronic Pain   RLS   The patient follow with [facility] and gets MTX and Rituxan. Her last infusion was on 12/23. Per daughter this regimen does make her very fatigued and tired. Dr. Maurine Minister is planning on holding her Rituxan for now.   Plan   -Hold sedating meds as above   -Will restart Pramipexole for RLS   -continue Prednisone 7.5mg  daily and Celebrex  -continue Voltaren gel  -continue folic acid  -Tylenol PRN pain for now      Chronic HFpEF   HLP   IDA   The patient has a history of CHF. She appears compensated on exam and on imaging. EKG with sinus tachycardia. Last  ECHO with EF of 60% back in 2023. She is on PTA Bumex, Jardiance, and spirolactone.   Plan   -Daily standing weights and I&os  -Continue PTA bumex, Jardiacne, and Spirolactone   -Continue Crestor and ASA   -Continue PTA Iron replacement      Type II DM  The patient is currently on Jardiance, Glargine 50 units qhs, SSI, and trulicity. Glucose on admission was 167.   Plan   -Continue PTA glargine and Jardiance   -Start LDCF  -ACHS checks   -Hold PTA trulicity      OSA   Resume home Bipap at 18/14  > Patient to obtain new mask for BiPap when returning home     Hypothyrodism   Continue PTA levothyroxine      GERD  Continue PTA PPI and Pepcid      Depression   Continue PTA effexor     Diet: Diet Low Sodium  VTE ppx: Enoxaparin  PT/OT: consulted; recommendation: pending  CODE: Full Code    DISPO: Continue admission to Med 3 to further wean oxygen.    Seen and discussed with Verdene Rio, DO    Sherol Dade, MD  PGY-1 Internal Medicine  Available on Voalte       Subjective:     No acute overnight events. Patient is feeling better today and is ready to go home. Used oxygen overnight due to uncomfortable Bipap mask. Patient will obtain a new mask once returning home.      Review of Systems:  Negative except noted in the HPI.     Objective:   Vital Signs:  BP 103/58 (BP Source: Arm, Right Upper)  - Pulse 57  - Temp 36.3 ?C (97.3 ?F)  - Ht 170.2 cm (5' 7)  - Wt 92.1 kg (203 lb)  - LMP  (LMP Unknown)  - SpO2 94%  - BMI 31.79 kg/m?     Intake/Output Summary:  (Last 24 hours)    Intake/Output Summary (Last 24 hours) at 12/21/2023 1106  Last data filed at 12/21/2023 0421  Gross per 24 hour   Intake 840 ml   Output --   Net 840 ml           Physical Exam  General appearance: Well-developed, no acute distress, alert and oriented x3  HEENT: Normocephalic, atraumatic, non-icteric sclerae  Lungs: Billateral wheezes, normal respiratory effort on RA  Heart: Normal rate and rhythm, no murmurs, rubs, or gallops.  Abdomen: Soft, not distended, non-tender to palpation, normoactive bowels sounds  Extremities: No lower extremity edema, cyanosis, or clubbing  Neurologic: No focal neurological deficits.  Musculoskeletal: Sensation and motor grossly intact, moving all extremities spontaneously  Skin: Dry and warm, no evidence of ulcers, erythema or rash  Vascular:  2+ radial and dorsalis pedis pulses  Psych: Appropriate mood and affect      Labs:  Recent Labs     12/18/23  2318 12/19/23  0808 12/20/23  0533 12/21/23  0456   HGB 13.7  --  12.8 13.0   WBC 7.30  --  3.30* 3.10*   PLTCT 197  --  162 170   NA 133*  --  137 138   K 4.2  --  3.5 3.7   CL 96*  --  102 99   CO2 25  --  24 24   BUN 19  --  27* 29*   CR 0.79  --  0.82 0.88   GLU 167*  --  172*  125*   CA 9.8  --  8.9 9.2   MG  --  2.2 2.1 2.1 PO4  --  3.4 3.9 5.8*   ALBUMIN 4.7  --  3.9 4.0   AST 27  --  35 37   ALT 22  --  26 27   ALKPHOS 112*  --  95 85   TOTBILI 0.6  --  0.3 0.3       Meds:  Scheduled Meds:aspirin chewable tablet 81 mg, 81 mg, Oral, QHS  bumetanide (BUMEX) tablet 1 mg, 1 mg, Oral, QDAY  calcium carbonate (OS-CAL) tablet 1,250 mg, 1,250 mg, Oral, BID  cefTRIAXone (ROCEPHIN) IVP 2 g, 2 g, Intravenous, Q24H*  celecoxib (CeleBREX) capsule 200 mg, 200 mg, Oral, BID  diclofenac sodium (VOLTAREN) 1 % topical gel 4 g, 4 g, Topical, TID  doxycycline hyclate (VIBRACIN) tablet 100 mg, 100 mg, Oral, BID  empagliflozin (JARDIANCE) tablet 25 mg, 25 mg, Oral, QDAY  enoxaparin (LOVENOX) syringe 40 mg, 40 mg, Subcutaneous, QDAY(21)  famotidine (PEPCID) tablet 40 mg, 40 mg, Oral, QHS  ferrous sulfate (FEOSOL) tablet 325 mg, 325 mg, Oral, BID  folic acid (FOLVITE) tablet 4 mg, 4 mg, Oral, QDAY  insulin aspart (U-100) (NOVOLOG FLEXPEN U-100 INSULIN) injection PEN 0-6 Units, 0-6 Units, Subcutaneous, ACHS (22)  insulin glargine (LANTUS SOLOSTAR U-100 INSULIN) injection PEN 50 Units, 50 Units, Subcutaneous, QHS(22)  lactobacillus rhamnosus GG (CULTURELLE) 15 billion cell capsule 1 capsule, 1 capsule, Oral, QDAY  levothyroxine (SYNTHROID) tablet 88 mcg, 88 mcg, Oral, QAM8  loratadine (CLARITIN) tablet 10 mg, 10 mg, Oral, QDAY  mirabegron (MYRBETRIQ) ER tablet 25 mg, 25 mg, Oral, QDAY  oseltamivir (TAMIFLU) capsule 75 mg, 75 mg, Oral, BID  pantoprazole DR (PROTONIX) tablet 40 mg, 40 mg, Oral, BID  pramipexole (MIRAPEX) tablet 0.75 mg, 0.75 mg, Oral, QDAY (See eMAR)  predniSONE (DELTASONE) tablet 7.5 mg, 7.5 mg, Oral, QDAY  rosuvastatin (CRESTOR) tablet 20 mg, 20 mg, Oral, QDAY  spironolactone (ALDACTONE) tablet 25 mg, 25 mg, Oral, QDAY  venlafaxine XR (EFFEXOR XR) capsule 150 mg, 150 mg, Oral, QDAY    Continuous Infusions:  PRN and Respiratory Meds:acetaminophen Q6H PRN, albuterol-ipratropium Q4H PRN, benzonatate TID PRN, dextromethorphan/guaiFENesin (ROBITUSSIN-DM) oral syrup Q4H PRN, dextrose 50% (D50) IV PRN, lidocaine QDAY PRN, melatonin QHS PRN, nitroglycerin Q5 MIN PRN, nystatin BID PRN, ondansetron Q6H PRN **OR** ondansetron (ZOFRAN) IV Q6H PRN, polyethylene glycol 3350 QDAY PRN, sennosides-docusate sodium QDAY PRN        Wounds:  Wounds Moisture associated skin damage Right;Left Breast (Active)   Initial Assess Date/Initial Assess Time: 12/19/23 0644   Wound Type: Moisture associated skin damage  Orientation: Right;Left  Location: Breast      Assessments 12/19/2023  6:00 AM 12/21/2023  4:21 AM   Wound Assessment Pink;Moist Dry;Pink   Peri-wound Assessment Dry;Intact Intact   Wound Drainage Amount None None   Wound Dressing Status None/open to air None/open to air       No associated orders.        Nutrition:  Malnutrition Details:

## 2023-12-21 NOTE — Progress Notes
PHYSICAL THERAPY  ASSESSMENT /DISCHARGE     Name: Hailey Rodgers   MRN: [id number]     DOB: August 11, 1951      Age: 72 y.o.  Admission Date: 12/18/2023     LOS: 2 days     Date of Service: 12/21/2023      Mobility  Patient Turn/Position: Chair  Progressive Mobility Level: Walk in hallway  Distance Walked (feet): 250 ft  Level of Assistance: Independent  Assistive Device: Walker  Activity Limited By: Fatigue    Subjective  Significant Hospital Events: 72 year old with a history of RA, HFpEF, OSA, HTN, HLP, Type II DM, GAD, Depression, chronic pain, RLS, Vitamin D Deficiency, Hypothyroidism, and urge incontinence presents to the ED with fevers, cough, and confusion found to have acute hypoxic respiratory failure secondary to Influenza A  Mental / Cognitive: Alert;Oriented;Cooperative;Follows commands  Pain: No complaint of pain    Home Living Situation  Lives With: Spouse/significant other (husband in IPR, daughter also with flu)  Type of Home: Mobile home  Entry Stairs: 2;Rail on both sides  In-Home Stairs: No stairs  Patient Owned Equipment: Cane: Single point;Walker: Rollator    Prior Level of Function  Level Of Independence: Independent with ADL and community mobility with endurance limitations;Independent with ADL and community mobility with device  Comments: uses cane in home, rollator outside of home    ROM  R LE ROM: WFL  L LE ROM: WFL    Strength  Strength Position Assessed: Seated  Overall Strength: WFL    Bed Mobility/Transfer  Comments: received sitting up in bedside chair  Transfer Type: Sit to/from Stand  Transfer: Assistance Level: To/From;Bed Side Chair;Modified Independent  Transfer: Assistive Device: Nurse, adult  Transfers: Type Of Assistance: For Safety Considerations  End Of Activity Status: Up in Chair;Nursing Notified;Instructed Patient to Request Assist with Mobility;Instructed Patient to Use Call Light    Balance  Sitting Balance: Static Sitting Balance;Dynamic Sitting Balance;No UE Support  Standing Balance: Static Standing Balance;Dynamic Standing Balance;Independent;2 UE support    Gait  Gait Distance: 250 feet  Gait: Assistance Level: Modified Independent;Safety Considerations  Gait: Assistive Device: Nurse, adult  Gait: Descriptors: Pace: Futures trader;No balance loss;Variable step length  Activity Limited By: Complaint of Fatigue;Patient Choice  Comments: patting O2 sat 96 at end of ambulation on RA  Education  Persons Educated: Patient  Patient Barriers To Learning: None Noted  Teaching Methods: Verbal Instruction;Demonstration  Patient Response: Verbalized Understanding;Return Demonstration  Topics: Plan/Goals of PT Interventions;Importance of Increasing Activity;Continue Ambulation on Own;Ambulate With Nursing;Ambulate with Family    Assessment/Progress  Assessment/Progress: Patient level of independence and safety is consistent with prior level of function and does not require physical therapy intervention;Patient current status and level of safety suggests progression of activity can be achieved with nursing and/or family and does not require physical therapist intervention    AM-PAC 6 Clicks Basic Mobility Inpatient  Turning from your back to your side while in a flat bed without using bed rails: None  Moving from lying on your back to sitting on the side of a flat bed without using bedrails : None  Moving to and from a bed to a chair (including a wheelchair): None  Standing up from a chair using your arms (e.g. wheelchair, or bedside chair): None  To walk in hospital room: None  Climbing 3-5 steps with a railing: A Little  Basic Mobility Inpatient Raw Score: 23  Standardized (T-scale) Score: 50.88    Plan  Plan  Frequency: No Further Treatment    PT Discharge Recommendations  Recommendation: Home/prior living situation  Recommendation for Therapy Post Discharge: Outpatient (would benefit)  Patient Currently Requires Equipment: Owns what is needed    Therapist  Kaleen Odea, PT  Date  12/21/2023

## 2023-12-21 NOTE — Discharge Instructions - Pharmacy
Discharge Summary      Name: Hailey Rodgers  Medical Record Number: [id number]        Account Number:  [id number]  Date Of Birth:  [birth year]                         Age:  72 y.o.  Admit date:  12/18/2023                     Discharge date: 12/21/2023      Discharge Attending:  Verdene Rio, DO    Discharge Summary Completed By: Cain Sieve, MD    Service: Med 3- 2071    Reason for hospitalization:  Hypoxia [R09.02]    Primary Discharge Diagnosis:   Hypoxia    Hospital Diagnoses:  Hospital Problems        Active Problems    * (Principal) Hypoxia   Acute Hypoxic Respiratory Failure 2/2 Influenza A  Acute Metabolic Encephalopathy, Resolved    Present on Admission:   Hypoxia        Significant Past Medical History        Age-related physical debility  Allergic rhinitis  Arthritis  Asthma  Bronchitis  Cancer (HCC)  Cervical stenosis of spinal canal  Coronary artery disease  COVID-19 virus infection      Comment:  06/2020  Disorganized thinking  Diverticulosis  DM (diabetes mellitus) (HCC)  Dyslipidemia  Fatty liver disease, nonalcoholic  Gastroesophageal reflux disease without esophagitis  GERD (gastroesophageal reflux disease)  Hypothyroidism  Hypothyroidism due to acquired atrophy of thyroid  Leg weakness  Lumbar stenosis  Lung nodule      Comment:  10/06/2020 5.  Right upper lobe groundglass nodule                measuring 5 mm which may be  infectious or inflammatory.                Multiple additional sub-5 mm nodules  bilaterally, most                lymph nodes or granulomas. Follow-up CT in 12 months                 recommended for further evaluation if clinically                indicated.   Memory loss  Movement disorder  Narcolepsy  Obesity  On supplemental oxygen therapy      Comment:  Only at night - No longer use on Bi-Pap now  OSA (obstructive sleep apnea)  Other dysphagia  Other hyperlipidemia  Peripheral neuropathy  Polypharmacy  Pseudoseizures  Recurrent major depressive disorder, in partial remission (HCC)  Rheumatoid arthritis (HCC)  Sleep disorder      Comment:  Sleep Apnea & Narcolepsy  Syncope and collapse      Comment:  Age 72 years old  Type 2 diabetes mellitus with hyperglycemia, with long-term current   use of insulin (HCC)  Urinary incontinence  Urinary problem      Comment:  urinary frequency  Vision decreased      Comment:  diplopia    Allergies   Amoxicillin-pot clavulanate, Gemfibrozil, Morphine, Trazodone, Adhesive tape (rosins), Atorvastatin, Latex, Bupropion, Cymbalta [duloxetine], and Levaquin [levofloxacin]    Brief Hospital Course   The patient was admitted and the following issues were addressed during this hospitalization: (with pertinent details including admission exam/imaging/labs).      Ms. Hevey  is a 72 year old with a history of RA, HFpEF, OSA, HTN, HLP, Type II DM, GAD, Depression, chronic pain, RLS, Vitamin D Deficiency, Hypothyroidism, and urge incontinence presents to the ED with fevers, cough, and confusion found to have acute hypoxic respiratory failure secondary to Influenza A.     On 12/19/2023 the patient was brought into the [facility] ED by her daughter due to episodes of confusion after leaving urgent care.  The patient had been dealing with cough and fevers for a couple of days prior to her presentation.  In the ED she was alert and oriented to person only.  She was found to be febrile at 101.7 and had an O2 saturation of 86%.  She was placed on 2 L of nasal cannula began to sat in the mid 90s.  A chest x-ray was performed and showed bibasilar opacities which were concerning for atelectasis versus pneumonia.  She was positive for influenza A.  She was started on Tamiflu, with concern for community-acquired pneumonia she was also started on Rocephin and doxycycline.  She was admitted for further evaluation and management.  Over the course of the next 24 hours, the patient's mental status greatly improved back to her cognitive baseline.  Her oxygen saturations also returned to the mid 90s on room air after receiving DuoNebs and antibiotic and antiviral coverage.  The patient was evaluated by OT/PT while inpatient who recommended that the patient was able to return to home without assistance.    On the day of discharge, the patient was maintaining adequate oxygenation without supplemental oxygen.  An exercise oximetry was performed and the patient not require oxygen with exercise or at rest.  The patient was hemodynamically stable and afebrile.  She was discharged to her home on 12/21/2023.  At follow-up, the patient will likely need a repeat CT chest at the recommendation of her pulmonologist who she last saw in April 2024.    Items Needing Follow Up   Pending items or areas that need to be addressed at follow up:     CT Chest with Pulmonology   Follow-up with pulmonary medicine    Pending Labs and Follow Up Radiology    Pending labs and/or radiology review at this time of discharge are listed below: Please note- any labs with collected status will not have a result; if this area is blank, there are no items for review.   Pending Labs       Order Current Status    EXTRA CLEAR URINE TUBE Collected (12/19/23 0009)    EXTRA URINE GRAY TOP Collected (12/19/23 0009)    POC GLUCOSE In process    POC GLUCOSE In process    POC GLUCOSE In process    POC GLUCOSE In process    POC GLUCOSE In process    POC GLUCOSE In process    POC GLUCOSE In process    POC GLUCOSE In process    POC GLUCOSE In process    POC GLUCOSE In process    POC GLUCOSE In process    POC GLUCOSE In process    POC GLUCOSE In process    POC GLUCOSE In process    URINALYSIS, COMPLETE W/REFLEX TO CULTURE In process              Medications      Medication List      START taking these medications     doxycycline hyclate 100 mg tablet; Commonly known as: VIBRACIN; Dose:  100 mg; Take one tablet by mouth twice daily for 2 days. Indications: a   lower respiratory infection; For: a lower respiratory infection; Quantity:   4 tablet; Refills: 0   oseltamivir 75 mg capsule; Commonly known as: TAMIFLU; Dose: 75 mg; Take   one capsule by mouth twice daily for 5 doses. Indications: the flu; For:   the flu; Quantity: 5 capsule; Refills: 0     CONTINUE taking these medications     acetaminophen 500 mg tablet; Commonly known as: TYLENOL EXTRA STRENGTH;   Dose: 1,000 mg; Take two tablets by mouth every 6 hours as needed. Max of   4,000 mg of acetaminophen in 24 hours.  Indications: pain; For: pain;   Quantity: 90 tablet; Refills: 0   aspirin 81 mg chewable tablet; Dose: 81 mg; Refills: 0   baclofen 10 mg tablet; Commonly known as: LIORESAL; Dose: 10 mg; Take   one tablet by mouth three times daily as needed for Muscle Cramps or Pain.   Indications: muscle spasms caused by a spinal disease; For: muscle spasms   caused by a spinal disease; Quantity: 90 tablet; Refills: 1   BD ULTRA-FINE NANO PEN NEEDLE 32 gauge x 5/32 pen needle; Generic drug:   pen needle, diabetic; Use  as directed 5 times daily; Quantity: 100 each;   Refills: 0   bumetanide 1 mg tablet; Commonly known as: BUMEX; Dose: 1 mg; Take one   tablet by mouth daily. Or as directed; Quantity: 90 tablet; Refills: 3   calcium carbonate 600 mg calcium (1,500 mg) tablet; Commonly known as:   CALTRATE 600; Dose: 2 tablet; Take two tablets by mouth twice daily.;   Quantity: 60 tablet; Refills: 0   * celecoxib 200 mg capsule; Commonly known as: CeleBREX; Dose: 200 mg;   Take one capsule by mouth twice daily. Indications: rheumatoid arthritis;   For: rheumatoid arthritis; Quantity: 60 capsule; Refills: 1   * celecoxib 200 mg capsule; Commonly known as: CeleBREX; Dose: 200 mg;   Take one capsule by mouth twice daily.; Quantity: 60 capsule; Refills: 5   CHOLEcalciferoL (vitamin D3) 50,000 units capsule; Commonly known as:   OPTIMAL D3; Take 1 capsule by mouth once weekly; Quantity: 12 capsule;   Refills: 5   cyclobenzaprine 5 mg tablet; Commonly known as: FLEXERIL; Dose: 5 mg; Take one tablet by mouth three times daily as needed for muscle spasms.;   Quantity: 60 tablet; Refills: 5   diclofenac sodium 1 % topical gel; Commonly known as: ARTHRITIS PAIN   (DICLOFENAC); Dose: 4 g; Apply four g topically to affected area three   times daily.; Quantity: 100 g; Refills: 0   docusate 100 mg capsule; Commonly known as: COLACE; Dose: 200 mg;   Refills: 0   * famotidine 40 mg tablet; Commonly known as: PEPCID; Dose: 40 mg; Take   one tablet by mouth daily.; Quantity: 30 tablet; Refills: 0   * famotidine 40 mg tablet; Commonly known as: PEPCID; Dose: 40 mg; Take   one tablet by mouth at bedtime daily.; Quantity: 90 tablet; Refills: 1   ferrous sulfate 325 mg (65 mg iron) tablet; Commonly known as: FEOSOL;   Dose: 325 mg; Refills: 0   fexofenadine 180 mg tablet; Commonly known as: ALLEGRA; Dose: 180 mg;   Refills: 0   fluocinolone acetonide oil 0.01 % otic drops; Commonly known as:   DERMOTIC OIL; Refills: 0   folic acid 1 mg tablet; Commonly known as: FOLVITE; Dose: 4 mg; Take  four tablets by mouth daily.; Quantity: 360 tablet; Refills: 3   GEMTESA 75 mg tablet; Generic drug: vibegron; Dose: 75 mg; Take one   tablet by mouth at bedtime daily.; Quantity: 30 tablet; Refills: 3   * JARDIANCE 25 mg tablet; Generic drug: empagliflozin; Dose: 1 tablet;   Refills: 0   * JARDIANCE 25 mg tablet; Generic drug: empagliflozin; Dose: 25 mg; Take   one tablet by mouth daily.; Quantity: 90 tablet; Refills: 1   lactobacillus rhamnosus GG 15 billion cell capsule; Commonly known as:   CULTURELLE; Dose: 1 capsule; Refills: 0   LANTUS SOLOSTAR U-100 INSULIN 100 unit/mL (3 mL) subcutaneous PEN;   Generic drug: insulin glargine; Doctor's comments: The pharmacist may   select Lantus Solorstar or Elon Jester based on what is cheaper for   the patient.; Inject fifty Units under the skin every morning AND fifty   Units at bedtime daily.; Quantity: 30 mL; Refills: 5   levothyroxine 88 mcg tablet; Commonly known as: SYNTHROID; Dose: 88 mcg;   Take one tablet by mouth every morning.; Quantity: 90 tablet; Refills: 1   methotrexate PF 25 mg/mL injection; Dose: 25 mg; Inject 1 mL under the   skin every 7 days. Single use vials. Only use for one dose ,then discard.;   Quantity: 24 mL; Refills: 0   multivit-iron-FA-calcium-mins 9 mg iron-400 mcg tablet; Commonly known   as: THERA-M; Dose: 1 tablet; Refills: 0   mupirocin 2 % topical ointment; Commonly known as: BACTROBAN; Apply 1   application in the nostrils twice a day; Quantity: 30 g; Refills: 6   nitroglycerin 0.4 mg tablet; Commonly known as: NITROSTAT; Dose: 0.4 mg;   Place one tablet under tongue every 5 minutes as needed for Chest Pain.   Max 3 tablets, call 911.; Quantity: 25 tablet; Refills: 3   NovoLOG Flexpen U-100 Insulin 100 unit/mL (3 mL) PEN; Generic drug:   insulin aspart (U-100); Dose: 30 Units; Inject thirty Units under the skin   three times daily with meals with a sliding scale 201-250 = +3 units,   251-300 = +6 units, greater than 300 = +9 units, Total Daily units : 117   units; Quantity: 30 mL; Refills: 3   nystatin 100,000 unit/g topical cream; Commonly known as: MYCOSTATIN;   Refills: 0   ondansetron 8 mg rapid dissolve tablet; Commonly known as: ZOFRAN ODT;   Dose: 1 tablet; Refills: 0   * ONETOUCH DELICA PLUS LANCET 33 gauge; Generic drug: lancets 33 gauge;   Dose: 1 each; Use one each as directed before meals and at bedtime.   ICD-10: Type 2 diabetes with hyperglycemia, insulin use E11.65, Z79.4    Indications: type 2 diabetes mellitus; For: type 2 diabetes mellitus;   Quantity: 100 each; Refills: 1   * ONETOUCH DELICA PLUS LANCET 33 gauge; Generic drug: lancets 33 gauge;   Use 3 times per day to monitor capillary blood glucose.; Quantity: 300   each; Refills: 3   ONETOUCH VERIO REFLECT METER kit; Generic drug: blood-glucose meter;   ICD-10: Type 2 diabetes with hyperglycemia, insulin use E11.65, Z79.4    Indications: type 2 diabetes mellitus; For: type 2 diabetes mellitus;   Quantity: 1 each; Refills: 0   ONETOUCH VERIO TEST STRIPS test strip; Generic drug: blood sugar   diagnostic; Dose: 1 strip; Use one strip as directed before meals and at   bedtime. ICD-10: Type 2 diabetes with hyperglycemia, insulin use E11.65,   Z79.4  Indications: type 2 diabetes mellitus;  For: type 2 diabetes   mellitus; Quantity: 100 strip; Refills: 1   * pantoprazole DR 40 mg tablet; Commonly known as: PROTONIX; Dose: 40   mg; Take one tablet by mouth twice daily.; Quantity: 180 tablet; Refills:   3   * pantoprazole DR 40 mg tablet; Commonly known as: PROTONIX; Dose: 40   mg; Take one tablet by mouth daily.; Quantity: 90 tablet; Refills: 3   polyethylene glycol 3350 17 g packet; Commonly known as: MIRALAX; Dose:   17 g; Take one packet by mouth twice daily as needed.; Quantity: 12 each;   Refills: 0   pramipexole 0.75 mg tablet; Commonly known as: MIRAPEX; Dose: 0.75 mg;   Take one tablet by mouth daily.; Quantity: 90 tablet; Refills: 3   predniSONE 5 mg tablet; Commonly known as: DELTASONE; Dose: 7.5 mg; Take   1.5 tablets by mouth daily for 90 days.; Quantity: 135 tablet; Refills: 0   pregabalin 25 mg capsule; Commonly known as: LYRICA; Dose: 25 mg; Take   one capsule by mouth three times daily.; Quantity: 90 capsule; Refills: 5   rosuvastatin 20 mg tablet; Commonly known as: CRESTOR; Dose: 20 mg; Take   one tablet by mouth daily.; Quantity: 90 tablet; Refills: 3   spironolactone 25 mg tablet; Commonly known as: ALDACTONE; Dose: 25 mg;   Take one tablet by mouth daily. Take with food.; Quantity: 90 tablet;   Refills: 3   STIMULANT LAXATIVE PLUS 8.6-50 mg tablet; Generic drug:   sennosides-docusate sodium; Dose: 1 tablet; Take one tablet by mouth   daily.; Quantity: 30 tablet; Refills: 0   triamcinolone acetonide 0.5 % ointment; Refills: 0   TRULICITY 3 mg/0.5 mL injection pen; Generic drug: dulaglutide; Refills:   0   venlafaxine XR 150 mg capsule; Commonly known as: EFFEXOR XR; Dose: 150   mg; Take one capsule by mouth daily.; Quantity: 90 capsule; Refills: 3  * This list has 10 medication(s) that are the same as other medications   prescribed for you. Read the directions carefully, and ask your doctor or   other care provider to review them with you.       Return Appointments and Scheduled Appointments     Scheduled appointments:      Mar 12, 2024 2:15 PM  (Arrive by 1:45 PM)  MRI UPPER EXTREM WOW CONT LEFT with MRI - [facility] (1.5T)  Imaging MRI: [facility] Radiology) [address]  [address]  [place] [address]  [phone]     Apr 07, 2024 2:30 PM  Office visit with Oneal Grout, DO  Rheumatology: [facility] (Internal Medicine) [address]  [address], [address]  [place] [address]  [phone]     Apr 12, 2024 11:30 AM  Infusion with XBM84  Infusion Therapy: [facility] (--) [address] [place] [address]  [phone]     Apr 15, 2024 12:30 PM  Office visit with Rudene Christians, PhD  Neuropsychology: [facility] (Neurology) [address] [place].  [address], [address]  [place] [address]  [phone]     Apr 26, 2024 11:30 AM  Infusion with JOA41  Infusion Therapy: [facility] (--) [address] [place] [address]  [phone]     Sep 07, 2024 1:10 PM  Office visit with Katherine Basset, MD  Neurology: [facility] (Neurology) [address]  [place]  [place] [address]  [phone]            Consults, Procedures, Diagnostics, Micro, Pathology   Consults: None  Surgical Procedures & Dates: None  Significant Diagnostic Studies, Micro and Procedures: none and noted in brief hospital course  Significant Pathology: noted in brief hospital course              Wound:      Wounds Moisture associated skin damage Right;Left Breast (Active)   12/19/23 0644   Wound Type: Moisture associated skin damage   Orientation: Right;Left Location: Breast   Wound Location Comments:    Initial Wound Site Closure:    Initial Dressing Placed:    Initial Cycle:    Initial Suction Setting (mmHg):    Pressure Injury Stages:    Pressure Injury Present Within 24 Hours of Hospital Admission:    If This Pressure Injury Is Suspected to Be Device Related, Please Select the Device::    Is the Wound Open or Closed:    Wound Assessment Dry;Pink 12/21/23 0421   Peri-wound Assessment Intact 12/21/23 0421   Wound Drainage Amount None 12/21/23 0421   Wound Dressing Status None/open to air 12/21/23 0421   Number of days: 2                           Discharge Disposition, Condition   Patient Disposition:   Condition at Discharge: Stable    Code Status   Full Code    Patient Instructions     Activity       Activity as Tolerated   As directed      It is important to keep increasing your activity level after you leave the hospital.  Moving around can help prevent blood clots, lung infection (pneumonia) and other problems.  Gradually increasing the number of times you are up moving around will help you return to your normal activity level more quickly.  Continue to increase the number of times you are up to the chair and walking daily to return to your normal activity level. Begin to work toward your normal activity level at discharge          Diet       Regular Diet   As directed      You have no dietary restriction. Please continue with a healthy balanced diet.             Discharge education provided to patient., Signs and Symptoms: , Education: , and Others Instructions:     Additional Orders: Case Management, Supplies, Home Health     Home Health/DME       None              Signed:  Cain Sieve, MD  12/21/2023      cc:  Primary Care Physician:  Lona Kettle   Verified    Referring physicians:  No Pcp, Na   Additional provider(s):        Did we miss something? If additional records are needed, please fax a request on office letterhead to [phone]. Please include the patient's name, date of birth, fax number and type of information needed. Additional request can be made by email at [email or web address]. For general questions of information about electronic records sharing, call [phone].

## 2024-01-01 ENCOUNTER — Encounter: Admit: 2024-01-01 | Discharge: 2024-01-01 | Payer: MEDICARE

## 2024-01-02 ENCOUNTER — Encounter: Admit: 2024-01-02 | Discharge: 2024-01-02 | Payer: MEDICARE

## 2024-01-02 MED FILL — VENLAFAXINE 150 MG PO CP24: 150 mg | ORAL | 90 days supply | Qty: 90 | Fill #3 | Status: AC

## 2024-01-02 MED FILL — LEVOTHYROXINE 88 MCG PO TAB: 88 mcg | ORAL | 90 days supply | Qty: 90 | Fill #2 | Status: AC

## 2024-01-02 MED FILL — ROSUVASTATIN 20 MG PO TAB: 20 mg | ORAL | 90 days supply | Qty: 90 | Fill #2 | Status: AC

## 2024-01-03 ENCOUNTER — Encounter: Admit: 2024-01-03 | Discharge: 2024-01-03 | Payer: MEDICARE

## 2024-01-04 ENCOUNTER — Encounter: Admit: 2024-01-04 | Discharge: 2024-01-04 | Payer: MEDICARE

## 2024-01-04 MED FILL — PRAMIPEXOLE 0.75 MG PO TAB: 0.75 mg | ORAL | 90 days supply | Qty: 90 | Fill #3 | Status: AC

## 2024-01-04 MED FILL — LANTUS SOLOSTAR U-100 INSULIN 100 UNIT/ML (3 ML) SC INPN: 1003 unit/mL (3 mL) | SUBCUTANEOUS | 30 days supply | Qty: 30 | Fill #4 | Status: AC

## 2024-01-04 MED FILL — PREGABALIN 25 MG PO CAP: 25 mg | ORAL | 30 days supply | Qty: 90 | Fill #4 | Status: AC

## 2024-01-04 MED FILL — GEMTESA 75 MG PO TAB: 75 mg | ORAL | 30 days supply | Qty: 30 | Fill #4 | Status: AC

## 2024-01-04 MED FILL — FOLIC ACID 1 MG PO TAB: 1 mg | ORAL | 30 days supply | Qty: 120 | Fill #4 | Status: AC

## 2024-01-04 MED FILL — INSULIN ASPART 100 UNIT/ML SC FLEXPEN: 1003 unit/mL (3 mL) | SUBCUTANEOUS | 25 days supply | Qty: 30 | Fill #3 | Status: AC

## 2024-01-29 ENCOUNTER — Encounter: Admit: 2024-01-29 | Discharge: 2024-01-29 | Payer: MEDICARE

## 2024-01-29 MED FILL — DICYCLOMINE 20 MG PO TAB: 20 mg | ORAL | 5 days supply | Status: CN

## 2024-01-29 MED FILL — ONDANSETRON 4 MG PO TBDI: 4 mg | ORAL | 3 days supply | Status: CN

## 2024-02-05 ENCOUNTER — Encounter: Admit: 2024-02-05 | Discharge: 2024-02-05 | Payer: MEDICARE

## 2024-02-08 ENCOUNTER — Encounter: Admit: 2024-02-08 | Discharge: 2024-02-08 | Payer: MEDICARE

## 2024-02-17 ENCOUNTER — Encounter: Admit: 2024-02-17 | Discharge: 2024-02-17 | Payer: MEDICARE

## 2024-02-18 ENCOUNTER — Encounter: Admit: 2024-02-18 | Discharge: 2024-02-18 | Payer: MEDICARE

## 2024-02-19 MED FILL — MUPIROCIN 2 % TP OINT: 2 % | TOPICAL | 30 days supply | Qty: 22 | Fill #6 | Status: AC

## 2024-02-19 MED FILL — CELECOXIB 200 MG PO CAP: 200 mg | ORAL | 30 days supply | Qty: 60 | Fill #2 | Status: AC

## 2024-02-19 MED FILL — GEMTESA 75 MG PO TAB: 75 mg | ORAL | 30 days supply | Qty: 30 | Fill #1 | Status: AC

## 2024-02-19 MED FILL — JARDIANCE 25 MG PO TAB: 25 mg | ORAL | 90 days supply | Qty: 90 | Fill #2 | Status: AC

## 2024-02-19 MED FILL — PREGABALIN 25 MG PO CAP: 25 mg | ORAL | 30 days supply | Qty: 90 | Fill #5 | Status: AC

## 2024-03-01 ENCOUNTER — Encounter: Admit: 2024-03-01 | Discharge: 2024-03-01 | Payer: MEDICARE

## 2024-03-02 ENCOUNTER — Encounter: Admit: 2024-03-02 | Discharge: 2024-03-02 | Payer: MEDICARE

## 2024-03-02 MED FILL — LANTUS SOLOSTAR U-100 INSULIN 100 UNIT/ML (3 ML) SC INPN: 1003 unit/mL (3 mL) | SUBCUTANEOUS | 30 days supply | Qty: 30 | Fill #5 | Status: CP

## 2024-03-02 MED FILL — INSULIN ASPART 100 UNIT/ML SC FLEXPEN: 1003 unit/mL (3 mL) | SUBCUTANEOUS | 25 days supply | Qty: 30 | Fill #4 | Status: CP

## 2024-03-08 ENCOUNTER — Encounter: Admit: 2024-03-08 | Discharge: 2024-03-08 | Payer: MEDICARE

## 2024-03-11 ENCOUNTER — Encounter: Admit: 2024-03-11 | Discharge: 2024-03-11 | Payer: MEDICARE

## 2024-03-11 MED FILL — METHOCARBAMOL 500 MG PO TAB: 500 mg | ORAL | 30 days supply | Qty: 90 | Fill #1 | Status: AC

## 2024-03-11 MED FILL — VENLAFAXINE 75 MG PO CP24: 75 mg | ORAL | 30 days supply | Qty: 90 | Fill #1 | Status: AC

## 2024-03-12 ENCOUNTER — Encounter: Admit: 2024-03-12 | Discharge: 2024-03-12 | Payer: MEDICARE

## 2024-03-15 ENCOUNTER — Encounter: Admit: 2024-03-15 | Discharge: 2024-03-15 | Payer: MEDICARE

## 2024-03-15 MED FILL — GEMTESA 75 MG PO TAB: 75 mg | ORAL | 30 days supply | Qty: 30 | Fill #1 | Status: AC

## 2024-03-15 MED FILL — CELECOXIB 200 MG PO CAP: 200 mg | ORAL | 30 days supply | Qty: 60 | Fill #2 | Status: AC

## 2024-03-15 MED FILL — JARDIANCE 25 MG PO TAB: 25 mg | ORAL | 90 days supply | Qty: 90 | Fill #2 | Status: AC

## 2024-03-15 MED FILL — MUPIROCIN 2 % TP OINT: 2 % | TOPICAL | 30 days supply | Qty: 22 | Fill #6 | Status: AC

## 2024-03-16 ENCOUNTER — Encounter: Admit: 2024-03-16 | Discharge: 2024-03-16 | Payer: MEDICARE

## 2024-03-18 ENCOUNTER — Encounter: Admit: 2024-03-18 | Discharge: 2024-03-18 | Payer: MEDICARE

## 2024-03-18 MED FILL — JARDIANCE 25 MG PO TAB: 25 mg | ORAL | 90 days supply | Qty: 90 | Fill #2 | Status: AC

## 2024-03-18 MED FILL — VENLAFAXINE 75 MG PO CP24: 75 mg | ORAL | 30 days supply | Qty: 90 | Fill #1 | Status: AC

## 2024-03-18 MED FILL — GEMTESA 75 MG PO TAB: 75 mg | ORAL | 30 days supply | Qty: 30 | Fill #1 | Status: AC

## 2024-03-18 MED FILL — CELECOXIB 200 MG PO CAP: 200 mg | ORAL | 30 days supply | Qty: 60 | Fill #2 | Status: AC

## 2024-03-18 MED FILL — METHOCARBAMOL 500 MG PO TAB: 500 mg | ORAL | 30 days supply | Qty: 90 | Fill #1 | Status: AC

## 2024-03-18 MED FILL — MUPIROCIN 2 % TP OINT: 2 % | TOPICAL | 15 days supply | Qty: 15 | Fill #6 | Status: AC

## 2024-03-22 ENCOUNTER — Encounter: Admit: 2024-03-22 | Discharge: 2024-03-22

## 2024-03-25 ENCOUNTER — Encounter: Admit: 2024-03-25 | Discharge: 2024-03-25

## 2024-03-26 ENCOUNTER — Encounter: Admit: 2024-03-26 | Discharge: 2024-03-26

## 2024-03-28 ENCOUNTER — Encounter: Admit: 2024-03-28 | Discharge: 2024-03-28

## 2024-03-29 ENCOUNTER — Encounter: Admit: 2024-03-29 | Discharge: 2024-03-29

## 2024-03-30 ENCOUNTER — Encounter: Admit: 2024-03-30 | Discharge: 2024-03-30

## 2024-03-30 MED FILL — NITROFURANTOIN MONOHYD/M-CRYST 100 MG PO CAP: 100 mg | ORAL | 2 days supply | Qty: 9 | Fill #1 | Status: CP

## 2024-03-30 MED FILL — PREGABALIN 50 MG PO CAP: 50 mg | ORAL | 30 days supply | Qty: 90 | Fill #1 | Status: CP

## 2024-03-30 MED FILL — PRAMIPEXOLE 0.75 MG PO TAB: 0.75 mg | ORAL | 30 days supply | Qty: 60 | Fill #1 | Status: CP

## 2024-03-30 MED FILL — OXYCODONE 5 MG PO TAB: 5 mg | 5 days supply | Qty: 30 | Fill #1 | Status: CP

## 2024-03-30 MED FILL — FLUTICASONE PROPIONATE 50 MCG/ACTUATION NA SPSN: 50 mcg/actuation | 30 days supply | Qty: 16 | Fill #1 | Status: CP

## 2024-03-30 MED FILL — VENLAFAXINE 75 MG PO CP24: 75 mg | ORAL | 30 days supply | Qty: 90 | Fill #1 | Status: CP

## 2024-03-30 MED FILL — TAMSULOSIN 0.4 MG PO CAP: 0.4 mg | ORAL | 15 days supply | Qty: 30 | Fill #1 | Status: CP

## 2024-03-30 MED FILL — PREDNISONE 2.5 MG PO TAB: 2.5 mg | ORAL | 30 days supply | Qty: 90 | Fill #1 | Status: CP

## 2024-03-30 MED FILL — INSULIN LISPRO 100 UNIT/ML SC INPN: 100 unit/mL | SUBCUTANEOUS | 50 days supply | Qty: 15 | Fill #1 | Status: CP

## 2024-03-30 MED FILL — INSULIN GLARGINE 100 UNIT/ML (3 ML) SC INJ PEN: 100 unit/mL (3 mL) | SUBCUTANEOUS | 37 days supply | Qty: 15 | Fill #1 | Status: CP

## 2024-03-30 MED FILL — FLUCONAZOLE 200 MG PO TAB: 200 mg | ORAL | 10 days supply | Qty: 10 | Fill #1 | Status: CP

## 2024-03-31 ENCOUNTER — Encounter: Admit: 2024-03-31 | Discharge: 2024-03-31

## 2024-03-31 NOTE — Progress Notes
 Pharmacy Benefits Investigation    Medication name: dulaglutide (TRULICITY) 1.5 mg/0.5 mL injection pen  Medication status: new    The insurance requires a prior authorization for the medication. Because the prescriber is outside the health system and medical records are unavailable, the specialty pharmacy team is unable to submit the prior authorization. The prior authorization request was sent to the patient's provider. Will follow up in 2 business days.    First attempt faxing the provider.     Hailey Rodgers  Specialty Pharmacy Patient Advocate

## 2024-04-01 ENCOUNTER — Encounter: Admit: 2024-04-01 | Discharge: 2024-04-01

## 2024-04-01 MED FILL — METHOCARBAMOL 500 MG PO TAB: 500 mg | ORAL | 30 days supply | Qty: 90 | Fill #1 | Status: AC

## 2024-04-02 ENCOUNTER — Encounter: Admit: 2024-04-02 | Discharge: 2024-04-02 | Payer: MEDICARE

## 2024-04-03 ENCOUNTER — Encounter: Admit: 2024-04-03 | Discharge: 2024-04-03 | Payer: MEDICARE

## 2024-04-05 ENCOUNTER — Encounter: Admit: 2024-04-05 | Discharge: 2024-04-05 | Payer: MEDICARE

## 2024-04-06 ENCOUNTER — Encounter: Admit: 2024-04-06 | Discharge: 2024-04-06 | Payer: MEDICARE

## 2024-04-07 ENCOUNTER — Ambulatory Visit: Admit: 2024-04-07 | Discharge: 2024-04-08 | Payer: MEDICARE

## 2024-04-07 ENCOUNTER — Encounter: Admit: 2024-04-07 | Discharge: 2024-04-07 | Payer: MEDICARE

## 2024-04-07 DIAGNOSIS — I7300 Raynaud's syndrome without gangrene: Secondary | ICD-10-CM

## 2024-04-07 DIAGNOSIS — M069 Rheumatoid arthritis, unspecified: Secondary | ICD-10-CM

## 2024-04-07 DIAGNOSIS — R768 Other specified abnormal immunological findings in serum: Secondary | ICD-10-CM

## 2024-04-07 DIAGNOSIS — M150 Primary generalized (osteo)arthritis: Secondary | ICD-10-CM

## 2024-04-07 DIAGNOSIS — M0579 Rheumatoid arthritis with rheumatoid factor of multiple sites without organ or systems involvement: Secondary | ICD-10-CM

## 2024-04-07 DIAGNOSIS — D849 Immunodeficiency, unspecified: Secondary | ICD-10-CM

## 2024-04-07 DIAGNOSIS — Z79899 Other long term (current) drug therapy: Secondary | ICD-10-CM

## 2024-04-07 DIAGNOSIS — Z8719 Personal history of other diseases of the digestive system: Secondary | ICD-10-CM

## 2024-04-07 MED ORDER — PREDNISONE 5 MG PO TAB
ORAL_TABLET | ORAL | 0 refills | Status: AC
Start: 2024-04-07 — End: ?
  Filled 2024-04-07: qty 21, 28d supply, fill #1

## 2024-04-07 MED FILL — FOLIC ACID 1 MG PO TAB: 1 mg | ORAL | 30 days supply | Qty: 120 | Fill #5 | Status: CP

## 2024-04-07 MED FILL — FAMOTIDINE 40 MG PO TAB: 40 mg | ORAL | 90 days supply | Qty: 90 | Fill #2 | Status: CP

## 2024-04-07 MED FILL — BACLOFEN 10 MG PO TAB: 10 mg | ORAL | 30 days supply | Qty: 90 | Fill #2 | Status: CP

## 2024-04-07 MED FILL — MUPIROCIN 2 % TP OINT: 2 % | TOPICAL | 30 days supply | Qty: 22 | Fill #6 | Status: CP

## 2024-04-07 MED FILL — VENLAFAXINE 150 MG PO CP24: 150 mg | ORAL | 90 days supply | Qty: 90 | Fill #4 | Status: CP

## 2024-04-07 MED FILL — CELECOXIB 200 MG PO CAP: 200 mg | ORAL | 30 days supply | Qty: 60 | Fill #2 | Status: CP

## 2024-04-07 MED FILL — PANTOPRAZOLE 40 MG PO TBEC: 40 mg | ORAL | 90 days supply | Qty: 90 | Fill #3 | Status: CP

## 2024-04-07 NOTE — Progress Notes
 History of Present Illness  Hailey Rodgers Rodgers is a 73 y.o. 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      Patient is a Caucasian female with history of DM2, hypothyroidism, pseudoseizure and depression who presented to the hospital in 09/2020 after a fall and joint pain. Affected joints include hands and wrists (wrists radiating down to her fingertips with the right hand more than the left hand), right shoulder, left knee, and the bottoms of her heels. Pain had inflammatory characteristics by history (warmth and swelling). On physical exam, patient had synovitis on physical exam and possibly tenosynovitis in the wrists. Patient also had diffuse swelling in both hands. Labs showed an elevated sedimentation rate (43) and CRP (1.98 mg/dL). Labs also showed elevated rheumatoid factor (>1200) and CCP antibody (230.3). Right hand Xray, bilateral wrist Xrays, and bilateral knee Xrays showed degenerative changes but no evidence of inflammatory arthritis. Patient initiated on prednisone and methotrexate. As patient was still having a significant amount of pain initiating on methotrexate, patient was also started on Humira.     In 04/2022.  Her clinical disease activity index was 28 that visit.  She had been on Orencia subcutaneous since 12/2021 without any benefit. Previously noted findings of fibrosis when she was hospitalized for urosepsis (CT results were shown to us  via her phone at that visit) were not seen on repeat HRCT that visit. Raynaud's was stable, not known to have features of systemic sclerosis or inflammatory myositis in the past. Discussed to either switching abatacept infusion vs rituximab. With shared decision making, prefer to save rituximab as last reserve. Can finish remaining abatacept SQ until can start abatacept infusion (patient weighs 97.1 kg so we used dosing for 60 to 100 kg: 750 mg (0, 2 weeks and 4 weeks after the initial infusion, and every 4 weeks thereafter).  Methotrexate 20 mg p.o. once weekly was continued.      IMAGING/SEROLOGIES:  Positive: CCP (>300), RF (>1200)    Negative/normal: Cardiolipin, SPEP, immunofixation, dsDNA, antimitochondrial, anti-Smith, anti-RNP, anti-SSA/SSB, centromere, MPO, PR-3, C3/C4, Jo 1, SCL 70, RNA polymerase 3, beta-2 glycoprotein, mild marker panel    08/2022 hand x-ray bilateral: No erosions or costochondral cells.  Scattered degenerative arthritis in both hands most severe at the first left [facility] joint with recommendation of the trapezium and medial subluxation of the first metacarpal.  Moderate degenerative changes in the right first [facility] joint. Otherwise mild scattered degenerative changes in both hands    01/2023 foot x-ray bilateral.  No acute fracture or malalignment of either foot.  No aggressive bony destructive process.  Achilles and plantar calcaneal enthesophytes bilaterally    PFT 02/2023  The FEV1 and FVC are reduced.  The FEV1/FVC ratio and FEF25-75% are normal.   Residual volume is decreased.   Total lung capacity (performed by body plethysmography) is moderately decreased.   Transport factor (Diffusion Capacity) not corrected for hemoglobin is normal.   RV/TLC is normal.   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    EXTRA-ARTICULAR MANIFESTATIONS:  Patient established with Doctor Gerrianne Krauss for RA-ILD     PREVIOUS MEDICATIONS:  Humira 40 mg SQ q14d then has tried q7d (11/2020 - 05/2021) she continued to have flares   Enbrel 50 mg SQ weekly 06/2021 - 09/2021 - ineffective   Abatacept 125mg  SQ weekly 09/2021 -06/2022, ineffective   abatacept infusion (patient weighs 97.1 kg so we used dosing  for 60 to 100 kg: 750 mg (0, 2 weeks and 4 weeks after the initial infusion, and every 4 weeks thereafter), first dose 07/22/2022. 01/08/23 last abatacept infusion.      CURRENT MEDICATIONS:  Methotrexate 20 mg by mouth weekly (10/2020 - 05/2023).  Increase to 25 mg subcutaneous in 05/2023  Rituximab infusions 1 g days 1 and 15 every 6 months 03/03/23 and 03/17/2023, moved to every 4 months starting in 06/2023 due to RA flares (held in 11/2023)  Folic acid 4 mg daily (was having oral sores)  Prednisone 7.5 mg    Interval history:  Last seen in 11/2023.  At that time he was continuing to have joint pain despite rituximab infusions every 4 months and methotrexate 25 mg subcutaneous weekly.  He did not feel as though the rituximab infusions were helping. the plan was to hold additional rituximab infusions and obtain MRI to evaluate for synovitis.    Since last being seen she was admitted to [facility] s/p fall requiring L3-L4 and L4-L5 osteotomy, L2 decompression laminectomy, L2-L3 foraminotomies and facetectomies T12-S1 arthrodesis with pelvic fixation to iliac bone on 3/13.  She was eventually discharged to physical therapy.  During this time her methotrexate was continued and she was unable to obtain an upper extremity MRI.    She will occasionally have trigger fingers that will get caught.  She feels like her hands have been about the same since last being seen.  She continues to have some aches in her hands but feels that they have been status quo.  She does endorse that she is able to make a fist and feels her joints have not been as swollen.  She does have some morning stiffness that lasts about an hour.  She will have worsening of her joint pain with prolonged activity.    Of note while being inpatient, she has been tapered off many of her medication including all diuretics, losartan and spironolactone.  She has had improvement in her bilateral lower extremity edema as well as in her hands.    She denies fevers, chills, night sweats, recent weight loss.    Past medical history:  Type 2 diabetes, nonalcoholic fatty liver disease, history of diverticulitis (remote), hypothyroidism, pseudoseizure, obstructive sleep apnea, loop recorder present, osteoarthritis multiple joints     Review of Systems  Negative unless otherwise specified above.      Allergies   Allergen Reactions    Amoxicillin-Pot Clavulanate HIVES, ITCHING and URTICARIA    Gemfibrozil HIVES    Morphine MENTAL STATUS CHANGES     Patient became unresponsive within 1 minute of 4 mg of morphine.  Did not become apneic.  Required narcan    Trazodone HALLUCINATIONS    Adhesive Tape (Rosins) RASH    Atorvastatin SEE COMMENTS     States it caused her to have a fatty liver.   Reaction: LFT elevation; Comment: LIPITOR  Reaction: LFT elevation; Comment: LIPITOR  Reaction: LFT elevation; Comment: LIPITOR  Reaction: LFT elevation; Comment: LIPITOR      Latex RASH    Bupropion SEE COMMENTS     Allergy recorded in SMS: WELLBUTRIN~Reactions: ABNORMAL SEIZUR    Cymbalta [Duloxetine] UNKNOWN    Levaquin [Levofloxacin] DIARRHEA     Vitals:    04/07/24 1500   BP: 118/64   Pulse: 89   Temp: 36.4 ?C (97.5 ?F)   Resp: 19   SpO2: 94%        Physical Exam  Constitutional: A&Ox3, NAD  HEENT: at/nc, EOMI, sclera normal, MMM  Cardiac: RRR, no murmurs noted   Pulm: CTAB, no wheeze, rales or rhonchi   Abd: soft, non tender, non distended,   Neuro: No focal deficits, motor and sensory intact  Ext: Stasis dermatitis present from mid shin and distally.  Trace edema present in the bilateral lower extremities  Skin: Bilateral shins with stasis dermatitis changes with trace edema present  MSK: No overt synovitis noted in all extremities.  Overall she has multiple tender joints present, please see homunculus.  She has widespread myofascial tenderness, particularly in the upper arms  Physical Exam          Comprehensive Metabolic Profile    Lab Results   Component Value Date/Time    NA 138 12/21/2023 04:56 AM    K 3.7 12/21/2023 04:56 AM    CL 99 12/21/2023 04:56 AM    CO2 24 12/21/2023 04:56 AM    GAP 15 (H) 12/21/2023 04:56 AM    BUN 29 (H) 12/21/2023 04:56 AM    CR 0.88 12/21/2023 04:56 AM    GLU 125 (H) 12/21/2023 04:56 AM    Lab Results   Component Value Date/Time    CA 9.2 12/21/2023 04:56 AM    PO4 5.8 (H) 12/21/2023 04:56 AM    ALBUMIN 4.0 12/21/2023 04:56 AM    TOTPROT 6.8 12/21/2023 04:56 AM    ALKPHOS 85 12/21/2023 04:56 AM    AST 37 12/21/2023 04:56 AM    ALT 27 12/21/2023 04:56 AM    TOTBILI 0.3 12/21/2023 04:56 AM    GFR >60 12/21/2023 04:56 AM    GFRAA >60 11/11/2020 07:21 AM        CBC w diff    Lab Results   Component Value Date/Time    WBC 3.10 (L) 12/21/2023 04:56 AM    RBC 4.58 12/21/2023 04:56 AM    HGB 13.0 12/21/2023 04:56 AM    HCT 39.7 12/21/2023 04:56 AM    MCV 86.7 12/21/2023 04:56 AM    MCH 28.5 12/21/2023 04:56 AM    MCHC 32.8 12/21/2023 04:56 AM    RDW 13.5 12/21/2023 04:56 AM    PLTCT 170 12/21/2023 04:56 AM    MPV 8.5 12/21/2023 04:56 AM    Lab Results   Component Value Date/Time    NEUT 32 (L) 12/21/2023 04:56 AM    ANC 1.00 (L) 12/21/2023 04:56 AM    LYMA 48 (H) 12/21/2023 04:56 AM    ALC 1.50 12/21/2023 04:56 AM    MONA 17 (H) 12/21/2023 04:56 AM    AMC 0.50 12/21/2023 04:56 AM    EOSA 2 12/21/2023 04:56 AM    AEC 0.10 12/21/2023 04:56 AM    BASA 1 12/21/2023 04:56 AM    ABC 0.00 12/21/2023 04:56 AM        Assessment:  1. Rheumatoid arthritis involving multiple sites, unspecified whether rheumatoid factor present (CMS-HCC)        2. Immunosuppressed status        3. Rheumatoid arthritis involving multiple sites with positive rheumatoid factor (CMS-HCC)        4. Positive ANA (antinuclear antibody)        5. Raynaud's phenomenon without gangrene        6. Primary osteoarthritis involving multiple joints        7. History of colonic diverticulitis        8. High risk medication use  Hailey Rodgers Rodgers presents to clinic today for follow-up of her seropositive (rheumatoid factor >1200 and CCP antibody >300), nonerosive rheumatoid arthritis.      Patient is currently on maximized RA treatment with rituximab  1 g day 0 and 14 every 4 months and methotrexate  25 mg subcutaneous.  She has also remained on prednisone  7.5 mg since last being seen by me in December.  Previously there was a high suspicion for inflammatory arthritis given swelling and morning stiffness and involvement of the MCPs and MTPs.  She also has some symptomatology with mechanical arthritis and fibromyalgia.  There is likely a component of both etiologies that are contributing.  On previous examinations she appeared to be hypervolemic which made assessing for synovitis difficult.  There was also discussion whether this skin tightening from diffuse edema could be contributing to decrease in range of motion as well as arthralgias.  From one aspect, she has failed multiple previous medications, and has not gotten any perceived benefit from the rituximab  at any dosage which may be indication there is not an active inflammatory component.     Will plan obtain an MRI to assess for underlying synovitis and active inflammatory disease.  Given that today's exam was without synovitis, this appears to be improvement from previous exams.  Because of this we will plan to continue her current regimen of rituximab  1 g day 0 and 15 every 4 months and methotrexate  25 mg subcutaneous.  Given she will remain on rituximab  and methotrexate  we will plan to taper her prednisone  and evaluate for changes in her clinical status.  If the next visit she appears to regress we will have to consider changing therapy from rituximab .  She has current heart failure and has failed previous anti-TNFs.  She has a history of diverticulitis, so JAK inhibitors and anti-IL6  would carry an increased risk of bowel perforation.      Plan:  Obtain labs with her infusion on Monday  DMARD interval monitoring every 3 months  Continue methotrexate  25 mg subcutaneous weekly  Continue rituximab  infusions 1 g day 1 and 15 every 4 months  Discussed importance of wearing CPAP at night which may benefit overall fatigue  Taper prednisone  at 5 mg x 2-week followed by 2.5 mg for 2 weeks and then stop  7.   MRI of the right wrist and hand to evaluate for synovitis  8.   Continue medication optimization with her other specialists    Orders Placed This Encounter    predniSONE  (DELTASONE ) 5 mg tablet      Follow up in 3 months    Crisoforo Dolores, DO  Rheumatology Fellow, PGY-4    Patient was seen and plan discussed with Dr. Dannielle Dux

## 2024-04-07 NOTE — Progress Notes
 ATTESTATION    I personally performed the key portions of the E/M visit, discussed case with the fellow Dr. Cornel Diesel and concur with fellow documentation of history, physical exam, assessment, and treatment plan unless otherwise noted.    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  Additional comorbidities include heart failure with preserved ejection fraction, OSA on BiPAP, diabetes.  During her last 2 visits to rheumatology she had widespread edema including pitting edema in bilateral legs and hands thought to be possibly from her heart failure versus synovitis. She was recently hospitalized due to compression fractures in her lumbar spine requiring intervention.  Today she comes in with minimal pitting edema in the lower extremities, no edema in the hands or upper extremities.  She has tenderness in a few joints of the hands but no signs of clear synovitis.  Last rituximab  infusion was 11/28/2023 and 12/12/2023.  On today's exam, lower suspicion for active disease.  Therefore would plan to continue rituximab  every 4 months with slow prednisone  taper and low threshold to add an oral DMARD such as hydroxychloroquine or sulfasalazine at next visit if she has active synovitis or edema.  If she has significant synovitis at next visit would consider switching rituximab  to alternative therapy.  Note patient has history of diverticulitis.  Could consider alternative TNF inhibitor pending status of congestive heart failure.    Staff name:  Florian Hurt, DO Date: 04/07/2024

## 2024-04-09 ENCOUNTER — Encounter: Admit: 2024-04-09 | Discharge: 2024-04-09 | Payer: MEDICARE

## 2024-04-09 MED FILL — OXYCODONE 5 MG PO TAB: 5 mg | ORAL | 30 days supply | Status: CN

## 2024-04-09 NOTE — Telephone Encounter
 Called patients daughter Thersia Flax and got VM.

## 2024-04-09 NOTE — Telephone Encounter
 Hailey Rodgers called back, returned call but got VM. LVM.

## 2024-04-12 ENCOUNTER — Encounter: Admit: 2024-04-12 | Discharge: 2024-04-12 | Payer: MEDICARE

## 2024-04-14 ENCOUNTER — Encounter: Admit: 2024-04-14 | Discharge: 2024-04-14 | Payer: MEDICARE

## 2024-04-15 ENCOUNTER — Encounter: Admit: 2024-04-15 | Discharge: 2024-04-15 | Payer: MEDICARE

## 2024-04-27 ENCOUNTER — Encounter: Admit: 2024-04-27 | Discharge: 2024-04-27 | Payer: MEDICARE

## 2024-05-04 ENCOUNTER — Encounter: Admit: 2024-05-04 | Discharge: 2024-05-04 | Payer: MEDICARE

## 2024-05-05 ENCOUNTER — Encounter: Admit: 2024-05-05 | Discharge: 2024-05-05 | Payer: MEDICARE

## 2024-05-16 ENCOUNTER — Encounter: Admit: 2024-05-16 | Discharge: 2024-05-16 | Payer: MEDICARE

## 2024-05-16 MED FILL — METHOCARBAMOL 750 MG PO TAB: 750 mg | ORAL | 10 days supply | Qty: 30 | Fill #1 | Status: CP

## 2024-05-16 MED FILL — PREDNISONE 10 MG PO TAB: 10 mg | ORAL | 16 days supply | Qty: 40 | Fill #1 | Status: CP

## 2024-05-16 MED FILL — TAMSULOSIN 0.4 MG PO CAP: 0.4 mg | ORAL | 15 days supply | Qty: 30 | Fill #1 | Status: CP

## 2024-05-16 MED FILL — OXYCODONE 10 MG PO TAB: 10 mg | ORAL | 4 days supply | Qty: 15 | Fill #1 | Status: CP

## 2024-05-16 MED FILL — MIRTAZAPINE 7.5 MG PO TAB: 7.5 mg | ORAL | 30 days supply | Qty: 30 | Fill #1 | Status: CP

## 2024-05-17 ENCOUNTER — Encounter: Admit: 2024-05-17 | Discharge: 2024-05-17 | Payer: MEDICARE

## 2024-05-18 MED FILL — GEMTESA 75 MG PO TAB: 75 mg | ORAL | 30 days supply | Qty: 30 | Fill #1 | Status: AC

## 2024-05-24 ENCOUNTER — Encounter: Admit: 2024-05-24 | Discharge: 2024-05-24 | Payer: MEDICARE

## 2024-05-25 ENCOUNTER — Encounter: Admit: 2024-05-25 | Discharge: 2024-05-25 | Payer: MEDICARE

## 2024-05-27 ENCOUNTER — Encounter: Admit: 2024-05-27 | Discharge: 2024-05-27 | Payer: MEDICARE

## 2024-05-27 MED FILL — MUPIROCIN 2 % TP OINT: 2 % | TOPICAL | 30 days supply | Qty: 22 | Fill #7 | Status: AC

## 2024-05-31 ENCOUNTER — Encounter: Admit: 2024-05-31 | Discharge: 2024-05-31 | Payer: MEDICARE

## 2024-05-31 MED FILL — INSULIN ASPART U-100 100 UNIT/ML SC SOLN: 100 unit/mL | SUBCUTANEOUS | 11 days supply | Qty: 10 | Fill #1 | Status: AC

## 2024-05-31 MED FILL — METHOTREXATE SODIUM 2.5 MG PO TAB: 2.5 mg | ORAL | 7 days supply | Qty: 10 | Fill #1 | Status: AC

## 2024-05-31 MED FILL — INSULIN GLARGINE 100 UNIT/ML SC SOLN: 100 unit/mL | SUBCUTANEOUS | 22 days supply | Qty: 10 | Fill #1 | Status: AC

## 2024-06-01 ENCOUNTER — Encounter: Admit: 2024-06-01 | Discharge: 2024-06-01 | Payer: MEDICARE

## 2024-06-01 MED FILL — TAMSULOSIN 0.4 MG PO CAP: 0.4 mg | ORAL | 5 days supply | Qty: 10 | Fill #1 | Status: CP

## 2024-06-01 MED FILL — LEVOTHYROXINE 88 MCG PO TAB: 88 mcg | ORAL | 5 days supply | Qty: 5 | Fill #1 | Status: CP

## 2024-06-01 MED FILL — FLUTICASONE PROPIONATE 50 MCG/ACTUATION NA SPSN: 50 mcg/actuation | NASAL | 30 days supply | Qty: 16 | Fill #1 | Status: CP

## 2024-06-01 MED FILL — NYSTATIN 100,000 UNIT/GRAM TP CREA: 100000 unit/g | TOPICAL | 5 days supply | Qty: 15 | Fill #1 | Status: CP

## 2024-06-01 MED FILL — ALBUTEROL SULFATE 90 MCG/ACTUATION IN HFAA: 90 mcg/actuation | RESPIRATORY_TRACT | 25 days supply | Qty: 6.7 | Fill #1 | Status: CP

## 2024-06-01 MED FILL — VENLAFAXINE 75 MG PO CP24: 75 mg | ORAL | 5 days supply | Qty: 15 | Fill #1 | Status: CP

## 2024-06-01 MED FILL — ROSUVASTATIN 20 MG PO TAB: 20 mg | ORAL | 5 days supply | Qty: 5 | Fill #1 | Status: CP

## 2024-06-01 MED FILL — MIRTAZAPINE 15 MG PO TAB: 15 mg | ORAL | 6 days supply | Qty: 3 | Fill #1 | Status: CP

## 2024-06-01 MED FILL — PREGABALIN 100 MG PO CAP: 100 mg | ORAL | 5 days supply | Qty: 15 | Fill #1 | Status: CP

## 2024-06-01 MED FILL — PRAMIPEXOLE 0.25 MG PO TAB: 0.25 mg | ORAL | 5 days supply | Qty: 30 | Fill #1 | Status: CP

## 2024-06-01 MED FILL — PREDNISONE 10 MG PO TAB: 10 mg | ORAL | 5 days supply | Qty: 5 | Fill #1 | Status: CP

## 2024-06-01 MED FILL — OXYCODONE 5 MG PO TAB: 5 mg | ORAL | 2 days supply | Qty: 5 | Fill #1 | Status: CP

## 2024-06-01 MED FILL — METHOCARBAMOL 750 MG PO TAB: 750 mg | ORAL | 5 days supply | Qty: 15 | Fill #1 | Status: CP

## 2024-06-01 MED FILL — FOLIC ACID 1 MG PO TAB: 1 mg | ORAL | 5 days supply | Qty: 5 | Fill #1 | Status: CP

## 2024-06-02 ENCOUNTER — Encounter: Admit: 2024-06-02 | Discharge: 2024-06-02 | Payer: MEDICARE

## 2024-06-02 MED FILL — INSULIN GLARGINE 100 UNIT/ML (3 ML) SC INJ PEN: 100 unit/mL (3 mL) | SUBCUTANEOUS | 33 days supply | Qty: 15 | Fill #1 | Status: AC

## 2024-06-02 MED FILL — INSULIN ASPART 100 UNIT/ML SC FLEXPEN: 100 unit/mL (3 mL) | SUBCUTANEOUS | 17 days supply | Qty: 15 | Fill #1 | Status: AC

## 2024-06-05 ENCOUNTER — Encounter: Admit: 2024-06-05 | Discharge: 2024-06-05 | Payer: MEDICARE

## 2024-06-06 ENCOUNTER — Encounter: Admit: 2024-06-06 | Discharge: 2024-06-06 | Payer: MEDICARE

## 2024-06-06 MED FILL — GEMTESA 75 MG PO TAB: 75 mg | ORAL | 5 days supply | Qty: 5 | Fill #1 | Status: AC

## 2024-06-07 ENCOUNTER — Encounter: Admit: 2024-06-07 | Discharge: 2024-06-07 | Payer: MEDICARE

## 2024-06-08 ENCOUNTER — Encounter: Admit: 2024-06-08 | Discharge: 2024-06-08 | Payer: MEDICARE

## 2024-06-09 ENCOUNTER — Encounter: Admit: 2024-06-09 | Discharge: 2024-06-09 | Payer: MEDICARE

## 2024-06-09 MED FILL — FAMOTIDINE 40 MG PO TAB: 40 mg | ORAL | 5 days supply | Qty: 5 | Fill #1 | Status: AC

## 2024-06-12 ENCOUNTER — Encounter: Admit: 2024-06-12 | Discharge: 2024-06-12 | Payer: MEDICARE

## 2024-06-16 ENCOUNTER — Encounter: Admit: 2024-06-16 | Discharge: 2024-06-16 | Payer: MEDICARE

## 2024-06-17 ENCOUNTER — Encounter: Admit: 2024-06-17 | Discharge: 2024-06-17 | Payer: MEDICARE

## 2024-06-17 MED FILL — INSULIN ASPART 100 UNIT/ML SC FLEXPEN: 100 unit/mL (3 mL) | SUBCUTANEOUS | 17 days supply | Qty: 15 | Fill #0 | Status: AC

## 2024-06-17 MED FILL — INSULIN GLARGINE 100 UNIT/ML (3 ML) SC INJ PEN: 100 unit/mL (3 mL) | SUBCUTANEOUS | 33 days supply | Qty: 15 | Fill #0 | Status: AC

## 2024-06-18 ENCOUNTER — Encounter: Admit: 2024-06-18 | Discharge: 2024-06-18 | Payer: MEDICARE

## 2024-06-20 ENCOUNTER — Encounter: Admit: 2024-06-20 | Discharge: 2024-06-20 | Payer: MEDICARE

## 2024-06-21 ENCOUNTER — Encounter: Admit: 2024-06-21 | Discharge: 2024-06-21 | Payer: MEDICARE

## 2024-06-23 ENCOUNTER — Encounter: Admit: 2024-06-23 | Discharge: 2024-06-23 | Payer: MEDICARE

## 2024-06-23 ENCOUNTER — Emergency Department: Admit: 2024-06-23 | Discharge: 2024-06-23 | Payer: MEDICARE

## 2024-06-23 DIAGNOSIS — M549 Dorsalgia, unspecified: Secondary | ICD-10-CM

## 2024-06-23 MED ORDER — DICLOFENAC SODIUM 1 % TP GEL
2 g | Freq: Once | TOPICAL | 0 refills | Status: CP
Start: 2024-06-23 — End: ?
  Administered 2024-06-24: 04:00:00 2 g via TOPICAL

## 2024-06-23 MED ORDER — FENTANYL CITRATE (PF) 50 MCG/ML IJ SOLN
25 ug | Freq: Once | INTRAVENOUS | 0 refills | Status: DC
Start: 2024-06-23 — End: 2024-06-24

## 2024-06-23 MED ORDER — METHOCARBAMOL 100 MG/ML IJ SOLN
500 mg | Freq: Once | INTRAVENOUS | 0 refills | Status: CP
Start: 2024-06-23 — End: ?
  Administered 2024-06-24: 04:00:00 500 mg via INTRAVENOUS

## 2024-06-23 MED ORDER — ACETAMINOPHEN 1,000 MG/100 ML (10 MG/ML) IV SOLN
1000 mg | Freq: Once | INTRAVENOUS | 0 refills | Status: CP
Start: 2024-06-23 — End: ?
  Administered 2024-06-24: 04:00:00 1000 mg via INTRAVENOUS

## 2024-06-23 NOTE — ED Notes
 Pt is a 73 y/o F who presents to the ED via POV w/ CC of back pain along with NV and diarrhea for 3 days. Pt endorses that the stools and urination that she has been having has been foul smelling. Pt denies any burning or blood with urination and no blood seen in the stool. Pt endorses having an increase in lower back pain. Pt had back surgery in March 2025. The patients daughter stated that the patient has had an increase in generalized weakness in the last couple of weeks, she also endorses that the patient has been febrile with a Tmax of 101. The patients daughter stated that she had given the patient tylenol  shortly before arriving to the ED.    Pt denies cough, runny nose, and sore throat.    VSS. PT A&Ox4, resting in bed. Respirations even and non-labored. Skin is warm, dry, intact, and appropriate for ethnicity. Pt hooked up to monitors. Bed is in lowest locked position, call light within reach.      Past Medical History:    Age-related physical debility    Allergic rhinitis    Arthritis    Asthma    Bronchitis    Cancer (CMS-HCC)    Cervical stenosis of spinal canal    Convulsion (CMS-HCC)    Coronary artery disease    COVID-19 virus infection    Disorganized thinking    Diverticulosis    DM (diabetes mellitus) (CMS-HCC)    Dyslipidemia    Dyspnea    Fatty liver disease, nonalcoholic    Gastroesophageal reflux disease without esophagitis    GERD (gastroesophageal reflux disease)    Hypertriglyceridemia    Hypothyroidism    Hypothyroidism due to acquired atrophy of thyroid    Infection    Leg weakness    Lumbar stenosis    Lung disease    Lung nodule    Memory loss    Movement disorder    Narcolepsy    Obesity    On supplemental oxygen therapy    OSA (obstructive sleep apnea)    Other dysphagia    Other hyperlipidemia    Peripheral neuropathy    Polypharmacy    Postmenopausal    Pseudoseizures    Recurrent major depressive disorder, in partial remission    Rhabdomyolysis    Rheumatoid arthritis (CMS-HCC) Seizures (CMS-HCC)    Sleep disorder    Syncope and collapse    Type 2 diabetes mellitus with hyperglycemia, with long-term current use of insulin  (CMS-HCC)    Unspecified deficiency anemia    Urinary incontinence    Urinary problem    Vision decreased

## 2024-06-23 NOTE — ED Triage Notes
 Patient presents to the ED endorsing increase in lower back pain, with diarrhea x 3 days with fever. Patient had back surgery in March 2025. Patient also endorsing foul urine and stool odor. Family reports generalized weakness worse over last couple of weeks with difficulty ambulating. Patient Eupneic. A/Ox4.     Past Medical History:    Age-related physical debility    Allergic rhinitis    Arthritis    Asthma    Bronchitis    Cancer (CMS-HCC)    Cervical stenosis of spinal canal    Convulsion (CMS-HCC)    Coronary artery disease    COVID-19 virus infection    Disorganized thinking    Diverticulosis    DM (diabetes mellitus) (CMS-HCC)    Dyslipidemia    Dyspnea    Fatty liver disease, nonalcoholic    Gastroesophageal reflux disease without esophagitis    GERD (gastroesophageal reflux disease)    Hypertriglyceridemia    Hypothyroidism    Hypothyroidism due to acquired atrophy of thyroid    Infection    Leg weakness    Lumbar stenosis    Lung disease    Lung nodule    Memory loss    Movement disorder    Narcolepsy    Obesity    On supplemental oxygen therapy    OSA (obstructive sleep apnea)    Other dysphagia    Other hyperlipidemia    Peripheral neuropathy    Polypharmacy    Postmenopausal    Pseudoseizures    Recurrent major depressive disorder, in partial remission    Rhabdomyolysis    Rheumatoid arthritis (CMS-HCC)    Seizures (CMS-HCC)    Sleep disorder    Syncope and collapse    Type 2 diabetes mellitus with hyperglycemia, with long-term current use of insulin  (CMS-HCC)    Unspecified deficiency anemia    Urinary incontinence    Urinary problem    Vision decreased     Patient placed on tele, BP, and sp02 monitoring. Bed in lowest, locked position. Call light in reach.     Patient and family assumes responsibility of all personal belongings.

## 2024-06-24 ENCOUNTER — Inpatient Hospital Stay: Admit: 2024-06-24 | Discharge: 2024-06-24 | Payer: MEDICARE

## 2024-06-24 ENCOUNTER — Encounter: Admit: 2024-06-24 | Discharge: 2024-06-24 | Payer: MEDICARE

## 2024-06-24 LAB — POC GLUCOSE
~~LOC~~ BKR POC GLUCOSE: 127 mg/dL — ABNORMAL HIGH (ref 70–100)
~~LOC~~ BKR POC GLUCOSE: 282 mg/dL — ABNORMAL HIGH (ref 70–100)
~~LOC~~ BKR POC GLUCOSE: 109 mg/dL — ABNORMAL HIGH (ref 70–100)

## 2024-06-24 LAB — C REACTIVE PROTEIN (CRP): ~~LOC~~ BKR C-REACTIVE PROTEIN: 3.4 mg/dL — ABNORMAL HIGH (ref <1.00)

## 2024-06-24 LAB — CBC AND DIFF
~~LOC~~ BKR ABSOLUTE BASO COUNT: 0 10*3/uL (ref 0.00–0.20)
~~LOC~~ BKR ABSOLUTE EOS COUNT: 0 10*3/uL — AB (ref 0.00–0.45)
~~LOC~~ BKR ABSOLUTE LYMPH COUNT: 1 10*3/uL — AB (ref 1.00–4.80)
~~LOC~~ BKR ABSOLUTE MONO COUNT: 0.6 10*3/uL — AB (ref 0.00–0.80)
~~LOC~~ BKR ABSOLUTE NEUTROPHIL: 6.9 10*3/uL (ref 1.80–7.00)
~~LOC~~ BKR LYMPHOCYTES %: 11 % — ABNORMAL LOW (ref 24.0–44.0)
~~LOC~~ BKR MCHC: 32 g/dL (ref 32.0–36.0)
~~LOC~~ BKR MCV: 80 fL — ABNORMAL HIGH (ref 80.0–100.0)
~~LOC~~ BKR MDW (MONOCYTE DISTRIBUTION WIDTH): 24 — ABNORMAL HIGH (ref <=20.6)
~~LOC~~ BKR MPV: 8.2 fL — AB (ref 7.0–11.0)
~~LOC~~ BKR NEUTROPHILS %: 80 % — ABNORMAL HIGH (ref 41.0–77.0)
~~LOC~~ BKR PLATELET COUNT: 258 10*3/uL — AB (ref 150–400)
~~LOC~~ BKR RBC COUNT: 5 10*6/uL — ABNORMAL HIGH (ref 4.00–5.00)
~~LOC~~ BKR RDW: 17 % — ABNORMAL HIGH (ref 11.0–15.0)
~~LOC~~ BKR WBC COUNT: 8.6 10*3/uL (ref 4.50–11.00)

## 2024-06-24 LAB — COMPREHENSIVE METABOLIC PANEL
~~LOC~~ BKR ALT: 23 U/L — AB (ref 7–56)
~~LOC~~ BKR BLD UREA NITROGEN: 20 mg/dL (ref 7–25)
~~LOC~~ BKR CHLORIDE: 100 mmol/L (ref 98–110)
~~LOC~~ BKR CO2: 24 mmol/L (ref 21–30)
~~LOC~~ BKR POTASSIUM: 3.6 mmol/L (ref 3.5–5.1)

## 2024-06-24 LAB — NT-PRO-BNP: ~~LOC~~ BKR NT-PRO-BNP: <36 pg/mL (ref <125)

## 2024-06-24 LAB — CRYPTOSPORIDIUM, FECAL: ~~LOC~~ BKR CRYPTO EIA: NEGATIVE

## 2024-06-24 LAB — POC LACTATE: ~~LOC~~ BKR POC LACTIC ACID: 0.7 mmol/L (ref 0.5–2.0)

## 2024-06-24 LAB — GIARDIA SCREEN,FECAL: ~~LOC~~ BKR GIARDIA EIA: NEGATIVE

## 2024-06-24 LAB — URINALYSIS DIPSTICK REFLEX TO CULTURE: ~~LOC~~ BKR GLUCOSE,UA: NEGATIVE U/L (ref 7–40)

## 2024-06-24 LAB — HEMOGLOBIN A1C: ~~LOC~~ BKR HEMOGLOBIN A1C: 9.1 % — ABNORMAL HIGH (ref 4.0–5.7)

## 2024-06-24 LAB — TSH WITH FREE T4 REFLEX: ~~LOC~~ BKR TSH: 1.4 [IU]/mL (ref 0.35–5.00)

## 2024-06-24 LAB — C DIFFICILE BY PCR: ~~LOC~~ BKR C DIFF TOXIN B: NEGATIVE

## 2024-06-24 LAB — SED RATE: ~~LOC~~ BKR ESR: 40 mm/h — ABNORMAL HIGH (ref <=30)

## 2024-06-24 MED ORDER — FERROUS SULFATE 325 MG (65 MG IRON) PO TAB
325 mg | Freq: Two times a day (BID) | ORAL | 0 refills | Status: DC
Start: 2024-06-24 — End: 2024-06-28
  Administered 2024-06-24 – 2024-06-28 (×9): 325 mg via ORAL

## 2024-06-24 MED ORDER — LEVOTHYROXINE 88 MCG PO TAB
88 ug | Freq: Every day | ORAL | 0 refills | Status: DC
Start: 2024-06-24 — End: 2024-07-01
  Administered 2024-06-24 – 2024-07-01 (×8): 88 ug via ORAL

## 2024-06-24 MED ORDER — ACETAMINOPHEN 325 MG PO TAB
650 mg | ORAL | 0 refills | Status: DC | PRN
Start: 2024-06-24 — End: 2024-07-01
  Administered 2024-06-25 – 2024-07-01 (×16): 650 mg via ORAL

## 2024-06-24 MED ORDER — OXYCODONE 10 MG PO TAB
10 mg | ORAL | 0 refills | Status: CN | PRN
Start: 2024-06-24 — End: ?

## 2024-06-24 MED ORDER — SENNOSIDES-DOCUSATE SODIUM 8.6-50 MG PO TAB
1 | Freq: Every day | ORAL | 0 refills | Status: CN | PRN
Start: 2024-06-24 — End: ?

## 2024-06-24 MED ORDER — DICLOFENAC SODIUM 1 % TP GEL
4 g | Freq: Four times a day (QID) | TOPICAL | 0 refills | Status: DC | PRN
Start: 2024-06-24 — End: 2024-07-01
  Administered 2024-06-25: 16:00:00 4 g via TOPICAL

## 2024-06-24 MED ORDER — INSULIN GLARGINE 100 UNIT/ML (3 ML) SC INJ PEN
20 [IU] | Freq: Two times a day (BID) | SUBCUTANEOUS | 0 refills | Status: CN
Start: 2024-06-24 — End: ?

## 2024-06-24 MED ORDER — BISACODYL 10 MG RE SUPP
10 mg | Freq: Every day | RECTAL | 0 refills | Status: DC | PRN
Start: 2024-06-24 — End: 2024-07-01

## 2024-06-24 MED ORDER — ASPIRIN 81 MG PO CHEW
81 mg | Freq: Every day | ORAL | 0 refills | Status: CN
Start: 2024-06-24 — End: ?

## 2024-06-24 MED ORDER — SODIUM CHLORIDE 0.9 % IJ SOLN
50 mL | Freq: Once | INTRAVENOUS | 0 refills | Status: CP
Start: 2024-06-24 — End: ?
  Administered 2024-06-24: 07:00:00 50 mL via INTRAVENOUS

## 2024-06-24 MED ORDER — DEXTROSE 50 % IN WATER (D50W) IV SYRG
12.5-25 g | INTRAVENOUS | 0 refills | Status: DC | PRN
Start: 2024-06-24 — End: 2024-07-01
  Administered 2024-06-26: 25 mL via INTRAVENOUS

## 2024-06-24 MED ORDER — VANCOMYCIN 1,250 MG IVPB (VIAL/BAG ADAPTER)
15 mg/kg | Freq: Two times a day (BID) | INTRAVENOUS | 0 refills | Status: DC
Start: 2024-06-24 — End: 2024-06-24

## 2024-06-24 MED ORDER — CEFTRIAXONE INJ 1GM IVP
1 g | INTRAVENOUS | 0 refills | Status: DC
Start: 2024-06-24 — End: 2024-06-28
  Administered 2024-06-25 – 2024-06-28 (×4): 1 g via INTRAVENOUS

## 2024-06-24 MED ORDER — METHOCARBAMOL 500 MG PO TAB
500 mg | Freq: Two times a day (BID) | ORAL | 0 refills | Status: DC | PRN
Start: 2024-06-24 — End: 2024-06-28
  Administered 2024-06-25 – 2024-06-28 (×3): 500 mg via ORAL

## 2024-06-24 MED ORDER — FAMOTIDINE 20 MG PO TAB
40 mg | Freq: Every evening | ORAL | 0 refills | Status: DC
Start: 2024-06-24 — End: 2024-07-01
  Administered 2024-06-25 – 2024-07-01 (×7): 40 mg via ORAL

## 2024-06-24 MED ORDER — PRAMIPEXOLE 0.25 MG PO TAB
.75 mg | Freq: Once | ORAL | 0 refills | Status: CP
Start: 2024-06-24 — End: ?
  Administered 2024-06-24: 06:00:00 0.75 mg via ORAL

## 2024-06-24 MED ORDER — PREGABALIN 25 MG PO CAP
25 mg | ORAL | 0 refills | Status: DC
Start: 2024-06-24 — End: 2024-06-28
  Administered 2024-06-24 – 2024-06-28 (×12): 25 mg via ORAL

## 2024-06-24 MED ORDER — DICLOFENAC SODIUM 1 % TP GEL
4 g | Freq: Three times a day (TID) | TOPICAL | 0 refills | Status: CN
Start: 2024-06-24 — End: ?

## 2024-06-24 MED ORDER — ONDANSETRON HCL (PF) 4 MG/2 ML IJ SOLN
4 mg | INTRAVENOUS | 0 refills | Status: CN | PRN
Start: 2024-06-24 — End: ?

## 2024-06-24 MED ORDER — OXYCODONE 5 MG PO TAB
5 mg | ORAL | 0 refills | Status: DC | PRN
Start: 2024-06-24 — End: 2024-06-28
  Administered 2024-06-25 – 2024-06-27 (×4): 5 mg via ORAL

## 2024-06-24 MED ORDER — DIAZEPAM 5 MG/ML IJ SYRG
2.5 mg | Freq: Once | INTRAVENOUS | 0 refills | Status: CP
Start: 2024-06-24 — End: ?
  Administered 2024-06-24: 09:00:00 2.5 mg via INTRAVENOUS

## 2024-06-24 MED ORDER — MIRTAZAPINE 7.5 MG PO TAB
7.5 mg | Freq: Every day | ORAL | 0 refills | Status: DC
Start: 2024-06-24 — End: 2024-06-26
  Administered 2024-06-24 – 2024-06-26 (×3): 7.5 mg via ORAL

## 2024-06-24 MED ORDER — INSULIN GLARGINE 100 UNIT/ML (3 ML) SC INJ PEN
50 [IU] | Freq: Every evening | SUBCUTANEOUS | 0 refills | Status: CN
Start: 2024-06-24 — End: ?

## 2024-06-24 MED ORDER — INSULIN GLARGINE 100 UNIT/ML (3 ML) SC INJ PEN
40 [IU] | Freq: Every evening | SUBCUTANEOUS | 0 refills | Status: DC
Start: 2024-06-24 — End: 2024-06-24

## 2024-06-24 MED ORDER — PRAMIPEXOLE 0.25 MG PO TAB
.75 mg | Freq: Every evening | ORAL | 0 refills | Status: DC
Start: 2024-06-24 — End: 2024-07-01
  Administered 2024-06-25 – 2024-07-01 (×7): 0.75 mg via ORAL

## 2024-06-24 MED ORDER — INSULIN ASPART 100 UNIT/ML SC FLEXPEN
30 [IU] | Freq: Three times a day (TID) | SUBCUTANEOUS | 0 refills | Status: CN
Start: 2024-06-24 — End: ?

## 2024-06-24 MED ORDER — ACETAMINOPHEN 500 MG PO TAB
500 mg | ORAL | 0 refills | Status: CN | PRN
Start: 2024-06-24 — End: ?

## 2024-06-24 MED ORDER — LIDOCAINE 5 % TP PTMD
1 | Freq: Every day | TOPICAL | 0 refills | Status: CN
Start: 2024-06-24 — End: ?

## 2024-06-24 MED ORDER — NITROGLYCERIN 0.4 MG SL SUBL
.4 mg | SUBLINGUAL | 0 refills | Status: DC | PRN
Start: 2024-06-24 — End: 2024-07-01

## 2024-06-24 MED ORDER — MELATONIN 5 MG PO TAB
5 mg | Freq: Every evening | ORAL | 0 refills | Status: DC | PRN
Start: 2024-06-24 — End: 2024-07-01
  Administered 2024-06-29 – 2024-07-01 (×3): 5 mg via ORAL

## 2024-06-24 MED ORDER — INSULIN ASPART 100 UNIT/ML SC FLEXPEN
0-12 [IU] | Freq: Every day | SUBCUTANEOUS | 0 refills | Status: DC
Start: 2024-06-24 — End: 2024-06-24

## 2024-06-24 MED ORDER — LOPERAMIDE 2 MG PO CAP
2 mg | ORAL | 0 refills | Status: DC | PRN
Start: 2024-06-24 — End: 2024-07-01

## 2024-06-24 MED ORDER — LIDOCAINE 5 % TP PTMD
1 | Freq: Every day | TOPICAL | 0 refills | Status: DC
Start: 2024-06-24 — End: 2024-07-01
  Administered 2024-06-24 – 2024-06-30 (×4): 1 via TOPICAL

## 2024-06-24 MED ORDER — ASPIRIN 81 MG PO CHEW
81 mg | Freq: Every evening | ORAL | 0 refills | Status: DC
Start: 2024-06-24 — End: 2024-07-01
  Administered 2024-06-25 – 2024-07-01 (×7): 81 mg via ORAL

## 2024-06-24 MED ORDER — CHOLECALCIFEROL (VITAMIN D3) 125 MCG (5,000 UNIT) PO TAB
5000 [IU] | ORAL | 0 refills | Status: DC
Start: 2024-06-24 — End: 2024-07-01
  Administered 2024-06-27: 14:00:00 5000 [IU] via ORAL

## 2024-06-24 MED ORDER — INSULIN ASPART 100 UNIT/ML SC FLEXPEN
30 [IU] | Freq: Three times a day (TID) | SUBCUTANEOUS | 0 refills | Status: DC
Start: 2024-06-24 — End: 2024-06-25

## 2024-06-24 MED ORDER — ONDANSETRON 4 MG PO TBDI
4 mg | ORAL | 0 refills | Status: CN | PRN
Start: 2024-06-24 — End: ?

## 2024-06-24 MED ORDER — INSULIN ASPART 100 UNIT/ML SC FLEXPEN
0-12 [IU] | Freq: Before meals | SUBCUTANEOUS | 0 refills | Status: DC
Start: 2024-06-24 — End: 2024-07-01
  Administered 2024-06-24: 6 [IU] via SUBCUTANEOUS
  Administered 2024-06-30: 18:00:00 2 [IU] via SUBCUTANEOUS

## 2024-06-24 MED ORDER — VANCOMYCIN PHARMACY TO MANAGE
1 | 0 refills | Status: DC
Start: 2024-06-24 — End: 2024-06-24

## 2024-06-24 MED ORDER — PRAMIPEXOLE 0.25 MG PO TAB
.25 mg | Freq: Once | ORAL | 0 refills | Status: DC
Start: 2024-06-24 — End: 2024-06-24

## 2024-06-24 MED ORDER — MAGNESIUM SULFATE IN D5W 1 GRAM/100 ML IV PGBK
1 g | INTRAVENOUS | 0 refills | Status: CP
Start: 2024-06-24 — End: ?
  Administered 2024-06-24: 16:00:00 1 g via INTRAVENOUS

## 2024-06-24 MED ORDER — SENNOSIDES-DOCUSATE SODIUM 8.6-50 MG PO TAB
1 | Freq: Every day | ORAL | 0 refills | Status: CN
Start: 2024-06-24 — End: ?

## 2024-06-24 MED ORDER — DOCUSATE SODIUM 100 MG PO CAP
200 mg | Freq: Two times a day (BID) | ORAL | 0 refills | Status: CN
Start: 2024-06-24 — End: ?

## 2024-06-24 MED ORDER — LORATADINE 10 MG PO TAB
10 mg | Freq: Every day | ORAL | 0 refills | Status: CN
Start: 2024-06-24 — End: ?

## 2024-06-24 MED ORDER — CELECOXIB 200 MG PO CAP
200 mg | Freq: Every day | ORAL | 0 refills | Status: DC
Start: 2024-06-24 — End: 2024-07-01
  Administered 2024-06-24 – 2024-07-01 (×8): 200 mg via ORAL

## 2024-06-24 MED ORDER — IMS MIXTURE TEMPLATE
225 mg | Freq: Every day | ORAL | 0 refills | Status: DC
Start: 2024-06-24 — End: 2024-07-01
  Administered 2024-06-24 – 2024-07-01 (×16): 225 mg via ORAL

## 2024-06-24 MED ORDER — CEFTRIAXONE INJ 2GM IVP
2 g | Freq: Once | INTRAVENOUS | 0 refills | Status: CP
Start: 2024-06-24 — End: ?
  Administered 2024-06-24: 09:00:00 2 g via INTRAVENOUS

## 2024-06-24 MED ORDER — POTASSIUM CHLORIDE 20 MEQ PO TBTQ
40 meq | Freq: Once | ORAL | 0 refills | Status: CP
Start: 2024-06-24 — End: ?
  Administered 2024-06-24: 12:00:00 40 meq via ORAL

## 2024-06-24 MED ORDER — CEFEPIME 2G/100ML NS IVPB (MB+)
2 g | INTRAVENOUS | 0 refills | Status: DC
Start: 2024-06-24 — End: 2024-06-24
  Administered 2024-06-24 (×2): 2 g via INTRAVENOUS

## 2024-06-24 MED ORDER — ROSUVASTATIN 5 MG PO TAB
20 mg | Freq: Every day | ORAL | 0 refills | Status: DC
Start: 2024-06-24 — End: 2024-07-01
  Administered 2024-06-24 – 2024-07-01 (×8): 20 mg via ORAL

## 2024-06-24 MED ORDER — IOHEXOL 350 MG IODINE/ML IV SOLN
100 mL | Freq: Once | INTRAVENOUS | 0 refills | Status: CP
Start: 2024-06-24 — End: ?
  Administered 2024-06-24: 07:00:00 100 mL via INTRAVENOUS

## 2024-06-24 MED ORDER — PREGABALIN 50 MG PO CAP
100 mg | ORAL | 0 refills | Status: DC
Start: 2024-06-24 — End: 2024-06-24
  Administered 2024-06-24: 12:00:00 100 mg via ORAL

## 2024-06-24 MED ORDER — LORATADINE 10 MG PO TAB
10 mg | Freq: Every day | ORAL | 0 refills | Status: DC
Start: 2024-06-24 — End: 2024-07-01
  Administered 2024-06-24 – 2024-07-01 (×8): 10 mg via ORAL

## 2024-06-24 MED ORDER — FOLIC ACID 1 MG PO TAB
4 mg | Freq: Every day | ORAL | 0 refills | Status: DC
Start: 2024-06-24 — End: 2024-07-01
  Administered 2024-06-24 – 2024-07-01 (×8): 4 mg via ORAL

## 2024-06-24 MED ORDER — HYDROCORTISONE ACETATE 25 MG RE SUPP
1 | Freq: Two times a day (BID) | RECTAL | 0 refills | Status: DC | PRN
Start: 2024-06-24 — End: 2024-07-01

## 2024-06-24 MED ORDER — VANCOMYCIN 1000 MG/250 ML IVPB (VIAL/BAG ADAPTER)
1000 mg | Freq: Two times a day (BID) | INTRAVENOUS | 0 refills | Status: DC
Start: 2024-06-24 — End: 2024-06-24

## 2024-06-24 MED ORDER — POLYETHYLENE GLYCOL 3350 17 GRAM PO PWPK
17 g | Freq: Every day | ORAL | 0 refills | Status: CN
Start: 2024-06-24 — End: ?

## 2024-06-24 MED ORDER — POLYETHYLENE GLYCOL 3350 17 GRAM PO PWPK
17 g | Freq: Two times a day (BID) | ORAL | 0 refills | Status: DC | PRN
Start: 2024-06-24 — End: 2024-06-26

## 2024-06-24 MED ORDER — NYSTATIN 100,000 UNIT/GRAM TP CREA
Freq: Two times a day (BID) | TOPICAL | 0 refills | Status: CN
Start: 2024-06-24 — End: ?

## 2024-06-24 MED ORDER — PANTOPRAZOLE 40 MG PO TBEC
40 mg | Freq: Every day | ORAL | 0 refills | Status: DC
Start: 2024-06-24 — End: 2024-07-01
  Administered 2024-06-24 – 2024-07-01 (×8): 40 mg via ORAL

## 2024-06-24 MED ORDER — ALBUTEROL SULFATE 90 MCG/ACTUATION IN HFAA
2 | RESPIRATORY_TRACT | 0 refills | Status: DC | PRN
Start: 2024-06-24 — End: 2024-07-01

## 2024-06-24 MED ORDER — CALCIUM CARBONATE 500 MG CALCIUM (1,250 MG) PO TAB
1200 mg | Freq: Two times a day (BID) | ORAL | 0 refills | Status: DC
Start: 2024-06-24 — End: 2024-07-01
  Administered 2024-06-24 – 2024-07-01 (×15): 1250 mg via ORAL

## 2024-06-24 MED ORDER — LEVOTHYROXINE 88 MCG PO TAB
88 ug | Freq: Every morning | ORAL | 0 refills | Status: CN
Start: 2024-06-24 — End: ?

## 2024-06-24 MED ORDER — PREDNISONE 5 MG PO TAB
5 mg | Freq: Every day | ORAL | 0 refills | Status: DC
Start: 2024-06-24 — End: 2024-07-01
  Administered 2024-06-24 – 2024-07-01 (×8): 5 mg via ORAL

## 2024-06-24 MED ORDER — MULTIVITAMIN PO TAB
1 | Freq: Every day | ORAL | 0 refills | Status: DC
Start: 2024-06-24 — End: 2024-07-01
  Administered 2024-06-24 – 2024-07-01 (×8): 1 via ORAL

## 2024-06-24 MED ORDER — VANCOMYCIN 1,500 MG IVPB (VIAL/BAG ADAPTER)
20 mg/kg | Freq: Once | INTRAVENOUS | 0 refills | Status: CP
Start: 2024-06-24 — End: ?
  Administered 2024-06-24 (×2): 1500 mg via INTRAVENOUS

## 2024-06-24 MED ORDER — GADOBENATE DIMEGLUMINE 529 MG/ML (0.1MMOL/0.2ML) IV SOLN
16 mL | Freq: Once | INTRAVENOUS | 0 refills | Status: CP
Start: 2024-06-24 — End: ?
  Administered 2024-06-24: 11:00:00 16 mL via INTRAVENOUS

## 2024-06-24 MED ORDER — FLUTICASONE PROPIONATE 50 MCG/ACTUATION NA SPSN
1 | Freq: Two times a day (BID) | NASAL | 0 refills | Status: DC | PRN
Start: 2024-06-24 — End: 2024-07-01

## 2024-06-24 MED ORDER — METHOCARBAMOL 750 MG PO TAB
750 mg | Freq: Three times a day (TID) | ORAL | 0 refills | Status: DC | PRN
Start: 2024-06-24 — End: 2024-06-24

## 2024-06-24 MED ORDER — CALCIUM CARBONATE 200 MG CALCIUM (500 MG) PO CHEW
500 mg | Freq: Two times a day (BID) | ORAL | 0 refills | Status: CN
Start: 2024-06-24 — End: ?

## 2024-06-24 MED ORDER — MAGNESIUM SULFATE IN D5W 1 GRAM/100 ML IV PGBK
1 g | INTRAVENOUS | 0 refills | Status: CP
Start: 2024-06-24 — End: ?
  Administered 2024-06-24: 12:00:00 1 g via INTRAVENOUS

## 2024-06-24 MED ORDER — INSULIN GLARGINE 100 UNIT/ML (3 ML) SC INJ PEN
40 [IU] | Freq: Two times a day (BID) | SUBCUTANEOUS | 0 refills | Status: DC
Start: 2024-06-24 — End: 2024-06-25
  Administered 2024-06-24: 15:00:00 40 [IU] via SUBCUTANEOUS

## 2024-06-24 MED ORDER — POLYETHYLENE GLYCOL 3350 17 GRAM PO PWPK
1 | Freq: Every day | ORAL | 0 refills | Status: CN | PRN
Start: 2024-06-24 — End: ?

## 2024-06-24 MED ORDER — INSULIN GLARGINE 100 UNIT/ML (3 ML) SC INJ PEN
40 [IU] | Freq: Every evening | SUBCUTANEOUS | 0 refills | Status: CN
Start: 2024-06-24 — End: ?

## 2024-06-24 MED ORDER — ENOXAPARIN 40 MG/0.4 ML SC SYRG
40 mg | Freq: Every day | SUBCUTANEOUS | 0 refills | Status: DC
Start: 2024-06-24 — End: 2024-07-01
  Administered 2024-06-25 – 2024-07-01 (×7): 40 mg via SUBCUTANEOUS

## 2024-06-24 MED ORDER — OXYCODONE 5 MG PO TAB
5 mg | ORAL | 0 refills | Status: DC | PRN
Start: 2024-06-24 — End: 2024-06-24

## 2024-06-24 MED ORDER — MULTIVIT-IRON-FA-CALCIUM-MINS 9 MG IRON-400 MCG PO TAB
1 | Freq: Every day | ORAL | 0 refills | Status: CN
Start: 2024-06-24 — End: ?

## 2024-06-24 MED ORDER — PRAMIPEXOLE 0.25 MG PO TAB
.75 mg | Freq: Every evening | ORAL | 0 refills | Status: DC
Start: 2024-06-24 — End: 2024-06-25

## 2024-06-24 MED ADMIN — SODIUM CHLORIDE 0.9 % IV PGBK (MB+) [95161]: 100 mL | INTRAVENOUS | @ 12:00:00 | Stop: 2024-06-24 | NDC 00338915930

## 2024-06-24 NOTE — Progress Notes
 RT Adult Assessment Note    NAME:Hailey Rodgers             MRN: [id number]             DOB:16-Oct-1951          AGE: 73 y.o.  ADMISSION DATE: 06/23/2024             DAYS ADMITTED: LOS: 0 days    Additional Comments:  Impressions of the patient: Patient lying in bed with NAD. A&O.  Intervention(s)/outcome(s): O2, Cpox ordered along with bipap per RT protocol  Patient education that was completed: n/a  Recommendations to the care team: none    Vital Signs:  Pulse: 73  RR: 16 PER MINUTE  SpO2: 99 %  O2 Device: Nasal cannula  Liter Flow: 1 Lpm (titrated down. Sat 99%. does not wear at baseline)  O2%:      Breath Sounds:   Breath Sounds WDL: Within Defined Limits  Respiratory Effort: unlabored  Respiratory WDL: Within Defined Limits  Respiratory Effort/Pattern: Unlabored  Comments:

## 2024-06-24 NOTE — Consults
 [facility] Consult Note    During the day, please contact [facility] ([facility]). After hours, please contact the orthopedic surgery resident on call with any additional questions or concerns.      Admission Date: 06/23/2024                                                  Chief Complaint/Reason for Consult: Concern for spinal hardware that should and loosening    Assessment/Plan  Hailey Rodgers is a 73 y.o. female with pmh of RA, T2DM, CHF, and T12-pelvis decompression and fusion (March 2025, [facility]) who was admitted for UTI & diarrhea. Orthopedics was consulted for back pain in the setting of T12 pedicle screw lucency.  Patient has chronic back pain since her surgery and known T12 bilateral pedicle screw lucencies with a T12 superior endplate fracture.  CT exam imaging study available for review, however MRI is currently being obtained.  On exam, she has full strength and sensation to bilateral lower extremities with no upper motor neuron signs. We will follow the results of her MRI scans.    - Operative Intervention: No acute operative intervention at this time.   - Agree with admission to medicine for treatment of UTI  - Will follow-up results of thoracic and lumbar MRIs  - Antibiotics / Tetanus: per primary  - Pain Control: per primary  - Diet: per primary  - DVT PPX: Mechanical, Chemoprophylaxis per primary       Patient was evaluated and discussed with staff surgeon Dr. Christel at a clinically appropriate time.      Marinell Benedict  [id number]     ______________________________________________________________________    History of Present Illness: Hailey Rodgers is a 73 y.o. female with a past medical history of CHF, type 2 diabetes, rheumatoid arthritis, and T12 pelvis decompression and fusion on 03/04/2024 at [facility] who presents with back pain and was found to have a UTI.  Patient has a history of multiple UTIs in the past including fungal UTIs.  With regards to her spinal surgery, per the patient's daughter, she never got any better.  She reports a long rehab stay postoperatively and early loosening of the T12 pedicle screws bilaterally as well as an early postop T12 superior endplate fracture.  Patient's daughter reports that her back pain has not been significant frequently worse recently.  Patient has some baseline bowel and bladder incontinence but is not reporting any perianal decreased sensation.  She does not report any new numbness or weakness of bilateral lower extremities.    A significant portion of the history was obtained from patient's daughter at bedside along with chart review.    Past Medical History:    Age-related physical debility    Allergic rhinitis    Arthritis    Asthma    Bronchitis    Cancer (CMS-HCC)    Cervical stenosis of spinal canal    Convulsion (CMS-HCC)    Coronary artery disease    COVID-19 virus infection    Disorganized thinking    Diverticulosis    DM (diabetes mellitus) (CMS-HCC)    Dyslipidemia    Dyspnea    Fatty liver disease, nonalcoholic    Gastroesophageal reflux disease without esophagitis    GERD (gastroesophageal reflux disease)    Hypertriglyceridemia    Hypothyroidism    Hypothyroidism due to  acquired atrophy of thyroid    Infection    Leg weakness    Lumbar stenosis    Lung disease    Lung nodule    Memory loss    Movement disorder    Narcolepsy    Obesity    On supplemental oxygen therapy    OSA (obstructive sleep apnea)    Other dysphagia    Other hyperlipidemia    Peripheral neuropathy    Polypharmacy    Postmenopausal    Pseudoseizures    Recurrent major depressive disorder, in partial remission    Rhabdomyolysis    Rheumatoid arthritis (CMS-HCC)    Seizures (CMS-HCC)    Sleep disorder    Syncope and collapse    Type 2 diabetes mellitus with hyperglycemia, with long-term current use of insulin  (CMS-HCC)    Unspecified deficiency anemia    Urinary incontinence    Urinary problem    Vision decreased     Surgical History:   Procedure Laterality Date HX ADENOIDECTOMY  1962    HX TONSILLECTOMY  1962    TUBAL LIGATION  1977    RECTAL SURGERY  1979    for rectal prolapse; bladder repair    CHOLECYSTECTOMY  1984    HYSTERECTOMY  2000    BREAST BIOPSY Right 2012    COLONOSCOPY  2016    CERVICAL 3 TO THORACIC 7 DECOMPRESSION AND FUSION Bilateral 07/18/2015    Performed by Vivica Domino, MD at [facility] OR    BLEPHAROPLASTY      CARDIOVASCULAR STRESS TEST      CERVIX SURGERY  2016    ECHOCARDIOGRAM PROCEDURE  2022    ELECTROCARDIOGRAM      EVENT MONITOR      Have Loop monitor    HERNIA REPAIR  1984/2007    SEPTOPLASTY  1986/2013    TRANSESOPHAGEAL ECHO      UPPER GASTROINTESTINAL ENDOSCOPY       Social History     Tobacco Use    Smoking status: Never    Smokeless tobacco: Never   Vaping Use    Vaping status: Never Used   Substance Use Topics    Alcohol use: Never    Drug use: Never     Family History   Problem Relation Name Age of Onset    Cancer Mother [name]     Thyroid Disease Mother [name]     Hypertension Mother Psychiatric nurse     Cataract Mother [name]     Cancer Father RL         pancreatic    Cancer Other [name]     Stroke Paternal Grandfather Benjiamin!     Cancer Paternal Aunt      Coronary Artery Disease Paternal Grandmother      Diabetes Daughter [name]     Migraines Daughter [name]     Strabismus Neg Hx      Retinal Detachment Neg Hx      Neurologic Disorder Neg Hx      Macular Degen Neg Hx      Glaucoma Neg Hx      Blindness Neg Hx      Amblyopia Neg Hx      Autoimmune Disease Neg Hx       Allergies:  Amoxicillin-pot clavulanate, Gemfibrozil, Morphine , Trazodone, Adhesive tape (rosins), Atorvastatin, Latex, Bupropion, Cymbalta [duloxetine], and Levaquin [levofloxacin]    Outpatient Medications as of 06/24/2024   Medication Sig Dispense Refill    acetaminophen  (TYLENOL  EXTRA STRENGTH) 500 mg tablet Take  two tablets by mouth every 6 hours as needed. Max of 4,000 mg of acetaminophen  in 24 hours.  Indications: pain 90 tablet 0    acetaminophen  SR (TYLENOL  8 HOUR) 650 mg tablet Take one tablet by mouth every 8 hours. 100 tablet 0    albuterol  sulfate (PROAIR  HFA) 90 mcg/actuation HFA aerosol inhaler Inhale two puffs by mouth into the lungs every 6 hours as needed for Wheezing for 5 days. 6.7 g 0    aspirin  81 mg chewable tablet Chew one tablet by mouth daily. 5 tablet 0    aspirin  81 mg chewable tablet Chew one tablet by mouth at bedtime daily.      baclofen  (LIORESAL ) 10 mg tablet Take one tablet by mouth three times daily as needed for Muscle Cramps or Pain. Indications: muscle spasms caused by a spinal disease 90 tablet 1    bisacodyL  (DULCOLAX (BISACODYL )) 10 mg rectal suppository Insert or Apply one suppository to rectal area as directed daily as needed. 5 suppository 0    blood sugar diagnostic test strip Use one strip as directed before meals and at bedtime. ICD-10: Type 2 diabetes with hyperglycemia, insulin  use E11.65, Z79.4  Indications: type 2 diabetes mellitus 100 strip 1    blood-glucose meter kit ICD-10: Type 2 diabetes with hyperglycemia, insulin  use E11.65, Z79.4  Indications: type 2 diabetes mellitus 1 each 0    bumetanide  (BUMEX ) 1 mg tablet Take one tablet by mouth daily. Or as directed (Patient not taking: Reported on 04/07/2024) 90 tablet 3    calcium  carbonate (CALTRATE 600) 600 mg calcium  (1,500 mg) tablet Take two tablets by mouth twice daily. 60 tablet 0    calcium  carbonate (TUMS) 500 mg (200 mg elemental calcium ) chewable tablet Chew one tablet by mouth twice daily with meals. 10 tablet 0    celecoxib  (CELEBREX ) 200 mg capsule Take one capsule by mouth twice daily. (Patient not taking: Reported on 04/07/2024) 60 capsule 5    celecoxib  (CELEBREX ) 200 mg capsule Take one capsule by mouth twice daily. Indications: rheumatoid arthritis 60 capsule 1    CHOLEcalciferoL  (vitamin D3) (DIALYVITE VITAMIN D ) 125 mcg (5,000 unit) capsule Take one capsule by mouth every Sunday. 1 capsule 0    CHOLEcalciferoL  (vitamin D3) (OPTIMAL D3) 50,000 units capsule Take 1 capsule by mouth once weekly 12 capsule 5    diclofenac  sodium (ARTHRITIS PAIN (DICLOFENAC )) 1 % topical gel Apply four g topically to affected area three times daily as needed for mild pain. 100 g 0    diclofenac  sodium (ARTHRITIS PAIN (DICLOFENAC )) 1 % topical gel Apply four g topically to affected area three times daily. 100 g 0    diclofenac  sodium (VOLTAREN ) 1 % topical gel Apply 2 g topically 4 (four) times a day as needed. 50 g 0    dicyclomine  (BENTYL ) 20 mg tablet Take one tablet by mouth every 6-8 hours as needed for pain. 20 tablet 0    docusate (COLACE) 100 mg capsule Take two capsules by mouth twice daily.      dulaglutide  (TRULICITY ) 1.5 mg/0.5 mL injection pen Inject 0.5 mL under the skin every 7 days. 2 mL 0    dulaglutide  (TRULICITY ) 1.5 mg/0.5 mL injection pen Inject 1.5 mg under the skin every 7 (seven) days. 2 mL 0    famotidine  (PEPCID ) 40 mg tablet Take one tablet by mouth daily. 30 tablet 0    ferrous sulfate  (FEOSOL) 325 mg (65 mg iron ) tablet Take one tablet by mouth twice  daily. Take on an empty stomach at least 1 hour before or 2 hours after food. (Patient not taking: Reported on 04/07/2024)      fexofenadine (ALLEGRA) 180 mg tablet Take one tablet by mouth daily. 5 tablet 0    fexofenadine(+) (ALLEGRA) 180 mg tablet Take one tablet by mouth daily.      fluocinolone acetonide oil (DERMOTIC OIL) 0.01 % otic drops INSTILL 3 DROPS INTO NOSE DAILY AS NEEDED (Patient not taking: Reported on 04/07/2024)      fluticasone  propionate (FLONASE  ALLERGY RELIEF) 50 mcg/actuation nasal spray, suspension Apply one spray to each nostril as directed twice daily for 5 days. 16 g 0    folic acid  (FOLVITE ) 1 mg tablet Take four tablets by mouth daily. 360 tablet 3    hydrocortisone  acetate (ANUSOL -HC) 25 mg rectal suppository Insert or Apply one suppository to rectal area as directed twice daily. 10 suppository 0    insulin  aspart (U-100) (NOVOLOG  FLEXPEN U-100 INSULIN ) 100 unit/mL (3 mL) PEN Inject thirty Units under the skin three times daily before meals. 15 mL 0    insulin  aspart (U-100) (NOVOLOG  FLEXPEN U-100 INSULIN ) 100 unit/mL (3 mL) PEN Inject thirty Units under the skin three times daily with meals with a sliding scale 201-250 = +3 units, 251-300 = +6 units, greater than 300 = +9 units, Total Daily units : 117 units (Patient not taking: Reported on 04/07/2024) 30 mL 3    insulin  glargine (LANTUS  SOLOSTAR U-100 INSULIN ) 100 unit/mL (3 mL) subcutaneous PEN Inject forty five Units under the skin every morning. 15 mL 0    insulin  glargine (LANTUS  SOLOSTAR U-100 INSULIN ) 100 unit/mL (3 mL) subcutaneous PEN Inject 40 Units under the skin nightly. (Patient not taking: Reported on 04/07/2024) 15 mL 0    JARDIANCE  25 mg tablet Take one tablet by mouth daily. (Patient not taking: Reported on 04/07/2024)      lactobacillus rhamnosus GG (CULTURELLE) 15 billion cell capsule Take one capsule by mouth as directed daily.      lancets 33 gauge (ONETOUCH DELICA PLUS LANCET) 33 gauge Use 3 times per day to monitor capillary blood glucose. 300 each 3    lancets 33 gauge 33 gauge Use one each as directed before meals and at bedtime. ICD-10: Type 2 diabetes with hyperglycemia, insulin  use E11.65, Z79.4  Indications: type 2 diabetes mellitus 100 each 1    LANTUS  SOLOSTAR U-100 INSULIN  100 unit/mL (3 mL) subcutaneous PEN Inject fifty Units under the skin every morning AND fifty Units at bedtime daily. 30 mL 5    levothyroxine  (SYNTHROID ) 88 mcg tablet Take one tablet by mouth daily. 5 tablet 0    levothyroxine  (SYNTHROID ) 88 mcg tablet Take one tablet by mouth every morning. 90 tablet 1    lidocaine  (LIDOCAINE  PAIN RELIEF) 4 % topical patch Apply two patches topically to affected area daily. 10 patch 0    lidocaine  (LIDODERM ) 5 % topical patch Place 1 patch on the skin daily. Remove & Discard patch within 12 hours or as directed by MD 30 patch 0    linaCLOtide (LINZESS) 72 mcg capsule Take one capsule by mouth daily before breakfast. 30 capsule 0    methocarbamoL  (ROBAXIN ) 500 mg tablet Take one tablet by mouth three times daily as needed. 90 tablet 0    methocarbamoL  (ROBAXIN ) 500 mg tablet Take one tablet by mouth three times daily. (Patient not taking: Reported on 04/07/2024) 90 tablet 0    methocarbamoL  (ROBAXIN ) 750 mg tablet Take one  tablet by mouth three times daily as needed for spasms. 15 tablet 0    methotrexate  PF 25 mg/mL injection Inject 1 mL under the skin every 7 days. Single use vials. Only use for one dose ,then discard. (Patient not taking: Reported on 04/07/2024) 24 mL 0    methotrexate  sodium 2.5 mg tablet Take ten tablets by mouth every Monday. 10 tablet 0    milk of magnesium  (MILK OF MAGNESIA) 400 mg/5 mL oral suspension Take 30 mL by mouth daily as needed for constipation. 360 mL 0    milk of magnesium  400 mg/5 mL oral suspension Take 30 mL by mouth daily as needed. 300 mL 0    mirtazapine  7.5 mg tablet Take one tablet by mouth daily. 30 tablet 0    multivitamin with folic acid  (TAB-A-VITE) 400 mcg tab Take one tablet by mouth daily. 100 tablet 0    mupirocin  (BACTROBAN ) 2 % topical ointment Apply 1 application in the nostrils twice a day 30 g 6    naloxone  (NARCAN ) 4 mg/actuation nasal spray Give 1 spray in one nostril if opioid overdose suspected. If no response in 3 minutes, repeat dose in other nostril with 2nd spray device. 2 each 1    nitroglycerin  (NITROSTAT ) 0.4 mg tablet Place one tablet under tongue every 5 minutes as needed for Chest Pain. Max 3 tablets, call 911. (Patient not taking: Reported on 04/07/2024) 25 tablet 3    nystatin  (MYCOSTATIN ) 100,000 unit/g topical cream Apply  topically to affected area twice daily for 5 days 15 g 0    nystatin  (MYCOSTATIN ) 100,000 unit/g topical cream Apply  topically to affected area twice daily as needed.      ondansetron  (ZOFRAN  ODT) 8 mg rapid dissolve tablet Dissolve one tablet by mouth every 8 hours as needed.      oxyCODONE  (ROXICODONE ) 5 mg tablet Take one tablet by mouth every 6 hours as needed for moderate Pain. 5 tablet 0    oxyCODONE  10 mg tablet Take one tablet by mouth every 6 hours as needed.  Max 40mg /day 15 tablet 0    pantoprazole  DR (PROTONIX ) 40 mg tablet Take one tablet by mouth daily. (Patient not taking: Reported on 04/07/2024) 90 tablet 3    pantoprazole  DR (PROTONIX ) 40 mg tablet Take one tablet by mouth twice daily. (Patient not taking: Reported on 04/07/2024) 180 tablet 3    pen needle, diabetic (BD NANO 2ND GEN PEN NEEDLE) 32 gauge x 5/32 pen needle Use  as directed 5 times daily 100 each 0    polyethylene glycol 3350  (MIRALAX ) 17 g packet Take one packet by mouth daily. 10 packet 0    polyethylene glycol 3350  (MIRALAX ) 17 g packet Take one packet by mouth twice daily as needed. 12 each     pramipexole  (MIRAPEX ) 0.75 mg tablet Take 1 tablet (0.75 mg total) by mouth 2 (two) times a day. 60 tablet 0    pramipexole  (MIRAPEX ) 0.75 mg tablet Take one tablet by mouth daily. 90 tablet 3    predniSONE  (DELTASONE ) 2.5 mg tablet Take 3 tablets (7.5 mg total) by mouth daily. 90 tablet 0    pregabalin  (LYRICA ) 25 mg capsule Take one capsule by mouth three times daily. 90 capsule 5    pregabalin  (LYRICA ) 50 mg capsule Take 1 capsule (50 mg total) by mouth 3 (three) times a day. 90 capsule 0    rosuvastatin  (CRESTOR ) 20 mg tablet Take one tablet by mouth daily. 90 tablet 3  sennosides-docusate sodium  (SENNA-S) 8.6/50 mg tablet Take one tablet by mouth daily. 30 tablet 0    spironolactone  (ALDACTONE ) 25 mg tablet Take one tablet by mouth daily. Take with food. 90 tablet 3    triamcinolone  acetonide 0.5 % ointment Apply  topically to affected area three times daily as needed. (Patient not taking: Reported on 04/07/2024)      TRULICITY  3 mg/0.5 mL injection pen       venlafaxine  XR (EFFEXOR  XR) 150 mg capsule Take one capsule by mouth daily. 90 capsule 3    venlafaxine  XR (EFFEXOR  XR) 75 mg capsule Take three capsules by mouth daily. 15 capsule 0    venlafaxine  XR (EFFEXOR  XR) 75 mg capsule Take three capsules by mouth daily. 90 capsule 0    venlafaxine  XR (EFFEXOR  XR) 75 mg capsule Take three capsules by mouth daily. 90 capsule 0    vibegron  (GEMTESA ) 75 mg tablet Take one tablet by mouth at bedtime daily for 5 days. 5 tablet 0    vibegron  (GEMTESA ) 75 mg tablet Take one tablet by mouth at bedtime daily. 30 tablet 3    vitamins, multi w/minerals 9 mg iron -400 mcg tab Take one tablet by mouth daily.           Physical Exam:    Physical Exam:  General appearance: No acute distress   Lungs: unlabored respirations, equal chest rise bilaterally  Heart: RR  Abdomen: Soft, non-tender, non-distended    MOTOR:  Upper Ext. Deltoid Triceps Biceps Wrist Ext Wrist Flex Grip Interossei   Right 5 5 5 5 5 5 5    Left 5 5 5 5 5 5 5      Lower Ext. Hip Flex Quads Hamstrings Plantarflex Dorsiflex EHL   Right 5 5 5 5 5 5    Left 5 5 5 5 5 5      SENSATION:  Upper extremity: Sensation grossly intact to light touch C5-T1 distributions  Lower extremity: Sensation grossly intact to light touch L3-S1 distributions      - Negative Hoffman's bilaterally  - No clonus      Review of Systems:  10 Point Review of Systems obtained. Pertinent items noted in HPI.     Vital Signs:  Last Filed in 24 hours   BP: 123/59 (07/03 0300)  Temp: 36.7 ?C (98.1 ?F) (07/02 2051)  Pulse: 82 (07/03 0300)  Respirations: 15 PER MINUTE (07/03 0300)  SpO2: 95 % (07/03 0300)  O2 Device: None (Room air) (07/02 2051)  Height: 168.9 cm (5' 6.5) (07/02 2051)     Lab/Radiology/Other Diagnostic Tests:    CBC w/Diff   Lab Results   Component Value Date/Time    WBC 8.60 06/23/2024 10:04 PM    HGB 13.3 06/23/2024 10:04 PM    HCT 40.9 06/23/2024 10:04 PM    PLTCT 258 06/23/2024 10:04 PM        Inflammatory Markers   Lab Results   Component Value Date/Time    ESR 40 (H) 06/24/2024 03:17 AM    CRP 3.41 (H) 06/23/2024 10:04 PM        Basic Metabolic Profile   Lab Results   Component Value Date/Time    NA 136 (L) 06/23/2024 10:04 PM    K 3.6 06/23/2024 10:04 PM    CL 100 06/23/2024 10:04 PM    CO2 24 06/23/2024 10:04 PM    GAP 12 06/23/2024 10:04 PM    BUN 20 06/23/2024 10:04 PM    CR 0.57 06/23/2024 10:04 PM  GLU 161 (H) 06/23/2024 10:04 PM        Coagulation Studies   Lab Results   Component Value Date/Time    PT 12.4 12/18/2023 11:18 PM    PTT 26.9 12/18/2023 11:18 PM    INR 1.1 12/18/2023 11:18 PM        Radiology:   MRI T-SPINE WO/W CONTRAST  Result Date: 06/24/2024  MRI of the thoracic and lumbar spine HISTORY: Concern for hardware infection TECHNIQUE: MRI of the thoracic and lumbar spine was performed prior to and following the uneventful administration of contrast. Comparison: CT abdomen and pelvis 06/24/2024 FINDINGS: Posterior instrumented fusion spanning T12 through the sacroiliac joints is noted with extensive metallic artifacts degrading regional structures, including limiting evaluation of the spinal canal and bone marrow. Findings of T12 pedicle screws ascending and periprosthetic fracture of T12 are seen to better advantage on comparison CT abdomen and pelvis. There is fluid and edema across the superior T12 fracture plane. Scalloped height loss involving the inferior endplate of T11 is noted with edematous and faintly enhancing marrow signal change underlying the endplate defect. No paraspinous phlegmon or obvious extending epidural collection in the lower thoracic canal. Superior endplate mild compression deformity of L2 is unchanged. Fibrofatty degenerative endplate changes at T10-T11. Diffuse thoracic disc degeneration, disc bulging, and disc protrusions with mild diffuse thoracic spinal stenosis. Mild degenerative thoracic foraminal stenosis from T8 through T12. No obvious aggressive destructive process involving the lumbar spine. Psoas muscular intensity and size are within expected limits. No lumbar prevertebral space abscess or paraspinous phlegmon.     1.  Long segment spinal posterior instrumentation spanning T12 through the sacroiliac joints. Periprosthetic fracture of T12 with pedicle screw loosening more completely assessed on same-day CT abdomen and pelvis. 2.  Scalloped inferior endplate height loss of T11 with mild enhancing marrow edema of the T11 vertebral body and fluid within the T12 fracture plane likely represents benign edema secondary to compression injuries, junctional mechanical factors, and reactive osteitis. Superinfection is less likely, particularly given absent secondary paraspinous or epidural inflammatory findings.  Finalized by Mabel Dunks, M.D. on 06/24/2024 5:45 AM. Dictated by Mabel Dunks, M.D. on 06/24/2024 5:28 AM.    MRI L-SPINE WO/W CONTRAST  Result Date: 06/24/2024  MRI of the thoracic and lumbar spine HISTORY: Concern for hardware infection TECHNIQUE: MRI of the thoracic and lumbar spine was performed prior to and following the uneventful administration of contrast. Comparison: CT abdomen and pelvis 06/24/2024 FINDINGS: Posterior instrumented fusion spanning T12 through the sacroiliac joints is noted with extensive metallic artifacts degrading regional structures, including limiting evaluation of the spinal canal and bone marrow. Findings of T12 pedicle screws ascending and periprosthetic fracture of T12 are seen to better advantage on comparison CT abdomen and pelvis. There is fluid and edema across the superior T12 fracture plane. Scalloped height loss involving the inferior endplate of T11 is noted with edematous and faintly enhancing marrow signal change underlying the endplate defect. No paraspinous phlegmon or obvious extending epidural collection in the lower thoracic canal. Superior endplate mild compression deformity of L2 is unchanged. Fibrofatty degenerative endplate changes at T10-T11. Diffuse thoracic disc degeneration, disc bulging, and disc protrusions with mild diffuse thoracic spinal stenosis. Mild degenerative thoracic foraminal stenosis from T8 through T12. No obvious aggressive destructive process involving the lumbar spine. Psoas muscular intensity and size are within expected limits. No lumbar prevertebral space abscess or paraspinous phlegmon.     1.  Long segment spinal posterior instrumentation spanning T12 through the  sacroiliac joints. Periprosthetic fracture of T12 with pedicle screw loosening more completely assessed on same-day CT abdomen and pelvis. 2.  Scalloped inferior endplate height loss of T11 with mild enhancing marrow edema of the T11 vertebral body and fluid within the T12 fracture plane likely represents benign edema secondary to compression injuries, junctional mechanical factors, and reactive osteitis. Superinfection is less likely, particularly given absent secondary paraspinous or epidural inflammatory findings.  Finalized by Mabel Dunks, M.D. on 06/24/2024 5:45 AM. Dictated by Mabel Dunks, M.D. on 06/24/2024 5:28 AM.    CT ABD/PELV W CONTRAST  Result Date: 06/24/2024  CT ABDOMEN AND PELVIS INDICATION: Back pain, diarrhea, foul-smelling urine. Attention to lumbar spine. Please also inquire acquired T11 and T12. Status post fusion with known fracture of T12. Severe Back Pain, Fevers, Diarrhea, Abdominal pain, Nausea/Dry Heaves, Foul Smelling urine and poop - Entered by patient / X 3 days with fever. Surgery 03/04/2024 TECHNIQUE: Multiple contiguous axial images were obtained through the abdomen and pelvis following the administration of IV contrast material. Portal venous phase of postcontrast imaging was obtained. COMPARISON: Chest CT 11/16/2023, MRI lumbar spine 08/04/2021. Outside imaging reports MRI thoracic and lumbar spine 05/12/2024 and CT lumbar spine 05/11/2024 FINDINGS: Lower Thorax: Coronary artery and aortic valvular calcifications. Minor lower lung atelectasis and/or scarring. Liver and Biliary system: Cholecystectomy with mild intrahepatic and extrahepatic biliary ductal dilatation. Unremarkable liver. Spleen: Unremarkable. Adrenal Glands and Kidneys: Unremarkable. Pancreas and Retroperitoneum: Unremarkable. Aorta and Major Vessels: Mild calcified aortobiiliac atherosclerotic plaque. Bowel, Mesentery, and Peritoneal space: No bowel obstruction. Normal appendix. No ascites or free air. Pelvis: Circumferential thickening of the urinary bladder wall. No pelvic lymphadenopathy. Hysterectomy. Abdominal Wall and Osseous Structures: *  Soft tissue density masses and cutaneous thickening in the ventral right and left abdominal wall, likely scarring. *  In keeping with prior vertebral body numbering, transitional anatomy with rudimentary ribs at T12. *  Posterior spinal fixation from T12 through the sacrum with bilateral sacroiliac screws. *  There is irregular lucency about bilateral T12 pedicle screws with coexistent superior endplate cortical disruption and intraosseous gas and mild vertebral body height loss. Additional heterogeneous sclerosis of the T12 vertebral body. Convex lucency of the inferior endplate of T11 with partially defined sclerotic rim. No organized surrounding fluid collection or obvious prevertebral soft tissue thickening or psoas muscular enlargement. *  Mild superior endplate compression deformity of L2 with mild vertebral body height loss. *  Grade 1 anterolisthesis at L3-L4. Trace anterolisthesis at L4-L5.     1.  Prior laminectomies and posterior instrumented fusion from T12 through the sacrum with superior endplate compression deformities of T12 and L2 with mild vertebral body height loss, previously described on outside imaging 05/12/2024 (this imaging is not available for review). 2.  Fluid and gas within the T12 vertebral body with sclerosis throughout the vertebral body and partially marginated scalloped lucency of the adjacent inferior endplate of T11. These findings may be entirely related to a combination of postsurgical changes, recent fracture, and periprosthetic loosening/mechanical factors. However, superimposed discitis/osteomyelitis is a diagnostic consideration. Dedicated MRI of the thoracic and lumbar spine with contrast is recommended for further evaluation in the absence of recent comparative films to determine stability. 3.  Circumferential thickening of the urinary bladder wall consistent with cystitis given urinalysis results. Findings were discussed with Dr. Roseanna by Dr. Dunks by telephone at 2:26 AM 06/24/2024. By my electronic signature, I attest that I have personally reviewed the images for this examination and formulated the interpretations and  opinions expressed in this report  Finalized by Mabel Dunks, M.D. on 06/24/2024 2:28 AM. Dictated by Jeoffrey Plater, MD on 06/24/2024 1:49 AM.    XR Spine Scoliosis 2 or 3 views  Result Date: 06/01/2024  Patient:   Hailey Rodgers, Hailey Rodgers  S Sex#: F                             DOB#: 1951/05/16             Corp#: [id number] Location: [facility]    Accession#: [id number] Ordering Provider:   CAMMIE SINGLE Procedure Requested:  [id number] XR SPINE SCOLIOSIS 2 OR 3 VIEWS Reason for Exam:  S/P lumbar fusion Exam Ordered:        06/01/2024  1337 Begin exam date/time:  06/01/2024  1343 Exam Date/Time:      06/01/2024  1428 XR SPINE SCOLIOSIS 2 OR 3 VIEWS DATE: 06/01/2024 2:31 PM INDICATION:  S/P lumbar fusion     COMPARISON: Scoliosis radiographs from 04/09/2024. CT from 04/30/2024 and 05/11/2024. MRI exams from 05/12/2024. FINDINGS:   There are 12 pairs of ribs and 6 lumbar type vertebrae. This was also noted on the prior scoliosis radiographs. Numbering will be performed to maintain concordance with the prior reports. Numbering will be again performed from inferiorly, to maintain consistency with prior reports and operative report. Mild thoracolumbar dextroscoliosis again noted. Curvature again measures approximately 16 degrees, when measured from the superior endplate of T9 to the inferior endplate of L3, utilizing the above described and prior numbering conventions. Changes of prior multilevel fusion, which has previously been designated as extending from T12 through the pelvis/sacrum, with hardware spanning the SI joints. Severe progression of the T12 compression fracture deformity is now seen. Severe anterior vertebral body height loss, progressed from prior radiographs and cross-sectional imaging. The T12 surgical screws extend superior to this vertebral body, likely into the above disc space, and may now contact the adjacent T11 vertebral body. There is resultant kyphosis centered at this level, with kyphosis of approximately 34 degrees, when measured from the superior endplate of T11 to the inferior endplate of L1, again utilizing the same numbering convention. Partially imaged multilevel cervical posterior decompression, and posterior instrumented fusion at C3-C7, when labeling from superiorly. Osseous demineralization. Multilevel spondylosis. Partial visualization of known mild chronic superior endplate compression fracture deformity at L2. Posterior element fracture at this level is better seen and further described on prior CTs. Grade 1 anterolisthesis at L3-L4 and L4-L5. Loop recorder device is again seen. Increased interstitial markings within the lungs. Severe degenerative changes of the shoulders.    1.  Variant spinal anatomy, as discussed in detail on prior scoliosis radiographs from 04/09/2024. Numbering is again performed to maintain concordance with prior imaging and surgical reports. The surgical hardware will again be labeled as extending from T12 through the pelvis. 2.  Significant interval progression of height loss at the compression fracture deformity involving the superior most hardware level, previously designated as T12. Near complete anterior vertebral body height loss is now present, which has significantly progressed from prior radiographs as well as the more recent cross-sectional imaging. Resultant kyphosis centered at T12. The surgical screws at this level now both appear to extend through the superior cortex of this vertebral body. 3.  Redemonstrated multilevel spondylosis, and mild thoracolumbar dextroscoliosis. Findings were conveyed via [employer] with Dr. SINGLE CAMMIE and neurosurgical nurse Delon Needle, at 06/01/2024 3:41 PM. READING SITE: [facility]  Imaging Finalized by: DELENA Francis Sailors, MD on 06/01/2024 3:45 PM

## 2024-06-24 NOTE — Progress Notes
 08:51 -- Notified Dr. Norman Louder about patient bladder scan at 08:41. Patient had recently attempted to void, but was unable to. Patient reporting intermittent chills; however vitals obtained @ 08:26 all WDL (see flowsheets). Also informed Dr. Louder that 06:53 dose of cefepime  did not actually get delivered to patient as the line was clamped (see MAR).     08:55 -- Per Dr. Louder, okay to straight cath at this time. PRN SC orders added. Provider to review medication orders to see if abx treatment needs to be adjusted.     12:00 -- Notified Dr. Louder that patient is stuperous. Barely awakens to sternal rub, then quickly fall back to sleep. Vitals obtained at 11:08 stable (see flowsheets). Patient running NSR on telemetry. Informed that patient has currently not taken any of her medications d/t falling asleep and being difficult to awaken.     12:08 -- Provider to assess patient. Med orders adjusted.     12:45 -- Informed Dr. Louder that patient has now taken her morning medications, and reports a history of seizures. Per patient report, does not take anything for seizures, and that when one occurs, she feels funny and then I fall asleep. RN placing patient in seizure precautions.     12:46 -- Dr. Louder acknowledged message. Q4 neuro checks to be added.

## 2024-06-24 NOTE — Case Management (ED)
 Cardiovascular Supplies Voucher Assessment  Date: 06/24/24    Requested Supplies: BP Monitor and Scale    Is this patient eligible for a CV supplies voucher? Yes  Date of last supplies voucher: N/A  Charitable Fund to be utilized: BP Monitor - CV Funding and Scale - CV Funding    Reminders:  BP Monitors, Scale, Bariatric Scale Vouchers are to be used once during a 1-month period.   [employer] BP Monitor are only available to be vouchered once in a lifetime.     SW/NCM Name: Randall Ness [facility]  Pager/Cell: [phone]

## 2024-06-24 NOTE — Care Coordination-Inpatient
 Med Private Night 7- [phone] will take calls on this patient until 8:00 AM. Afterwards, please contact first on call for the designated service listed on chart.         AOD

## 2024-06-24 NOTE — Case Management (ED)
 Case Management Progress Note    NAME:Hailey Rodgers                          MRN: [id number]              DOB:Sep 23, 1951          AGE: 73 y.o.  ADMISSION DATE: 06/23/2024             DAYS ADMITTED: LOS: 0 days      Today's Date: 06/24/2024    PLAN:   Attempted to see patient x2 today for assessment and unable to arouse patient.  Spoke with daughter [name] this morning but she was on her way to a funeral and said she would call back.     Expected Discharge Date: 06/28/2024   Is Patient Medically Stable: No, Please explain: IV abx  Are there Barriers to Discharge? no    INTERVENTION/DISPOSITION:  Discharge Planning               Reviewed EMR, attended huddle   Transportation                 Support                 Info or Referral                 Positive SDOH Domains and Potential Barriers      Medication Needs                                Financial                 Legal                 Other                 Discharge Disposition                                                                                          Randall Ness, BSN, RN, [facility]  Integrated Nurse Case Manager  Available on [employer] [phone]

## 2024-06-24 NOTE — Progress Notes
 Profile updated, care plan/education reviewed, and call light within reach.

## 2024-06-24 NOTE — Progress Notes
 Pharmacy Vancomycin  Initiation Note  Subjective:   Hailey Rodgers is a 73 y.o. female being treated for bone/joint infection of the spine.    Assessment:   Target levels for this patient:  1.  AUC (mcg*h/mL):  400-600  2.  Trough (mcg/mL): 15-20    Plan:   1500mg  load in ED, continue with 1g IV Q 12 hours  Next scheduled level(s): TBD by day shift pharmacist  Pharmacy will continue to monitor and adjust therapy as needed.    Objective:     Current Vancomycin  Orders   Medication Dose Route Frequency    vancomycin  (VANCOCIN ) 1,000 mg in sodium chloride  0.9% 250 mL IVPB (Vial/Bag Adapter)  1,000 mg Intravenous Q12H*    vancomycin , pharmacy to manage  1 each Service Per Pharmacy       Recent Vancomycin  Dosing and Administration:  Recent Vancomycin  Admin                     vancomycin  (VANCOCIN ) 1,500 mg in sodium chloride  0.9% 250 mL IVPB (Vial/Bag Adapter) (mg) 1,500 mg New Bag 06/24/24 0332                    Start date of vancomycin  therapy: 06/24/2024  Additional Abx: cefepime   Cultures:  ,  ,  ,    White Blood Cells   Date/Time Value Ref Range Status   06/23/2024 2204 8.60 4.50 - 11.00 10*3/uL Final     Creatinine   Date/Time Value Ref Range Status   06/23/2024 2204 0.57 0.40 - 1.00 mg/dL Final     Blood Urea Nitrogen   Date/Time Value Ref Range Status   06/23/2024 2204 20 7 - 25 mg/dL Final     Estimated CrCl: 75 mL/min    Intake/Output Summary (Last 24 hours) at 06/24/2024 0622  Last data filed at 06/23/2024 2329  Gross per 24 hour   Intake 100 ml   Output --   Net 100 ml      Actual Weight:  77 kg (169 lb 12.1 oz)    Prentice Bees, [facility]  06/24/2024

## 2024-06-24 NOTE — ED Notes
 Pt going to MRI via transport and then to [facility] - nurse notified. 2.5 Valium  administered for hx of claustrophobia per Dr. Wynona.

## 2024-06-25 LAB — PROTIME INR (PT)
~~LOC~~ BKR INR: 1.2 % — ABNORMAL HIGH (ref 0.9–1.2)
~~LOC~~ BKR PROTIME: 13 s — ABNORMAL LOW (ref 9.9–14.2)

## 2024-06-25 LAB — PTT (APTT): ~~LOC~~ BKR PTT: 28 s — ABNORMAL LOW (ref 24.0–36.5)

## 2024-06-25 LAB — SHIGA TOXINS EIA

## 2024-06-25 LAB — POC GLUCOSE
~~LOC~~ BKR POC GLUCOSE: 174 mg/dL — ABNORMAL HIGH (ref 70–100)
~~LOC~~ BKR POC GLUCOSE: 68 mg/dL — ABNORMAL LOW (ref 70–100)
~~LOC~~ BKR POC GLUCOSE: 138 mg/dL — ABNORMAL HIGH (ref 70–100)
~~LOC~~ BKR POC GLUCOSE: 71 mg/dL (ref 70–100)
~~LOC~~ BKR POC GLUCOSE: 72 mg/dL (ref 70–100)
~~LOC~~ BKR POC GLUCOSE: 74 mg/dL (ref 70–100)

## 2024-06-25 LAB — CBC
~~LOC~~ BKR MCV: 80 fL — ABNORMAL LOW (ref 80.0–100.0)
~~LOC~~ BKR RBC COUNT: 4.4 10*6/uL (ref 4.00–5.00)
~~LOC~~ BKR WBC COUNT: 5.6 10*3/uL (ref 4.50–11.00)

## 2024-06-25 MED ORDER — INSULIN GLARGINE 100 UNIT/ML (3 ML) SC INJ PEN
36 [IU] | Freq: Two times a day (BID) | SUBCUTANEOUS | 0 refills | Status: DC
Start: 2024-06-25 — End: 2024-06-26
  Administered 2024-06-25: 13:00:00 36 [IU] via SUBCUTANEOUS

## 2024-06-25 MED ORDER — INSULIN ASPART 100 UNIT/ML SC FLEXPEN
25 [IU] | Freq: Three times a day (TID) | SUBCUTANEOUS | 0 refills | Status: DC
Start: 2024-06-25 — End: 2024-06-25

## 2024-06-25 MED ORDER — INSULIN ASPART 100 UNIT/ML SC FLEXPEN
25 [IU] | Freq: Three times a day (TID) | SUBCUTANEOUS | 0 refills | Status: DC
Start: 2024-06-25 — End: 2024-06-26

## 2024-06-25 NOTE — Progress Notes
 1746: This RN notified Dr. Leontine Specking in concern for patient's safety as patient has made multiple comments through the day that she wishes she'd just go to sleep and not wake back up. When asked follow up questions to try and evaluate if patient is actively suicidal, patient states that she has no current plan. However, she admitted to having a plan in the past and stated that she wish she had just taken all the pills she had in the past and that then she wouldn't still be here. Patient states that she's in too much pain and that she'd rather be dead. Asked patient if she would like to talk with chaplin or psych RN and patient then told this RN that she's already told me too much.    1751: Dr. Specking acknowledged. New orders for CO and Psych Consult tomorrow.

## 2024-06-25 NOTE — Progress Notes
 General Progress Note    Name: Hailey Rodgers        MRN: [id number]          DOB: Oct 31, 1951            Age: 73 y.o.  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   female with rheumatoid arthritis on chronic pre,  OSA on BIPAP, chronic heart failure with preserved EF,  CAD , HTN, dyslipidemia, IDDM type 2, history of urinary retentions ,  history of b  T12-iliac decompression and fusion ~ 01/2024  presented to [place] ED on 06/23/24 for fever along with  acute on chronic back pain , diarrhea, foul urine/incontinence    Fever secondary to UTI due to E coli  History of urinary retention s/p multiple foley in the past  - UA:p  positive nitrite, 3+ leukocytes, packed WBCs            - 2-3 days dysuria and fever PTA  Plan:  > Obtain bladder scan q8h x 3 post voice, F/u on blood cultures, urine culutres   > Continue ceftriaxone  ( 7/3 - present)  - Due to recurrent urinary retention - will hold off gemtesa .    Fluid and gas within T12 vertebral body, lucency and T11. Hx of T12 iliac decompression/fusion on 01/2024  Acute on chronic back pain   - CT ABD/PELV W CONTRAST7/02/2024  1.  Prior laminectomies and posterior instrumented fusion from T12 through the sacrum with superior endplate compression deformities of T12 and L2 with mild vertebral body height loss, previously described on outside imaging 05/12/2024 (this imaging is not available for review). 2.  Fluid and gas within the T12 vertebral body with sclerosis throughout the vertebral body and partially marginated scalloped lucency of the adjacent inferior endplate of T11. These findings may be entirely related to a combination of postsurgical changes, recent fracture, and periprosthetic loosening/mechanical factors. However, superimposed discitis/osteomyelitis is a diagnostic consideration. Dedicated MRI of the thoracic and lumbar spine with contrast is recommended for further evaluation in the absence of recent comparative films to determine stability. 3.  Circumferential thickening of the urinary bladder wall consistent with cystitis given urinalysis results. F  - OSH 05/12/24 MRI T spine: Numbering scheme is based on the lumbar spine with the   wedging deformity and signal abnormalities at T12 noted as discussed in  the CT and MR lumbar spine.  There is some mild to moderate diffuse thoracic spondylosis with no  additional compression deformity seen. Moderate posterior ridging and  spurring is noted with mild to moderate canal narrowing  as discussed above. No intrinsic cord signal abnormalities or additional enhancing abnormalities are noted.   - osh 05/12/24 MRI L spine: moderate periimplant lucency surrounding the bilateral T12 pedicle   screws demonstrated on recent CT is associated with diffuse marrow edema throughout the entirety of the vertebral body, anterior superior  endplate fracture, and fluid-filled fracture cleft. The marrow signal  abnormality could be secondary to mechanical loosening and fracture.   - MRI spine : 1.  Long segment spinal posterior instrumentation spanning T12 through the   sacroiliac joints. Periprosthetic fracture of T12 with pedicle screw loosening more completely assessed on same-day CT abdomen and pelvis.   2.  Scalloped inferior endplate height loss of T11 with mild enhancing   marrow edema of the T11 vertebral body and fluid within the T12 fracture   plane likely represents benign edema secondary to compression injuries,   junctional mechanical factors, and reactive osteitis. Superinfection is   less likely, particularly given absent secondary paraspinous or epidural   inflammatory findings.   Plan  > Continue PTA oxycodone  PRN , lyrica  100 mg TID, robaxin  , tylenol  PRN, lidocaine  patch  - PT / OT         Diarrhea(non bloody)- obtain C-diff PCR, stool culture, giardia , crypto - negative   - Diarrhea improving 7/4, CTM      HLD  Chronic heart failure with preserved EF  OSA on BIPAP  - No oxygen at baseline                  > Continue PTA aspirin                 > Continue PTA Crestor                 > Continue to hold bumex  and spirolactone for now. Daughter reports patient's bumex  and spirolactone has been HOLD since 02/2024 hospitalization. 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 This is restarted 3 weeks PTA with last dose on Sunday  06/20/24, likely can skip 1 week due to infection then resume      Hypothyroidism -continue PTA synthroid   GERD - continue PTA PPI        FEN: no IVF, electrolytes reviewed, No diet orders on file   VTE Prophalyxis: lovenox   Disposition: continued admisison   Code status: Full Code  (Discussed on 06/24/2024)      Total Time Today was >36 minutes in the following activities: Preparing to see the patient, Obtaining and/or reviewing separately obtained history, Performing a medically appropriate examination and/or evaluation, Counseling and educating the patient/family/caregiver, Ordering medications, tests, or procedures, Referring and communication with other health care professionals (when not separately reported), Documenting clinical information in the electronic or other health record, and Independently interpreting results (not separately reported) and communicating results to the patient/family/caregiver                 Wound:      Wounds Skin tear Anterior;Left Knee (Active)   06/24/24 0620   Wound Type: Skin tear   Orientation: Anterior;Left   Location: Knee   Wound Location Comments: scab   Initial Wound Site Closure:    Initial Dressing Placed:    Initial Cycle:    Initial Suction Setting (mmHg):    Pressure Injury Stages:    Pressure Injury Present Within 24 Hours of Hospital Admission:    If This Pressure Injury Is Suspected to Be Device Related, Please Select the Device::    Is the Wound Open or Closed:    Wound Assessment Dry;Scab 06/25/24 0813   Peri-wound Assessment Dry;Intact 06/25/24 0813   Wound Drainage Amount None 06/25/24 0813   Wound Dressing Status None/open to air 06/25/24 0813   Number of days: 1       Wounds Skin tear Anterior;Left Arm (Active)   06/24/24 0620   Wound Type: Skin tear   Orientation: Anterior;Left   Location: Arm   Wound Location Comments: scab from previous fall   Initial Wound Site Closure:    Initial Dressing Placed:    Initial Cycle:    Initial Suction Setting (mmHg):    Pressure Injury Stages:    Pressure Injury Present Within 24 Hours of Hospital Admission:    If This Pressure Injury Is Suspected to Be Device Related, Please Select the Device::    Is the Wound Open or Closed:    Wound Assessment Dry;Scab 06/25/24 0813   Peri-wound Assessment Dry;Intact 06/25/24 0813   Wound Drainage Amount None 06/25/24 0813   Wound Dressing Status None/open to air 06/25/24 0813   Number of days: 1                    _______________________________________________________________________    Subjective   Hailey Rodgers is a 73 y.o. female.  Patient feeling better overall. Dysuria improved. Got a lot of sleep and feels more awake. Back pain tolerable. No other new symptoms        Medications  Scheduled Meds:aspirin  chewable tablet 81 mg, 81 mg, Oral, QHS  calcium  carbonate (OS-CAL) tablet 1,250 mg, 1,250 mg, Oral, BID  cefTRIAXone  (ROCEPHIN ) IVP 1 g, 1 g, Intravenous, Q24H*  celecoxib  (CeleBREX ) capsule 200 mg, 200 mg, Oral, QDAY  [START ON 06/27/2024] CHOLEcalciferoL  (vitamin D3) tablet 5,000 Units, 5,000 Units, Oral, Sundays  enoxaparin  (LOVENOX ) syringe 40 mg, 40 mg, Subcutaneous, QDAY(21)  famotidine  (PEPCID ) tablet 40 mg, 40 mg, Oral, QHS  ferrous sulfate  (FEOSOL) tablet 325 mg, 325 mg, Oral, BID  folic acid  (  FOLVITE ) tablet 4 mg, 4 mg, Oral, QDAY  insulin  aspart (U-100) (NOVOLOG  FLEXPEN U-100 INSULIN ) injection PEN 0-12 Units, 0-12 Units, Subcutaneous, ACHS (22)  insulin  aspart (U-100) (NOVOLOG  FLEXPEN U-100 INSULIN ) injection PEN 25 Units, 25 Units, Subcutaneous, TID before meals  insulin  glargine (LANTUS  SOLOSTAR U-100 INSULIN ) injection PEN 36 Units, 36 Units, Subcutaneous, BID  levothyroxine  (SYNTHROID ) tablet 88 mcg, 88 mcg, Oral, QDAY  lidocaine  (LIDODERM ) 5 % topical patch 1 patch, 1 patch, Topical, QDAY  loratadine  (CLARITIN ) tablet 10 mg, 10 mg, Oral, QDAY  mirtazapine  tablet 7.5 mg, 7.5 mg, Oral, QDAY  multivitamin (ONE-A-DAY) tablet 1 tablet, 1 tablet, Oral, QDAY(08)  pantoprazole  DR (PROTONIX ) tablet 40 mg, 40 mg, Oral, QDAY  pramipexole  (MIRAPEX ) tablet 0.75 mg, 0.75 mg, Oral, QHS  predniSONE  (DELTASONE ) tablet 5 mg, 5 mg, Oral, QDAY  pregabalin  (LYRICA ) capsule 25 mg, 25 mg, Oral, Q8H  rosuvastatin  (CRESTOR ) tablet 20 mg, 20 mg, Oral, QDAY  venlafaxine  XR (EFFEXOR  XR) capsule 225 mg, 225 mg, Oral, QDAY    Continuous Infusions:  PRN and Respiratory Meds:acetaminophen  Q4H PRN, albuterol  sulfate Q4H PRN, bisacodyL  QDAY PRN, dextrose  50% (D50) IV PRN, diclofenac  sodium QID PRN, fluticasone  propionate BID PRN, hydrocortisone  acetate BID PRN, loperamide  PRN, melatonin QHS PRN, methocarbamoL  BID PRN, nitroglycerin  Q5 MIN PRN, oxyCODONE  Q8H PRN, polyethylene glycol 3350  BID PRN        Objective                        Vital Signs: Last Filed                 Vital Signs: 24 Hour Range   BP: 121/62 (07/04 1206)  Temp: 37.1 ?C (98.7 ?F) (07/04 1206)  Pulse: 64 (07/04 1206)  Respirations: 20 PER MINUTE (07/04 1206)  SpO2: 96 % (07/04 1206)  O2%: 21 % (07/04 0114)  O2 Device: None (Room air) (07/04 1206)  O2 Liter Flow: 1 Lpm (07/04 0300) BP: (96-128)/(54-83)   Temp:  [36.5 ?C (97.7 ?F)-37.1 ?C (98.7 ?F)]   Pulse:  [64-92]   Respirations:  [18 PER MINUTE-20 PER MINUTE]   SpO2:  [96 %-99 %]   O2%:  [21 %]   O2 Device: None (Room air)  O2 Liter Flow: 1 Lpm   Intensity Pain Scale (Self Report): 2 (06/25/24 0813)  PAINAD Total Score: 0 (06/24/24 1509) Vitals:    06/23/24 2051 06/24/24 0615 06/25/24 0626   Weight: 77 kg (169 lb 12.1 oz) 77 kg (169 lb 12.1 oz) 82.2 kg (181 lb 3.5 oz)         Intake/Output Summary:  (Last 24 hours)    Intake/Output Summary (Last 24 hours) at 06/25/2024 1342  Last data filed at 06/25/2024 1050  Gross per 24 hour   Intake 2327.45 ml   Output 400 ml   Net 1927.45 ml     Stool Occurrence: 0

## 2024-06-26 LAB — POC GLUCOSE
~~LOC~~ BKR POC GLUCOSE: 123 mg/dL — ABNORMAL HIGH (ref 70–100)
~~LOC~~ BKR POC GLUCOSE: 129 mg/dL — ABNORMAL HIGH (ref 70–100)
~~LOC~~ BKR POC GLUCOSE: 40 mg/dL — CL (ref 70–100)
~~LOC~~ BKR POC GLUCOSE: 40 mg/dL — CL (ref 70–100)
~~LOC~~ BKR POC GLUCOSE: 48 mg/dL — CL (ref 70–100)
~~LOC~~ BKR POC GLUCOSE: 50 mg/dL — ABNORMAL LOW (ref 70–100)
~~LOC~~ BKR POC GLUCOSE: 53 mg/dL — ABNORMAL LOW (ref 70–100)
~~LOC~~ BKR POC GLUCOSE: 77 mg/dL (ref 70–100)
~~LOC~~ BKR POC GLUCOSE: 78 mg/dL (ref 70–100)

## 2024-06-26 LAB — COMPREHENSIVE METABOLIC PANEL
~~LOC~~ BKR CALCIUM: 9.1 mg/dL — ABNORMAL LOW (ref 8.5–10.6)
~~LOC~~ BKR CREATININE: 0.5 mg/dL — ABNORMAL HIGH (ref 0.40–1.00)
~~LOC~~ BKR TOTAL BILIRUBIN: 0.2 mg/dL (ref 0.2–1.3)
~~LOC~~ BKR TOTAL PROTEIN: 6.4 g/dL (ref 6.0–8.0)

## 2024-06-26 LAB — CULTURE-URINE W/SENSITIVITY: ~~LOC~~ BKR URINE CULTURE: >10 — AB

## 2024-06-26 MED ORDER — INSULIN ASPART 100 UNIT/ML SC FLEXPEN
22 [IU] | Freq: Three times a day (TID) | SUBCUTANEOUS | 0 refills | Status: DC
Start: 2024-06-26 — End: 2024-06-27

## 2024-06-26 MED ORDER — POLYETHYLENE GLYCOL 3350 17 GRAM PO PWPK
17 g | Freq: Every day | ORAL | 0 refills | Status: DC
Start: 2024-06-26 — End: 2024-07-01
  Administered 2024-06-26 – 2024-06-30 (×5): 17 g via ORAL

## 2024-06-26 MED ORDER — MIRTAZAPINE 15 MG PO TAB
15 mg | Freq: Every day | ORAL | 0 refills | Status: DC
Start: 2024-06-26 — End: 2024-07-01
  Administered 2024-06-27 – 2024-07-01 (×5): 15 mg via ORAL

## 2024-06-26 MED ORDER — INSULIN GLARGINE 100 UNIT/ML (3 ML) SC INJ PEN
34 [IU] | Freq: Two times a day (BID) | SUBCUTANEOUS | 0 refills | Status: DC
Start: 2024-06-26 — End: 2024-06-27

## 2024-06-26 MED ORDER — ARTIFICIAL TEARS (PF) SINGLE DOSE DROPS GROUP
1 [drp] | OPHTHALMIC | 0 refills | Status: DC | PRN
Start: 2024-06-26 — End: 2024-07-01
  Administered 2024-06-26 – 2024-06-28 (×2): 1 [drp] via OPHTHALMIC
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
## 2024-06-26 NOTE — Consults
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 moves all extremities spontaneously    Lab/Radiology/Other Diagnostic Tests:  Pertinent labs reviewed

## 2024-06-26 NOTE — Progress Notes
 PHYSICAL THERAPY  ASSESSMENT      Name: Hailey Rodgers   MRN: [id number]     DOB: Dec 04, 1951      Age: 73 y.o.  Admission Date: 06/23/2024     LOS: 2 days     Date of Service: 06/26/2024      Mobility  Patient Turn/Position: Supine  Mobility Level [facility] Highest Level of Mobility (JH-HLM): Walk 25 feet or more (to doorway/hallway)  Distance Walked (feet): 120 ft  Level of Assistance: Assist X1  Assistive Device: Walker  Activity Limited By: Ulice    Subjective  Significant Hospital Events: 73 y.o.   female with rheumatoid arthritis on chronic pre,  OSA on BIPAP, chronic heart failure with preserved EF,  CAD , HTN, dyslipidemia, IDDM type 2, history of urinary retentions ,  history of b  T12-iliac decompression and fusion ~ 01/2024  presented to [facility] on 06/23/24 for fever along with  acute on chronic back pain , diarrhea, foul urine/incontinence  Mental / Cognitive: Alert;Oriented;Cooperative  Pain: Complains of pain  Pain level: After activity;10/10  Pain Location: Back;Bilateral;Hip  Pain Description: Aching  Pain Interventions: Patient agrees to participate in therapy with current pain level;Nursing staff notified of patient's pain level;Treatment altered to patient's pain tolerance    Home Living Situation  Lives With: Spouse/significant other  Comments: Per patient, spouse not in good health after having a brain bleed, about 3 years ago. He is frequently incontinent and at times hallucinates.  Type of Home: Mobile home  Entry Stairs: Ramp  In-Home Stairs: No stairs  Patient Owned Equipment: Cane: Single point;Walker: Rollator;Hospital bed;Commode: Standard (Lift chair)    Prior Level of Function  Level Of Independence: Independent with ADL and household mobility with device  Comments: Reports hallways too narrow to use the rollator at home. Uses cane in the house  History of Falls in Past 3 Months: Yes    Precautions  Precautions: Back Safety (For comfort)    ROM  ROM Position Assessed: Seated  R LE ROM: WFL  R LE ROM Method: Active  L LE ROM: WFL  L LE ROM Method: Active    Strength  Strength Position Assessed: Seated  Overall Strength: Generalized weakness    Sensation/Tone/Coordination  Head Control: Independent  Posture: Kyphosis;Forward head  Overall Tone: Normal;RLE;LLE    Bed Mobility/Transfer  Bed Mobility: Supine to Sit: Minimal Assist;Head of Bed Elevated;Verbal Cues;Assist with Trunk  Bed Mobility: Sit to Supine: Minimal Assist;Bed Flat;Verbal Cues;Assist with B LE  Transfer Type: Sit to Stand  Transfer: Assistance Level: From;Bed;Minimal Assist  Transfer: Assistive Device: Nurse, adult  Transfers: Type Of Assistance: For Safety Considerations;For Balance;For Strength Deficit;Verbal Cues  Other Transfer Type: Sit to/from Stand  Other Transfer: Assistance Level: To/From;Toilet;Minimal Assist  Other Transfer: Assistive Device: Nurse, adult  Other Transfer: Type Of Assistance: For Safety Considerations;For Strength Deficit;For Balance;Verbal Cues  End Of Activity Status: In Bed;Nursing Notified;Instructed Patient to Request Assist with Mobility;Instructed Patient to Use Call Light    Balance  Sitting Balance: Static Sitting Balance;Dynamic Sitting Balance;2 UE Support;Standby Assist;Uneven Surface  Standing Balance: Static Standing Balance;Dynamic Standing Balance;2 UE support;Minimal Assist;Even Surface    Gait  Gait Distance: 120 feet  Gait: Assistance Level: Minimal Assist;Safety Considerations  Gait: Assistive Device: Roller Walker  Gait: Descriptors: Decreased heel strike LLE;Pace: Slow;No balance loss  Activity Limited By: Weakness;Complaint of Fatigue;Complaint of Pain    Activity/Exercise  Comments: PT educated patient on transverse abdominis activation, as well as working  on gluteal squeezes and lumbar rotations in bed to promote core strengthening and lower body mobility. Patient agreeable to performing these tasks throughout the day.    Education  Persons Educated: Patient  Patient Barriers To Learning: None Noted  Interventions: Repetition of Instructions;Demonstration Provided  Teaching Methods: Verbal Instruction;Demonstration  Patient Response: Verbalized Understanding;Return Demonstration;More Instruction Required  Topics: Plan/Goals of PT Interventions;Use of Assistive Device/Orthosis;Mobility Progression;Safety Awareness;Up with Assist Only;Importance of Increasing Activity;Recommend Continued Therapy    Assessment/Progress  Impaired Mobility Due To: Pain;Decreased Strength;Impaired Balance;Decreased Activity Tolerance;Safety Concerns  Assessment/Progress: Should Improve w/ Continued PT    AM-PAC 6 Clicks Basic Mobility Inpatient  Turning from your back to your side while in a flat bed without using bed rails: None  Moving from lying on your back to sitting on the side of a flat bed without using bedrails : A Little  Moving to and from a bed to a chair (including a wheelchair): A Little  Standing up from a chair using your arms (e.g. wheelchair, or bedside chair): A Little  To walk in hospital room: A Little  Climbing 3-5 steps with a railing: A Lot  Basic Mobility Inpatient Raw Score: 18  Standardized (T-scale) Score: 41.05  AM-PAC Basic Mobility Functional Stage: 34-51 Limited Mobility Indoors  Mobility Goal [facility]: JH-HLM 6 Walk >= 10 steps    Goals  Goal Formulation: With Patient  Time For Goal Achievement: 3 days, To, 7 days  Patient Will Go Supine To/From Sit: w/ Stand By Assist  Patient Will Logroll: w/ Stand By Assist  Patient Will Transfer Bed/Chair: w/ Stand By Assist  Patient Will Transfer Sit to Stand: w/ Stand By Assist  Patient Will Ambulate: Greater than 200 Feet, w/ Vannie, w/ Stand By Assist    Plan  Treatment Interventions: Mobility training  Plan Frequency: 3-5 Days per Week  PT Plan for Next Visit: Ongoing core strengthening and emphasis on optimizing upright posture. Progress gait distance/quality with ongoing use of the roller walker.    PT Discharge Recommendations  Recommendation: Inpatient setting  Patient Currently Requires Physical Assist With: All mobility      Therapist  Donnice Morel, PT, DPT [phone]  Date  06/26/2024

## 2024-06-26 NOTE — Progress Notes
 1753: Notified Dr. Graig Henri of patient BG of 48 prior to dinner. Patient was given 8oz of apple juice. Will recheck blood sugar in 15 mins. Held sliding scale given to order parameters not met. Requesting to hold meal time 22 units of Novolog .     1810: Dr. Graig Henri ackowledged. Ok to hold Novolog  for now until Dr. Henri can review. Stated to let patient eat and document the amount she ate. Asked if Type 1 or Type 2 diabetic.    1811: Notified Dr. Graig Henri of 15 minute blood glucose recheck. BG of 15. Gave patient an additonal 8 oz of apple juice. Will rechedk BG again in 15 mins.    1822: Dr. Graig Henri acknowledged and said definetly not to give any meal time insulin  and that she will readjust insulins later. Asked to ask patient what she takes at home and if patient has been completing her meals?    1900: Notified Dr. Graig Henri that patient's 15 min blood sugar recheck was 53. 25 mL of D50 given. Just now rechecked patient's BG after waiting 15 mins after D50 administration and BG now of 129. Patient ate 75% of grilled chicken sandwich, 50% of baked lays potato chips, and 100% of vanilla ice cream with two packets of graham crackers. Also notified not sure about patient's regular insulin  regimen. Patient did report that she did not eat much of her lunch today.     1905: Dr. Graig Henri acknowledged. Continue to monitor. No new orders at this time. Dr. Henri will make changes to patient's regimen.

## 2024-06-27 LAB — POC GLUCOSE
~~LOC~~ BKR POC GLUCOSE: 195 mg/dL — ABNORMAL HIGH (ref 70–100)
~~LOC~~ BKR POC GLUCOSE: 137 mg/dL — ABNORMAL HIGH (ref 70–100)
~~LOC~~ BKR POC GLUCOSE: 86 mg/dL (ref 70–100)
~~LOC~~ BKR POC GLUCOSE: 93 mg/dL (ref 70–100)

## 2024-06-27 LAB — CULTURE-FECES W/SENSITIVITY

## 2024-06-27 MED ORDER — INSULIN GLARGINE 100 UNIT/ML (3 ML) SC INJ PEN
25 [IU] | Freq: Two times a day (BID) | SUBCUTANEOUS | 0 refills | Status: DC
Start: 2024-06-27 — End: 2024-06-27

## 2024-06-27 MED ORDER — INSULIN GLARGINE 100 UNIT/ML (3 ML) SC INJ PEN
25 [IU] | Freq: Two times a day (BID) | SUBCUTANEOUS | 0 refills | Status: CN
Start: 2024-06-27 — End: ?

## 2024-06-27 MED ORDER — INSULIN GLARGINE 100 UNIT/ML (3 ML) SC INJ PEN
20 [IU] | Freq: Two times a day (BID) | SUBCUTANEOUS | 0 refills | Status: DC
Start: 2024-06-27 — End: 2024-06-28

## 2024-06-27 MED ORDER — MAGNESIUM HYDROXIDE 400 MG/5 ML PO SUSP
30 mL | Freq: Once | ORAL | 0 refills | Status: CP
Start: 2024-06-27 — End: ?
  Administered 2024-06-27: 15:00:00 30 mL via ORAL

## 2024-06-27 MED ORDER — INSULIN ASPART 100 UNIT/ML SC FLEXPEN
18 [IU] | Freq: Three times a day (TID) | SUBCUTANEOUS | 0 refills | Status: DC
Start: 2024-06-27 — End: 2024-06-27

## 2024-06-27 MED ORDER — SODIUM CHLORIDE 0.65 % NA SPRA
2 | NASAL | 0 refills | Status: DC | PRN
Start: 2024-06-27 — End: 2024-07-01

## 2024-06-27 MED ORDER — INSULIN ASPART 100 UNIT/ML SC FLEXPEN
15 [IU] | Freq: Three times a day (TID) | SUBCUTANEOUS | 0 refills | Status: CN
Start: 2024-06-27 — End: ?

## 2024-06-27 MED ORDER — INSULIN ASPART 100 UNIT/ML SC FLEXPEN
15 [IU] | Freq: Three times a day (TID) | SUBCUTANEOUS | 0 refills | Status: DC
Start: 2024-06-27 — End: 2024-07-01

## 2024-06-27 MED ORDER — INSULIN GLARGINE 100 UNIT/ML (3 ML) SC INJ PEN
30 [IU] | Freq: Two times a day (BID) | SUBCUTANEOUS | 0 refills | Status: DC
Start: 2024-06-27 — End: 2024-06-27

## 2024-06-27 NOTE — Progress Notes
 RT Adult Assessment Note    NAME:Zalma KHRISTINE VERNO             MRN: [id number]             DOB:[birth year]          AGE: 73 y.o.  ADMISSION DATE: 06/23/2024             DAYS ADMITTED: LOS: 3 days    Additional Comments:  Impressions of the patient: No signs of respiratory distress at this time, resting comfortably on room air. Reports feeling as though she has phlegm in the back of her throat that is difficult to cough out. Given vibrapep tool for this reason.   Intervention(s)/outcome(s): RT re-evaluation and assessment, vibrapep therapy.   Patient education that was completed: Vibrapep indication and technique   Recommendations to the care team: Encourage to use vibrapep as needed; continuous pulse oximetry per OSA protocol.     Vital Signs:  Pulse: 58  RR: 18 PER MINUTE  SpO2: 97 %  O2 Device: None (Room air)  Liter Flow:    O2%:      Breath Sounds:   All Breath Sounds: Clear (Implies normal)  Respiratory Effort:   Respiratory Effort/Pattern: Unlabored  Comments:

## 2024-06-27 NOTE — Progress Notes
 General Progress Note    Name: RONESHA HEENAN        MRN: [id number]          DOB: [birth year]            Age: 73 y.o.  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   female with rheumatoid arthritis on chronic pre,  OSA on BIPAP, chronic heart failure with preserved EF,  CAD , HTN, dyslipidemia, IDDM type 2, history of urinary retentions ,  history of b  T12-iliac decompression and fusion ~ 01/2024  presented to [place] on 06/23/24 for fever along with  acute on chronic back pain , diarrhea, foul urine/incontinence    Fever secondary to UTI due to E coli  History of urinary retention s/p multiple foley in the past  - UA:p  positive nitrite, 3+ leukocytes, packed WBCs            - 2-3 days dysuria and fever PTA  Plan:  > Obtain bladder scan q8h x 3 post voice, F/u on blood cultures, urine culutres   > Continue ceftriaxone  ( 7/3 - present), last dose 7/7  - Due to recurrent urinary retention - will hold off gemtesa .    Fluid and gas within T12 vertebral body, lucency and T11. Hx of T12 iliac decompression/fusion on 01/2024  Acute on chronic back pain   - CT ABD/PELV W CONTRAST7/02/2024  1.  Prior laminectomies and posterior instrumented fusion from T12 through the sacrum with superior endplate compression deformities of T12 and L2 with mild vertebral body height loss, previously described on outside imaging 05/12/2024 (this imaging is not available for review). 2.  Fluid and gas within the T12 vertebral body with sclerosis throughout the vertebral body and partially marginated scalloped lucency of the adjacent inferior endplate of T11. These findings may be entirely related to a combination of postsurgical changes, recent fracture, and periprosthetic loosening/mechanical factors. However, superimposed discitis/osteomyelitis is a diagnostic consideration. Dedicated MRI of the thoracic and lumbar spine with contrast is recommended for further evaluation in the absence of recent comparative films to determine stability. 3.  Circumferential thickening of the urinary bladder wall consistent with cystitis given urinalysis results. F  - OSH 05/12/24 MRI T spine: Numbering scheme is based on the lumbar spine with the   wedging deformity and signal abnormalities at T12 noted as discussed in  the CT and MR lumbar spine.  There is some mild to moderate diffuse thoracic spondylosis with no  additional compression deformity seen. Moderate posterior ridging and  spurring is noted with mild to  moderate canal narrowing as discussed above. No intrinsic cord signal abnormalities or additional enhancing abnormalities are noted.   - osh 05/12/24 MRI L spine: moderate periimplant lucency surrounding the bilateral T12 pedicle   screws demonstrated on recent CT is associated with diffuse marrow edema throughout the entirety of the vertebral body, anterior superior  endplate fracture, and fluid-filled fracture cleft. The marrow signal  abnormality could be secondary to mechanical loosening and fracture.   - MRI spine : 1.  Long segment spinal posterior instrumentation spanning T12 through the   sacroiliac joints. Periprosthetic fracture of T12 with pedicle screw   loosening more completely assessed on same-day CT abdomen and pelvis.   2.  Scalloped inferior endplate height loss of T11 with mild enhancing   marrow edema of the T11 vertebral body and fluid within the T12 fracture   plane likely represents benign edema secondary to compression injuries,   junctional mechanical factors, and reactive osteitis. Superinfection is   less likely, particularly given absent secondary paraspinous or epidural   inflammatory findings.   Plan  > Continue PTA oxycodone  PRN , lyrica  100 mg TID, robaxin  , tylenol  PRN, lidocaine  patch  - PT / OT         Diarrhea(non bloody)- obtain C-diff PCR, stool culture, giardia , crypto - negative   - Diarrhea improving 7/4, CTM. Now she feels constipated. Can use PEG BID PRN, senna PRN      HLD  Chronic heart failure with preserved EF  OSA on BIPAP  - No oxygen at baseline                > Continue PTA aspirin                 > Continue PTA Crestor                 > Continue to hold bumex  and spirolactone for now. Daughter reports patient's bumex  and spirolactone has been HOLD since 02/2024 hospitalization. 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 This is restarted 3 weeks PTA with last dose on Sunday  06/20/24, likely can skip 1 week due to infection then resume      Hypothyroidism -continue PTA synthroid   GERD - continue PTA PPI    Suicidal ideation  - reports chronic SI, significant past trauma, chronic pain. Adamant that she isn't actually going to do it and says things like that sometimes. Reportedly had a near attempt ~ 6 months prior, was going to take pills, but when her daughter said she was going to report her SI, she threw the pills at daughter. [place] access [place] medications [place] means [place] and says the she could have if she really wanted to but isnt going to  , psych consulted         FEN: no IVF, electrolytes reviewed, No diet orders on file   VTE Prophalyxis: lovenox   Disposition: continued admisison   Code status: Full Code  (Discussed on 06/24/2024)      Total Time Today was >47 minutes in the following activities: Preparing to see the patient, Obtaining and/or reviewing separately obtained history, Performing a medically appropriate examination and/or evaluation, Counseling and educating the patient/family/caregiver, Ordering medications, tests, or procedures, Referring and communication with other health care professionals (when not separately reported), Documenting clinical information in the electronic or other health record, and Independently interpreting results (not separately reported) and communicating results to the patient/family/caregiver                 Wound:      Wounds Skin tear Anterior;Left Knee (Active)   06/24/24 0620   Wound Type: Skin tear   Orientation: Anterior;Left   Location: Knee   Wound Location Comments: scab   Initial Wound Site Closure:    Initial Dressing Placed:    Initial Cycle:    Initial Suction Setting (mmHg):    Pressure Injury Stages:    Pressure Injury Present Within 24 Hours of Hospital Admission:    If This Pressure Injury Is Suspected to Be Device Related, Please Select the Device::    Is the Wound Open or Closed:    Wound Assessment Dressing not removed for assessment 06/27/24 0845   Peri-wound Assessment Dry;Intact 06/27/24 0845   Wound Drainage Amount None 06/27/24 0845   Wound Dressing Status Intact 06/27/24 0845   Wound Care Dressing changed or new application 06/26/24 2100   Wound Dressing and/or Treatment A & D ointment;Primapore 06/26/24 2100   Number of days: 3                    _______________________________________________________________________    Subjective   ISAMAR NAZIR is a 73 y.o. female.  Physically improving. Dysuria resolved. Feels like she has some muffled hearing in left ear (exam normal). No other new symptoms or concerns        Medications  Scheduled Meds:aspirin  chewable tablet 81 mg, 81 mg, Oral, QHS  calcium  carbonate (OS-CAL) tablet 1,250 mg, 1,250 mg, Oral, BID  cefTRIAXone  (ROCEPHIN ) IVP 1 g, 1 g, Intravenous, Q24H*  celecoxib  (CeleBREX ) capsule 200 mg, 200 mg, Oral, QDAY  CHOLEcalciferoL  (vitamin D3) tablet 5,000 Units, 5,000 Units, Oral, Sundays  enoxaparin  (LOVENOX ) syringe 40 mg, 40 mg, Subcutaneous, QDAY(21)  famotidine  (PEPCID ) tablet 40 mg, 40 mg, Oral, QHS  ferrous sulfate  (FEOSOL) tablet 325 mg, 325 mg, Oral, BID  folic acid  (FOLVITE ) tablet 4 mg, 4 mg, Oral, QDAY  insulin   aspart (U-100) (NOVOLOG  FLEXPEN U-100 INSULIN ) injection PEN 0-12 Units, 0-12 Units, Subcutaneous, ACHS (22)  insulin  aspart (U-100) (NOVOLOG  FLEXPEN U-100 INSULIN ) injection PEN 15 Units, 15 Units, Subcutaneous, TID before meals  insulin  glargine (LANTUS  SOLOSTAR U-100 INSULIN ) injection PEN 20 Units, 20 Units, Subcutaneous, BID  levothyroxine  (SYNTHROID ) tablet 88 mcg, 88 mcg, Oral, QDAY  lidocaine  (LIDODERM ) 5 % topical patch 1 patch, 1 patch, Topical, QDAY  loratadine  (CLARITIN ) tablet 10 mg, 10 mg, Oral, QDAY  mirtazapine  (REMERON ) tablet 15 mg, 15 mg, Oral, QDAY  multivitamin (ONE-A-DAY) tablet 1 tablet, 1 tablet, Oral, QDAY(08)  pantoprazole  DR (PROTONIX ) tablet 40 mg, 40 mg, Oral, QDAY  polyethylene glycol 3350  (MIRALAX ) packet 17 g, 17 g, Oral, QDAY  pramipexole  (MIRAPEX ) tablet 0.75 mg, 0.75 mg, Oral, QHS  predniSONE  (DELTASONE ) tablet 5 mg, 5 mg, Oral, QDAY  pregabalin  (LYRICA ) capsule 25 mg, 25 mg, Oral, Q8H  rosuvastatin  (CRESTOR ) tablet 20 mg, 20 mg, Oral, QDAY  venlafaxine  XR (EFFEXOR  XR) capsule 225 mg, 225 mg, Oral, QDAY    Continuous Infusions:  PRN and Respiratory Meds:acetaminophen  Q4H PRN, albuterol  sulfate Q4H PRN, artificial tears (PF) single dose PRN, bisacodyL  QDAY PRN, dextrose  50% (D50) IV PRN, diclofenac  sodium QID PRN, fluticasone  propionate BID PRN, hydrocortisone  acetate BID PRN, loperamide  PRN, melatonin QHS PRN, methocarbamoL  BID PRN, nitroglycerin  Q5 MIN PRN, oxyCODONE  Q8H PRN, sodium chloride  PRN        Objective                        Vital Signs: Last Filed                 Vital Signs: 24 Hour Range   BP: 137/74 (07/06 1136)  Temp: 36.6 ?C (97.8 ?F) (07/06 1136)  Pulse: 64 (07/06 1136)  Respirations: 16 PER MINUTE (07/06 1136)  SpO2: 94 % (07/06 1136)  O2 Device: None (Room air) (07/06 1136)  O2 Liter Flow: 1 Lpm (07/06 0352) BP: (113-137)/(56-74)   Temp:  [36.4 ?C (97.5 ?F)-36.9 ?C (98.4 ?F)]   Pulse:  [56-69]   Respirations:  [16 PER MINUTE-20 PER MINUTE]   SpO2:  [94 %-98 %]   O2 Device: None (Room air)  O2 Liter Flow: 1 Lpm   Intensity Pain Scale (Self Report): 2 (06/27/24 0845) Vitals:    06/25/24 0626 06/26/24 0554 06/27/24 0354   Weight: 82.2 kg (181 lb 3.5 oz) 82.5 kg (181 lb 14.1 oz) 83.1 kg (183 lb 3.2 oz)         Intake/Output Summary:  (Last 24 hours)    Intake/Output Summary (Last 24 hours) at 06/27/2024 1156  Last data filed at 06/27/2024 1050  Gross per 24 hour   Intake 2160 ml   Output 2950 ml   Net -790 ml     Stool Occurrence: 0

## 2024-06-27 NOTE — Progress Notes
 OCCUPATIONAL THERAPY  ASSESSMENT NOTE    Name: Hailey Rodgers   MRN: [id number]     DOB: 1951/11/03      Age: 73 y.o.  Admission Date: 06/23/2024     LOS: 3 days     Date of Service: 06/27/2024    Mobility  Patient Turn/Position: Chair  Mobility Level [facility] Highest Level of Mobility (JH-HLM): Walk 25 feet or more (to doorway/hallway)  Distance Walked (feet): 150 ft  Level of Assistance: Assist X1  Assistive Device: Walker  Activity Limited By: Fatigue;Weakness;Pain    Subjective  Significant Hospital Events: 73 y.o.   female with rheumatoid arthritis on chronic pre,  OSA on BIPAP, chronic heart failure with preserved EF,  CAD , HTN, dyslipidemia, IDDM type 2, history of urinary retentions ,  history of b  T12-iliac decompression and fusion ~ 01/2024  presented to  ED on 06/23/24 for fever along with  acute on chronic back pain , diarrhea, foul urine/incontinence  Mental / Cognitive: Alert;Oriented;Cooperative  Pain: Complains of pain;Does not rate pain  Pain level: During activity  Pain Location: Back  Pain Interventions: Patient agrees to participate in therapy with current pain level;Treatment altered to patient's pain tolerance  Persons Present: Nursing Staff  Comments: RN present at end of session. Patient up in chair with alarm on and all needs met.    Home Living Situation  Lives With: Spouse/significant other  Comments: Per patient, spouse not in good health after having a brain bleed, about 3 years ago. He is frequently incontinent and at times hallucinates.  Type of Home: Mobile home  Entry Stairs: Ramp  In-Home Stairs: No stairs  Bathroom Setup: Not accessible  Patient Owned Equipment: Cane: Single point;Walker: Rollator;Hospital bed;Commode: Standard (Lift chair)  Comments: Patient says she is unable to use walker inside her trailer due to clutter so she uses a cane    Prior Level of Function  Level Of Independence: Independent with ADL and household mobility with device  Comments: Reports hallways too narrow to use the rollator at home. Uses cane in the house  History of Falls in Past 3 Months: Yes  Comments: Lost balance while using cane.    Precautions  Precautions: Back Safety (For comfort)    ADL's  Where Assessed: Standing at Sink;Edge of Bed;In [employer] Assist: Stand By Assist  Grooming Deficits: Wash/Dry Hands;Wash/Dry Face  LE Dressing Assist: Total Assist  LE Dressing Deficits: Don/Doff R Sock;Don/Doff L Sock  Toileting Assist: Minimal Assist  Toileting Deficits: Clothing Management Up;Clothing Management Down  Comment: Patient atttempted donning socks at EOB in figure 4 position but unable to due to pain.    ADL Mobility  Bed Mobility: Supine to Sit: Minimal assist  Bed Mobility Comments: HOB elevated, use of hand rail  Transfer Type: Sit to/from stand  Transfer: Assistance Level: To/from;Bed;Minimal assist  Transfer: Assistive Device: Roller walker  Transfer: Type of Assistance: For balance;For safety considerations;For strength deficit  Other Transfer Type: Sit to/from stand  Other Transfer: Assistance Level: To/from;Toilet;Minimal assist  Other Transfer: Assistive Device:  (grab bar)  Other Transfer: Type of Assistance: For balance;For safety considerations;For strength deficit  End of Activity Status: Up in chair;Instructed patient to request assist with mobility;Instructed patient to use call light;Nursing notified  Transfer Comments: Unable to complete 5x STS due to no appropriate chair to do it from (chair in room doesn't sit upright at 90 degrees), patient declines from EOB.  Standing Balance: Dynamic standing balance;No UE support;Minimal  assist  Gait: Assistance Level: Minimal assist (CGA)  Gait: Assistive Device: Roller walker  Gait Comments: Patient walked in hallway with CGA for balance and safety. Increased time required.    Activity Tolerance  Endurance: 2/5 Tolerates 10-20 Minutes Exercise w/Multiple Rests  Comment: Limited by weakness and fatigue    Cognition  Overall Cognitive Status: WFL to Adequately Complete Self Care Tasks Safely  Comprehension: WFL to Adequately Complete Self Care Tasks Safely  Orientation: Alert & Oriented x4  Attention: Awake/Alert    ROM  R UE ROM: WFL  L UE ROM: WFL  Grasp: Bilateral Grasp Functional for Activity    Strength / Tone  R UE Strength: WFL  L UE Strength: WFL  Strength Comments: proximal weakness    Education  Persons Educated: Patient  Barriers To Learning: None Noted  Interventions: Repetition of Instructions  Teaching Methods: Verbal Instruction  Patient Response: Verbalized Understanding  Topics: Role of OT, Goals for Therapy  Goal Formulation: With Patient    Assessment  Assessment: Decreased Endurance;Decreased Self-Care Trans;Decreased High-Level ADLs  Goal Formulation: Patient  Comments: Patient currently limited by weakness, balance, and endurance. Patient has had recent falls and is still experiencing pain/weakness from back surgery 01/2024. Will benefit from continued OT services to progress ADLs and functional mobility.    AM-PAC 6 Clicks Daily Activity Inpatient  Putting on and taking off regular lower body clothes: Total  Bathing (Including washing, rinsing, drying): A Lot  Toileting, which includes using toilet, bedpan, or urinal: A Little  Putting on and taking off regular upper body clothing: A Little  Taking care of personal grooming such as brushing teeth: None  Eating meals: None  Daily Activity Raw Score: 17  Standardized (T-scale) Score: 37.26    Plan  OT Frequency: 2-3x/week  OT Plan for Next Visit: LB dressing, bed mobility, sit to stands, outcome measure, functional mobility, toilet transfer    ADL Goals  Patient Will Perform All ADL's: w/ Stand By Assist    Functional Transfer Goals  Pt Will Perform All Functional Transfers: w/ Stand By Assist    OT Discharge Recommendations  Recommendation: Inpatient setting  Patient Currently Requires Physical Assist With: All mobility;All personal care ADLs;All home functioning ADLs    Therapist: Vernell Arla MEMO, OTR/L [id number]  Date: 06/27/2024

## 2024-06-28 ENCOUNTER — Encounter: Admit: 2024-06-28 | Discharge: 2024-06-28 | Payer: MEDICARE

## 2024-06-28 LAB — POC GLUCOSE
~~LOC~~ BKR POC GLUCOSE: 179 mg/dL — ABNORMAL HIGH (ref 70–100)
~~LOC~~ BKR POC GLUCOSE: 117 mg/dL — ABNORMAL HIGH (ref 70–100)
~~LOC~~ BKR POC GLUCOSE: 136 mg/dL — ABNORMAL HIGH (ref 70–100)
~~LOC~~ BKR POC GLUCOSE: 137 mg/dL — ABNORMAL HIGH (ref 70–100)
~~LOC~~ BKR POC GLUCOSE: 71 mg/dL (ref 70–100)

## 2024-06-28 MED ORDER — INSULIN GLARGINE 100 UNIT/ML (3 ML) SC INJ PEN
15 [IU] | Freq: Two times a day (BID) | SUBCUTANEOUS | 0 refills | Status: DC
Start: 2024-06-28 — End: 2024-07-01

## 2024-06-28 MED ORDER — OXYCODONE 5 MG PO TAB
5 mg | ORAL | 0 refills | Status: DC | PRN
Start: 2024-06-28 — End: 2024-07-01
  Administered 2024-06-29: 02:00:00 5 mg via ORAL

## 2024-06-28 MED ORDER — METHOCARBAMOL 750 MG PO TAB
750 mg | Freq: Three times a day (TID) | ORAL | 0 refills | Status: DC | PRN
Start: 2024-06-28 — End: 2024-06-30
  Administered 2024-06-28 – 2024-06-29 (×3): 750 mg via ORAL

## 2024-06-28 MED ORDER — PREGABALIN 50 MG PO CAP
50 mg | ORAL | 0 refills | Status: DC
Start: 2024-06-28 — End: 2024-06-30
  Administered 2024-06-28 – 2024-06-30 (×5): 50 mg via ORAL

## 2024-06-28 MED ORDER — INSULIN GLARGINE 100 UNIT/ML (3 ML) SC INJ PEN
18 [IU] | Freq: Two times a day (BID) | SUBCUTANEOUS | 0 refills | Status: DC
Start: 2024-06-28 — End: 2024-06-28

## 2024-06-28 MED ORDER — FERROUS SULFATE 325 MG (65 MG IRON) PO TAB
325 mg | ORAL | 0 refills | Status: DC
Start: 2024-06-28 — End: 2024-07-01
  Administered 2024-06-30: 13:00:00 325 mg via ORAL

## 2024-06-28 NOTE — Case Management (ED)
 Case Management Progress Note    NAME:Hailey Rodgers                          MRN: [id number]              DOB:[birth year]          AGE: 73 y.o.  ADMISSION DATE: 06/23/2024             DAYS ADMITTED: LOS: 4 days      Today's Date: 06/28/2024    PLAN: Discharge to IPR pending facility acceptance.     Expected Discharge Date: 06/28/2024   Is Patient Medically Stable: Yes   Are there Barriers to Discharge? Yes Administrator, Civil Service)    INTERVENTION/DISPOSITION:  Discharge Planning              Discharge Planning: Inpatient Rehabilitation  SW reviewed EMR & participated in MPJ huddle.   PT/OT recommending inpatient setting.  SW met with pt at bedside to discuss d/c plan & preference. Daughter [name] participated in the conversation via phone. Pt is agreeable to placement and prefers IPR. Pt has been to several IPR's in the past. SW, pt and [name] discussed facility options. They requested referrals be sent to the following:  [facility] - Notified admissions team of referral  [facility] - Spoke with Silvano [phone]), she is reviewing & will call SW back  [facility] - Communicated with Ronal, she wants to see outcome of PM&R consult before making a decision  SW asked attending MD to order PM&R consult.   SW will continue to follow.   Transportation              Does the Patient Need Case Management to Arrange Discharge Transport? (ex: facility, ambulance, wheelchair/stretcher, Medicaid, cab, other): Yes  Type of Transport: Wheelchair fleeta  Will the Patient Use Family Transport?: No  Transportation Name, Phone and Availability #1: daughter [name]  Support              Support: Pt/Family Updates re:POC or DC Plan, Huddle/team update  Info or Referral                 Positive SDOH Domains and Potential Barriers     Medication Needs    Financial                 Legal                 Other                 Discharge Disposition    Selected Continued Care - Admitted Since 06/23/2024       [facility] Coordination complete.      Service Provider Services Address Phone Fax Patient Preferred    [facility] [address], [place] [address] [phone] --                  Eleanor Copa, LMSW  Available on Voalte  Work Cell: [phone]

## 2024-06-28 NOTE — Progress Notes
 Psychiatric Consultation Progress Note    LOS: 4 days    Current Psychotropic Meds:     Venlafaxine  XR 225mg   Mirtazapine  15mg     Psychiatric Assessment:  PDD with intermittent MDEs, current episode  GAD with panic  Trauma and stressor related disorder    Recommendations:  No psychotropic medication changes as of 06/28/24.      Seen and discussed with: Dr. Erika    Please feel free to contact us  with any additional questions or concerns by paging the consult team between 8am and 5pm on weekdays and between 8am and 3pm on weekends at [phone]. Otherwise, page the psychiatry resident on call.    ATTESTATION    I personally performed or re-performed the history, physical exam and treatment for the E/M. I discussed the case with the Medical Student, and concur with the Medical Student documentation of history, physical exam and treatment plan unless otherwise noted.     Staff name:  Leita VEAR Erika, MD Date: 06/28/2024         --------------------------------------------------------------------------------------------------------------------------------  Subjective:  Hailey Rodgers was seen for follow-up in patient's room. Patient states her mood is not in a bad mood, but happy, anxious, depressed. States she has always been a sad person, but reports feeling more depressed due to her recent injury and not being able to remain active. Patient commented on missing her daughter and feeling sad because she will miss an upcoming class reunion. Patient talked about taking care of her husband, who had a TBI and reports feeling stressed. Patient had poor quality of sleep due to her pain level; states her medications will be adjusted today. Reports good appetite. Rates her anxiety a 5/10 (10 being the worst) due to being  hospitalized. Rates her depression 8/10 due to missing her daughter and not being able to make it to her class reunion, but states her baseline is a 6/10. Patient believes she never required a 1:1 for SI since she would never kill myself, I am not that selfish. I would never do that to my daughter. Patient denies medication side effect. Denies SI/HI. Denies AVH.    Review of Systems   Respiratory:  Negative for shortness of breath.    Cardiovascular:  Negative for chest pain.   Neurological:  Negative for dizziness, tremors and headaches.   Psychiatric/Behavioral:  Negative for hallucinations and suicidal ideas.         Objective:                   Vital Signs:  Current                Vital Signs: 24 Hour Range   BP: 122/67 (07/07 0718)  Temp: 36.7 ?C (98 ?F) (07/07 0718)  Pulse: 60 (07/07 0718)  Respirations: 18 PER MINUTE (07/07 0718)  SpO2: 97 % (07/07 0718)  O2 Device: None (Room air) (07/07 0718) BP: (107-137)/(56-74)   Temp:  [36.5 ?C (97.7 ?F)-36.8 ?C (98.3 ?F)]   Pulse:  [60-78]   Respirations:  [16 PER MINUTE-18 PER MINUTE]   SpO2:  [94 %-98 %]   O2 Device: None (Room air)    Intensity Pain Scale (Self Report): (not recorded)      Scheduled Medications:  aspirin  chewable tablet 81 mg, 81 mg, Oral, QHS  calcium  carbonate (OS-CAL) tablet 1,250 mg, 1,250 mg, Oral, BID  celecoxib  (CeleBREX ) capsule 200 mg, 200 mg, Oral, QDAY  CHOLEcalciferoL  (vitamin D3) tablet 5,000 Units, 5,000 Units, Oral, Sundays  enoxaparin  (LOVENOX ) syringe 40 mg, 40 mg, Subcutaneous, QDAY(21)  famotidine  (PEPCID ) tablet 40 mg, 40 mg, Oral, QHS  [START ON 06/30/2024] ferrous sulfate  (FEOSOL) tablet 325 mg, 325 mg, Oral, Once per day on Monday Wednesday Friday  folic acid  (FOLVITE ) tablet 4 mg, 4 mg, Oral, QDAY  insulin  aspart (U-100) (NOVOLOG  FLEXPEN U-100 INSULIN ) injection PEN 0-12 Units, 0-12 Units, Subcutaneous, ACHS (22)  insulin  aspart (U-100) (NOVOLOG  FLEXPEN U-100 INSULIN ) injection PEN 15 Units, 15 Units, Subcutaneous, TID before meals  insulin  glargine (LANTUS  SOLOSTAR U-100 INSULIN ) injection PEN 15 Units, 15 Units, Subcutaneous, BID  levothyroxine  (SYNTHROID ) tablet 88 mcg, 88 mcg, Oral, QDAY  lidocaine  (LIDODERM ) 5 % topical patch 1 patch, 1 patch, Topical, QDAY  loratadine  (CLARITIN ) tablet 10 mg, 10 mg, Oral, QDAY  mirtazapine  (REMERON ) tablet 15 mg, 15 mg, Oral, QDAY  multivitamin (ONE-A-DAY) tablet 1 tablet, 1 tablet, Oral, QDAY(08)  pantoprazole  DR (PROTONIX ) tablet 40 mg, 40 mg, Oral, QDAY  polyethylene glycol 3350  (MIRALAX ) packet 17 g, 17 g, Oral, QDAY  pramipexole  (MIRAPEX ) tablet 0.75 mg, 0.75 mg, Oral, QHS  predniSONE  (DELTASONE ) tablet 5 mg, 5 mg, Oral, QDAY  pregabalin  (LYRICA ) capsule 50 mg, 50 mg, Oral, Q8H  rosuvastatin  (CRESTOR ) tablet 20 mg, 20 mg, Oral, QDAY  venlafaxine  XR (EFFEXOR  XR) capsule 225 mg, 225 mg, Oral, QDAY        PRN Medications:  acetaminophen  Q4H PRN 650 mg at 06/28/24 0820, albuterol  sulfate Q4H PRN, artificial tears (PF) single dose PRN 1 drop at 06/26/24 0628, bisacodyL  QDAY PRN, dextrose  50% (D50) IV PRN 25 mL at 06/26/24 1833, diclofenac  sodium QID PRN 4 g at 06/27/24 0849, fluticasone  propionate BID PRN, hydrocortisone  acetate BID PRN, loperamide  PRN, melatonin QHS PRN, methocarbamoL  TID PRN, nitroglycerin  Q5 MIN PRN, oxyCODONE  Q6H PRN, sodium chloride  PRN      Mental Status Evaluation:   General/Constitutional:  73 y.o.  female, appears stated age, fair hygiene, fair grooming  Eye Contact: Good  Behavior: Calm, cooperative, no distress  Motor: No observed tics or tremors  Speech: RRR with normal tone and volume; good articulation  Mood: Not in a bad mood, but happy, anxious, depressed  Affect: Hypothymic, mood congruent  Thought Process: Linear and logical, goal directed  Thought Content: Denies SI, Denies HI; no evidence of delusions or paranoia  Perception: Denies AVH; does not appear to respond to internal stimuli  Insight/Judgment: Fair/Fair     Orientation: Grossly oriented  Recent and remote memory: Grossly intact  Attention span and concentration: Appropriate for conversation  Language: Average  Fund of knowledge and vocabulary: Average    Focused Physical Exam:  Neuro: Grossly intact, no obvious focal neuro deficits, no tremors or dystonia noted  Musculoskeletal: Moves all extremities spontaneously        Hailey Rodgers  MS3

## 2024-06-28 NOTE — Consults
 DON-Psychiatric and Behavioral Health Specialist- Initial Consult      Pt. Name: Hailey Rodgers  Admit Date: 06/23/2024  Room: AY3575/98         Reason for Admission:  Acute cystitis without hematuria    Reason for Consult:  Psychological support and treatment    Interventions:  1) Active Listening and Support  2) Encourage daily goal setting around sleep, mobility, nutrition, and social support    -patient identifies as independent and stubborn, giving her choice and control is important to her.   3) Encourage distractions to help with coping     Therapy Comments:   LSCSW met with patient for ongoing support. Patient shared she made a SI statement to nurse and didn't actually mean that she wanted to die. Patient admits to history of passive SI but would never act upon it. Her relationship with her daughter and witnessing after effects of friends who have completed suicide are protective factors.  She shares it is hard for her to lose so much control and Independence. Her goal is to get to her [employer] Reunion by walker.     Patient shared she lives with her daughter and husband in a trailer home and there is concern for mold. Patient's husband has a head injury from a work accident. They are still in legal proceedings. Patient's husband needs  care and is often confused (more so at night). His physicians have recommended IP Geri Psych but patient shares they are not certain they want him locked up.     Patient describes herself as stubborn and independent. She wants to regain independence and have pain controlled.     LSCSW provided contact information and encouraged patient to call with questions or concerns.       The primary team is responsible for all orders.    Thank you for allowing our service to participate in the care of this patient. Please Voalte with questions or concerns.      Summary:73 y.o. female with rheumatoid arthritis on chronic pre, OSA on BIPAP, chronic heart failure with preserved EF, CAD , HTN, dyslipidemia, IDDM type 2, history of urinary retentions , history of b T12-iliac decompression and fusion ~ 01/2024 presented to [facility] ED on 06/23/24 for fever along with acute on chronic back pain , diarrhea, foul urine/incontinence      Current Mental Health and Social Services:   Onset: Decades  Outpatient Provider: PCP managing psychotropics currently. Previously followed with Dr. Vicenta in psychiatry clinic, last seen 2022. Previously followed with Dr. Katrinka in the [facility] psychology clinic. She reports stopping following with Dr. Vicenta and Dr. Katrinka due to disagreements.   Diagnoses: PDD, GAD with panic, PNES  Medications: Remeron  7.5 mg QHS (started in May 2025), Effexor  225 mg daily  Psychiatric Hospitalizations:   Past Self-Injurious Behaviors: Denies  Past Suicide Attempts: Denies    Psychosocial History:  Lives with: husband and daughter   Support System: husband, daughter  Hobbies/Interests: crafts  Adult history of Trauma/Abuse/Neglect: history of trauma        Hailey Rodgers, LMSW,  LSCSW   Psychiatric Behavioral Health Specialist   Available on Voalte  Phone: [phone]

## 2024-06-28 NOTE — Progress Notes
 PHYSICAL THERAPY  PROGRESS NOTE          Name: Hailey Rodgers   MRN: [id number]     DOB: 10/21/1951      Age: 73 y.o.  Admission Date: 06/23/2024     LOS: 4 days     Date of Service: 06/28/2024        Mobility  Patient Turn/Position: Chair  Mobility Level [facility] Highest Level of Mobility (JH-HLM): Walk 250 feet or more  Distance Walked (feet): 350 ft  Level of Assistance: Assist X1  Assistive Device: Walker  Activity Limited By: Fatigue    Subjective  Reason for Admission and Past Medical Hx: History of rheumatoid arthritis on chronic pre,  OSA on BIPAP, chronic heart failure with preserved EF, CAD, HTN, dyslipidemia, IDDM type 2, history of urinary retentions, history of b  T12-iliac decompression and fusion ~ 01/2024  presented to [facility] ED on 06/23/24 for fever along with  acute on chronic back pain, diarrhea, foul urine/incontinence.    Mental / Cognitive: Alert;Oriented;Cooperative  Pain: Complains of pain  Pain level: During activity  Pain Location: Back  Pain Interventions: Patient agrees to participate in therapy with current pain level;Patient assisted into position of comfort  Persons Present:  (Rehab Physician visits with patient during therapy session)    Home Living Situation  Lives With: Spouse/significant other  Comments: Per patient, spouse not in good health after having a brain bleed, about 3 years ago. He is frequently incontinent and at times hallucinates.  Type of Home: Mobile home  Entry Stairs: Ramp  In-Home Stairs: No stairs  Bathroom Setup: Not accessible  Patient Owned Equipment: Cane: Single point;Walker: Rollator;Hospital bed;Commode: Standard (Lift chair)  Comments: Patient says she is unable to use walker inside her trailer due to clutter so she uses a cane.    Prior Level of Function  Level Of Independence: Independent with ADL and household mobility with device  Comments: Reports hallways too narrow to use the rollator at home. Uses cane in the house  History of Falls in Past 3 Months: Yes  Comments: Lost balance while using cane.  Comments: Patient reports recent rehab stay. She was home for ~2 weeks before being readmitted.    Sensation/Tone/Coordination  Posture: Kyphosis;Forward head    Bed Mobility/Transfer  Transfer Type: Sit to/from Stand (x6)  Transfer: Assistance Level: To/From;Bed;Minimal Assist  Transfer: Assistive Device: Nurse, adult  Transfers: Type Of Assistance: Materials engineer;For Balance;For Strength Deficit;For Safety Considerations  End Of Activity Status: Up in Chair;Nursing Notified;Instructed Patient to Request Assist with Mobility;Instructed Patient to Use Call Light    Balance  5x Sit to Stand Result (seconds): 0  5X Sit to Stand Comment:: Patient demonstrates ability to complete 5 stands from EOB at self-selected pace. Requires minimal assist + verbal cues for anterior weightshift + use of rolling walker.    Gait  Gait Distance: 350 feet  Gait: Assistance Level: Minimal Assist;Safety Considerations  Gait: Assistive Device: Roller Walker  Gait: Descriptors: Decreased heel strike LLE;Pace: Slow;No balance loss  Activity Limited By: Weakness;Complaint of Fatigue;Complaint of Pain    Education  Persons Educated: Patient  Patient Barriers To Learning: None Noted  Interventions: Repetition of Instructions;Demonstration Provided  Teaching Methods: Verbal Instruction;Demonstration  Patient Response: Verbalized Understanding;Return Demonstration;More Instruction Required  Topics: Plan/Goals of PT Interventions;Use of Assistive Device/Orthosis;Mobility Progression;Safety Awareness;Up with Assist Only;Importance of Increasing Activity;Recommend Continued Therapy    Assessment/Progress  Assessment/Progress: Should Improve w/ Continued PT    AM-PAC 6 Clicks  Basic Mobility Inpatient  Turning from your back to your side while in a flat bed without using bed rails: None  Moving from lying on your back to sitting on the side of a flat bed without using bedrails : A Little  Moving to and from a bed to a chair (including a wheelchair): A Little  Standing up from a chair using your arms (e.g. wheelchair, or bedside chair): A Little  To walk in hospital room: A Little  Climbing 3-5 steps with a railing: A Little  Basic Mobility Inpatient Raw Score: 19  Standardized (T-scale) Score: 42.48  AM-PAC Basic Mobility Functional Stage: 34-51 Limited Mobility Indoors  Mobility Goal [facility]: JH-HLM 6 Walk >= 10 steps    Goals  Goal Formulation: With Patient  Time For Goal Achievement: 3 days, To, 7 days  Patient Will Go Supine To/From Sit: w/ Stand By Assist  Patient Will Logroll: w/ Stand By Assist  Patient Will Transfer Bed/Chair: w/ Stand By Assist  Patient Will Transfer Sit to Stand: w/ Stand By Assist  Patient Will Ambulate: Greater than 200 Feet, w/ Vannie, w/ Stand By Assist    Plan  Treatment Interventions: Mobility training  Plan Frequency: 3-5 Days per Week  PT Plan for Next Visit: Ongoing core strengthening and emphasis on optimizing upright posture. Progress gait distance/quality with ongoing use of the roller walker. Repeated sit-to-stands.    PT Discharge Recommendations  Recommendation: Inpatient setting  Patient Currently Requires Physical Assist With: All mobility  Comments: Patient states Wherever I go I need to stay for at least a month. Any less than that I'm not going to get better.    Therapist: Rocky Hasten, PT, DPT [phone]  Date: 06/28/2024

## 2024-06-28 NOTE — Progress Notes
 General Progress Note    Name: Hailey Rodgers        MRN: [id number]          DOB: Mar 23, 1951            Age: 73 y.o.  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   female with rheumatoid arthritis on chronic pre,  OSA on BIPAP, chronic heart failure with preserved EF,  CAD , HTN, dyslipidemia, IDDM type 2, history of urinary retentions ,  history of b  T12-iliac decompression and fusion ~ 01/2024  presented to [facility] on 06/23/24 for fever along with  acute on chronic back pain , diarrhea, foul urine/incontinence    Fever secondary to UTI due to E coli - resolved  History of urinary retention s/p multiple foley in the past  - UA:p  positive nitrite, 3+ leukocytes, packed WBCs            - 2-3 days dysuria and fever PTA  Plan:  > Obtain bladder PRN now. Retention improved. Monitor.    > s/p ceftriaxone  ( 7/3 - 7/7) with resolution of urinary symptoms   - Due to recurrent urinary retention - will hold off gemtesa .    Fluid and gas within T12 vertebral body, lucency and T11. Hx of T12 iliac decompression/fusion on 01/2024  Acute on chronic back pain   - CT ABD/PELV W CONTRAST7/02/2024  1.  Prior laminectomies and posterior instrumented fusion from T12 through the sacrum with superior endplate compression deformities of T12 and L2 with mild vertebral body height loss, previously described on outside imaging 05/12/2024 (this imaging is not available for review). 2.  Fluid and gas within the T12 vertebral body with sclerosis throughout the vertebral body and partially marginated scalloped lucency of the adjacent inferior endplate of T11. These findings may be entirely related to a combination of postsurgical changes, recent fracture, and periprosthetic loosening/mechanical factors. However, superimposed discitis/osteomyelitis is a diagnostic consideration. Dedicated MRI of the thoracic and lumbar spine with contrast is recommended for further evaluation in the absence of recent comparative films to determine stability. 3.  Circumferential thickening of the urinary bladder wall consistent with cystitis given urinalysis results. F  - OSH 05/12/24 MRI T spine: Numbering scheme is based on the lumbar spine with the   wedging deformity and signal abnormalities at T12 noted as discussed in  the CT and MR lumbar spine.  There is some mild to moderate diffuse thoracic spondylosis with no  additional compression deformity seen. Moderate posterior ridging and  spurring is noted with mild to moderate  canal narrowing as discussed above. No intrinsic cord signal abnormalities or additional enhancing abnormalities are noted.   - osh 05/12/24 MRI L spine: moderate periimplant lucency surrounding the bilateral T12 pedicle   screws demonstrated on recent CT is associated with diffuse marrow edema throughout the entirety of the vertebral body, anterior superior  endplate fracture, and fluid-filled fracture cleft. The marrow signal  abnormality could be secondary to mechanical loosening and fracture.   - MRI spine : 1.  Long segment spinal posterior instrumentation spanning T12 through the   sacroiliac joints. Periprosthetic fracture of T12 with pedicle screw   loosening more completely assessed on same-day CT abdomen and pelvis.   2.  Scalloped inferior endplate height loss of T11 with mild enhancing   marrow edema of the T11 vertebral body and fluid within the T12 fracture   plane likely represents benign edema secondary to compression injuries,   junctional mechanical factors, and reactive osteitis. Superinfection is   less likely, particularly given absent secondary paraspinous or epidural   inflammatory findings.   Plan  > Continue PTA oxycodone  PRN , pregabalin50 mg TID, robaxin  750 TID PRN , tylenol  PRN, lidocaine  patch   - Patient previously had discontinued pregabalin  but found it helpful for her chronic back pain, had reduced dose to 25 mg TID initially, tolerated. Increased to 50 mg TID 7/7  - PT / OT consulted, recommending inpt. Rehab medicine consult         Diarrhea(non bloody)- resolved  - obtain C-diff PCR, stool culture, giardia , crypto - negative. Brief diarrhea for 2 days on the day prior and of admission, since resolved   - Diarrhea improving 7/4, CTM. Now she feels constipated. Can use PEG BID PRN, senna PRN      HLD  Chronic heart failure with preserved EF  OSA on BIPAP  - No oxygen at baseline                > Continue PTA aspirin                 > Continue PTA Crestor                 > Continue to hold bumex  and spirolactone for now. Daughter reports patient's bumex  and spirolactone has been HOLD since 02/2024 hospitalization. 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 This is restarted 3 weeks PTA with last dose on Sunday  06/20/24, likely can skip 1 week due to infection then resume      Hypothyroidism -continue PTA synthroid   GERD - continue PTA PPI    Suicidal ideation  MDD  - reports chronic SI, significant past trauma, chronic pain. Adamant that she isn't actually going to do it and says things like that sometimes. Reportedly had a near attempt ~ 6 months prior, was going to take pills, but when her daughter said she was going to report her SI, she threw the pills at daughter. [place] access [place] medications and means of [place] and says the she could have if she really wanted to but isnt going to  , psych consulted    - mirtazapine  15 qhs    - Continue venlafaxine  225   - DC CO   - outpatient follow up     Hx Iron  Def  - Taking BID PO iron  but sometimes constipated. Changed to 3xW    Hx PNES  - no signs of seizures on prior workup. CTM         FEN: no IVF, electrolytes reviewed, No diet orders on file   VTE Prophalyxis: lovenox   Disposition: continued admisison   Code status: Full Code  (Discussed on 06/24/2024)      Total Time Today was >35 minutes in the following activities: Preparing to see the patient, Obtaining and/or reviewing separately obtained history, Performing a medically appropriate examination and/or evaluation, Counseling and educating the patient/family/caregiver, Ordering medications, tests, or procedures, Referring and communication with other health care professionals (when not separately reported), Documenting clinical information in the electronic or other health record, and Independently interpreting results (not separately reported) and communicating results to the patient/family/caregiver                 Wound:      Wounds Skin tear Anterior;Left Knee (Active)   06/24/24 0620   Wound Type: Skin tear   Orientation: Anterior;Left   Location: Knee   Wound Location Comments: scab   Initial Wound Site Closure:    Initial Dressing Placed:    Initial Cycle:    Initial Suction Setting (mmHg):    Pressure Injury Stages:    Pressure Injury Present Within 24 Hours of Hospital Admission:    If This Pressure Injury Is Suspected to Be Device Related, Please Select the Device::    Is the Wound Open or Closed:    Wound Assessment Dressing not removed for assessment 06/27/24 2000   Peri-wound Assessment Dry;Intact 06/27/24 2000   Wound Drainage Amount None 06/27/24 2000   Wound Dressing Status Intact 06/27/24 2000   Wound Care Dressing changed or new application 06/26/24 2100   Wound Dressing and/or Treatment A & D ointment;Primapore 06/27/24 2000   Number of days: 4       Wounds Ulcer (Not pressure) Medial Sacrum (Active)   06/28/24 1032   Wound Type: Ulcer (Not pressure)   Orientation: Medial  Location: Sacrum   Wound Location Comments: medial right above buttocks.   Initial Wound Site Closure:    Initial Dressing Placed:    Initial Cycle:    Initial Suction Setting (mmHg):    Pressure Injury Stages:    Pressure Injury Present Within 24 Hours of Hospital Admission:    If This Pressure Injury Is Suspected to Be Device Related, Please Select the Device::    Is the Wound Open or Closed:    Number of days: 0                    _______________________________________________________________________    Subjective   Hailey Rodgers is a 73 y.o. female.  Pain still bothersome in back. Not having issues with urinary retention. Dysuria has entirely resolved. Feeling like breathing and slight cough are better with IS / OPEP. No other new symptoms or concerns. Had a bM, does not feel she needs additional milk of mag or other stool softeners at the moment. We discussed her medical stability for dc pending placement.        Medications  Scheduled Meds:aspirin  chewable tablet 81 mg, 81 mg, Oral, QHS  calcium  carbonate (OS-CAL) tablet 1,250 mg, 1,250 mg, Oral, BID  celecoxib  (CeleBREX ) capsule 200 mg, 200 mg, Oral, QDAY  CHOLEcalciferoL  (vitamin D3) tablet 5,000 Units, 5,000 Units, Oral, Sundays  enoxaparin  (LOVENOX ) syringe 40 mg, 40 mg, Subcutaneous, QDAY(21)  famotidine  (PEPCID ) tablet 40 mg, 40 mg, Oral, QHS  [START ON 06/30/2024] ferrous sulfate  (FEOSOL) tablet 325 mg, 325 mg, Oral, Once per day on Monday Wednesday Friday  folic acid  (FOLVITE ) tablet 4 mg, 4 mg, Oral, QDAY  insulin  aspart (U-100) (NOVOLOG  FLEXPEN U-100 INSULIN ) injection PEN 0-12 Units, 0-12 Units, Subcutaneous, ACHS (22)  insulin  aspart (U-100) (NOVOLOG  FLEXPEN U-100 INSULIN ) injection PEN 15 Units, 15 Units, Subcutaneous, TID before meals  insulin  glargine (LANTUS  SOLOSTAR U-100 INSULIN ) injection PEN 15 Units, 15 Units, Subcutaneous, BID  levothyroxine  (SYNTHROID ) tablet 88 mcg, 88 mcg, Oral, QDAY  lidocaine  (LIDODERM ) 5 % topical patch 1 patch, 1 patch, Topical, QDAY  loratadine  (CLARITIN ) tablet 10 mg, 10 mg, Oral, QDAY  mirtazapine  (REMERON ) tablet 15 mg, 15 mg, Oral, QDAY  multivitamin (ONE-A-DAY) tablet 1 tablet, 1 tablet, Oral, QDAY(08)  pantoprazole  DR (PROTONIX ) tablet 40 mg, 40 mg, Oral, QDAY  polyethylene glycol 3350  (MIRALAX ) packet 17 g, 17 g, Oral, QDAY  pramipexole  (MIRAPEX ) tablet 0.75 mg, 0.75 mg, Oral, QHS  predniSONE  (DELTASONE ) tablet 5 mg, 5 mg, Oral, QDAY  pregabalin  (LYRICA ) capsule 50 mg, 50 mg, Oral, Q8H  rosuvastatin  (CRESTOR ) tablet 20 mg, 20 mg, Oral, QDAY  venlafaxine  XR (EFFEXOR  XR) capsule 225 mg, 225 mg, Oral, QDAY    Continuous Infusions:  PRN and Respiratory Meds:acetaminophen  Q4H PRN, albuterol  sulfate Q4H PRN, artificial tears (PF) single dose PRN, bisacodyL  QDAY PRN, dextrose  50% (D50) IV PRN, diclofenac  sodium QID PRN, fluticasone  propionate BID PRN, hydrocortisone  acetate BID PRN, loperamide  PRN, melatonin QHS PRN, methocarbamoL  TID PRN, nitroglycerin  Q5 MIN PRN, oxyCODONE  Q6H PRN, sodium chloride  PRN        Objective                        Vital Signs: Last Filed                 Vital Signs: 24 Hour Range   BP: 136/66 (07/07 1110)  Temp: 36.8 ?C (98.3 ?F) (07/07 1110)  Pulse: 62 (07/07 1110)  Respirations: 16  PER MINUTE (07/07 1110)  SpO2: 98 % (07/07 1110)  O2 Device: None (Room air) (07/07 1110) BP: (107-136)/(56-72)   Temp:  [36.5 ?C (97.7 ?F)-36.8 ?C (98.3 ?F)]   Pulse:  [60-78]   Respirations:  [16 PER MINUTE-18 PER MINUTE]   SpO2:  [96 %-98 %]   O2 Device: None (Room air)   Intensity Pain Scale (Self Report): 1 (06/28/24 0820) Vitals:    06/26/24 0554 06/27/24 0354 06/28/24 0137   Weight: 82.5 kg (181 lb 14.1 oz) 83.1 kg (183 lb 3.2 oz) 78.5 kg (173 lb)         Intake/Output Summary:  (Last 24 hours)    Intake/Output Summary (Last 24 hours) at 06/28/2024 1237  Last data filed at 06/27/2024 2000  Gross per 24 hour   Intake 980 ml   Output 0 ml   Net 980 ml     Stool Occurrence: 0

## 2024-06-28 NOTE — Progress Notes
 Pharmacy High Risk Medication Review    A high risk medication review has been performed for Hailey Rodgers by a pharmacy team member.      Medication class(es) to review on inpatient med list:  Opioids and Muscle relaxant      The medications class(es) are noted to be high risk to mobility or mentation in patients >73 years of age. If your patient has been CAM positive (diagnosed with delirium) at any point during this hospitalization then it would be particularly helpful to review these medications. Each medication has its own unique risks and benefits to the patient, we ask that these medications are reviewed and considered for de-prescribing where appropriate.     Consider reviewing [email or web address].com for anticholinergic burden calculation.  If you need further assistance identifying or de-prescribing, please contact pharmacy.      Current Inpatient Medications:  Scheduled Meds:aspirin  chewable tablet 81 mg, 81 mg, Oral, QHS  calcium  carbonate (OS-CAL) tablet 1,250 mg, 1,250 mg, Oral, BID  celecoxib  (CeleBREX ) capsule 200 mg, 200 mg, Oral, QDAY  CHOLEcalciferoL  (vitamin D3) tablet 5,000 Units, 5,000 Units, Oral, Sundays  enoxaparin  (LOVENOX ) syringe 40 mg, 40 mg, Subcutaneous, QDAY(21)  famotidine  (PEPCID ) tablet 40 mg, 40 mg, Oral, QHS  [START ON 06/30/2024] ferrous sulfate  (FEOSOL) tablet 325 mg, 325 mg, Oral, Once per day on Monday Wednesday Friday  folic acid  (FOLVITE ) tablet 4 mg, 4 mg, Oral, QDAY  insulin  aspart (U-100) (NOVOLOG  FLEXPEN U-100 INSULIN ) injection PEN 0-12 Units, 0-12 Units, Subcutaneous, ACHS (22)  insulin  aspart (U-100) (NOVOLOG  FLEXPEN U-100 INSULIN ) injection PEN 15 Units, 15 Units, Subcutaneous, TID before meals  insulin  glargine (LANTUS  SOLOSTAR U-100 INSULIN ) injection PEN 15 Units, 15 Units, Subcutaneous, BID  levothyroxine  (SYNTHROID ) tablet 88 mcg, 88 mcg, Oral, QDAY  lidocaine  (LIDODERM ) 5 % topical patch 1 patch, 1 patch, Topical, QDAY  loratadine  (CLARITIN ) tablet 10 mg, 10 mg, Oral, QDAY  mirtazapine  (REMERON ) tablet 15 mg, 15 mg, Oral, QDAY  multivitamin (ONE-A-DAY) tablet 1 tablet, 1 tablet, Oral, QDAY(08)  pantoprazole  DR (PROTONIX ) tablet 40 mg, 40 mg, Oral, QDAY  polyethylene glycol 3350  (MIRALAX ) packet 17 g, 17 g, Oral, QDAY  pramipexole  (MIRAPEX ) tablet 0.75 mg, 0.75 mg, Oral, QHS  predniSONE  (DELTASONE ) tablet 5 mg, 5 mg, Oral, QDAY  pregabalin  (LYRICA ) capsule 50 mg, 50 mg, Oral, Q8H  rosuvastatin  (CRESTOR ) tablet 20 mg, 20 mg, Oral, QDAY  venlafaxine  XR (EFFEXOR  XR) capsule 225 mg, 225 mg, Oral, QDAY    Continuous Infusions:  PRN and Respiratory Meds:acetaminophen  Q4H PRN, albuterol  sulfate Q4H PRN, artificial tears (PF) single dose PRN, bisacodyL  QDAY PRN, dextrose  50% (D50) IV PRN, diclofenac  sodium QID PRN, fluticasone  propionate BID PRN, hydrocortisone  acetate BID PRN, loperamide  PRN, melatonin QHS PRN, methocarbamoL  TID PRN, nitroglycerin  Q5 MIN PRN, oxyCODONE  Q6H PRN, sodium chloride  PRN        Thank you,  Hildegard Boatman, [facility]  06/28/2024

## 2024-06-28 NOTE — Progress Notes
 DON-Psychiatric and Behavioral Health Specialist     Pt. Name: Hailey Rodgers  Admit Date: 06/23/2024  Room: [id number]/[id number]         LSCSW attempted to meet with patient. Patient sleeping soundly. LSCSW/Psych RN will continue to follow.     Thank you for allowing our service to participate in the care of this patient. Please Voalte with questions or concerns.    Shraddha Lebron, LMSW,  LSCSW   Psychiatric Behavioral Health Specialist   Available on Voalte  Phone: [phone]

## 2024-06-28 NOTE — Consults
 Physical Medicine & Rehabilitation Consult Service    Name: ICESS Rodgers   MRN: [id number]     DOB: Jan 11, 1951      Age: 73 y.o.  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 year old female admitted to [facility] on  06/23/2024 with the following issues:    stenosis - lumbar    Impairments: poor activity tolerance  Activity Limitations:  grooming, bathing, transfers, and ambulation  Participation Restrictions: unable to return home safely  Family / Patient Dispositional Goals: return home with family supervision    Overall Functional Goals  Modified independent to supervision for mobility and self-cares    Recommendations:  Post-acute care rehabilitation needs: She has had multiple acute inpatient rehabilitation stays but otherwise declines functionally when she is discharged home.  She does have concerns on how she will manage at home.  Her husband likely requires placement for dementia related to traumatic brain injury.  Community-based acute rehabitation versus subacute rehabitation/skilled nursing facility seems appropriate.      Barriers/Facilitators:  Barriers: Caregiver apprehension, Patient motivation, Poor family/social support, and Poor strength/endurance  Facilitators: patient motivation  Rehabilitation Prognosis: Fair    Tolerance for three hours of therapy a day: Fair    ____________________________________________________________________________    Impaired gait/mobility/transfers:  The patient will benefit from continued work with PT to address mobility deficits    Impaired ADLs:  The patient will benefit from ongoing OT to address functional deficits    Other recommendations (bowel, bladder, skin, pain, etc.)  Encouraged her to use a walker when she returns home.    Thank you for this consultation.  Please call our consult pager with questions or concerns.    Hailey ONEIDA Rowels, MD    History of Present Illness:    CC: Falls due to urinary tract infection    Hospital Course: [name] is a pleasant 73 y.o. female with PMH of chronic low back pain with multiple surgical procedures and frequent urinary tract infections, who was admitted for [facility] on 06/23/2024 with symptoms of urinary tract infection.    I have seen her previously when she was at [facility].  She was able to discharge to home but has had multiple falls at home.    She works diligently with therapy but reports that she often does not have enough time when she is in acute rehabitation.  She is working with care progression and they are looking at  facilities closer to her home with [facility] in [place], [place] .  Physiatry was consulted to evaluate for rotation needs.    She reports that her husband has dementia from a traumatic brain injury.    She does not see progress at times with therapy that the therapists and attending physicians have noted in the past.    She does worry about her falls at home.  She does have chronic low back pain but does get oversedated easily with opioids and muscle relaxants. Physiatry was consulted to assist with rehabilitation planning.    She was seen working with therapy.     The patient's family/social support consists of: Daughter.    Past Medical History:    Age-related physical debility    Allergic rhinitis    Arthritis    Asthma    Bronchitis    Cancer (CMS-HCC)    Cervical stenosis of spinal canal    Convulsion (CMS-HCC)    Coronary artery disease    COVID-19 virus infection    Disorganized thinking    Diverticulosis    DM (diabetes mellitus) (CMS-HCC)    Dyslipidemia    Dyspnea    Fatty liver disease, nonalcoholic    Gastroesophageal reflux disease without esophagitis    GERD (gastroesophageal reflux disease)    Hypertriglyceridemia    Hypothyroidism    Hypothyroidism due to acquired atrophy of thyroid    Infection    Leg weakness    Lumbar stenosis    Lung disease    Lung nodule    Memory loss    Movement disorder    Narcolepsy    Obesity    On supplemental oxygen therapy    OSA (obstructive sleep apnea)    Other dysphagia    Other hyperlipidemia    Peripheral neuropathy    Polypharmacy    Postmenopausal    Pseudoseizures    Recurrent major depressive disorder, in partial remission    Rhabdomyolysis    Rheumatoid arthritis (CMS-HCC)    Seizures (CMS-HCC)    Sleep disorder    Syncope and collapse    Type 2 diabetes mellitus with hyperglycemia, with long-term current use of insulin  (CMS-HCC)    Unspecified deficiency anemia    Urinary incontinence    Urinary problem    Vision decreased        Surgical History:   Procedure Laterality Date    HX ADENOIDECTOMY  1962    HX TONSILLECTOMY  1962    TUBAL LIGATION  1977    RECTAL SURGERY  1979    for rectal prolapse; bladder repair    CHOLECYSTECTOMY  1984    HYSTERECTOMY  2000    BREAST BIOPSY Right 2012    COLONOSCOPY  2016    CERVICAL 3 TO THORACIC 7 DECOMPRESSION AND FUSION Bilateral 07/18/2015    Performed by Vivica Domino, MD at [facility] OR    BLEPHAROPLASTY      CARDIOVASCULAR STRESS TEST      CERVIX SURGERY  2016 ECHOCARDIOGRAM PROCEDURE  2022    ELECTROCARDIOGRAM      EVENT MONITOR      Have Loop monitor    HERNIA REPAIR  1984/2007    SEPTOPLASTY  1986/2013    TRANSESOPHAGEAL ECHO      UPPER GASTROINTESTINAL ENDOSCOPY          Social History     Socioeconomic History    Marital status: Married    Number of children: 2   Occupational History  Occupation: DISABLE    Tobacco Use    Smoking status: Never    Smokeless tobacco: Never   Vaping Use    Vaping status: Never Used   Substance and Sexual Activity    Alcohol use: Never    Drug use: Never    Sexual activity: Not Currently     Partners: Male     Birth control/protection: Post-menopausal, None       Family history reviewed; non-contributory     Scheduled Meds:aspirin  chewable tablet 81 mg, 81 mg, Oral, QHS  calcium  carbonate (OS-CAL) tablet 1,250 mg, 1,250 mg, Oral, BID  celecoxib  (CeleBREX ) capsule 200 mg, 200 mg, Oral, QDAY  CHOLEcalciferoL  (vitamin D3) tablet 5,000 Units, 5,000 Units, Oral, Sundays  enoxaparin  (LOVENOX ) syringe 40 mg, 40 mg, Subcutaneous, QDAY(21)  famotidine  (PEPCID ) tablet 40 mg, 40 mg, Oral, QHS  [START ON 06/30/2024] ferrous sulfate  (FEOSOL) tablet 325 mg, 325 mg, Oral, Once per day on Monday Wednesday Friday  folic acid  (FOLVITE ) tablet 4 mg, 4 mg, Oral, QDAY  insulin  aspart (U-100) (NOVOLOG  FLEXPEN U-100 INSULIN ) injection PEN 0-12 Units, 0-12 Units, Subcutaneous, ACHS (22)  insulin  aspart (U-100) (NOVOLOG  FLEXPEN U-100 INSULIN ) injection PEN 15 Units, 15 Units, Subcutaneous, TID before meals  insulin  glargine (LANTUS  SOLOSTAR U-100 INSULIN ) injection PEN 15 Units, 15 Units, Subcutaneous, BID  levothyroxine  (SYNTHROID ) tablet 88 mcg, 88 mcg, Oral, QDAY  lidocaine  (LIDODERM ) 5 % topical patch 1 patch, 1 patch, Topical, QDAY  loratadine  (CLARITIN ) tablet 10 mg, 10 mg, Oral, QDAY  mirtazapine  (REMERON ) tablet 15 mg, 15 mg, Oral, QDAY  multivitamin (ONE-A-DAY) tablet 1 tablet, 1 tablet, Oral, QDAY(08)  pantoprazole  DR (PROTONIX ) tablet 40 mg, 40 mg, Oral, QDAY  polyethylene glycol 3350  (MIRALAX ) packet 17 g, 17 g, Oral, QDAY  pramipexole  (MIRAPEX ) tablet 0.75 mg, 0.75 mg, Oral, QHS  predniSONE  (DELTASONE ) tablet 5 mg, 5 mg, Oral, QDAY  pregabalin  (LYRICA ) capsule 50 mg, 50 mg, Oral, Q8H  rosuvastatin  (CRESTOR ) tablet 20 mg, 20 mg, Oral, QDAY  venlafaxine  XR (EFFEXOR  XR) capsule 225 mg, 225 mg, Oral, QDAY    Continuous Infusions:  PRN and Respiratory Meds:acetaminophen  Q4H PRN, albuterol  sulfate Q4H PRN, artificial tears (PF) single dose PRN, bisacodyL  QDAY PRN, dextrose  50% (D50) IV PRN, diclofenac  sodium QID PRN, fluticasone  propionate BID PRN, hydrocortisone  acetate BID PRN, loperamide  PRN, melatonin QHS PRN, methocarbamoL  TID PRN, nitroglycerin  Q5 MIN PRN, oxyCODONE  Q6H PRN, sodium chloride  PRN       Allergies   Allergen Reactions    Amoxicillin-Pot Clavulanate HIVES, ITCHING and URTICARIA    Gemfibrozil HIVES    Morphine  MENTAL STATUS CHANGES     Patient became unresponsive within 1 minute of 4 mg of morphine .  Did not become apneic.  Required narcan     Trazodone HALLUCINATIONS    Adhesive Tape (Rosins) RASH    Atorvastatin SEE COMMENTS     States it caused her to have a fatty liver.   Reaction: LFT elevation; Comment: LIPITOR  Reaction: LFT elevation; Comment: LIPITOR  Reaction: LFT elevation; Comment: LIPITOR  Reaction: LFT elevation; Comment: LIPITOR      Latex RASH    Bupropion SEE COMMENTS     Allergy recorded in SMS: WELLBUTRIN~Reactions: ABNORMAL SEIZUR    Cymbalta [Duloxetine] UNKNOWN    Levaquin [Levofloxacin] DIARRHEA                 Prior Level of Function:     Functions primarily with a walker for mobility and self-cares.    Home Environment:  No data recorded  No data recorded  Type of Home: Mobile home (06/28/2024  2:00 PM)    Entry Stairs: Ramp (06/28/2024  2:00 PM)    In-Home Stairs: No stairs (06/28/2024  2:00 PM)    No data recorded  No data recorded  @ADDRESSFULL @      Current Level Of Function:  PT Gait:Gait Distance: 350 feet Gait: Assistance Level: Minimal Assist, Safety Considerations Gait: Assistive Device: Roller Walker  Bed Mobility/Transfers  Bed Mobility: Supine to Sit: Minimal Assist, Head of Bed Elevated, Verbal Cues, Assist with Trunk  Bed Mobility: Sit to Supine: Minimal Assist, Bed Flat, Verbal Cues, Assist with B LE  Transfer Type: Sit to/from Stand (x6)  Transfer: Assistance Level: To/From, Bed, Minimal Assist  Transfer: Assistive Device: Nurse, adult  Transfers: Type Of Assistance: Materials engineer, For Balance, For Strength Deficit, For Safety Considerations  Other Transfer Type: Sit to/from Stand  Other Transfer: Assistance Level: To/From, Pensions consultant, Minimal Assist  Other Transfer: Assistive Device: Nurse, adult  Other Transfer: Type Of Assistance: For Safety Considerations, For Strength Deficit, For Balance, Verbal Cues  End Of Activity Status: Up in Chair, Nursing Notified, Instructed Patient to Request Assist with Mobility, Instructed Patient to Use Call Light   OT ADL's  Where Assessed: Standing at Sink, Edge of Bed, In [employer] Assist: Stand By Assist  Grooming Deficits: Wash/Dry Hands, Wash/Dry Face  LE Dressing Assist: Total Assist  LE Dressing Deficits: Don/Doff R Sock, Don/Doff L Sock  Toileting Assist: Minimal Assist  Toileting Deficits: Clothing Management Up, Clothing Management Down  Comment: Patient atttempted donning socks at EOB in figure 4 position but unable to due to pain.   SLP COGNITIVE EVALUATION SUMMARY     PRAGMATICS:    BEHAVIOR:    AUDITORY COMPREHENSION:      ORIENTATION:    AUDITORY ATTENTION/WORKING MEMORY:    AUDITORY MEMORY/SUSTAINED ATTENTION:    NEW LEARNING:    SEQUENCING/ORGANIZATION:    PROBLEM SOLVING:    REASONING:      MATH/MONEY SKILLS:      VISUAL PERCEPTUAL:    SWALLOW EVALUATION SUMMARY                       Review of Systems:    A 14 point review of systems was negative except for: that noted in the HPI      Physical Exam:    BP: 136/66 (07/07 1110)  Temp: 36.8 ?C (98.3 ?F) (07/07 1110)  Pulse: 62 (07/07 1110)  Respirations: 16 PER MINUTE (07/07 1110)  SpO2: 98 % (07/07 1110)  O2 Device: None (Room air) (07/07 1110)  Body mass index is 27.5 kg/m?.     Gen: Alert & Oriented X 3.  HEENT: EOMI  Neck: Supple, no elevated JVP  Heart: Extremities well perfused  Lungs: non labored breathing  Abdomen: Soft, non-tender, non-distended, +BS  Skin: no gross lesions appreciated  Ext: purposeful movement of extremities   MS:   Root Right Left   Shoulder Abduction C5 4 4   Elbow Flexion C5 5 5   Elbow Extension C7 5 5   Wrist Extension C6 5 5   Finger Flexion C8 5 5   Finger Abduction T1 5 5   Hip Flexion L2 4 4   Knee Flexion L5/S1 5 5   Knee Extension L3 5 5   Dorsiflexion L4 5 5   Plantarflexion S1 5 5   EHL Extension L5 5 5  Neuro:  Cranial Nerves Cranial Nerves 2-12 are grossly intact   DTR's 1+ throughout   Babinski Plantar Reflex is Downgoing Bilaterally   Upper Extremity Tone Normal   Lower Extremity Tone Normal   Upper Extremity Sensation Intact to light touch bilaterally   Lower Extremity Sensation Diminished to light touch.   Clonus Negative Bilaterally   Proprioception Intact Bilaterally   Memory/Cognition/Speech Within limits           Intake/Output Summary (Last 24 hours) at 06/28/2024 1559  Last data filed at 06/27/2024 2000  Gross per 24 hour   Intake 500 ml   Output --   Net 500 ml        Hematology:    Lab Results   Component Value Date    HGB 11.9 06/28/2024    HGB 12.7 09/03/2023    HCT 36.2 06/28/2024    HCT 38.6 09/03/2023    PLTCT 261 06/28/2024    PLTCT 251 09/03/2023    WBC 5.30 06/28/2024    WBC 6.5 09/03/2023    NEUT 80.4 06/23/2024    NEUT 67 09/03/2023    ANC 6.90 06/23/2024    ANC 4.40 09/03/2023    ALC 1.00 06/23/2024    ALC 1.07 09/03/2023    MONA 7.3 06/23/2024    MONA 10 09/03/2023    AMC 0.60 06/23/2024    AMC 0.67 09/03/2023    EOSA 0.5 06/23/2024    EOSA 5 09/03/2023    ABC 0.00 06/23/2024    ABC 0.05 09/03/2023    MCV 79.1 06/28/2024    MCV 88.2 09/03/2023    MCH 25.9 06/28/2024    MCH 29.1 09/03/2023    MCHC 32.8 06/28/2024    MCHC 33.0 09/03/2023    MPV 7.8 06/28/2024    MPV 8.1 09/03/2023    RDW 17.3 06/28/2024    RDW 14.5 09/03/2023   , Coagulation:    Lab Results   Component Value Date    PT 13.4 06/25/2024    PT 11.7 02/11/2023    PTT 28.7 06/25/2024    PTT 29.5 02/11/2023    INR 1.2 06/25/2024    INR 1.1 02/11/2023    and General Chemistry:    Lab Results   Component Value Date    NA 140 06/28/2024    NA 141 09/03/2023    K 4.1 06/28/2024    K 4.1 09/03/2023    CL 104 06/28/2024    CL 104 09/03/2023    CO2 27 06/28/2024    CO2 25 09/03/2023    GAP 9 06/28/2024    GAP 12 09/03/2023    BUN 12 06/28/2024    BUN 28 09/03/2023    CR 0.49 06/28/2024    CR 0.77 09/03/2023    GLU 86 06/28/2024    GLU 149 09/03/2023    GLU 90 11/24/2004    CA 9.5 06/28/2024    CA 10.0 09/03/2023    ALBUMIN 3.6 06/28/2024    ALBUMIN 4.3 09/03/2023    OBSCA 1.15 07/18/2015    MG 1.9 06/27/2024    MG 2.1 06/10/2023    TOTBILI 0.2 06/28/2024    TOTBILI 0.4 09/03/2023    PO4 4.1 06/27/2024    PO4 4.6 06/10/2023        Radiology: MRI of the thoracic and lumbar spine reviewed     Hailey ONEIDA Rowels, MD

## 2024-06-28 NOTE — Consults
 Wound Ostomy Note    NAME:Hailey Rodgers                                                                   MRN: [id number]                 DOB:1950/12/28          AGE: 73 y.o.  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   female with rheumatoid arthritis on chronic pre,  OSA on BIPAP, chronic heart failure with preserved EF,  CAD , HTN, dyslipidemia, IDDM type 2, history of urinary retentions ,  history of b  T12-iliac decompression and fusion ~ 01/2024  presented to [place] ED on 06/23/24 for fever along with  acute on chronic back pain , diarrhea, foul urine/incontinence     Consult: coccyx    Assessment: Pt's RN at bedside. Pt was able to stand for me to assess. She has a long scar from spinal surgery that extends the length of her back. She has a dimple at her coccyx with intact skin. Blanchable red area near the dimple, skin is intact. No wounds noted. Ok to continue the HAPI bundle.    Wound Team will sign-off.    Corean Makos, RN, BSN, CWON  Wound Ostomy Nursing Consult Service  Available via [employer] Text M-F [phone]  For questions after 4:00pm M-F/weekends/holidays, voalte ?First Contact?SABRA

## 2024-06-28 NOTE — Consults
 See previous note

## 2024-06-28 NOTE — Case Management (ED)
 1157: Notified by Eleanor (SW) that the patient is anticipated to be ready for discharge today and would prefer to stay at [facility] IP rehab unit. [facility] admission office will await for completion of rehab consult to determine most appropriate level of discharge needs.    Ronal Jenkins Canard, BSN, RN  [facility] Admission Nurse (office: [phone]) or (voalte).

## 2024-06-29 ENCOUNTER — Encounter: Admit: 2024-06-29 | Discharge: 2024-06-29 | Payer: MEDICARE

## 2024-06-29 LAB — POC GLUCOSE
~~LOC~~ BKR POC GLUCOSE: 188 mg/dL — ABNORMAL HIGH (ref 70–100)
~~LOC~~ BKR POC GLUCOSE: 140 mg/dL — ABNORMAL HIGH (ref 70–100)
~~LOC~~ BKR POC GLUCOSE: 111 mg/dL — ABNORMAL HIGH (ref 70–100)
~~LOC~~ BKR POC GLUCOSE: 136 mg/dL — ABNORMAL HIGH (ref 70–100)
~~LOC~~ BKR POC GLUCOSE: 138 mg/dL — ABNORMAL HIGH (ref 70–100)
~~LOC~~ BKR POC GLUCOSE: 160 mg/dL — ABNORMAL HIGH (ref 70–100)

## 2024-06-29 LAB — CULTURE-BLOOD W/SENSITIVITY

## 2024-06-29 MED ORDER — DIGITAL BODY WEIGHT SCALE
0 refills | Status: CN
Start: 2024-06-29 — End: ?

## 2024-06-29 MED ORDER — BLOOD PRESSURE TEST KIT-LARGE MISC KIT
PACK | 0 refills | Status: CN
Start: 2024-06-29 — End: ?

## 2024-06-29 MED ORDER — NIFEDIPINE 0.3%/LIDOCAINE 1.5%/WHITE PETROLATUM(#) 100G
Freq: Four times a day (QID) | TOPICAL | 0 refills | Status: DC | PRN
Start: 2024-06-29 — End: 2024-07-01

## 2024-06-29 NOTE — Progress Notes
 General Progress Note    Name: Hailey Rodgers        MRN: [id number]          DOB: 06-Jan-1951            Age: 73 y.o.  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   female with rheumatoid arthritis on chronic pre,  OSA on BIPAP, chronic heart failure with preserved EF,  CAD , HTN, dyslipidemia, IDDM type 2, history of urinary retentions ,  history of b  T12-iliac decompression and fusion ~ 01/2024  presented to [facility] on 06/23/24 for fever along with  acute on chronic back pain , diarrhea, foul urine/incontinence    Fever secondary to UTI due to E coli - resolved  History of urinary retention s/p multiple foley in the past  - UA:p  positive nitrite, 3+ leukocytes, packed WBCs            - 2-3 days dysuria and fever PTA  Plan:  > Obtain bladder PRN now. Retention improved. Monitor.    > s/p ceftriaxone  ( 7/3 - 7/7) with resolution of urinary symptoms     Fluid and gas within T12 vertebral body, lucency and T11. Hx of T12 iliac decompression/fusion on 01/2024  Acute on chronic back pain   - CT ABD/PELV W CONTRAST7/02/2024  1.  Prior laminectomies and posterior instrumented fusion from T12 through the sacrum with superior endplate compression deformities of T12 and L2 with mild vertebral body height loss, previously described on outside imaging 05/12/2024 (this imaging is not available for review). 2.  Fluid and gas within the T12 vertebral body with sclerosis throughout the vertebral body and partially marginated scalloped lucency of the adjacent inferior endplate of T11. These findings may be entirely related to a combination of postsurgical changes, recent fracture, and periprosthetic loosening/mechanical factors. However, superimposed discitis/osteomyelitis is a diagnostic consideration. Dedicated MRI of the thoracic and lumbar spine with contrast is recommended for further evaluation in the absence of recent comparative films to determine stability. 3.  Circumferential thickening of the urinary bladder wall consistent with cystitis given urinalysis results. F  - OSH 05/12/24 MRI T spine: Numbering scheme is based on the lumbar spine with the   wedging deformity and signal abnormalities at T12 noted as discussed in  the CT and MR lumbar spine.  There is some mild to moderate diffuse thoracic spondylosis with no  additional compression deformity seen. Moderate posterior ridging and  spurring is noted with mild to moderate canal narrowing as discussed above. No intrinsic cord signal abnormalities or additional  enhancing abnormalities are noted.   - osh 05/12/24 MRI L spine: moderate periimplant lucency surrounding the bilateral T12 pedicle   screws demonstrated on recent CT is associated with diffuse marrow edema throughout the entirety of the vertebral body, anterior superior  endplate fracture, and fluid-filled fracture cleft. The marrow signal  abnormality could be secondary to mechanical loosening and fracture.   - MRI spine : 1.  Long segment spinal posterior instrumentation spanning T12 through the   sacroiliac joints. Periprosthetic fracture of T12 with pedicle screw   loosening more completely assessed on same-day CT abdomen and pelvis.   2.  Scalloped inferior endplate height loss of T11 with mild enhancing   marrow edema of the T11 vertebral body and fluid within the T12 fracture   plane likely represents benign edema secondary to compression injuries,   junctional mechanical factors, and reactive osteitis. Superinfection is   less likely, particularly given absent secondary paraspinous or epidural   inflammatory findings.   Plan  > Continue PTA oxycodone  PRN , pregabalin50 mg TID, robaxin  750 TID PRN , tylenol  PRN, lidocaine  patch   - Patient previously had discontinued pregabalin  but found it helpful for her chronic back pain, had reduced dose to 25 mg TID initially, tolerated. Increased to 50 mg TID 7/7  - PT / OT consulted, recommending inpt. Rehab medicine consult         Diarrhea(non bloody)- resolved  - obtain C-diff PCR, stool culture, giardia , crypto - negative. Brief diarrhea for 2 days on the day prior and of admission, since resolved   - Diarrhea improving 7/4, then constiapted. Can use PEG BID PRN, senna PRN      HLD  Chronic heart failure with preserved EF  OSA on BIPAP  - No oxygen at baseline                > Continue PTA aspirin                 > Continue PTA Crestor                 > Continue to hold bumex  and spirolactone for now. Daughter reports patient's bumex  and spirolactone has been HOLD since 02/2024 hospitalization. Patient is NO longer o jardiance  since she request to stop due to hx of UTI ~ few months ago also                > Continue PTA BIPAP (auto titration, no oxygen bleed)        DDM type 2  - Home regimen: lantus  40 units BID, trulicity  1.5 mg weekly, aspart 30 TID w/ meals and sliding scale                > decrease glargine 36 --> 34 BID (40-BID PTA) --> 20 units BID due to hypoglycemia 7/5 --> 15 BID 7/8 due to relative hypoglycemia and aspart 25 TID --> 15 units (30 PTA)                > Patient to continue PTA trulicity  after discharge or post-rehab      RLS  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                 > Continue PTA mirapex   RLS     Rheumatoid arthritis                 > Continue PTA folic acid , prednisone  5 mg daily                >  Continue PTA Celebrex  daily                 > Hold PTA methotrexate  subcutaneous 25 mg every Sunday. This is restarted 3 weeks PTA with last dose on Sunday  06/20/24, likely can skip 1 week due to infection then resume      Hypothyroidism -continue PTA synthroid   GERD - continue PTA PPI    Suicidal ideation  MDD  - reports chronic SI, significant past trauma, chronic pain. Adamant that she isn't actually going to do it and says things like that sometimes. Reportedly had a near attempt ~ 6 months prior, was going to take pills, but when her daughter said she was going to report her SI, she threw the pills at daughter. [place] access [place] medications and means [place] and says the she could have if she really wanted to but isnt going to  , psych consulted    - mirtazapine  15 qhs    - Continue venlafaxine  225   - DC CO   - outpatient follow up     Hx Iron  Def  - Taking BID PO iron  but sometimes constipated. Changed to 3xW    Hx PNES  - no signs of seizures on prior workup. CTM         FEN: no IVF, electrolytes reviewed, No diet orders on file   VTE Prophalyxis: lovenox   Disposition: continued admisison   Code status: Full Code  (Discussed on 06/24/2024)      Total Time Today was >35 minutes in the following activities: Preparing to see the patient, Obtaining and/or reviewing separately obtained history, Performing a medically appropriate examination and/or evaluation, Counseling and educating the patient/family/caregiver, Ordering medications, tests, or procedures, Referring and communication with other health care professionals (when not separately reported), Documenting clinical information in the electronic or other health record, and Independently interpreting results (not separately reported) and communicating results to the patient/family/caregiver                 Wound:      Wounds Skin tear Anterior;Left Knee (Active)   06/24/24 0620   Wound Type: Skin tear   Orientation: Anterior;Left   Location: Knee   Wound Location Comments: scab   Initial Wound Site Closure:    Initial Dressing Placed:    Initial Cycle:    Initial Suction Setting (mmHg):    Pressure Injury Stages:    Pressure Injury Present Within 24 Hours of Hospital Admission:    If This Pressure Injury Is Suspected to Be Device Related, Please Select the Device::    Is the Wound Open or Closed:    Wound Assessment Dressing not removed for assessment 06/28/24 2100   Peri-wound Assessment Dry;Intact 06/27/24 2000   Wound Drainage Amount None 06/28/24 2100   Wound Dressing Status Intact 06/28/24 2100   Wound Care Dressing changed or new application 06/26/24 2100   Wound Dressing and/or Treatment A & D ointment;[place]apore 06/28/24 2100   Number of days: 5                    _______________________________________________________________________    Subjective   Hailey Rodgers is a 72 y.o. female.  Pain still bothersome in back. Medication adjustments improved it overall. No other new symptoms.        Medications  Scheduled Meds:aspirin  chewable tablet 81 mg, 81 mg, Oral, QHS  calcium  carbonate (OS-CAL) tablet 1,250 mg, 1,250 mg, Oral, BID  celecoxib  (CeleBREX ) capsule 200 mg, 200 mg, Oral, QDAY  CHOLEcalciferoL  (vitamin D3) tablet 5,000 Units, 5,000 Units, Oral, Sundays  enoxaparin  (LOVENOX ) syringe 40 mg, 40 mg, Subcutaneous, QDAY(21)  famotidine  (PEPCID ) tablet 40 mg, 40 mg, Oral, QHS  [START ON 06/30/2024] ferrous sulfate  (FEOSOL) tablet 325 mg, 325 mg, Oral, Once per day on Monday Wednesday Friday  folic acid  (FOLVITE ) tablet 4 mg, 4 mg, Oral, QDAY  insulin  aspart (U-100) (NOVOLOG  FLEXPEN U-100 INSULIN ) injection PEN 0-12 Units, 0-12 Units, Subcutaneous, ACHS (22)  insulin  aspart (U-100) (NOVOLOG  FLEXPEN U-100 INSULIN ) injection PEN 15 Units, 15 Units, Subcutaneous, TID before meals  insulin  glargine (LANTUS  SOLOSTAR U-100 INSULIN ) injection PEN 15 Units, 15 Units, Subcutaneous, BID  levothyroxine  (SYNTHROID ) tablet 88 mcg, 88 mcg, Oral, QDAY  lidocaine  (LIDODERM ) 5 % topical patch 1 patch, 1 patch, Topical, QDAY  loratadine  (CLARITIN ) tablet 10 mg, 10 mg, Oral, QDAY  mirtazapine  (REMERON ) tablet 15 mg, 15 mg, Oral, QDAY  multivitamin (ONE-A-DAY) tablet 1 tablet, 1 tablet, Oral, QDAY(08)  pantoprazole  DR (PROTONIX ) tablet 40 mg, 40 mg, Oral, QDAY  polyethylene glycol 3350  (MIRALAX ) packet 17 g, 17 g, Oral, QDAY  pramipexole  (MIRAPEX ) tablet 0.75 mg, 0.75 mg, Oral, QHS  predniSONE  (DELTASONE ) tablet 5 mg, 5 mg, Oral, QDAY  pregabalin  (LYRICA ) capsule 50 mg, 50 mg, Oral, Q8H  rosuvastatin  (CRESTOR ) tablet 20 mg, 20 mg, Oral, QDAY  venlafaxine  XR (EFFEXOR  XR) capsule 225 mg, 225 mg, Oral, QDAY    Continuous Infusions:  PRN and Respiratory Meds:acetaminophen  Q4H PRN, albuterol  sulfate Q4H PRN, artificial tears (PF) single dose PRN, bisacodyL  QDAY PRN, dextrose  50% (D50) IV PRN, diclofenac  sodium QID PRN, fluticasone  propionate BID PRN, hydrocortisone  acetate BID PRN, loperamide  PRN, melatonin QHS PRN, methocarbamoL  TID PRN, nifedipine  0.3%/lidocaine  1.5%/white petrolatum (#) QID PRN, nitroglycerin  Q5 MIN PRN, oxyCODONE  Q6H PRN, sodium chloride  PRN        Objective                        Vital Signs: Last Filed                 Vital Signs: 24 Hour Range   BP: 118/57 (07/08 1220)  Temp: 36.6 ?C (97.9 ?F) (07/08 1220)  Pulse: 58 (07/08 1220)  Respirations: 14 PER MINUTE (07/08 1220)  SpO2: 96 % (07/08 1220)  O2 Device: None (Room air) (07/08 1220)  O2 Liter Flow: 2 Lpm (07/08 0725) BP: (97-136)/(39-75)   Temp:  [36.4 ?C (97.5 ?F)-36.8 ?C (98.2 ?F)]   Pulse:  [55-74]   Respirations:  [14 PER MINUTE-18 PER MINUTE]   SpO2:  [92 %-99 %]   O2 Device: None (Room air)  O2 Liter Flow: 2 Lpm   Intensity Pain Scale (Self Report): 3 (06/28/24 2100) Vitals:    06/26/24 0554 06/27/24 0354 06/28/24 0137 Weight: 82.5 kg (181 lb 14.1 oz) 83.1 kg (183 lb 3.2 oz) 78.5 kg (173 lb)         Intake/Output Summary:  (Last 24 hours)    Intake/Output Summary (Last 24 hours) at 06/29/2024 1238  Last data filed at 06/29/2024 1024  Gross per 24 hour   Intake 837 ml   Output --   Net 837 ml     Stool Occurrence: 0

## 2024-06-29 NOTE — Progress Notes
 Psychiatric Consultation Progress Note    LOS: 5 days    Current Psychotropic Meds:      Venlafaxine  XR 225mg   Mirtazapine  15mg      Psychiatric Assessment:    PDD with intermittent MDEs, current episode  GAD with panic  Trauma and stressor related disorder     Recommendations:    No psychotropic medication changes as of 06/29/24  Would consider decreasing/minimizing sedating medications curing the day given significant somnolence  Psychiatry will continue to follow      Seen and discussed with: Dr. Ana    ATTESTATION    I personally performed or re-performed the history, physical exam and treatment for the E/M. I discussed the case with the Medical Student, and concur with the Medical Student documentation of history, physical exam and treatment plan unless otherwise noted.     Patient seen today for f/up. Reports sleeping well overnight but feeling significantly somnolent today. Fell asleep multiple times during encounter and had to be woken up. Reports depression and anxiety are somewhat better and closer to baseline. Denies SI/HI/AVH.     Staff name:  Cordella CHRISTELLA Ana, DO Date: 06/29/2024       Please feel free to contact us  with any additional questions or concerns by paging the consult team between 8am and 5pm on weekdays and between 8am and 3pm on weekends at [phone]. Otherwise, page the psychiatry resident on call.    --------------------------------------------------------------------------------------------------------------------------------  Subjective:  PARTHENIA TELLEFSEN was seen for follow-up at bedside. Patient was asleep initially, and remained tired throughout the interview. Patient fell asleep multiple times and had to be awakened using physical and verbal stimuli. Stated she slept a whole lot which is unusual for her and described her mood as upset with myself for being so tired. Denies SI/HI. Denies AVH. Denies medication side effects. Pain appears to be well controlled but does state she feels dazed and drowsy from her medications.    Review of Systems   Respiratory:  Negative for shortness of breath.    Cardiovascular:  Negative for chest pain.   Neurological:  Negative for headaches.   Psychiatric/Behavioral:  Negative for hallucinations and suicidal ideas.         Objective:                   Vital Signs:  Current                Vital Signs: 24 Hour Range   BP: 121/61 (07/08 0725)  Temp: 36.6 ?C (97.8 ?F) (07/08 0725)  Pulse: 57 (07/08 0725)  Respirations: 16 PER MINUTE (07/08 0725)  SpO2: 99 % (07/08 0725)  O2 Device: Nasal cannula (07/08 0725)  O2 Liter Flow: 2 Lpm (07/08 0725) BP: (121-136)/(39-75)   Temp:  [36.4 ?C (97.5 ?F)-36.8 ?C (98.3 ?F)]   Pulse:  [55-74]   Respirations:  [16 PER MINUTE-18 PER MINUTE]   SpO2:  [92 %-99 %]   O2 Device: Nasal cannula  O2 Liter Flow: 2 Lpm    Intensity Pain Scale (Self Report): (not recorded)      Scheduled Medications:  aspirin  chewable tablet 81 mg, 81 mg, Oral, QHS  calcium  carbonate (OS-CAL) tablet 1,250 mg, 1,250 mg, Oral, BID  celecoxib  (CeleBREX ) capsule 200 mg, 200 mg, Oral, QDAY  CHOLEcalciferoL  (vitamin D3) tablet 5,000 Units, 5,000 Units, Oral, Sundays  enoxaparin  (LOVENOX ) syringe 40 mg, 40 mg, Subcutaneous, QDAY(21)  famotidine  (PEPCID ) tablet 40 mg, 40 mg, Oral, QHS  [START ON 06/30/2024]  ferrous sulfate  (FEOSOL) tablet 325 mg, 325 mg, Oral, Once per day on Monday Wednesday Friday  folic acid  (FOLVITE ) tablet 4 mg, 4 mg, Oral, QDAY  insulin  aspart (U-100) (NOVOLOG  FLEXPEN U-100 INSULIN ) injection PEN 0-12 Units, 0-12 Units, Subcutaneous, ACHS (22)  insulin  aspart (U-100) (NOVOLOG  FLEXPEN U-100 INSULIN ) injection PEN 15 Units, 15 Units, Subcutaneous, TID before meals  insulin  glargine (LANTUS  SOLOSTAR U-100 INSULIN ) injection PEN 15 Units, 15 Units, Subcutaneous, BID  levothyroxine  (SYNTHROID ) tablet 88 mcg, 88 mcg, Oral, QDAY  lidocaine  (LIDODERM ) 5 % topical patch 1 patch, 1 patch, Topical, QDAY  loratadine  (CLARITIN ) tablet 10 mg, 10 mg, Oral, QDAY  mirtazapine  (REMERON ) tablet 15 mg, 15 mg, Oral, QDAY  multivitamin (ONE-A-DAY) tablet 1 tablet, 1 tablet, Oral, QDAY(08)  pantoprazole  DR (PROTONIX ) tablet 40 mg, 40 mg, Oral, QDAY  polyethylene glycol 3350  (MIRALAX ) packet 17 g, 17 g, Oral, QDAY  pramipexole  (MIRAPEX ) tablet 0.75 mg, 0.75 mg, Oral, QHS  predniSONE  (DELTASONE ) tablet 5 mg, 5 mg, Oral, QDAY  pregabalin  (LYRICA ) capsule 50 mg, 50 mg, Oral, Q8H  rosuvastatin  (CRESTOR ) tablet 20 mg, 20 mg, Oral, QDAY  venlafaxine  XR (EFFEXOR  XR) capsule 225 mg, 225 mg, Oral, QDAY        PRN Medications:  acetaminophen  Q4H PRN 650 mg at 06/28/24 2124, albuterol  sulfate Q4H PRN, artificial tears (PF) single dose PRN 1 drop at 06/28/24 1212, bisacodyL  QDAY PRN, dextrose  50% (D50) IV PRN 25 mL at 06/26/24 1833, diclofenac  sodium QID PRN 4 g at 06/27/24 0849, fluticasone  propionate BID PRN, hydrocortisone  acetate BID PRN, loperamide  PRN, melatonin QHS PRN 5 mg at 06/28/24 2124, methocarbamoL  TID PRN 750 mg at 06/28/24 2124, nitroglycerin  Q5 MIN PRN, oxyCODONE  Q6H PRN 5 mg at 06/28/24 2124, sodium chloride  PRN      Mental Status Evaluation:   General/Constitutional:  73 y.o.  female, appears stated age, fair hygiene, fair grooming  Eye Contact: Poor. Patient kept closing her eyes and falling asleep.  Behavior: Calm, cooperative, no distress  Motor: No observed tics or tremors  Speech: RRR with normal tone and volume; good articulation  Mood: Upset with myself for being tired  Affect: Hypothymic, mood congruent  Thought Process: Linear and logical, goal directed  Thought Content: Denies SI, Denies HI; no evidence of delusions or paranoia  Perception: Denies AVH; does not appear to respond to internal stimuli  Insight/Judgment: Fair/Fair     Orientation: Grossly oriented  Recent and remote memory: Grossly intact  Attention span and concentration: Appropriate  Language: Appropriate  Fund of knowledge and vocabulary: Average    Focused Physical Exam:  Neuro: Grossly intact, no obvious focal neuro deficits, no tremors or dystonia noted  Musculoskeletal: Moves all extremities spontaneously        Sheree JAYSON Chi  MS3

## 2024-06-29 NOTE — Progress Notes
 OCCUPATIONAL THERAPY  ASSESSMENT NOTE      Name: Hailey Rodgers   MRN: [id number]     DOB: 12-21-1951      Age: 73 y.o.  Admission Date: 06/23/2024     LOS: 5 days     Date of Service: 06/29/2024      Mobility  Patient Turn/Position: Self  Mobility Level [facility] Highest Level of Mobility (JH-HLM): Walk 250 feet or more  Distance Walked (feet): 350 ft  Level of Assistance: Assist X1  Assistive Device: Walker  Activity Limited By: Fatigue;Pain    Subjective  Significant Hospital Events: 73 y.o.   female with rheumatoid arthritis on chronic pre,  OSA on BIPAP, chronic heart failure with preserved EF,  CAD , HTN, dyslipidemia, IDDM type 2, history of urinary retentions ,  history of b  T12-iliac decompression and fusion ~ 01/2024  presented to [place] ED on 06/23/24 for fever along with  acute on chronic back pain , diarrhea, foul urine/incontinence  Mental / Cognitive: Alert;Oriented;Cooperative  Pain: Complains of pain;Does not rate pain  Pain level: During activity  Pain Location: Back  Pain Description: Aching  Pain Interventions: Patient agrees to participate in therapy with current pain level;Patient assisted into position of comfort    Home Living Situation  Lives With: Spouse/significant other  Comments: Per patient, spouse not in good health after having a brain bleed, about 3 years ago. He is frequently incontinent and at times hallucinates.  Type of Home: Mobile home  Entry Stairs: Ramp  In-Home Stairs: No stairs  Bathroom Setup: Not accessible  Patient Owned Equipment: Cane: Single point;Walker: Rollator;Hospital bed;Commode: Standard  Comments: Patient says she is unable to use walker inside her trailer due to clutter so she uses a cane.    Prior Level of Function  Level Of Independence: Independent with ADL and household mobility with device  Comments: Reports hallways too narrow to use the rollator at home. Uses cane in the house  History of Falls in Past 3 Months: Yes  Comments: Lost balance while using cane.  Comments: Patient reports recent rehab stay. She was home for ~2 weeks before being readmitted.    ADL's  Where Assessed: Standing at [facility], [facility], [facility];In [employer] Assist: Stand By Assist  Grooming Deficits: Standing With Education administrator;Wash/Dry Hands  Toileting Assist: Stand By Assist  Toileting Deficits: Perineal Hygiene;Grab Bar Use  Comment: Pt completes peri care while standing at RW    ADL Mobility  Bed Mobility: Supine to Sit: Standby assist  Bed Mobility Comments: increased time needed for scooting to EOB; HOB elevated to approximately 30 degrees  Transfer Type: Sit to stand  Transfer: Assistance Level: From;Bed;Minimal assist (CGA)  Transfer: Assistive Device: Roller walker  Transfer: Type of Assistance: For balance;For safety considerations;For strength deficit  Other Transfer Type: Sit to/from stand  Other Transfer: Assistance Level: To/from;Toilet;Minimal assist (CGA)  Other Transfer: Assistive Device: Roller walker  Other Transfer: Type of Assistance: For balance;For safety considerations;For strength deficit  End of Activity Status: Up in chair;Nursing notified;Instructed patient to use call light;Instructed patient to request assist with mobility  Gait Distance: 350 feet  Gait: Assistance Level: Minimal assist (CGA)  Gait: Assistive Device: Roller walker  Gait Comments: Ambulates at slow but relatively steady pace in hallway. Reports mild increase in pain with mobility and weakness in BLE.    Activity Tolerance  Endurance: 3/5 Tolerates 25-30 Minutes Exercise w/Multiple Rests    Assessment  Assessment: Decreased Endurance;Decreased Self-Care Trans;Decreased High-Level ADLs  Prognosis: Good    AM-PAC 6 Clicks Daily Activity Inpatient  Putting on and taking off regular lower body clothes: A Lot  Bathing (Including washing, rinsing, drying): A Little  Toileting, which includes using toilet, bedpan, or urinal: A Little  Putting on and taking off regular upper body clothing: A Little  Taking care of personal grooming such as brushing teeth: None  Eating meals: None  Daily Activity Raw Score: 19  Standardized (T-scale) Score: 40.22    Plan  Progress: Progressing Toward Goals  OT Frequency: 2-3x/week  OT Plan for Next Visit: LB dressing; outcome measure    ADL Goals  Patient Will Perform All ADL's: w/ Stand By Assist    Functional Transfer Goals  Pt Will Perform All Functional Transfers: w/ Stand By Assist    OT Discharge Recommendations  Recommendation: Inpatient setting  Patient Currently Requires Physical Assist With: All mobility;All personal care ADLs;All home functioning ADLs    Therapist: Rocky Slain, MOTR/L  Date: 06/29/2024

## 2024-06-30 ENCOUNTER — Encounter: Admit: 2024-06-30 | Discharge: 2024-06-30 | Payer: MEDICARE

## 2024-06-30 LAB — POC GLUCOSE
~~LOC~~ BKR POC GLUCOSE: 138 mg/dL — ABNORMAL HIGH (ref 70–100)
~~LOC~~ BKR POC GLUCOSE: 106 mg/dL — ABNORMAL HIGH (ref 70–100)
~~LOC~~ BKR POC GLUCOSE: 161 mg/dL — ABNORMAL HIGH (ref 70–100)
~~LOC~~ BKR POC GLUCOSE: 185 mg/dL — ABNORMAL HIGH (ref 70–100)
~~LOC~~ BKR POC GLUCOSE: 210 mg/dL — ABNORMAL HIGH (ref 70–100)

## 2024-06-30 MED ORDER — MIRTAZAPINE 7.5 MG PO TAB
15 mg | Freq: Every evening | ORAL | 0 refills | 30.00000 days | Status: AC
Start: 2024-06-30 — End: ?
  Filled 2024-07-16: qty 30, 30d supply, fill #0

## 2024-06-30 MED ORDER — CELECOXIB 200 MG PO CAP
200 mg | Freq: Every day | ORAL | 0 refills | 30.00000 days | Status: AC
Start: 2024-06-30 — End: ?

## 2024-06-30 MED ORDER — PREDNISONE 5 MG PO TAB
5 mg | Freq: Every day | ORAL | 0 refills | 30.00000 days | Status: AC
Start: 2024-06-30 — End: ?
  Filled 2024-09-24: qty 30, 20d supply, fill #0

## 2024-06-30 MED ORDER — LANTUS SOLOSTAR U-100 INSULIN 100 UNIT/ML (3 ML) SC INPN
SUBCUTANEOUS | 0 refills | 68.00000 days | Status: AC
Start: 2024-06-30 — End: ?

## 2024-06-30 MED ORDER — METHOCARBAMOL 500 MG PO TAB
500 mg | Freq: Three times a day (TID) | ORAL | 0 refills | Status: DC | PRN
Start: 2024-06-30 — End: 2024-07-01

## 2024-06-30 MED ORDER — NIFEDIPINE 0.3%/LIDOCAINE 1.5%/WHITE PETROLATUM(#) 100G
TOPICAL | 0 refills | 30.00000 days | Status: AC
Start: 2024-06-30 — End: ?

## 2024-06-30 MED ORDER — SODIUM CHLORIDE 0.65 % NA SPRA
2 | NASAL | 0 refills | 30.00000 days | Status: AC | PRN
Start: 2024-06-30 — End: ?

## 2024-06-30 MED ORDER — PREGABALIN 25 MG PO CAP
25 mg | ORAL | 0 refills | Status: DC
Start: 2024-06-30 — End: 2024-07-01
  Administered 2024-06-30 – 2024-07-01 (×4): 25 mg via ORAL

## 2024-06-30 MED ORDER — INSULIN ASPART 100 UNIT/ML SC FLEXPEN
15 [IU] | Freq: Three times a day (TID) | SUBCUTANEOUS | 0 refills | 30.00000 days | Status: AC
Start: 2024-06-30 — End: ?
  Filled 2024-07-16: qty 15, 19d supply, fill #0

## 2024-06-30 NOTE — Progress Notes
 PHYSICAL THERAPY  PROGRESS NOTE          Name: Hailey Rodgers   MRN: [id number]     DOB: Dec 11, 1951      Age: 73 y.o.  Admission Date: 06/23/2024     LOS: 6 days     Date of Service: 06/30/2024        Mobility  Patient Turn/Position: Chair  Mobility Level [facility] Highest Level of Mobility (JH-HLM): Walk 250 feet or more  Distance Walked (feet): 350 ft  Level of Assistance: Assist X1  Assistive Device: Walker  Activity Limited By: Pain;Patient request to stop    Subjective  Reason for Admission and Past Medical Hx: History of rheumatoid arthritis on chronic pre,  OSA on BIPAP, chronic heart failure with preserved EF, CAD, HTN, dyslipidemia, IDDM type 2, history of urinary retentions, history of b  T12-iliac decompression and fusion ~ 01/2024  presented to [place] [facility] ED on 06/23/24 for fever along with  acute on chronic back pain, diarrhea, foul urine/incontinence.    Mental / Cognitive: Alert;Oriented;Cooperative  Pain: Does not rate pain  Pain level: During activity  Pain Location: Back  Pain Interventions: Patient agrees to participate in therapy with current pain level;Patient assisted into position of comfort    Home Living Situation  Lives With: Spouse/significant other  Comments: Per patient, spouse not in good health after having a brain bleed, about 3 years ago. He is frequently incontinent and at times hallucinates.  Type of Home: Mobile home  Entry Stairs: Ramp  In-Home Stairs: No stairs  Bathroom Setup: Not accessible  Patient Owned Equipment: Cane: Single point;Walker: Rollator;Hospital bed;Commode: Standard  Comments: Patient says she is unable to use walker inside her trailer due to clutter so she uses a cane.    Prior Level of Function  Level Of Independence: Independent with ADL and household mobility with device  Comments: Reports hallways too narrow to use the rollator at home. Uses cane in the house  History of Falls in Past 3 Months: Yes  Comments: Lost balance while using cane.  Comments: Patient reports recent rehab stay. She was home for ~2 weeks before being readmitted.    Bed Mobility/Transfer  Bed Mobility: Supine to Sit: Minimal Assist;Assist with Trunk  Comments: Patient reclined in bedside chair at end of therapy session. TABS alarm activated, call light within reach and RN notified.    Transfer Type: Sit to/from Stand  Transfer: Assistance Level: To/From;Bed;Minimal Assist  Transfer: Assistive Device: Nurse, adult  Transfers: Type Of Assistance: Materials engineer;For Balance;For Strength Deficit;For Safety Considerations    End Of Activity Status: Up in Chair;Nursing Notified;Instructed Patient to Request Assist with Mobility;Instructed Patient to Use Call Light    Gait  Gait Distance: 350 feet  Gait: Assistance Level: Minimal Assist;Safety Considerations  Gait: Assistive Device: Roller Walker  Gait: Descriptors: Decreased heel strike LLE;Pace: Slow;No balance loss  Activity Limited By: Complaint of Pain    Education  Persons Educated: Patient  Patient Barriers To Learning: Pain  Interventions: Repetition of Instructions;Demonstration Provided  Teaching Methods: Verbal Instruction;Demonstration  Patient Response: Verbalized Understanding;Return Demonstration;More Instruction Required  Topics: Plan/Goals of PT Interventions;Use of Assistive Device/Orthosis;Mobility Progression;Safety Awareness;Up with Assist Only;Importance of Increasing Activity;Recommend Continued Therapy    Assessment/Progress  Assessment/Progress: Should Improve w/ Continued PT    AM-PAC 6 Clicks Basic Mobility Inpatient  Turning from your back to your side while in a flat bed without using bed rails: A Little  Moving from lying on your back to  sitting on the side of a flat bed without using bedrails : A Little  Moving to and from a bed to a chair (including a wheelchair): A Little  Standing up from a chair using your arms (e.g. wheelchair, or bedside chair): A Little  To walk in hospital room: A Little  Climbing 3-5 steps with a railing: A Little  Basic Mobility Inpatient Raw Score: 18  Standardized (T-scale) Score: 41.05  AM-PAC Basic Mobility Functional Stage: 34-51 Limited Mobility Indoors  Mobility Goal [facility]: JH-HLM 6 Walk >= 10 steps    Goals  Goal Formulation: With Patient  Time For Goal Achievement: 3 days, To, 7 days  Patient Will Go Supine To/From Sit: w/ Stand By Assist  Patient Will Logroll: w/ Stand By Assist  Patient Will Transfer Bed/Chair: w/ Stand By Assist  Patient Will Transfer Sit to Stand: w/ Stand By Assist  Patient Will Ambulate: Greater than 200 Feet, w/ Vannie, w/ Stand By Assist    Plan  Treatment Interventions: Mobility training  Plan Frequency: 3-5 Days per Week  PT Plan for Next Visit: Ongoing core strengthening and emphasis on optimizing upright posture. Progress gait distance/quality with ongoing use of the roller walker. Repeated sit-to-stands.    PT Discharge Recommendations  Recommendation: Inpatient setting    Therapist: Rocky Hasten, [place], [place] [address]  Date: 06/30/2024

## 2024-06-30 NOTE — Progress Notes
 Chaplain Note:    Admit Date: 06/23/2024     On the floor rounding and responding to Hailey Rodgers's request for a chaplain visit. I tried twice to see her, once a provider was in the room and she asked me to come back. The second time, she requested to eat her breakfast before we visit. I let her know I'd try again later in the day if she was up for a visit. She agreed later would be better.     The On-Call Chaplain is available on Voalte or can be paged via the switchboard [phone]) for urgent and emergent needs. The Spiritual Care team responds to other requests within 24-hours when submitted as a Chaplain Consult in O2.        Date/Time:                      User:                                      06/30/2024 9:20 AM Emmert Roethler      PCU 1 PCU

## 2024-06-30 NOTE — Progress Notes
 General Progress Note    Name: Hailey Rodgers        MRN: [id number]          DOB: 1951-04-26            Age: 73 y.o.  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   female with rheumatoid arthritis on chronic pre,  OSA on BIPAP, chronic heart failure with preserved EF,  CAD , HTN, dyslipidemia, IDDM type 2, history of urinary retentions ,  history of b  T12-iliac decompression and fusion ~ 01/2024  presented to [place] ED on 06/23/24 for fever along with  acute on chronic back pain , diarrhea, foul urine/incontinence    Fever secondary to UTI due to E coli - resolved  History of urinary retention s/p multiple foley in the past  - UA:p  positive nitrite, 3+ leukocytes, packed WBCs            - 2-3 days dysuria and fever PTA  Plan:  > Obtain bladder PRN now. Retention improved. Monitor.    > s/p ceftriaxone  ( 7/3 - 7/7) with resolution of urinary symptoms     Fluid and gas within T12 vertebral body, lucency and T11. Hx of T12 iliac decompression/fusion on 01/2024  Acute on chronic back pain   - CT ABD/PELV W CONTRAST7/02/2024  1.  Prior laminectomies and posterior instrumented fusion from T12 through the sacrum with superior endplate compression deformities of T12 and L2 with mild vertebral body height loss, previously described on outside imaging 05/12/2024 (this imaging is not available for review). 2.  Fluid and gas within the T12 vertebral body with sclerosis throughout the vertebral body and partially marginated scalloped lucency of the adjacent inferior endplate of T11. These findings may be entirely related to a combination of postsurgical changes, recent fracture, and periprosthetic loosening/mechanical factors. However, superimposed discitis/osteomyelitis is a diagnostic consideration. Dedicated MRI of the thoracic and lumbar spine with contrast is recommended for further evaluation in the absence of recent comparative films to determine stability. 3.  Circumferential thickening of the urinary bladder wall consistent with cystitis given urinalysis results. F  - OSH 05/12/24 MRI T spine: Numbering scheme is based on the lumbar spine with the   wedging deformity and signal abnormalities at T12 noted as discussed in  the CT and MR lumbar spine.  There is some mild to moderate diffuse thoracic spondylosis with no  additional compression deformity seen. Moderate posterior ridging and  spurring is noted with mild to moderate canal narrowing as discussed above. No intrinsic cord signal abnormalities or additional  enhancing abnormalities are noted.   - osh 05/12/24 MRI L spine: moderate periimplant lucency surrounding the bilateral T12 pedicle   screws demonstrated on recent CT is associated with diffuse marrow edema throughout the entirety of the vertebral body, anterior superior  endplate fracture, and fluid-filled fracture cleft. The marrow signal  abnormality could be secondary to mechanical loosening and fracture.   - MRI spine : 1.  Long segment spinal posterior instrumentation spanning T12 through the   sacroiliac joints. Periprosthetic fracture of T12 with pedicle screw   loosening more completely assessed on same-day CT abdomen and pelvis.   2.  Scalloped inferior endplate height loss of T11 with mild enhancing   marrow edema of the T11 vertebral body and fluid within the T12 fracture   plane likely represents benign edema secondary to compression injuries,   junctional mechanical factors, and reactive osteitis. Superinfection is   less likely, particularly given absent secondary paraspinous or epidural   inflammatory findings.   Plan  > Continue PTA oxycodone  PRN , pregabalin  25 mg TID, robaxin  500 TID PRN , tylenol  PRN, lidocaine  patch   - Patient previously had discontinued pregabalin  but found it helpful for her chronic back pain, had reduced dose to 25 mg TID initially, tolerated. Increased to 50 mg TID 7/7  - PT / OT consulted, recommending inpt. Rehab medicine consult   - placement pendig         Diarrhea(non bloody)- resolved  - obtain C-diff PCR, stool culture, giardia , crypto - negative. Brief diarrhea for 2 days on the day prior and of admission, since resolved   - Diarrhea improving 7/4, then constiapted. Can use PEG BID PRN, senna PRN      HLD  Chronic heart failure with preserved EF  OSA on BIPAP  - No oxygen at baseline                > Continue PTA aspirin                 > Continue PTA Crestor                 > Continue to hold bumex  and spirolactone for now. Daughter reports patient's bumex  and spirolactone has been HOLD since 02/2024 hospitalization. 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 This is restarted 3 weeks PTA with last dose on Sunday  06/20/24, likely can skip 1 week due to infection then resume      Hypothyroidism -continue PTA synthroid   GERD - continue PTA PPI    Suicidal ideation  MDD  - reports chronic SI, significant past trauma, chronic pain. Adamant that she isn't actually going to do it and says things like that sometimes. Reportedly had a near attempt ~ 6 months prior, was going to take pills, but when her daughter said she was going to report her SI, she threw the pills at daughter. [place] access [place] medications and means of [place] and says the she could have if she really wanted to but isnt going to  , psych consulted    - mirtazapine  15 qhs    - Continue venlafaxine  225   - DC CO   - outpatient follow up     Hx Iron  Def  - Taking BID PO iron  but sometimes constipated. Changed to 3xW    Hx PNES  - no signs of seizures on prior workup. CTM         FEN: no IVF, electrolytes reviewed, No diet orders on file   VTE Prophalyxis: lovenox   Disposition: continued admisison   Code status: Full Code  (Discussed on 06/24/2024)      Total Time Today was >35 minutes in the following activities: Preparing to see the patient, Obtaining and/or reviewing separately obtained history, Performing a medically appropriate examination and/or evaluation, Counseling and educating the patient/family/caregiver, Ordering medications, tests, or procedures, Referring and communication with other health care professionals (when not separately reported), Documenting clinical information in the electronic or other health record, and Independently interpreting results (not separately reported) and communicating results to the patient/family/caregiver, discharge planning                  Wound:      Wounds Skin tear Anterior;Left Knee (Active)   06/24/24 0620   Wound Type: Skin tear   Orientation: Anterior;Left   Location: Knee   Wound Location Comments: scab   Initial Wound Site Closure:    Initial Dressing Placed:    Initial Cycle:    Initial Suction Setting (mmHg):    Pressure Injury Stages:    Pressure Injury Present Within 24 Hours of Hospital Admission:    If This Pressure Injury Is Suspected to Be Device Related, Please Select the Device::    Is the Wound Open or Closed:    Wound Assessment Pink 06/30/24 0900   Peri-wound Assessment Dry 06/30/24 0900   Wound Drainage Amount None 06/30/24 0900   Wound Dressing Status None/open to air 06/30/24 0900   Wound Care Dressing changed or new application 06/26/24 2100   Wound Dressing and/or Treatment A & D ointment;Primapore 06/29/24 2043   Number of days: 6                    _______________________________________________________________________    Subjective   Hailey Rodgers is a 73 y.o. female.  Pain still bothersome in back. Medication adjustments improved it overall. No other new symptoms.        Medications  Scheduled Meds:aspirin   chewable tablet 81 mg, 81 mg, Oral, QHS  calcium  carbonate (OS-CAL) tablet 1,250 mg, 1,250 mg, Oral, BID  celecoxib  (CeleBREX ) capsule 200 mg, 200 mg, Oral, QDAY  CHOLEcalciferoL  (vitamin D3) tablet 5,000 Units, 5,000 Units, Oral, Sundays  enoxaparin  (LOVENOX ) syringe 40 mg, 40 mg, Subcutaneous, QDAY(21)  famotidine  (PEPCID ) tablet 40 mg, 40 mg, Oral, QHS  ferrous sulfate  (FEOSOL) tablet 325 mg, 325 mg, Oral, Once per day on Monday Wednesday Friday  folic acid  (FOLVITE ) tablet 4 mg, 4 mg, Oral, QDAY  insulin  aspart (U-100) (NOVOLOG  FLEXPEN U-100 INSULIN ) injection PEN 0-12 Units, 0-12 Units, Subcutaneous, ACHS (22)  insulin  aspart (U-100) (NOVOLOG  FLEXPEN U-100 INSULIN ) injection PEN 15 Units, 15 Units, Subcutaneous, TID before meals  insulin  glargine (LANTUS  SOLOSTAR U-100 INSULIN ) injection PEN 15 Units, 15 Units, Subcutaneous, BID  levothyroxine  (SYNTHROID ) tablet 88 mcg, 88 mcg, Oral, QDAY  lidocaine  (LIDODERM ) 5 % topical patch 1 patch, 1 patch, Topical, QDAY  loratadine  (CLARITIN ) tablet 10 mg, 10 mg, Oral, QDAY  mirtazapine  (REMERON ) tablet 15 mg, 15 mg, Oral, QDAY  multivitamin (ONE-A-DAY) tablet 1 tablet, 1 tablet, Oral, QDAY(08)  pantoprazole  DR (PROTONIX ) tablet 40 mg, 40 mg, Oral, QDAY  polyethylene glycol 3350  (MIRALAX ) packet 17 g, 17 g, Oral, QDAY  pramipexole  (MIRAPEX ) tablet 0.75 mg, 0.75 mg, Oral, QHS  predniSONE  (DELTASONE ) tablet 5 mg, 5 mg, Oral, QDAY  pregabalin  (LYRICA ) capsule 25 mg, 25 mg, Oral, Q8H  rosuvastatin  (CRESTOR ) tablet 20 mg, 20 mg, Oral, QDAY  venlafaxine  XR (EFFEXOR  XR) capsule 225 mg, 225 mg, Oral, QDAY    Continuous Infusions:  PRN and Respiratory Meds:acetaminophen  Q4H PRN, albuterol  sulfate Q4H PRN, artificial tears (PF) single dose PRN, bisacodyL  QDAY PRN, dextrose  50% (D50) IV PRN, diclofenac  sodium QID PRN, fluticasone  propionate BID PRN, hydrocortisone  acetate BID PRN, loperamide  PRN, melatonin QHS PRN, methocarbamoL  TID PRN, nifedipine  0.3%/lidocaine  1.5%/white petrolatum (#) QID PRN, nitroglycerin  Q5 MIN PRN, oxyCODONE  Q6H PRN, sodium chloride  PRN        Objective                        Vital Signs: Last Filed                 Vital Signs: 24 Hour Range   BP: 109/51 (07/09 1111)  Temp: 36.3 ?C (97.4 ?F) (07/09 1111)  Pulse: 64 (07/09 1111)  Respirations: 20 PER MINUTE (07/09 1111)  SpO2: 96 % (07/09 1111)  O2 Device: None (Room air) (07/09 1111) BP: (109-136)/(51-63)   Temp:  [36.3 ?C (97.4 ?F)-36.7 ?C (98 ?F)]   Pulse:  [62-72]   Respirations:  [16 PER MINUTE-20 PER MINUTE]   SpO2:  [96 %-97 %]   O2 Device: None (Room air)   Intensity Pain Scale (Self Report): 10 (06/30/24 9180) Vitals:    06/26/24 0554 06/27/24 0354 06/28/24 0137   Weight: 82.5 kg (181 lb 14.1 oz) 83.1 kg (183 lb 3.2 oz) 78.5 kg (173 lb)         Intake/Output Summary:  (Last 24 hours)    Intake/Output Summary (Last 24 hours) at 06/30/2024 1422  Last data filed at 06/30/2024 1404  Gross per 24 hour   Intake 1860 ml   Output 600 ml   Net 1260 ml     Stool Occurrence: 0

## 2024-06-30 NOTE — Case Management (ED)
 Case Management Progress Note    NAME:Hailey Rodgers                          MRN: [id number]              DOB:07-16-1951          AGE: 73 y.o.  ADMISSION DATE: 06/23/2024             DAYS ADMITTED: LOS: 6 days      Today's Date: 06/30/2024    PLAN: Discharge to IPR pending pt/family decision.    Expected Discharge Date: 06/30/2024   Is Patient Medically Stable: Yes   Are there Barriers to Discharge? (Pt/family choice)    INTERVENTION/DISPOSITION:  Discharge Planning              Discharge Planning: Inpatient Rehabilitation  SW reviewed EMR & participated in [facility] huddle. Pt is medically stable for progression of care.  PT/OT continue with recommendation of inpatient setting.  SW followed up on the following pending referrals:   [facility] - Denied  [facility] - Denied   [facility] - Denied  [facility] - Accepted. Spoke with [place] [phone])  [facility] - Accepted  [facility] - Accepted  0900: SW communicated with pt's daughter, [name] [phone]) and provided update. She is going to tour the three accepting facilities today. SW let her know pt is stable for d/c and requested a decision be made today so pt can transfer to IPR.   1600: SW checked in with [place] throughout the day. Currently she's still out touring. She will let SW know her decision by the end of the day.  SW will continue to follow.   Transportation              Does the Patient Need Case Management to Arrange Discharge Transport? (ex: facility, ambulance, wheelchair/stretcher, Medicaid, cab, other): Yes  Type of Transport: Wheelchair fleeta  Will the Patient Use Family Transport?: No  Transportation Name, Phone and Availability #1: daughter [name]  Support              Support: Pt/Family Updates re:POC or DC Plan, Huddle/team update  Info or Referral                 Positive SDOH Domains and Potential Barriers     Medication Needs    Financial                 Legal                 Other                 Discharge Disposition    Selected Continued Care - Admitted Since 06/23/2024        Home Care Coordination complete.      Service Provider Services Address Phone Fax Patient Preferred    [facility] [address], [place] [address] [phone] --                  Eleanor Copa, LMSW  Available on Voalte  Work Cell: [phone]

## 2024-07-01 ENCOUNTER — Encounter: Admit: 2024-07-01 | Discharge: 2024-07-01 | Payer: MEDICARE

## 2024-07-01 ENCOUNTER — Inpatient Hospital Stay: Admit: 2024-06-24 | Payer: MEDICARE

## 2024-07-01 DIAGNOSIS — Z833 Family history of diabetes mellitus: Secondary | ICD-10-CM

## 2024-07-01 DIAGNOSIS — Z79631 Long term (current) use of antimetabolite agent: Secondary | ICD-10-CM

## 2024-07-01 DIAGNOSIS — F419 Anxiety disorder, unspecified: Secondary | ICD-10-CM

## 2024-07-01 DIAGNOSIS — Z91048 Other nonmedicinal substance allergy status: Secondary | ICD-10-CM

## 2024-07-01 DIAGNOSIS — K760 Fatty (change of) liver, not elsewhere classified: Secondary | ICD-10-CM

## 2024-07-01 DIAGNOSIS — F445 Conversion disorder with seizures or convulsions: Secondary | ICD-10-CM

## 2024-07-01 DIAGNOSIS — M47814 Spondylosis without myelopathy or radiculopathy, thoracic region: Secondary | ICD-10-CM

## 2024-07-01 DIAGNOSIS — I110 Hypertensive heart disease with heart failure: Secondary | ICD-10-CM

## 2024-07-01 DIAGNOSIS — D84821 Immunodeficiency due to drugs: Secondary | ICD-10-CM

## 2024-07-01 DIAGNOSIS — Z7982 Long term (current) use of aspirin: Secondary | ICD-10-CM

## 2024-07-01 DIAGNOSIS — Z8616 Personal history of COVID-19: Secondary | ICD-10-CM

## 2024-07-01 DIAGNOSIS — Z823 Family history of stroke: Secondary | ICD-10-CM

## 2024-07-01 DIAGNOSIS — Z9049 Acquired absence of other specified parts of digestive tract: Secondary | ICD-10-CM

## 2024-07-01 DIAGNOSIS — E11649 Type 2 diabetes mellitus with hypoglycemia without coma: Secondary | ICD-10-CM

## 2024-07-01 DIAGNOSIS — M48062 Spinal stenosis, lumbar region with neurogenic claudication: Secondary | ICD-10-CM

## 2024-07-01 DIAGNOSIS — E034 Atrophy of thyroid (acquired): Secondary | ICD-10-CM

## 2024-07-01 DIAGNOSIS — E669 Obesity, unspecified: Secondary | ICD-10-CM

## 2024-07-01 DIAGNOSIS — K219 Gastro-esophageal reflux disease without esophagitis: Secondary | ICD-10-CM

## 2024-07-01 DIAGNOSIS — I2510 Atherosclerotic heart disease of native coronary artery without angina pectoris: Secondary | ICD-10-CM

## 2024-07-01 DIAGNOSIS — G4733 Obstructive sleep apnea (adult) (pediatric): Secondary | ICD-10-CM

## 2024-07-01 DIAGNOSIS — Z7984 Long term (current) use of oral hypoglycemic drugs: Secondary | ICD-10-CM

## 2024-07-01 DIAGNOSIS — R000 Tachycardia, unspecified: Secondary | ICD-10-CM

## 2024-07-01 DIAGNOSIS — Z79899 Other long term (current) drug therapy: Secondary | ICD-10-CM

## 2024-07-01 DIAGNOSIS — M5430 Sciatica, unspecified side: Secondary | ICD-10-CM

## 2024-07-01 DIAGNOSIS — Z91040 Latex allergy status: Secondary | ICD-10-CM

## 2024-07-01 DIAGNOSIS — G2581 Restless legs syndrome: Secondary | ICD-10-CM

## 2024-07-01 DIAGNOSIS — F329 Major depressive disorder, single episode, unspecified: Secondary | ICD-10-CM

## 2024-07-01 DIAGNOSIS — Z9181 History of falling: Secondary | ICD-10-CM

## 2024-07-01 DIAGNOSIS — Z7985 Long-term (current) use of injectable non-insulin antidiabetic drugs: Secondary | ICD-10-CM

## 2024-07-01 DIAGNOSIS — N3000 Acute cystitis without hematuria: Principal | ICD-10-CM

## 2024-07-01 DIAGNOSIS — M069 Rheumatoid arthritis, unspecified: Secondary | ICD-10-CM

## 2024-07-01 DIAGNOSIS — I5032 Chronic diastolic (congestive) heart failure: Secondary | ICD-10-CM

## 2024-07-01 DIAGNOSIS — G8929 Other chronic pain: Secondary | ICD-10-CM

## 2024-07-01 DIAGNOSIS — F439 Reaction to severe stress, unspecified: Secondary | ICD-10-CM

## 2024-07-01 DIAGNOSIS — K5900 Constipation, unspecified: Secondary | ICD-10-CM

## 2024-07-01 DIAGNOSIS — Z800 Family history of malignant neoplasm of digestive organs: Secondary | ICD-10-CM

## 2024-07-01 DIAGNOSIS — Z881 Allergy status to other antibiotic agents status: Secondary | ICD-10-CM

## 2024-07-01 DIAGNOSIS — J45909 Unspecified asthma, uncomplicated: Secondary | ICD-10-CM

## 2024-07-01 DIAGNOSIS — F848 Other pervasive developmental disorders: Secondary | ICD-10-CM

## 2024-07-01 DIAGNOSIS — Z8349 Family history of other endocrine, nutritional and metabolic diseases: Secondary | ICD-10-CM

## 2024-07-01 DIAGNOSIS — Z880 Allergy status to penicillin: Secondary | ICD-10-CM

## 2024-07-01 DIAGNOSIS — Z7409 Other reduced mobility: Secondary | ICD-10-CM

## 2024-07-01 DIAGNOSIS — F410 Panic disorder [episodic paroxysmal anxiety] without agoraphobia: Secondary | ICD-10-CM

## 2024-07-01 DIAGNOSIS — R197 Diarrhea, unspecified: Secondary | ICD-10-CM

## 2024-07-01 DIAGNOSIS — Z79890 Hormone replacement therapy (postmenopausal): Secondary | ICD-10-CM

## 2024-07-01 DIAGNOSIS — E7849 Other hyperlipidemia: Secondary | ICD-10-CM

## 2024-07-01 DIAGNOSIS — Z888 Allergy status to other drugs, medicaments and biological substances status: Secondary | ICD-10-CM

## 2024-07-01 DIAGNOSIS — Z8249 Family history of ischemic heart disease and other diseases of the circulatory system: Secondary | ICD-10-CM

## 2024-07-01 DIAGNOSIS — Z90710 Acquired absence of both cervix and uterus: Secondary | ICD-10-CM

## 2024-07-01 DIAGNOSIS — Z981 Arthrodesis status: Secondary | ICD-10-CM

## 2024-07-01 DIAGNOSIS — Z885 Allergy status to narcotic agent status: Secondary | ICD-10-CM

## 2024-07-01 DIAGNOSIS — B9620 Unspecified Escherichia coli [E. coli] as the cause of diseases classified elsewhere: Secondary | ICD-10-CM

## 2024-07-01 DIAGNOSIS — Z87440 Personal history of urinary (tract) infections: Secondary | ICD-10-CM

## 2024-07-01 DIAGNOSIS — F411 Generalized anxiety disorder: Secondary | ICD-10-CM

## 2024-07-01 DIAGNOSIS — Z809 Family history of malignant neoplasm, unspecified: Secondary | ICD-10-CM

## 2024-07-01 DIAGNOSIS — Z794 Long term (current) use of insulin: Secondary | ICD-10-CM

## 2024-07-01 DIAGNOSIS — R45851 Suicidal ideations: Secondary | ICD-10-CM

## 2024-07-01 LAB — POC GLUCOSE
~~LOC~~ BKR POC GLUCOSE: 194 mg/dL — ABNORMAL HIGH (ref 70–100)
~~LOC~~ BKR POC GLUCOSE: 115 mg/dL — ABNORMAL HIGH (ref 70–100)
~~LOC~~ BKR POC GLUCOSE: 147 mg/dL — ABNORMAL HIGH (ref 70–100)
~~LOC~~ BKR POC GLUCOSE: 155 mg/dL — ABNORMAL HIGH (ref 70–100)

## 2024-07-01 MED ORDER — OXYCODONE 5 MG PO TAB
5 mg | ORAL | 0 refills | 6.00000 days | Status: AC | PRN
Start: 2024-07-01 — End: ?
  Filled 2024-07-16: qty 30, 8d supply, fill #0

## 2024-07-01 MED ORDER — METHOCARBAMOL 500 MG PO TAB
250 mg | Freq: Three times a day (TID) | ORAL | 0 refills | Status: DC | PRN
Start: 2024-07-01 — End: 2024-07-01

## 2024-07-01 MED ORDER — MIRTAZAPINE 15 MG PO TAB
15 mg | Freq: Every evening | ORAL | 0 refills | Status: DC
Start: 2024-07-01 — End: 2024-07-01

## 2024-07-01 MED ORDER — FERROUS SULFATE 325 MG (65 MG IRON) PO TAB
325 mg | ORAL | 0 refills | 30.00000 days | Status: AC
Start: 2024-07-01 — End: ?

## 2024-07-01 MED FILL — TRULICITY 1.5 MG/0.5 ML SC PNIJ: 1.5 | SUBCUTANEOUS | 28 days supply | Qty: 2 | Fill #0 | Status: CP

## 2024-07-01 NOTE — Progress Notes
 Psychiatric Consultation Progress Note    LOS: 7 days    Current Psychotropic Meds:      Venlafaxine  XR 225mg   Mirtazapine  15mg      Psychiatric Assessment:     PDD with intermittent MDEs, current episode  GAD with panic  Trauma and stressor related disorder     Recommendations:     No psychotropic medication changes as of 07/01/24  Primary changed mirtazapine  from QAM to QHS dosing today given ongoing sedation during daytime, agree with change and would ensure medication is administered QHS and not during the day at discharge      Seen and discussed with: Dr. Ana    ATTESTATION    I personally performed or re-performed the history, physical exam and treatment for the E/M. I discussed the case with the Medical Student, and concur with the Medical Student documentation of history, physical exam and treatment plan unless otherwise noted.     Staff name:  Cordella CHRISTELLA Ana, DO Date: 07/01/2024       Please feel free to contact us  with any additional questions or concerns by paging the consult team between 8am and 5pm on weekdays and between 8am and 3pm on weekends at [phone]. Otherwise, page the psychiatry resident on call.    --------------------------------------------------------------------------------------------------------------------------------  Subjective:  Hailey Rodgers was seen for follow-up at bedside. Patient was alert and oriented. Reported not feeling drowsy since she has not taken oxycodone  for her pain. Patient was reminded she still as the oxycodone  available PRN if the pain worsens. Patient reported sleeping good, waking up around 3 times but able to fall asleep soon after. Reports her appetite is alright. States her anxiety is 5/10 (10 being the worst) due to her social stressors. Reports her depression is a 3/10 and has markedly improved. States she got over all of that. Denies medication side effects. Denies SI/HI. Denies AVH.    Patient was seen during rounds with attending and was very sedated. Patient had to be awakened multiple times throughout the interview using physical and verbal stimuli. Chart reviewed showed that Mirtazapine  was being given in the mornings since being increased to 15mg . The schedule was switched to [facility] today by primary.      Review of Systems   Respiratory:  Negative for shortness of breath.    Cardiovascular:  Negative for chest pain.   Gastrointestinal:  Negative for constipation, diarrhea, nausea and vomiting.   Neurological:  Negative for dizziness and headaches.        Objective:                   Vital Signs:  Current                Vital Signs: 24 Hour Range   BP: 136/80 (07/10 0849)  Temp: 36.6 ?C (97.9 ?F) (07/10 9150)  Pulse: 60 (07/10 0849)  Respirations: 18 PER MINUTE (07/10 0849)  SpO2: 96 % (07/10 0849)  O2 Device: None (Room air) (07/10 0849) BP: (109-154)/(51-80)   Temp:  [36.3 ?C (97.4 ?F)-36.8 ?C (98.3 ?F)]   Pulse:  [60-81]   Respirations:  [16 PER MINUTE-20 PER MINUTE]   SpO2:  [96 %-98 %]   O2 Device: None (Room air)    Intensity Pain Scale (Self Report): (not recorded)      Scheduled Medications:  aspirin  chewable tablet 81 mg, 81 mg, Oral, QHS  calcium  carbonate (OS-CAL) tablet 1,250 mg, 1,250 mg, Oral, BID  celecoxib  (CeleBREX ) capsule 200 mg, 200  mg, Oral, QDAY  CHOLEcalciferoL  (vitamin D3) tablet 5,000 Units, 5,000 Units, Oral, Sundays  enoxaparin  (LOVENOX ) syringe 40 mg, 40 mg, Subcutaneous, QDAY(21)  famotidine  (PEPCID ) tablet 40 mg, 40 mg, Oral, QHS  ferrous sulfate  (FEOSOL) tablet 325 mg, 325 mg, Oral, Once per day on Monday Wednesday Friday  folic acid  (FOLVITE ) tablet 4 mg, 4 mg, Oral, QDAY  insulin  aspart (U-100) (NOVOLOG  FLEXPEN U-100 INSULIN ) injection PEN 0-12 Units, 0-12 Units, Subcutaneous, ACHS (22)  insulin  aspart (U-100) (NOVOLOG  FLEXPEN U-100 INSULIN ) injection PEN 15 Units, 15 Units, Subcutaneous, TID before meals  insulin  glargine (LANTUS  SOLOSTAR U-100 INSULIN ) injection PEN 15 Units, 15 Units, Subcutaneous, BID  levothyroxine  (SYNTHROID ) tablet 88 mcg, 88 mcg, Oral, QDAY  lidocaine  (LIDODERM ) 5 % topical patch 1 patch, 1 patch, Topical, QDAY  loratadine  (CLARITIN ) tablet 10 mg, 10 mg, Oral, QDAY  mirtazapine  (REMERON ) tablet 15 mg, 15 mg, Oral, QDAY  multivitamin (ONE-A-DAY) tablet 1 tablet, 1 tablet, Oral, QDAY(08)  pantoprazole  DR (PROTONIX ) tablet 40 mg, 40 mg, Oral, QDAY  polyethylene glycol 3350  (MIRALAX ) packet 17 g, 17 g, Oral, QDAY  pramipexole  (MIRAPEX ) tablet 0.75 mg, 0.75 mg, Oral, QHS  predniSONE  (DELTASONE ) tablet 5 mg, 5 mg, Oral, QDAY  pregabalin  (LYRICA ) capsule 25 mg, 25 mg, Oral, Q8H  rosuvastatin  (CRESTOR ) tablet 20 mg, 20 mg, Oral, QDAY  venlafaxine  XR (EFFEXOR  XR) capsule 225 mg, 225 mg, Oral, QDAY        PRN Medications:  acetaminophen  Q4H PRN 650 mg at 07/01/24 0209, albuterol  sulfate Q4H PRN, artificial tears (PF) single dose PRN 1 drop at 06/28/24 1212, bisacodyL  QDAY PRN, dextrose  50% (D50) IV PRN 25 mL at 06/26/24 1833, diclofenac  sodium QID PRN 4 g at 06/27/24 0849, fluticasone  propionate BID PRN, hydrocortisone  acetate BID PRN, loperamide  PRN, melatonin QHS PRN 5 mg at 06/30/24 2027, methocarbamoL  TID PRN, nifedipine  0.3%/lidocaine  1.5%/white petrolatum (#) QID PRN, nitroglycerin  Q5 MIN PRN, oxyCODONE  Q6H PRN 5 mg at 06/28/24 2124, sodium chloride  PRN      Mental Status Evaluation:   General/Constitutional:  73 y.o.  female, appears stated age, fair hygiene, fair grooming  Eye Contact: Good  Behavior: Calm, cooperative, no distress  Motor: No observed tics or tremors  Speech: RRR with normal tone and volume; good articulation  Mood: Okay  Affect: Euthymic, mood congruent  Thought Process: Linear and logical, goal directed  Thought Content: Denies SI, Denies HI; no evidence of delusions or paranoia  Perception: Denies AVH; does not appear to respond to internal stimuli  Insight/Judgment: Fair/Fair     Orientation: Grossly oriented  Recent and remote memory: Grossly intact  Attention span and concentration: Appropriate  Language: Appropriate  Fund of knowledge and vocabulary: Average    Focused Physical Exam:  Neuro: Grossly intact, no obvious focal neuro deficits, no tremors or dystonia noted  Musculoskeletal: Moves all extremities spontaneously        Sheree BROCKS Fierro-Cortes  MS3

## 2024-07-01 NOTE — Case Management (ED)
 Case Management Progress Note    NAME:Hailey Rodgers                          MRN: [id number]              DOB:Apr 29, 1951          AGE: 73 y.o.  ADMISSION DATE: 06/23/2024             DAYS ADMITTED: LOS: 7 days      Today's Date: 07/01/2024    PLAN: DC to [facility] today at 1pm    Expected Discharge Date: 06/30/2024   Is Patient Medically Stable: Yes   Are there Barriers to Discharge? no    INTERVENTION/DISPOSITION:  Discharge Planning              Discharge Planning: Inpatient Rehabilitation    SW reviewed EMR and participated in MPJ huddle  SW advised pt is medically stable for dc  SW confirmed with [facility] that pt has been accepted and can come today  SW updated primary team, pt's daughter and bedside nursing  Pharmacy is in house  Number for nursing report: [phone]  SW tasked CMA to deliver transfer packet  SW faxed dc orders to [phone]- didn't go through  SW emailed securely to [email or web address]  No further dc planning needs at this time.       Transportation              Does the Patient Need Case Management to Arrange Discharge Transport? (ex: facility, ambulance, wheelchair/stretcher, Medicaid, cab, other): Yes  Type of Transport: Wheelchair fleeta  Will the Patient Use Family Transport?: No  Transportation Name, Phone and Availability #1: daughter [name]  Support              Support: Pt/Family Updates re:POC or DC Plan, Huddle/team update  Info or Referral              Information or Referral to [employer]: Substance Abuse Treatment Resources  Positive SDOH Domains and Potential Barriers         Medication Needs              Medication Needs: No Needs Identified        Financial              Financial: No Needs Identified  Legal              Legal: No Needs Identified  Other              Other/None: No needs identified  Discharge Disposition                         Selected Continued Care - Discharged on 07/01/2024 Admission date: 06/23/2024 - Discharge disposition: Skilled Nursing Facility       Destination Coordination complete.      Service Provider Services Address Phone Fax Patient Preferred    [facility]  [facility] [address], [place]  [place] [address] [phone] --                    Duwaine Perkins, LMSW  Available on Voalte  Work Cell: [phone]

## 2024-07-01 NOTE — Case Management (ED)
 Case Management Progress Note    NAME:Hailey Rodgers                          MRN: [id number]              DOB:1951/06/07          AGE: 73 y.o.  ADMISSION DATE: 06/23/2024             DAYS ADMITTED: LOS: 7 days      Today's Date: 07/01/2024    PLAN: DC to IPR today     Expected Discharge Date: 06/30/2024   Is Patient Medically Stable: Yes   Are there Barriers to Discharge? no    INTERVENTION/DISPOSITION:  Discharge Planning              Discharge Planning: Inpatient Rehabilitation   Reviewed EMR, attended huddle   NCM updated [facility] of pt discharge to IPR   Scale and BP cuff Rx sent to [facility] 7/8 so daughter can obtain supplies for when she gets our of rehab.  Discussed with daughter.   Transportation              Does the Patient Need Case Management to Arrange Discharge Transport? (ex: facility, ambulance, wheelchair/stretcher, Medicaid, cab, other): Yes  Type of Transport: Wheelchair fleeta  Will the Patient Use Family Transport?: No  Transportation Name, Phone and Availability #1: daughter [name]  Support              Support: Pt/Family Updates re:POC or DC Plan, Huddle/team update  Info or Referral                 Positive SDOH Domains and Potential Barriers        Medication Needs                              Financial                 Legal                 Other                 Discharge Disposition                                                                                         Selected Continued Care - Admitted Since 06/23/2024       [place] Destination Coordination complete.      Service Provider Services Address Phone Fax Patient Preferred    [facility] [address], [place]  [place] [address] [phone] --                    Randall Ness, BSN, RN, [facility]  Integrated Nurse Case Manager  Available on [place]  Phone [phone]

## 2024-07-01 NOTE — Case Management (ED)
 CMA Note:       Printed and placed transfer packet with pt's chart in room per request from Megan Leihy, [facility].    Tobi Fortes  Case Management Assistant  For additional assistance please contact SWCM  *

## 2024-07-01 NOTE — Progress Notes
 General Progress Note    Name: AHTZIRY SAATHOFF        MRN: [id number]          DOB: 1951/01/13            Age: 73 y.o.  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   female with rheumatoid arthritis on chronic pre,  OSA on BIPAP, chronic heart failure with preserved EF,  CAD , HTN, dyslipidemia, IDDM type 2, history of urinary retentions ,  history of b  T12-iliac decompression and fusion ~ 01/2024  presented to [facility] ED on 06/23/24 for fever along with  acute on chronic back pain , diarrhea, foul urine/incontinence    Fever secondary to UTI due to E coli - resolved  History of urinary retention s/p multiple foley in the past  - UA:p  positive nitrite, 3+ leukocytes, packed WBCs            - 2-3 days dysuria and fever PTA  Plan:  > Obtain bladder PRN now. Retention improved. Monitor.    > s/p ceftriaxone  ( 7/3 - 7/7) with resolution of urinary symptoms     Fluid and gas within T12 vertebral body, lucency and T11. Hx of T12 iliac decompression/fusion on 01/2024  Acute on chronic back pain   - CT ABD/PELV W CONTRAST7/02/2024  1.  Prior laminectomies and posterior instrumented fusion from T12 through the sacrum with superior endplate compression deformities of T12 and L2 with mild vertebral body height loss, previously described on outside imaging 05/12/2024 (this imaging is not available for review). 2.  Fluid and gas within the T12 vertebral body with sclerosis throughout the vertebral body and partially marginated scalloped lucency of the adjacent inferior endplate of T11. These findings may be entirely related to a combination of postsurgical changes, recent fracture, and periprosthetic loosening/mechanical factors. However, superimposed discitis/osteomyelitis is a diagnostic consideration. Dedicated MRI of the thoracic and lumbar spine with contrast is recommended for further evaluation in the absence of recent comparative films to determine stability. 3.  Circumferential thickening of the urinary bladder wall consistent with cystitis given urinalysis results. F  - OSH 05/12/24 MRI T spine: Numbering scheme is based on the lumbar spine with the   wedging deformity and signal abnormalities at T12 noted as discussed in  the CT and MR lumbar spine.  There is some mild to moderate diffuse thoracic spondylosis with no  additional compression deformity seen. Moderate posterior ridging and  spurring is noted with mild to moderate canal narrowing as discussed above. No intrinsic cord signal abnormalities or additional  enhancing abnormalities are noted.   - osh 05/12/24 MRI L spine: moderate periimplant lucency surrounding the bilateral T12 pedicle   screws demonstrated on recent CT is associated with diffuse marrow edema throughout the entirety of the vertebral body, anterior superior  endplate fracture, and fluid-filled fracture cleft. The marrow signal  abnormality could be secondary to mechanical loosening and fracture.   - MRI spine : 1.  Long segment spinal posterior instrumentation spanning T12 through the   sacroiliac joints. Periprosthetic fracture of T12 with pedicle screw   loosening more completely assessed on same-day CT abdomen and pelvis.   2.  Scalloped inferior endplate height loss of T11 with mild enhancing   marrow edema of the T11 vertebral body and fluid within the T12 fracture   plane likely represents benign edema secondary to compression injuries,   junctional mechanical factors, and reactive osteitis. Superinfection is   less likely, particularly given absent secondary paraspinous or epidural   inflammatory findings.   Plan  > Continue PTA oxycodone  PRN , pregabalin  25 mg TID, robaxin  500 TID PRN , tylenol  PRN, lidocaine  patch   - Patient previously had discontinued pregabalin  but found it helpful for her chronic back pain, had reduced dose to 25 mg TID initially, tolerated. Increased to 50 mg TID 7/7  - PT / OT consulted, recommending inpt. Rehab medicine consult   - placement pendig         Diarrhea(non bloody)- resolved  - obtain C-diff PCR, stool culture, giardia , crypto - negative. Brief diarrhea for 2 days on the day prior and of admission, since resolved   - Diarrhea improving 7/4, then constiapted. Can use PEG BID PRN, senna PRN      HLD  Chronic heart failure with preserved EF  OSA on BIPAP  - No oxygen at baseline                > Continue PTA aspirin                 > Continue PTA Crestor                 > Continue to hold bumex  and spirolactone for now. Daughter reports patient's bumex  and spirolactone has been HOLD since 02/2024 hospitalization. 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 This is restarted 3 weeks PTA with last dose on Sunday  06/20/24, likely can skip 1 week due to infection then resume      Hypothyroidism -continue PTA synthroid   GERD - continue PTA PPI    Suicidal ideation  MDD  - reports chronic SI, significant past trauma, chronic pain. Adamant that she isn't actually going to do it and says things like that sometimes. Reportedly had a near attempt ~ 6 months prior, was going to take pills, but when her daughter said she was going to report her SI, she threw the pills at daughter. [place] access [place] medications and means [place] and says the she could have if she really wanted to but isnt going to  , psych consulted    - mirtazapine  15 qhs    - Continue venlafaxine  225   - DC CO   - outpatient follow up     Hx Iron  Def  - Taking BID PO iron  but sometimes constipated. Changed to 3xW    Hx PNES  - no signs of seizures on prior workup. CTM         FEN: no IVF, electrolytes reviewed, No diet orders on file   VTE Prophalyxis: lovenox   Disposition: continued admisison   Code status: Full Code  (Discussed on 06/24/2024)      Total Time Today was >35 minutes in the following activities: Preparing to see the patient, Obtaining and/or reviewing separately obtained history, Performing a medically appropriate examination and/or evaluation, Counseling and educating the patient/family/caregiver, Ordering medications, tests, or procedures, Referring and communication with other health care professionals (when not separately reported), Documenting clinical information in the electronic or other health record, and Independently interpreting results (not separately reported) and communicating results to the patient/family/caregiver, discharge planning                  Wound:      Wounds Skin tear Anterior;Left Knee (Active)   06/24/24 0620   Wound Type: Skin tear   Orientation: Anterior;Left   Location: Knee   Wound Location Comments: scab   Initial Wound Site Closure:    Initial Dressing Placed:    Initial Cycle:    Initial Suction Setting (mmHg):    Pressure Injury Stages:    Pressure Injury Present Within 24 Hours of Hospital Admission:    If This Pressure Injury Is Suspected to Be Device Related, Please Select the Device::    Is the Wound Open or Closed:    Wound Assessment Pink 06/30/24 2000   Peri-wound Assessment Dry 06/30/24 2000   Wound Drainage Amount None 06/30/24 2000   Wound Dressing Status None/open to air 06/30/24 2000   Wound Care Dressing changed or new application 06/26/24 2100   Wound Dressing and/or Treatment A & D ointment;Primapore 06/29/24 2043   Number of days: 7                    _______________________________________________________________________    Subjective   RAELYNNE LUDWICK is a 73 y.o. female.  Pain still bothersome in back. No major changes. Denies other new issues. Is looking forward to getting to rehab today  Medications  Scheduled Meds:aspirin  chewable tablet 81 mg, 81 mg, Oral, QHS  calcium  carbonate (OS-CAL) tablet 1,250 mg, 1,250 mg, Oral, BID  celecoxib  (CeleBREX ) capsule 200 mg, 200 mg, Oral, QDAY  CHOLEcalciferoL  (vitamin D3) tablet 5,000 Units, 5,000 Units, Oral, Sundays  enoxaparin  (LOVENOX ) syringe 40 mg, 40 mg, Subcutaneous, QDAY(21)  famotidine  (PEPCID ) tablet 40 mg, 40 mg, Oral, QHS  ferrous sulfate  (FEOSOL) tablet 325 mg, 325 mg, Oral, Once per day on Monday Wednesday Friday  folic acid  (FOLVITE ) tablet 4 mg, 4 mg, Oral, QDAY  insulin  aspart (U-100) (NOVOLOG  FLEXPEN U-100 INSULIN ) injection PEN 0-12 Units, 0-12 Units, Subcutaneous, ACHS (22)  insulin  aspart (U-100) (NOVOLOG  FLEXPEN U-100 INSULIN ) injection PEN 15 Units, 15 Units, Subcutaneous, TID before meals  insulin  glargine (LANTUS  SOLOSTAR U-100 INSULIN ) injection PEN 15 Units, 15 Units, Subcutaneous, BID  levothyroxine  (SYNTHROID ) tablet 88 mcg, 88 mcg, Oral, QDAY  lidocaine  (LIDODERM ) 5 % topical patch 1 patch, 1 patch, Topical, QDAY  loratadine  (CLARITIN ) tablet 10 mg, 10 mg, Oral, QDAY  mirtazapine  (REMERON ) tablet 15 mg, 15 mg, Oral, QHS  multivitamin (ONE-A-DAY) tablet 1 tablet, 1 tablet, Oral, QDAY(08)  pantoprazole  DR (PROTONIX ) tablet 40 mg, 40 mg, Oral, QDAY  polyethylene glycol 3350  (MIRALAX ) packet 17 g, 17 g, Oral, QDAY  pramipexole  (MIRAPEX ) tablet 0.75 mg, 0.75 mg, Oral, QHS  predniSONE  (DELTASONE ) tablet 5 mg, 5 mg, Oral, QDAY  pregabalin  (LYRICA ) capsule 25 mg, 25 mg, Oral, Q8H  rosuvastatin  (CRESTOR ) tablet 20 mg, 20 mg, Oral, QDAY  venlafaxine  XR (EFFEXOR  XR) capsule 225 mg, 225 mg, Oral, QDAY    Continuous Infusions:  PRN and Respiratory Meds:acetaminophen  Q4H PRN, albuterol  sulfate Q4H PRN, artificial tears (PF) single dose PRN, bisacodyL  QDAY PRN, dextrose  50% (D50) IV PRN, diclofenac  sodium QID PRN, fluticasone  propionate BID PRN, hydrocortisone  acetate BID PRN, loperamide  PRN, melatonin QHS PRN, methocarbamoL  TID PRN, nifedipine  0.3%/lidocaine  1.5%/white petrolatum (#) QID PRN, nitroglycerin  Q5 MIN PRN, oxyCODONE  Q6H PRN, sodium chloride  PRN        Objective                        Vital Signs: Last Filed                 Vital Signs: 24 Hour Range   BP: 136/80 (07/10 0849)  Temp: 36.6 ?C (97.9 ?F) (07/10 9150)  Pulse: 60 (07/10 0849)  Respirations: 18 PER MINUTE (07/10 0849)  SpO2: 96 % (07/10 0849)  O2 Device: None (Room air) (07/10 0849) BP: (109-154)/(51-80)   Temp:  [36.3 ?C (97.4 ?F)-36.8 ?C (98.3 ?F)]   Pulse:  [60-81]   Respirations:  [16 PER MINUTE-20 PER MINUTE]   SpO2:  [96 %-98 %]   O2 Device: None (Room air)   Intensity Pain Scale (Self Report): 4 (06/30/24 2026) Vitals:    06/27/24 0354 06/28/24 0137 07/01/24 9361   Weight: 83.1 kg (183 lb 3.2 oz) 78.5 kg (173 lb) 86.5 kg (190 lb 11.2 oz)         Intake/Output Summary:  (Last 24 hours)    Intake/Output Summary (Last 24 hours) at 07/01/2024 1047  Last data filed at 07/01/2024 0849  Gross per 24 hour   Intake 2007 ml   Output 2850 ml   Net -843 ml     Stool Occurrence: 0
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
## 2024-07-02 NOTE — Discharge Instructions - Pharmacy
 Discharge Summary      Name: Hailey Rodgers  Medical Record Number: [id number]        Account Number:  [id number]  Date Of Birth:  August 15, 1951                         Age:  73 y.o.  Admit date:  06/23/2024                     Discharge date: 07/01/2024      Discharge Attending:  Norman CHRISTELLA Louder, MD    Discharge Summary Completed By: Norman CHRISTELLA Louder, MD    Service: Med Private [phone]    Reason for hospitalization:  Back pain with sciatica [M54.9, M54.30]  Dysuria  Fever    Primary Discharge Diagnosis:   Fever secondary to E coli UTI    Hospital Diagnoses:  Hospital Problems        Active Problems    * (Principal) Acute cystitis without hematuria    Psychogenic nonepileptic seizure    Obstructive sleep apnea    Spinal stenosis of lumbar region with neurogenic claudication    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)    Rheumatoid arthritis (CMS-HCC)    Hypothyroidism due to acquired atrophy of thyroid    Mixed hyperlipidemia    Back pain with sciatica     Present on Admission:   Back pain with sciatica   Psychogenic nonepileptic seizure   Obstructive sleep apnea   Spinal stenosis of lumbar region with neurogenic claudication   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)   Rheumatoid arthritis (CMS-HCC)   Hypothyroidism due to acquired atrophy of thyroid   Mixed hyperlipidemia   Acute cystitis without hematuria        Significant Past Medical History        Age-related physical debility  Allergic rhinitis  Arthritis  Asthma  Bronchitis  Cancer (CMS-HCC)  Cervical stenosis of spinal canal  Convulsion (CMS-HCC)  Coronary artery disease  COVID-19 virus infection      Comment:  06/2020  Disorganized thinking  Diverticulosis  DM (diabetes mellitus) (CMS-HCC)  Dyslipidemia  Dyspnea  Fatty liver disease, nonalcoholic  Gastroesophageal reflux disease without esophagitis  GERD (gastroesophageal reflux disease)  Hypertriglyceridemia  Hypothyroidism  Hypothyroidism due to acquired atrophy of thyroid  Infection Comment:  Colitis-hospitalized, Infection in my Nostrils  Leg weakness  Lumbar stenosis  Lung disease      Comment:  Bronchitis  Lung nodule      Comment:  10/06/2020 5.  Right upper lobe groundglass nodule                measuring 5 mm which may be  infectious or inflammatory.                Multiple additional sub-5 mm nodules  bilaterally, most                lymph nodes or granulomas. Follow-up CT in 12 months                 recommended for further evaluation if clinically                indicated.   Memory loss  Movement disorder  Narcolepsy  Obesity  On supplemental oxygen therapy      Comment:  Only at night - No longer use on Bi-Pap now  OSA (obstructive sleep apnea)  Other dysphagia  Other hyperlipidemia  Peripheral neuropathy  Polypharmacy  Postmenopausal  Pseudoseizures  Recurrent major depressive disorder, in partial remission  Rhabdomyolysis      Comment:  2021?  Rheumatoid arthritis (CMS-HCC)  Seizures (CMS-HCC)  Sleep disorder      Comment:  Sleep Apnea & Narcolepsy  Syncope and collapse      Comment:  Age 48 years old  Type 2 diabetes mellitus with hyperglycemia, with long-term current   use of insulin  (CMS-HCC)  Unspecified deficiency anemia  Urinary incontinence  Urinary problem      Comment:  urinary frequency  Vision decreased      Comment:  diplopia    Allergies   Amoxicillin-pot clavulanate, Gemfibrozil, Morphine , Trazodone, Adhesive tape (rosins), Atorvastatin, Latex, Bupropion, Cymbalta [duloxetine], and Levaquin [levofloxacin]    Brief Hospital Course   The patient was admitted and the following issues were addressed during this hospitalization: (with pertinent details including admission exam/imaging/labs).      Hailey Rodgers is a 73 y/o F with a Pmhx OSA, HFpEF, HTN, HLD, DMII, chronic back pain with decompression/fusion 01/2024 who presented with low grade fevers, dysuria, and worsening chronic back pain.    Due to fevers and pain, additional imaging obtained:    - CT ABD/PELV W CONTRAST7/02/2024  1.  Prior laminectomies and posterior instrumented fusion from T12 through the sacrum with superior endplate compression deformities of T12 and L2 with mild vertebral body height loss, previously described on outside imaging 05/12/2024 (this imaging is not available for review). 2.  Fluid and gas within the T12 vertebral body with sclerosis throughout the vertebral body and partially marginated scalloped lucency of the adjacent inferior endplate of T11. These findings may be entirely related to a combination of postsurgical changes, recent fracture, and periprosthetic loosening/mechanical factors. However, superimposed discitis/osteomyelitis is a diagnostic consideration. Dedicated MRI of the thoracic and lumbar spine with contrast is recommended for further evaluation in the absence of recent comparative films to determine stability. 3.  Circumferential thickening of the urinary bladder wall consistent with cystitis given urinalysis results    Given back surgery and above CT findings on her spine, MRI Spine obtained:    - MRI spine : 1.  Long segment spinal posterior instrumentation spanning T12 through the   sacroiliac joints. Periprosthetic fracture of T12 with pedicle screw   loosening more completely assessed on same-day CT abdomen and pelvis.   2.  Scalloped inferior endplate height loss of T11 with mild enhancing   marrow edema of the T11 vertebral body and fluid within the T12 fracture   plane likely represents benign edema secondary to compression injuries,   junctional mechanical factors, and reactive osteitis. Superinfection is   less likely, particularly given absent secondary paraspinous or epidural   inflammatory findings.     Orthopedics was consulted. With MRI findings, other cause for fever (below), it was not felt this represented spinal infection and she was managed with pain medication adjustments, PT, OT, and discharged to SNF.    Urine cultures grew E coli. Blood cultures remained negative. Completed 5 days ceftriaxone  with complete resolution of symptoms. Early during hospital stay, had mild urinary retention but this resolved.     Patient made some suicidal statements without plan or intent, with many stressors at home with her pain and health issues and her husband having worsening cognitive function while their daughter tries to help them both. She also had much prior trauma and chronic depression. Mirtazapine  was increased and she is being reconnected with  outpatient psychiatry post-rehab.     Patient's home insulin  regimen was 40 units BID glargine and 30 units TIDAC aspart. Even with dose reduction in the hospital, patient had significant hypoglycemia. Her blood sugar in the hospital was well controlled on glargine 15 BID and aspart 15 TID AC.           Items Needing Follow Up   Pending items or areas that need to be addressed at follow up:   - No specific new follow up items related to hospital stay beyond on-going management of chronic conditions.     Pending Labs and Follow Up Radiology    Pending labs and/or radiology review at this time of discharge are listed below: Please note- any labs with collected status will not have a result; if this area is blank, there are no items for review.         Medications      Medication List      PAUSE taking these medications     GEMTESA  75 mg tablet; Wait to take this until your doctor or other care   provider tells you to start again.; Generic drug: vibegron ; Dose: 75 mg;   Take one tablet by mouth at bedtime daily.; Quantity: 30 tablet; Refills:   3     START taking these medications     blood pressure test kit-Large kit with cuff; Doctor's comments: Utilize   SW voucher for BP cuff; Take blood pressure as directed by physician.    Indications: CHF; For: CHF; Quantity: 1 kit; Refills: 0   digital body weight scale; Doctor's comments: Utilize SW voucher for   scale.; Weigh daily in the morning.  Indications: CHF; For: CHF; Quantity: 1 each; Refills: 0   ferrous sulfate  325 mg (65 mg iron ) tablet; Commonly known as: Iron ;   Dose: 325 mg; Take one tablet by mouth three times weekly. Take on an   empty stomach at least 1 hour before or 2 hours after food. Take on   Monday, Wednesday and Fridays  Indications: anemia from inadequate iron ;   For: anemia from inadequate iron ; Refills: 0   nifedipine  0.3%/lidocaine  1.5%/white petrolatum (#); Apply to hemorrhoids   QID PRN  Indications: hemorrhoid irriation; For: hemorrhoid irriation;   Refills: 0   sodium chloride  0.65 % nasal spray; Commonly known as: SEA MIST; Dose: 2   spray; Apply two sprays to each nostril as directed as Needed.   Indications: dryness of the nose; For: dryness of the nose; Refills: 0     CHANGE how you take these medications     acetaminophen  500 mg tablet; Commonly known as: TYLENOL  EXTRA STRENGTH;   Dose: 1,000 mg; Take two tablets by mouth every 6 hours as needed. Max of   4,000 mg of acetaminophen  in 24 hours.  Indications: pain; For: pain;   Quantity: 90 tablet; Refills: 0; What changed: Another medication with the   same name was removed. Continue taking this medication, and follow the   directions you see here.   celecoxib  200 mg capsule; Commonly known as: CeleBREX ; Dose: 200 mg;   Take one capsule by mouth daily. Indications: rheumatoid arthritis; For:   rheumatoid arthritis; Refills: 0; What changed: when to take this   CHOLEcalciferoL  (vitamin D3) 125 mcg (5,000 unit) capsule; Commonly   known as: DIALYVITE VITAMIN D ; Dose: 5,000 Units; Take one capsule by   mouth every Sunday.; Quantity: 1 capsule; Refills: 0; What changed:   Another medication with the same name  was removed. Continue taking this   medication, and follow the directions you see here.   insulin  aspart (U-100) 100 unit/mL (3 mL) PEN; Commonly known as:   NovoLOG  Flexpen U-100 Insulin ; Dose: 15 Units; Inject fifteen Units under   the skin three times daily before meals. Indications: type 2 diabetes mellitus; For: type 2 diabetes mellitus; Refills: 0; What changed: how   much to take   LANTUS  SOLOSTAR U-100 INSULIN  100 unit/mL (3 mL) subcutaneous PEN;   Generic drug: insulin  glargine; Doctor's comments: The pharmacist may   select Lantus  Solorstar or Basaglar  Kary based on what is cheaper for   the patient.; Inject fifteen Units under the skin every morning AND   fifteen Units at bedtime daily. Indications: type 2 diabetes mellitus;   For: type 2 diabetes mellitus; Refills: 0; What changed: See the new   instructions., Another medication with the same name was removed. Continue   taking this medication, and follow the directions you see here.   lidocaine  5 % topical patch; Commonly known as: LIDODERM ; Place 1 patch   on the skin daily. Remove & Discard patch within 12 hours or as directed   by MD; Quantity: 30 patch; Refills: 0; What changed: Another medication   with the same name was removed. Continue taking this medication, and   follow the directions you see here.   mirtazapine  7.5 mg tablet; Dose: 15 mg; Take two tablets by mouth at   bedtime daily. Indications: major depressive disorder; For: major   depressive disorder; Refills: 0; What changed: how much to take, when to   take this   oxyCODONE  5 mg tablet; Commonly known as: ROXICODONE ; Dose: 5 mg; Take   one tablet by mouth every 6 hours as needed for Pain. Indications: pain;   For: pain; Refills: 0; What changed: Another medication with the same name   was removed. Continue taking this medication, and follow the directions   you see here.   predniSONE  5 mg tablet; Commonly known as: DELTASONE ; Dose: 5 mg; Take   one tablet by mouth daily. Indications: ra; For: ra; Refills: 0; What   changed: medication strength, how much to take     CONTINUE taking these medications     albuterol  sulfate 90 mcg/actuation HFA aerosol inhaler; Commonly known   as: PROAIR  HFA; Dose: 2 puff; Inhale two puffs by mouth into the lungs   every 6 hours as needed for Wheezing for 5 days.; Quantity: 6.7 g;   Refills: 0   aspirin  81 mg chewable tablet; Dose: 81 mg; Chew one tablet by mouth   daily.; Quantity: 5 tablet; Refills: 0   baclofen  10 mg tablet; Commonly known as: LIORESAL ; Dose: 10 mg; Take   one tablet by mouth three times daily as needed for Muscle Cramps or Pain.   Indications: muscle spasms caused by a spinal disease; For: muscle spasms   caused by a spinal disease; Quantity: 90 tablet; Refills: 1   bisacodyL  10 mg rectal suppository; Commonly known as: DULCOLAX   (BISACODYL ); Dose: 10 mg; Insert or Apply one suppository to rectal area   as directed daily as needed.; Quantity: 5 suppository; Refills: 0   calcium  carbonate 600 mg calcium  (1,500 mg) tablet; Commonly known as:   CALTRATE 600; Dose: 2 tablet; Take two tablets by mouth twice daily.;   Quantity: 60 tablet; Refills: 0   diclofenac  sodium 1 % topical gel; Commonly known as: VOLTAREN ; Apply 2   g topically 4 (four) times  a day as needed.; Quantity: 50 g; Refills: 0   dicyclomine  20 mg tablet; Commonly known as: BENTYL ; Dose: 20 mg; Take   one tablet by mouth every 6-8 hours as needed for pain.; Quantity: 20   tablet; Refills: 0   docusate 100 mg capsule; Commonly known as: COLACE; Dose: 200 mg;   Refills: 0   famotidine  40 mg tablet; Commonly known as: PEPCID ; Dose: 40 mg; Take   one tablet by mouth daily.; Quantity: 30 tablet; Refills: 0   fexofenadine 180 mg tablet; Commonly known as: ALLEGRA; Dose: 180 mg;   Take one tablet by mouth daily.; Quantity: 5 tablet; Refills: 0   fluticasone  propionate 50 mcg/actuation nasal spray, suspension;   Commonly known as: FLONASE  ALLERGY RELIEF; Dose: 1 spray; Apply one spray   to each nostril as directed twice daily for 5 days.; Quantity: 16 g;   Refills: 0   folic acid  1 mg tablet; Commonly known as: FOLVITE ; Dose: 4 mg; Take   four tablets by mouth daily.; Quantity: 360 tablet; Refills: 3   hydrocortisone  acetate 25 mg rectal suppository; Commonly known as: ANUSOL -HC; Dose: 25 mg; Insert or Apply one suppository to rectal area as   directed twice daily.; Quantity: 10 suppository; Refills: 0   lactobacillus rhamnosus GG 15 billion cell capsule; Commonly known as:   CULTURELLE; Dose: 1 capsule; Refills: 0   levothyroxine  88 mcg tablet; Commonly known as: SYNTHROID ; Dose: 88 mcg;   Take one tablet by mouth daily.; Quantity: 5 tablet; Refills: 0   methotrexate  sodium 2.5 mg tablet; Dose: 25 mg; Take ten tablets by   mouth every Monday.; Quantity: 10 tablet; Refills: 0   milk of magnesium  400 mg/5 mL oral suspension; Commonly known as: MILK   OF MAGNESIA; Dose: 30 mL; Take 30 mL by mouth daily as needed for   constipation.; Quantity: 360 mL; Refills: 0   multivit-iron -FA-calcium -mins 9 mg iron -400 mcg tablet; Commonly known   as: THERA-M; Dose: 1 tablet; Refills: 0   multivitamin with folic acid  400 mcg Tab; Commonly known as: TAB-A-VITE;   Dose: 1 tablet; Take one tablet by mouth daily.; Quantity: 100 tablet;   Refills: 0   mupirocin  2 % topical ointment; Commonly known as: BACTROBAN ; Apply 1   application in the nostrils twice a day; Quantity: 30 g; Refills: 6   naloxone  4 mg/actuation nasal spray; Commonly known as: NARCAN ; Give 1   spray in one nostril if opioid overdose suspected. If no response in 3   minutes, repeat dose in other nostril with 2nd spray device.; Quantity: 2   each; Refills: 1   ondansetron  8 mg rapid dissolve tablet; Commonly known as: ZOFRAN  ODT;   Dose: 1 tablet; Refills: 0   polyethylene glycol 3350  17 g packet; Commonly known as: MIRALAX ; Dose:   17 g; Take one packet by mouth twice daily as needed.; Quantity: 12 each;   Refills: 0   pramipexole  0.75 mg tablet; Commonly known as: MIRAPEX ; Dose: 0.75 mg;   Take one tablet by mouth daily.; Quantity: 90 tablet; Refills: 3   pregabalin  25 mg capsule; Commonly known as: LYRICA ; Dose: 25 mg; Take   one capsule by mouth three times daily.; Quantity: 90 capsule; Refills: 5   rosuvastatin  20 mg tablet; Commonly known as: CRESTOR ; Dose: 20 mg; Take   one tablet by mouth daily.; Quantity: 90 tablet; Refills: 3   STIMULANT LAXATIVE PLUS 8.6-50 mg tablet; Generic drug:   sennosides-docusate sodium ; Dose: 1 tablet; Take one tablet  by mouth   daily.; Quantity: 30 tablet; Refills: 0   * TRULICITY  3 mg/0.5 mL injection pen; Generic drug: dulaglutide ;   Refills: 0   * TRULICITY  1.5 mg/0.5 mL injection pen; Generic drug: dulaglutide ;   Dose: 1.5 mg; Inject 1.5 mg under the skin every 7 (seven) days.;   Quantity: 2 mL; Refills: 0   * TRULICITY  1.5 mg/0.5 mL injection pen; Generic drug: dulaglutide ;   Dose: 1.5 mg; Doctor's comments: changed to 2mL; cannot break box -   06.09.25 0947 KeeverC; Inject 0.5 mL under the skin every 7 days.;   Quantity: 2 mL; Refills: 0   * venlafaxine  XR 150 mg capsule; Commonly known as: EFFEXOR  XR; Dose:   150 mg; Take one capsule by mouth daily.; Quantity: 90 capsule; Refills: 3   * venlafaxine  XR 75 mg capsule; Commonly known as: EFFEXOR  XR; Dose: 225   mg; Take three capsules by mouth daily.; Quantity: 15 capsule; Refills: 0  * This list has 5 medication(s) that are the same as other medications   prescribed for you. Read the directions carefully, and ask your doctor or   other care provider to review them with you.     STOP taking these medications     calcium  carbonate 500 mg (200 mg elemental calcium ) chewable tablet;   Commonly known as: TUMS   LINZESS 72 mcg capsule; Generic drug: linaCLOtide   methocarbamoL  500 mg tablet; Commonly known as: ROBAXIN    methocarbamoL  750 mg tablet; Commonly known as: ROBAXIN    nystatin  100,000 unit/g topical cream; Commonly known as: MYCOSTATIN    spironolactone  25 mg tablet; Commonly known as: ALDACTONE        Return Appointments and Scheduled Appointments     Scheduled appointments:      Jul 07, 2024 2:30 PM  Office visit with Ozell JONETTA Endow, DO  Rheumatology: [facility] (Internal Medicine) [address]  [address], [address]  [place] [address]  [phone]     Jul 15, 2024 2:00 PM  Office visit with Elspeth JONETTA Balloon, MD  Cardiovascular Medicine: [facility] (CVM Exam) [address]  [address], [address]  [place] [address]  [phone]     Aug 03, 2024 3:15 PM  Office visit with Garnette LITTIE Fallow, MD, SLA [facility] DEVICE  Eye Care: [address] (Ophthalmology) [address]  [place] [address]  [phone]     Sep 01, 2024 3:30 PM  Office visit with Lamarr JONETTA Pan, APRN-NP  Pulmonology: [facility] (Internal Medicine) [address] [place] [address]  [phone]     Sep 07, 2024 1:10 PM  Office visit with Sula JULIANNA Captain, MD  Neurology: [facility] (Neurology) [address]  [place] [address]  [phone]     Nov 11, 2024 12:30 PM  Office visit with Camellia SHAUNNA Lard, PhD  Neuropsychology: [place] [facility] (Neurology) [address] [place]  [address]  [place] [address]  [phone]          Things you need to do       Follow up with Landy Deal, MD    Phone: [phone]    Where: [address], [place] [address]          Contact information for after-discharge care                [facility] [place] [facility]  [place]  [place]    Phone: [phone]    Fax: [phone]    Where: [address] [place] [address]    Service: Inpatient Rehabilitation                  Consults, Procedures, Diagnostics, Micro, Pathology   Consults: Orthopedics and Psychiatry  Surgical Procedures & Dates: None  Significant Diagnostic Studies, Micro and Procedures: noted in brief hospital course  Significant Pathology: none                       Discharge Disposition, Condition   Patient Disposition: Skilled Nursing Facility [03]  Condition at Discharge: Stable    Code Status   Prior    Patient Instructions     Activity       Activity as Tolerated   As directed      It is important to keep increasing your activity level after you leave the hospital.  Moving around can help prevent blood clots, lung infection (pneumonia) and other problems.  Gradually increasing the number of times you are up moving around will help you return to your normal activity level more quickly.  Continue to increase the number of times you are up to the chair and walking daily to return to your normal activity level. Begin to work toward your normal activity level at discharge          Diet       Diabetic Diet   As directed      You should eat between 1600 and 2000 calories per day.  This is equal to 60g (grams) of carbohydrates per meal, and 30g of carbohydrates for a bedtime snack.    If you have questions about your diet after you go home, you can call a dietitian at [phone].             Discharge education provided to patient., Signs and Symptoms:   Report these signs and symptoms       Report These Signs and Symptoms   As directed      Please contact your doctor if you have any of the following symptoms: temperature higher than 100.4 degrees F, uncontrolled pain, persistent nausea and/or vomiting, difficulty breathing, chest pain, severe abdominal pain, headache, unable to urinate, unable to have bowel movement, or drainage with a foul odor        , Education: , and Others Instructions:   Other Orders       Additional Discharge Instructions   Complete by: As directed      Order comments: Patient was taking glargine 40 units twice daily and aspart 30 units TID with meals PTA. However, she had profound hypoglycemia on a reduced regimen. Gluocse has been at goal on 15 glargine BID + 15 aspart TID AC    Complete if patient is going to a Skilled Nursing Facility   Complete by: As directed      I certify that the patient requires skilled care: Yes    The patient's stay is expected to be less than 30 days: Yes    I will be in charge of patient in nursing home: No    Questions About Your Stay   Complete by: As directed      Discharging attending physician: VICCI NORMAN HERO    Order comments: For questions or concerns regarding your hospital stay, call [phone].  Additional Orders: Case Management, Supplies, Home Health     Home Health/DME       None              Signed:  Norman CHRISTELLA Louder, MD  07/02/2024      cc:  Primary Care Physician:  Landy Deal   Verified    Referring physicians:  No ref. provider found   Additional provider(s):        Did we miss something? If additional records are needed, please fax a request on office letterhead to [phone]. Please include the patient's name, date of birth, fax number and type of information needed. Additional request can be made by email at [email or web address]. For general questions of information about electronic records sharing, call [phone].

## 2024-07-14 ENCOUNTER — Encounter: Admit: 2024-07-14 | Discharge: 2024-07-14 | Payer: MEDICARE

## 2024-07-15 ENCOUNTER — Encounter: Admit: 2024-07-15 | Discharge: 2024-07-15 | Payer: MEDICARE

## 2024-07-15 NOTE — Telephone Encounter
 Pharmacist LVM - asking about refill fo novolog  insulin . Contacted Dr. Vicci, per Dr. Vicci, pt was discharged to rehab, needs to get refill from PCP. Called pharmacy back, Pharmacist contacted Dr. Vicci by other means, was aware PCP needs to fill insulin .

## 2024-07-16 ENCOUNTER — Encounter: Admit: 2024-07-16 | Discharge: 2024-07-16 | Payer: MEDICARE

## 2024-07-16 ENCOUNTER — Emergency Department: Admit: 2024-07-16 | Discharge: 2024-07-16 | Payer: MEDICARE

## 2024-07-16 DIAGNOSIS — M549 Dorsalgia, unspecified: Secondary | ICD-10-CM

## 2024-07-16 DIAGNOSIS — L600 Ingrowing nail: Secondary | ICD-10-CM

## 2024-07-16 DIAGNOSIS — E1142 Type 2 diabetes mellitus with diabetic polyneuropathy: Secondary | ICD-10-CM

## 2024-07-16 DIAGNOSIS — M79604 Pain in right leg: Secondary | ICD-10-CM

## 2024-07-16 DIAGNOSIS — M4324 Fusion of spine, thoracic region: Secondary | ICD-10-CM

## 2024-07-16 LAB — COMPREHENSIVE METABOLIC PANEL
~~LOC~~ BKR ALBUMIN: 4.2 g/dL (ref 3.5–5.0)
~~LOC~~ BKR ALT: 76 U/L — ABNORMAL HIGH (ref 7–56)
~~LOC~~ BKR AST: 65 U/L — ABNORMAL HIGH (ref 7–40)
~~LOC~~ BKR CHLORIDE: 97 mmol/L — ABNORMAL LOW (ref 98–110)
~~LOC~~ BKR CO2: 29 mmol/L (ref 21–30)
~~LOC~~ BKR POTASSIUM: 4.7 mmol/L (ref 3.5–5.1)
~~LOC~~ BKR TOTAL BILIRUBIN: 0.5 mg/dL (ref 0.2–1.3)

## 2024-07-16 LAB — CBC AND DIFF
~~LOC~~ BKR ABSOLUTE BASO COUNT: 0.1 10*3/uL (ref 0.00–0.20)
~~LOC~~ BKR ABSOLUTE EOS COUNT: 0.4 10*3/uL (ref 0.00–0.45)
~~LOC~~ BKR ABSOLUTE LYMPH COUNT: 1.9 10*3/uL (ref 1.00–4.80)
~~LOC~~ BKR ABSOLUTE MONO COUNT: 0.6 10*3/uL (ref 0.00–0.80)
~~LOC~~ BKR ABSOLUTE NEUTROPHIL: 4.2 10*3/uL (ref 1.80–7.00)
~~LOC~~ BKR LYMPHOCYTES %: 26 % — ABNORMAL HIGH (ref 24.0–44.0)
~~LOC~~ BKR MCH: 27 pg (ref 26.0–34.0)
~~LOC~~ BKR MCHC: 33 g/dL (ref 32.0–36.0)
~~LOC~~ BKR MCV: 81 fL — ABNORMAL HIGH (ref 80.0–100.0)
~~LOC~~ BKR MDW (MONOCYTE DISTRIBUTION WIDTH): 17 (ref <=20.6)
~~LOC~~ BKR PLATELET COUNT: 248 10*3/uL (ref 150–400)
~~LOC~~ BKR RBC COUNT: 4.6 10*6/uL — ABNORMAL LOW (ref 4.00–5.00)
~~LOC~~ BKR RDW: 18 % — ABNORMAL HIGH (ref 11.0–15.0)
~~LOC~~ BKR WBC COUNT: 7 10*3/uL (ref 4.50–11.00)

## 2024-07-16 MED ORDER — METHOCARBAMOL 750 MG PO TAB
750 mg | Freq: Once | ORAL | 0 refills | Status: CP
Start: 2024-07-16 — End: ?
  Administered 2024-07-17: 750 mg via ORAL

## 2024-07-16 MED ORDER — KETOROLAC 15 MG/ML IJ SOLN
15 mg | Freq: Once | INTRAVENOUS | 0 refills | Status: CP
Start: 2024-07-16 — End: ?
  Administered 2024-07-17: 04:00:00 15 mg via INTRAVENOUS

## 2024-07-16 MED ORDER — FENTANYL CITRATE (PF) 50 MCG/ML IJ SOLN
50 ug | Freq: Once | INTRAVENOUS | 0 refills | Status: CP
Start: 2024-07-16 — End: ?
  Administered 2024-07-17: 50 ug via INTRAVENOUS

## 2024-07-16 MED ORDER — DIAZEPAM 5 MG/ML IJ SYRG
2.5 mg | Freq: Once | INTRAVENOUS | 0 refills | Status: CP | PRN
Start: 2024-07-16 — End: ?
  Administered 2024-07-17: 04:00:00 2.5 mg via INTRAVENOUS

## 2024-07-16 MED ORDER — DICLOFENAC SODIUM 1 % TP GEL
2 g | Freq: Once | TOPICAL | 0 refills | Status: CP
Start: 2024-07-16 — End: ?
  Administered 2024-07-17: 2 g via TOPICAL

## 2024-07-16 MED ORDER — ACETAMINOPHEN 325 MG PO TAB
650 mg | Freq: Once | ORAL | 0 refills | Status: CP
Start: 2024-07-16 — End: ?
  Administered 2024-07-17: 650 mg via ORAL

## 2024-07-16 MED ORDER — FENTANYL CITRATE (PF) 50 MCG/ML IJ SOLN
50 ug | Freq: Once | INTRAVENOUS | 0 refills | Status: CP
Start: 2024-07-16 — End: ?
  Administered 2024-07-17: 02:00:00 50 ug via INTRAVENOUS

## 2024-07-16 MED FILL — PREGABALIN 50 MG PO CAP: 50 mg | ORAL | 30 days supply | Qty: 90 | Fill #0 | Status: CP

## 2024-07-16 MED FILL — INSULIN LISPRO 100 UNIT/ML SC INPN: 100 [IU]/mL | SUBCUTANEOUS | 17 days supply | Qty: 15 | Fill #0 | Status: CP

## 2024-07-16 MED FILL — FOLIC ACID 1 MG PO TAB: 1 mg | ORAL | 30 days supply | Qty: 120 | Fill #1 | Status: CP

## 2024-07-16 MED FILL — VENLAFAXINE 75 MG PO CP24: 75 mg | ORAL | 30 days supply | Qty: 90 | Fill #0 | Status: CP

## 2024-07-16 MED FILL — FLUCONAZOLE 100 MG PO TAB: 100 mg | ORAL | 3 days supply | Qty: 3 | Fill #0 | Status: CP

## 2024-07-16 NOTE — ED Notes
 ED Initial Provider Note:    This patient was seen in the ED triage area to initiate and expedite the patients ED care when possible.    ED Chief Complaint:   Chief Complaint   Patient presents with    Back Pain     Severe pain, unable to walk, numbness/tingling mainly down left leg and a cross hips, knees buckle when trying to stand, fell - Entered by patient       S: Hailey Rodgers is a 73 y.o. female who presents to the Emergency Department for severe back pain, difficulty ambulating, recent back surgery in March for back fracture, states she feels she was discharged from rehab too soon, she has difficulty ambulating, severe pain, fell twice today has new paresthesias to her left leg since the fall, denies striking her head.    PMHx:  Past Medical History:    Age-related physical debility    Allergic rhinitis    Arthritis    Asthma    Bronchitis    Cancer (CMS-HCC)    Cervical stenosis of spinal canal    Convulsion (CMS-HCC)    Coronary artery disease    COVID-19 virus infection    Disorganized thinking    Diverticulosis    DM (diabetes mellitus) (CMS-HCC)    Dyslipidemia    Dyspnea    Fatty liver disease, nonalcoholic    Gastroesophageal reflux disease without esophagitis    GERD (gastroesophageal reflux disease)    Hypertriglyceridemia    Hypothyroidism    Hypothyroidism due to acquired atrophy of thyroid    Infection    Leg weakness    Lumbar stenosis    Lung disease    Lung nodule    Memory loss    Movement disorder    Narcolepsy    Obesity    On supplemental oxygen therapy    OSA (obstructive sleep apnea)    Other dysphagia    Other hyperlipidemia    Peripheral neuropathy    Polypharmacy    Postmenopausal    Pseudoseizures    Recurrent major depressive disorder, in partial remission    Rhabdomyolysis    Rheumatoid arthritis (CMS-HCC)    Seizures (CMS-HCC)    Sleep disorder    Syncope and collapse    Type 2 diabetes mellitus with hyperglycemia, with long-term current use of insulin  (CMS-HCC) Unspecified deficiency anemia    Urinary incontinence    Urinary problem    Vision decreased       LMP  (LMP Unknown)    O: Brief Physical: Patient is alert and oriented  Respirations regular nonlabored.  Indicates generalized back pain and tenderness.    A/P: The patient was seen by me as an initial provider in triage. A brief history and physical was obtained. My exam is intended to be an initial medial screening exam. Initial orders have been placed by me. My working diagnosis is muscle strain, fracture.    The patient is deemed appropriate for the main ED. The patient's care will be resumed by the ED provider care team once the patient is roomed in the ED. A more detailed / complete H&P will be documented by those providers.

## 2024-07-16 NOTE — ED Notes
 73 y/o female presents to ED w/cc of back pain. Pt states that she is prone to falls and fell twice yesterday due to pain in her back. Back pain is now significantly worse and states she is unable to push herself up in a chair without having pain in her legs. Pt has back pain that is in her lower, mid, and upper back regions. Pt denies hitting her head. Pt has decreased strength in both legs and intermittent numbness.     Pt AOX4, with respirations even and unlabored during initial contact.

## 2024-07-17 ENCOUNTER — Encounter: Admit: 2024-07-17 | Discharge: 2024-07-17 | Payer: MEDICARE

## 2024-07-17 LAB — URINALYSIS MICROSCOPIC REFLEX TO CULTURE
~~LOC~~ BKR RBC, UA: 2 /HPF — AB (ref 5.0–8.0)
~~LOC~~ BKR SQUAMOUS EPI CELLS: 0 /HPF
~~LOC~~ BKR WBC, UA: 2 /HPF — AB (ref 1.005–1.030)

## 2024-07-17 LAB — COMPREHENSIVE METABOLIC PANEL
~~LOC~~ BKR ALT: 62 U/L — ABNORMAL HIGH (ref 7–56)
~~LOC~~ BKR ANION GAP: 7 10*3/uL (ref 3–12)
~~LOC~~ BKR AST: 46 U/L — ABNORMAL HIGH (ref 7–40)
~~LOC~~ BKR BLD UREA NITROGEN: 26 mg/dL — ABNORMAL HIGH (ref 7–25)
~~LOC~~ BKR CREATININE: 0.7 mg/dL (ref 0.40–1.00)
~~LOC~~ BKR POTASSIUM: 4.5 mmol/L (ref 3.5–5.1)

## 2024-07-17 LAB — CBC AND DIFF
~~LOC~~ BKR ABSOLUTE BASO COUNT: 0 10*3/uL (ref 0.00–0.20)
~~LOC~~ BKR ABSOLUTE EOS COUNT: 0.3 10*3/uL (ref 0.00–0.45)
~~LOC~~ BKR ABSOLUTE LYMPH COUNT: 1 10*3/uL (ref 1.00–4.80)
~~LOC~~ BKR ABSOLUTE MONO COUNT: 0.5 10*3/uL (ref 0.00–0.80)
~~LOC~~ BKR BASOPHILS %: 0.7 % (ref 0.0–2.0)
~~LOC~~ BKR HEMATOCRIT: 36 % (ref 36.0–45.0)
~~LOC~~ BKR LYMPHOCYTES %: 15 % — ABNORMAL LOW (ref 24.0–44.0)
~~LOC~~ BKR MCV: 82 fL — ABNORMAL HIGH (ref 80.0–100.0)
~~LOC~~ BKR MPV: 8.1 fL (ref 7.0–11.0)
~~LOC~~ BKR NEUTROPHILS %: 72 % (ref 41.0–77.0)
~~LOC~~ BKR PLATELET COUNT: 221 10*3/uL (ref 150–400)
~~LOC~~ BKR RBC COUNT: 4.3 10*6/uL — ABNORMAL LOW (ref 4.00–5.00)
~~LOC~~ BKR RDW: 18 % — ABNORMAL HIGH (ref 11.0–15.0)
~~LOC~~ BKR WBC COUNT: 6.7 10*3/uL (ref 4.50–11.00)

## 2024-07-17 LAB — POC GLUCOSE
~~LOC~~ BKR POC GLUCOSE: 139 mg/dL — ABNORMAL HIGH (ref 70–100)
~~LOC~~ BKR POC GLUCOSE: 165 mg/dL — ABNORMAL HIGH (ref 70–100)
~~LOC~~ BKR POC GLUCOSE: 182 mg/dL — ABNORMAL HIGH (ref 70–100)
~~LOC~~ BKR POC GLUCOSE: 203 mg/dL — ABNORMAL HIGH (ref 70–100)
~~LOC~~ BKR POC GLUCOSE: 173 mg/dL — ABNORMAL HIGH (ref 70–100)

## 2024-07-17 LAB — URINALYSIS DIPSTICK REFLEX TO CULTURE
~~LOC~~ BKR NITRITE: NEGATIVE
~~LOC~~ BKR PROTEIN,UA: NEGATIVE /LPF
~~LOC~~ BKR URINE BILE: NEGATIVE
~~LOC~~ BKR URINE BLOOD: NEGATIVE

## 2024-07-17 LAB — CULTURE-URINE W/SENSITIVITY: ~~LOC~~ BKR URINE CULTURE: <10

## 2024-07-17 LAB — SED RATE: ~~LOC~~ BKR ESR: 26 mm/h (ref <=30)

## 2024-07-17 LAB — C REACTIVE PROTEIN (CRP): ~~LOC~~ BKR C-REACTIVE PROTEIN: 0.2 mg/dL (ref <1.00)

## 2024-07-17 MED ORDER — BACLOFEN 10 MG PO TAB
10 mg | Freq: Three times a day (TID) | ORAL | 0 refills | Status: DC | PRN
Start: 2024-07-17 — End: 2024-07-20
  Administered 2024-07-18 – 2024-07-20 (×2): 10 mg via ORAL

## 2024-07-17 MED ORDER — ENOXAPARIN 40 MG/0.4 ML SC SYRG
40 mg | Freq: Every day | SUBCUTANEOUS | 0 refills | Status: DC
Start: 2024-07-17 — End: 2024-07-23
  Administered 2024-07-17 – 2024-07-23 (×7): 40 mg via SUBCUTANEOUS

## 2024-07-17 MED ORDER — ALBUTEROL SULFATE 90 MCG/ACTUATION IN HFAA
2 | RESPIRATORY_TRACT | 0 refills | Status: DC | PRN
Start: 2024-07-17 — End: 2024-07-23

## 2024-07-17 MED ORDER — POLYETHYLENE GLYCOL 3350 17 GRAM PO PWPK
1 | Freq: Every day | ORAL | 0 refills | Status: DC | PRN
Start: 2024-07-17 — End: 2024-07-18

## 2024-07-17 MED ORDER — INSULIN GLARGINE 100 UNIT/ML (3 ML) SC INJ PEN
15 [IU] | Freq: Every morning | SUBCUTANEOUS | 0 refills | Status: DC
Start: 2024-07-17 — End: 2024-07-17

## 2024-07-17 MED ORDER — ONDANSETRON HCL (PF) 4 MG/2 ML IJ SOLN
4 mg | INTRAVENOUS | 0 refills | Status: DC | PRN
Start: 2024-07-17 — End: 2024-07-23

## 2024-07-17 MED ORDER — ROSUVASTATIN 20 MG PO TAB
20 mg | Freq: Every day | ORAL | 0 refills | Status: DC
Start: 2024-07-17 — End: 2024-07-19
  Administered 2024-07-17: 14:00:00 20 mg via ORAL

## 2024-07-17 MED ORDER — CELECOXIB 100 MG PO CAP
200 mg | Freq: Every day | ORAL | 0 refills | Status: DC
Start: 2024-07-17 — End: 2024-07-23
  Administered 2024-07-17 – 2024-07-23 (×7): 200 mg via ORAL

## 2024-07-17 MED ORDER — BISACODYL 10 MG RE SUPP
10 mg | Freq: Every day | RECTAL | 0 refills | Status: DC | PRN
Start: 2024-07-17 — End: 2024-07-23

## 2024-07-17 MED ORDER — INSULIN GLARGINE 100 UNIT/ML (3 ML) SC INJ PEN
40 [IU] | Freq: Two times a day (BID) | SUBCUTANEOUS | 0 refills | Status: DC
Start: 2024-07-17 — End: 2024-07-23
  Administered 2024-07-17 – 2024-07-21 (×2): 40 [IU] via SUBCUTANEOUS

## 2024-07-17 MED ORDER — LEVOTHYROXINE 88 MCG PO TAB
88 ug | Freq: Every day | ORAL | 0 refills | Status: DC
Start: 2024-07-17 — End: 2024-07-23
  Administered 2024-07-17 – 2024-07-23 (×7): 88 ug via ORAL

## 2024-07-17 MED ORDER — INSULIN ASPART 100 UNIT/ML SC FLEXPEN
15 [IU] | Freq: Three times a day (TID) | SUBCUTANEOUS | 0 refills | Status: DC
Start: 2024-07-17 — End: 2024-07-23
  Administered 2024-07-17: 16:00:00 15 [IU] via SUBCUTANEOUS

## 2024-07-17 MED ORDER — IMS MIXTURE TEMPLATE
225 mg | Freq: Every day | ORAL | 0 refills | Status: DC
Start: 2024-07-17 — End: 2024-07-23
  Administered 2024-07-17 – 2024-07-23 (×14): 225 mg via ORAL

## 2024-07-17 MED ORDER — OXYCODONE 5 MG PO TAB
5 mg | ORAL | 0 refills | Status: DC | PRN
Start: 2024-07-17 — End: 2024-07-23
  Administered 2024-07-17 – 2024-07-23 (×8): 5 mg via ORAL

## 2024-07-17 MED ORDER — PREGABALIN 75 MG PO CAP
75 mg | ORAL | 0 refills | Status: DC
Start: 2024-07-17 — End: 2024-07-23
  Administered 2024-07-17 – 2024-07-23 (×18): 75 mg via ORAL

## 2024-07-17 MED ORDER — INSULIN ASPART 100 UNIT/ML SC FLEXPEN
0-6 [IU] | Freq: Before meals | SUBCUTANEOUS | 0 refills | Status: DC
Start: 2024-07-17 — End: 2024-07-23
  Administered 2024-07-19: 18:00:00 2 [IU] via SUBCUTANEOUS

## 2024-07-17 MED ORDER — VENLAFAXINE 150 MG PO CP24
150 mg | Freq: Every day | ORAL | 0 refills | Status: DC
Start: 2024-07-17 — End: 2024-07-17

## 2024-07-17 MED ORDER — DEXTROSE 50 % IN WATER (D50W) IV SYRG
12.5-25 g | INTRAVENOUS | 0 refills | Status: DC | PRN
Start: 2024-07-17 — End: 2024-07-23

## 2024-07-17 MED ORDER — LACTATED RINGERS IV BOLUS
1000 mL | Freq: Once | INTRAVENOUS | 0 refills | Status: DC
Start: 2024-07-17 — End: 2024-07-17
  Administered 2024-07-17: 18:00:00 1000 mL via INTRAVENOUS

## 2024-07-17 MED ORDER — ONDANSETRON 4 MG PO TBDI
4 mg | ORAL | 0 refills | Status: DC | PRN
Start: 2024-07-17 — End: 2024-07-23

## 2024-07-17 MED ORDER — DICLOFENAC SODIUM 1 % TP GEL
4 g | Freq: Four times a day (QID) | TOPICAL | 0 refills | Status: DC
Start: 2024-07-17 — End: 2024-07-23
  Administered 2024-07-17 – 2024-07-22 (×2): 4 g via TOPICAL

## 2024-07-17 MED ORDER — PRAMIPEXOLE 0.25 MG PO TAB
.75 mg | Freq: Every day | ORAL | 0 refills | Status: DC
Start: 2024-07-17 — End: 2024-07-22
  Administered 2024-07-17 – 2024-07-21 (×5): 0.75 mg via ORAL

## 2024-07-17 MED ORDER — ACETAMINOPHEN 325 MG PO TAB
650 mg | ORAL | 0 refills | Status: DC | PRN
Start: 2024-07-17 — End: 2024-07-23
  Administered 2024-07-17 – 2024-07-23 (×14): 650 mg via ORAL

## 2024-07-17 MED ORDER — ASPIRIN 81 MG PO CHEW
81 mg | Freq: Every day | ORAL | 0 refills | Status: DC
Start: 2024-07-17 — End: 2024-07-23
  Administered 2024-07-17 – 2024-07-23 (×7): 81 mg via ORAL

## 2024-07-17 MED ORDER — FOLIC ACID 1 MG PO TAB
4 mg | Freq: Every day | ORAL | 0 refills | Status: DC
Start: 2024-07-17 — End: 2024-07-23
  Administered 2024-07-17 – 2024-07-23 (×7): 4 mg via ORAL

## 2024-07-17 MED ORDER — FAMOTIDINE 20 MG PO TAB
40 mg | Freq: Every day | ORAL | 0 refills | Status: DC
Start: 2024-07-17 — End: 2024-07-23
  Administered 2024-07-17 – 2024-07-23 (×7): 40 mg via ORAL

## 2024-07-17 MED ORDER — LIDOCAINE 5 % TP PTMD
2 | Freq: Every day | TOPICAL | 0 refills | Status: DC
Start: 2024-07-17 — End: 2024-07-23
  Administered 2024-07-17 – 2024-07-23 (×7): 2 via TOPICAL

## 2024-07-17 MED ORDER — PREDNISONE 1 MG PO TAB
5 mg | Freq: Every day | ORAL | 0 refills | Status: DC
Start: 2024-07-17 — End: 2024-07-23
  Administered 2024-07-17 – 2024-07-23 (×7): 5 mg via ORAL

## 2024-07-17 MED ORDER — SENNOSIDES-DOCUSATE SODIUM 8.6-50 MG PO TAB
1 | Freq: Every day | ORAL | 0 refills | Status: DC | PRN
Start: 2024-07-17 — End: 2024-07-23
  Administered 2024-07-21: 15:00:00 1 via ORAL

## 2024-07-17 MED ORDER — MELATONIN 5 MG PO TAB
5 mg | Freq: Every evening | ORAL | 0 refills | Status: DC | PRN
Start: 2024-07-17 — End: 2024-07-23
  Administered 2024-07-18 – 2024-07-20 (×3): 5 mg via ORAL

## 2024-07-17 MED ORDER — MIRTAZAPINE 15 MG PO TAB
15 mg | Freq: Every day | ORAL | 0 refills | Status: DC
Start: 2024-07-17 — End: 2024-07-19
  Administered 2024-07-17 – 2024-07-19 (×2): 15 mg via ORAL

## 2024-07-17 NOTE — Progress Notes
 RT Adult Assessment Note    NAME:Hailey Rodgers             MRN: [id number]             DOB:Apr 30, 1951          AGE: 73 y.o.  ADMISSION DATE: 07/16/2024             DAYS ADMITTED: LOS: 0 days    Additional Comments:  Impressions of the patient: NAD  Intervention(s)/outcome(s): Ordered BiPAP PRN during sleep, IS for lung expansion per breath sounds, and continuous pulse ox       Vital Signs:  Pulse: 70  RR: 18 PER MINUTE  SpO2: 98 %  O2 Device: Nasal cannula  Liter Flow: 1 Lpm  O2%:      Breath Sounds:   Right Apex Breath Sounds: Clear (Implies normal)  Right Base Breath Sounds: Decreased  Left Apex Breath Sounds: Clear (Implies normal)  Left Base Breath Sounds: Decreased  All Breath Sounds: Clear (Implies normal);Decreased  Respiratory Effort:   Respiratory Effort/Pattern: Unlabored  Comments:

## 2024-07-17 NOTE — Care Coordination-Inpatient
 Med Private Night [phone] will take calls on this patient until 8 AM of 07/17/2024.     Afterwards, please contact Med 3 for any questions or concerns. Voalte is preferred for communication.     Fred Villa-Chan, MD  Night AOD

## 2024-07-17 NOTE — Consults
 Neurosurgery Consult History and Physical Note      Admission Date: 07/16/2024                                                LOS: 0 days    Reason for Consult:  Lower extremity weakness, prior T12-pelvis fusion    Consult type: Co-Management w/Signed Orders    Consulting Physician: Neurosurgery Attendings: Curtistine Corners, MD    Requesting Physician: Lauraine ONEIDA Heft, DO      Assessment/Plan:  Hailey Rodgers is a 73 y.o. female with hx of prior T12-pelvis fusion and decompression (03/04/24 @ [facility]) presents today with worsening lower extremity weakness and pain, now unable to ambulate. Neurosurgery consulted given history of prior T12-pelvis fusion. Imaging demonstrates known T12 periprosthetic fracture and pedicle screw loosening with stable T11-12 STIR changes. No new fractures from prior studies. On exam patient is motor intact with bilateral diminished sensation to light touch.    PTA [id number] for primary prevention    - No acute neurosurgical intervention  - No brace necessary from NSGY standpoint  - Pain control per primary  - Please call with any neurosurgical questions  - Will discuss with staff in AM    Penne Abbott, MD    Please page [phone] with questions.  _____________________________________________________________________    History of Present Illness:   Hailey Rodgers is a 73 y.o. female with hx of prior T12-pelvis fusion and decompression (03/04/24 @ [facility]) presents today with worsening lower extremity weakness and pain, now unable to ambulate. Neurosurgery consulted given history of prior T12-pelvis fusion.    She explains she attempted to go to [facility] a few days ago, sat in ER for 3 hours and ended up leaving. Reports falling on 24th, reports she was walking with her walker and she went down twice that day, landing on her side and then back the second time. Since her fall she reports worsening b/l lower extremity numbness/weakness, though explains she feels fine while flat in bed during my visit.  Denies any new bowel or bladder incontinence. No saddle anesthesia.    Past Medical History:  Past Medical History:    Age-related physical debility    Allergic rhinitis    Arthritis    Asthma    Bronchitis    Cancer (CMS-HCC)    Cervical stenosis of spinal canal    Convulsion (CMS-HCC)    Coronary artery disease    COVID-19 virus infection    Disorganized thinking    Diverticulosis    DM (diabetes mellitus) (CMS-HCC)    Dyslipidemia    Dyspnea    Fatty liver disease, nonalcoholic    Gastroesophageal reflux disease without esophagitis    GERD (gastroesophageal reflux disease)    Hypertriglyceridemia    Hypothyroidism    Hypothyroidism due to acquired atrophy of thyroid    Infection    Leg weakness    Lumbar stenosis    Lung disease    Lung nodule    Memory loss    Movement disorder    Narcolepsy    Obesity    On supplemental oxygen therapy    OSA (obstructive sleep apnea)    Other dysphagia    Other hyperlipidemia    Peripheral neuropathy    Polypharmacy    Postmenopausal    Pseudoseizures    Recurrent major  depressive disorder, in partial remission    Rhabdomyolysis    Rheumatoid arthritis (CMS-HCC)    Seizures (CMS-HCC)    Sleep disorder    Syncope and collapse    Type 2 diabetes mellitus with hyperglycemia, with long-term current use of insulin  (CMS-HCC)    Unspecified deficiency anemia    Urinary incontinence    Urinary problem    Vision decreased       Past Surgical History:  Surgical History:   Procedure Laterality Date    HX ADENOIDECTOMY  1962    HX TONSILLECTOMY  1962    TUBAL LIGATION  1977    RECTAL SURGERY  1979    for rectal prolapse; bladder repair    CHOLECYSTECTOMY  1984    HYSTERECTOMY  2000    BREAST BIOPSY Right 2012    COLONOSCOPY  2016    CERVICAL 3 TO THORACIC 7 DECOMPRESSION AND FUSION Bilateral 07/18/2015    Performed by Vivica Domino, MD at [facility] OR    BLEPHAROPLASTY      CARDIOVASCULAR STRESS TEST      CERVIX SURGERY  2016    ECHOCARDIOGRAM PROCEDURE  2022 ELECTROCARDIOGRAM      EVENT MONITOR      Have Loop monitor    HERNIA REPAIR  1984/2007    SEPTOPLASTY  1986/2013    TRANSESOPHAGEAL ECHO      UPPER GASTROINTESTINAL ENDOSCOPY         Social History:  Social History     Tobacco Use    Smoking status: Never    Smokeless tobacco: Never   Vaping Use    Vaping status: Never Used   Substance Use Topics    Alcohol use: Never    Drug use: Never       Family History:  Family History   Problem Relation Name Age of Onset    Cancer Mother [name]     Thyroid Disease Mother [name]     Hypertension Mother Psychiatric nurse     Cataract Mother [name]     Cancer Father RL         pancreatic    Cancer Other [name]     Stroke Paternal Grandfather Benjiamin!     Cancer Paternal Aunt      Coronary Artery Disease Paternal Grandmother      Diabetes Daughter [name]     Migraines Daughter [name]     Strabismus Neg Hx      Retinal Detachment Neg Hx      Neurologic Disorder Neg Hx      Macular Degen Neg Hx      Glaucoma Neg Hx      Blindness Neg Hx      Amblyopia Neg Hx      Autoimmune Disease Neg Hx         Allergies:    Amoxicillin-pot clavulanate, Gemfibrozil, Morphine , Trazodone, Adhesive tape (rosins), Atorvastatin, Latex, Bupropion, Cymbalta [duloxetine], and Levaquin [levofloxacin]    Medications:  PTA:  Current Outpatient Medications   Medication Instructions    acetaminophen  (TYLENOL  EXTRA STRENGTH) 1,000 mg, Oral, EVERY  6 HOURS PRN, Max of 4,000 mg of acetaminophen  in 24 hours.    albuterol  sulfate (PROAIR  HFA) 90 mcg/actuation HFA aerosol inhaler Inhale two puffs by mouth into the lungs every 6 hours as needed for Wheezing for 5 days.    aspirin  81 mg, Oral, DAILY    baclofen  (LIORESAL ) 10 mg, Oral, THREE TIMES DAILY PRN    bisacodyL  (DULCOLAX (BISACODYL )) 10 mg, Rectal,  DAILY  PRN    blood pressure test kit-Large kit with cuff Take blood pressure as directed by physician.    calcium  carbonate (CALTRATE 600) 1,200 mg, Oral, TWICE DAILY    celecoxib  (CELEBREX ) 200 mg, Oral, DAILY CHOLEcalciferoL  (vitamin D3) (DIALYVITE VITAMIN D ) 5,000 Units, Oral, EVERY SUNDAY    diclofenac  sodium (VOLTAREN  ARTHRITIS PAIN) 1 % topical gel Apply 2.25 in topically to affected area four times daily for 30 days    diclofenac  sodium (VOLTAREN ) 1 % topical gel Apply 2 g topically 4 (four) times a day as needed.    dicyclomine  (BENTYL ) 20 mg tablet Take one tablet by mouth every 6-8 hours as needed for pain.    digital body weight scale Weigh daily in the morning.    docusate (COLACE) 200 mg, TWICE DAILY    dulaglutide  (TRULICITY ) 1.5 mg/0.5 mL injection pen Inject 1.5 mg under the skin every 7 (seven) days.    famotidine  (PEPCID ) 40 mg, Oral, DAILY    ferrous sulfate  (IRON ) 325 mg, Oral, THREE TIMES WEEKLY, Take on an empty stomach at least 1 hour before or 2 hours after food. Take on Monday, Wednesday and Fridays    fexofenadine (ALLEGRA) 180 mg, Oral, DAILY    fluconazole  (DIFLUCAN ) 100 mg tablet Take one tablet by mouth daily    fluticasone  propionate (FLONASE  ALLERGY RELIEF) 50 mcg/actuation nasal spray, suspension Apply one spray to each nostril as directed twice daily for 5 days.    folic acid  (FOLVITE ) 4 mg, Oral, DAILY    [Paused] GEMTESA  75 mg, Oral, AT BEDTIME DAILY    hydrocortisone  acetate (ANUSOL -HC) 25 mg, Rectal, TWICE DAILY    insulin  aspart (U-100) (NOVOLOG  FLEXPEN U-100 INSULIN ) 15 Units, Subcutaneous, THREE TIMES DAILY BEFORE MEALS    insulin  lispro (U-100) (HUMALOG  KWIKPEN INSULIN ) 100 unit/mL subcutaneous PEN Inject 30 units under the skin with meals 3 times daily plus correction factor of 1 on a sliding scale as directed.    lactobacillus rhamnosus GG (CULTURELLE) 15 billion cell capsule 1 capsule, DAILY    LANTUS  SOLOSTAR U-100 INSULIN  100 unit/mL (3 mL) subcutaneous PEN Inject fifteen Units under the skin every morning AND fifteen Units at bedtime daily. Indications: type 2 diabetes mellitus    LANTUS  SOLOSTAR U-100 INSULIN  40 Units, Subcutaneous, TWICE DAILY    levothyroxine  (SYNTHROID ) 88 mcg, Oral, DAILY    lidocaine  (LIDODERM ) 5 % topical patch Place 1 patch on the skin daily. Remove & Discard patch within 12 hours or as directed by MD    lidocaine  (LIDODERM ) 5 % topical patch 1 patch, Topical, DAILY, Apply patch for 12 hours, then remove for 12 hours before repeating.    methotrexate  sodium 25 mg, Oral, EVERY MONDAY    milk of magnesium  (MILK OF MAGNESIA) 400 mg/5 mL oral suspension Take 30 mL by mouth daily as needed for constipation.    mirtazapine  (REMERON ) 15 mg tablet Take 1 tablet (15 mg total) by mouth nightly.    mirtazapine  15 mg, Oral, AT BEDTIME DAILY    multivitamin with folic acid  (TAB-A-VITE) 400 mcg tab 400 mcg, Oral, DAILY    mupirocin  (BACTROBAN ) 2 % topical ointment Apply 1 application in the nostrils twice a day    naloxone  (NARCAN ) 4 mg/actuation nasal spray Give 1 spray in one nostril if opioid overdose suspected. If no response in 3 minutes, repeat dose in other nostril with 2nd spray device.    nifedipine  0.3%/lidocaine  1.5%/white petrolatum (#) Apply to hemorrhoids QID PRN    ondansetron  (ZOFRAN   ODT) 8 mg rapid dissolve tablet 1 tablet, EVERY  8 HOURS PRN    oxyCODONE  (ROXICODONE ) 5 mg tablet Take one tablet by mouth every 6 hours as needed for pain.    oxyCODONE  (ROXICODONE ) 5 mg, Oral, EVERY  6 HOURS PRN    polyethylene glycol 3350  (MIRALAX ) 17 g, Oral, TWICE DAILY PRN    pramipexole  (MIRAPEX ) 0.75 mg, Oral, DAILY    predniSONE  (DELTASONE ) 5 mg, Oral, DAILY    pregabalin  (LYRICA ) 25 mg, Oral, THREE TIMES DAILY    pregabalin  (LYRICA ) 50 mg, Oral, EVERY  8 HOURS    rosuvastatin  (CRESTOR ) 20 mg, Oral, DAILY    sennosides-docusate sodium  (SENNA-S) 8.6/50 mg tablet 1 tablet, Oral, DAILY    sodium chloride  (SEA MIST) 0.65 % nasal spray 2 sprays, Each Nostril, AS NEEDED    TRULICITY  3 mg/0.5 mL injection pen     venlafaxine  XR (EFFEXOR  XR) 75 mg capsule Take 3 capsules (225 mg total) by mouth daily.    venlafaxine  XR (EFFEXOR  XR) 150 mg, Oral, DAILY    vitamins, multi w/minerals 9 mg iron -400 mcg tab 1 tablet, DAILY        Inpatient:  Scheduled Meds:Continuous Infusions:  PRN and Respiratory Meds:        Physical Exam:  Vital Signs:  Last Filed in 24 hours Vital Signs:  24 hour Range    BP: 103/67 (07/26 0130)  Temp: 36.6 ?C (97.9 ?F) (07/25 1718)  Pulse: 66 (07/26 0130)  Respirations: 21 PER MINUTE (07/26 0130)  SpO2: 100 % (07/26 0130)  O2 Device: None (Room air) (07/25 1718) BP: (103-150)/(62-76)   Temp:  [36.3 ?C (97.3 ?F)-36.6 ?C (97.9 ?F)]   Pulse:  [60-81]   Respirations:  [10 PER MINUTE-21 PER MINUTE]   SpO2:  [93 %-100 %]   O2 Device: None (Room air)     General appearance: No acute distress    Neurologic Exam:   Mental Status: Awake, alert and oriented x 4, fluent speech, normal cognition  Pupils: Pupils equal round and reactive to light  Cranial Nerves: CN II-XII individually tested and found to be intact, gag not tested  Motor:   AA EF EE WF WE FF FE FA G HF KF KE DF PF EHL   Left 5 5 5 5 5 5 5 5 5 5 5 5 5 5 5    Right 5 5 5 5 5 5 5 5 5 5 5 5 5 5 5    Normal muscle bulk and tone  No pronator drift  Sensation: Diminished sensation in b/l LE's  Deep Tendon Reflexes:  2+ patellar b/l  No clonus bilaterally  No hoffman's sign bilaterally  Gait: Not assessed    LabTests:  Hematology:    Lab Results   Component Value Date    HGB 12.8 07/16/2024    HGB 12.7 09/03/2023    HCT 37.9 07/16/2024    HCT 38.6 09/03/2023    PLTCT 248 07/16/2024    PLTCT 251 09/03/2023    WBC 7.00 07/16/2024    WBC 6.5 09/03/2023    NEUT 59.8 07/16/2024    NEUT 67 09/03/2023    ANC 4.20 07/16/2024    ANC 4.40 09/03/2023    ALC 1.90 07/16/2024    ALC 1.07 09/03/2023    MONA 7.9 07/16/2024    MONA 10 09/03/2023    AMC 0.60 07/16/2024    AMC 0.67 09/03/2023    EOSA 5.1 07/16/2024    EOSA 5 09/03/2023  ABC 0.10 07/16/2024    ABC 0.05 09/03/2023    MCV 81.6 07/16/2024    MCV 88.2 09/03/2023    MCH 27.5 07/16/2024    MCH 29.1 09/03/2023    MCHC 33.7 07/16/2024    MCHC 33.0 09/03/2023    MPV 8.4 07/16/2024 MPV 8.1 09/03/2023    RDW 18.9 07/16/2024    RDW 14.5 09/03/2023     General Chemistry:   Lab Results   Component Value Date    NA 135 07/16/2024    NA 141 09/03/2023    K 4.7 07/16/2024    K 4.1 09/03/2023    CL 97 07/16/2024    CL 104 09/03/2023    CO2 29 07/16/2024    CO2 25 09/03/2023    BUN 23 07/16/2024    BUN 28 09/03/2023    CR 0.52 07/16/2024    CR 0.77 09/03/2023    GLU 238 07/16/2024    GLU 149 09/03/2023    GLU 90 11/24/2004    OBSCA 1.15 07/18/2015    CA 9.5 07/16/2024    CA 10.0 09/03/2023    MG 1.9 06/27/2024    MG 2.1 06/10/2023    PO4 4.1 06/27/2024    PO4 4.6 06/10/2023     General Chemistry:   Lab Results   Component Value Date    GAP 9 07/16/2024    GAP 12 09/03/2023    ALBUMIN 4.2 07/16/2024    ALBUMIN 4.3 09/03/2023    TOTBILI 0.5 07/16/2024    TOTBILI 0.4 09/03/2023    DBILI <0.1 05/28/2023    TOTPROT 7.5 07/16/2024    TOTPROT 7.2 09/03/2023    LIPASE 28 06/23/2024    AST 65 07/16/2024    AST 43 09/03/2023    ALT 76 07/16/2024    ALT 36 09/03/2023    ALKPHOS 131 07/16/2024    ALKPHOS 106 09/03/2023    LDH 276 09/03/2023       Radiology and other Diagnostics Review:    Imaging reviewed      Penne Abbott, MD  07/17/2024  Pager [phone]

## 2024-07-17 NOTE — Progress Notes
 Brief Progress Note     Pertinent imaging reviewed w/ attending Dr. Delmer     Recommendations:   - No acute neurosurgical intervention  - No brace necessary from NSGY standpoint  - Pain control per primary  - Rest of care per primary  - Recommend outpatient follow up with original spine surgery @ [facility]  Thank you for involving neurosurgery in this patient's care. We will sign off at this time.  Please reach out to [phone] if there are further neurosurgical questions or concerns.       Penne Abbott, M.D.  Neurological Surgery  Pager: [phone]

## 2024-07-18 LAB — POC GLUCOSE
~~LOC~~ BKR POC GLUCOSE: 183 mg/dL — ABNORMAL HIGH (ref 70–100)
~~LOC~~ BKR POC GLUCOSE: 137 mg/dL — ABNORMAL HIGH (ref 70–100)
~~LOC~~ BKR POC GLUCOSE: 156 mg/dL — ABNORMAL HIGH (ref 70–100)
~~LOC~~ BKR POC GLUCOSE: 91 mg/dL (ref 70–100)

## 2024-07-18 LAB — CBC AND DIFF
~~LOC~~ BKR ABSOLUTE BASO COUNT: 0 10*3/uL (ref 0.00–0.20)
~~LOC~~ BKR ABSOLUTE EOS COUNT: 0.3 10*3/uL — ABNORMAL HIGH (ref 0.00–0.45)
~~LOC~~ BKR ABSOLUTE NEUTROPHIL: 3.4 10*3/uL (ref 1.80–7.00)
~~LOC~~ BKR BASOPHILS %: 0.8 % — ABNORMAL HIGH (ref 0.0–2.0)
~~LOC~~ BKR EOSINOPHILS %: 5.1 % — ABNORMAL HIGH (ref 0.0–5.0)
~~LOC~~ BKR HEMATOCRIT: 32 % — ABNORMAL LOW (ref 36.0–45.0)
~~LOC~~ BKR MONOCYTES %: 10 % (ref 4.0–12.0)

## 2024-07-18 LAB — COMPREHENSIVE METABOLIC PANEL
~~LOC~~ BKR GLOMERULAR FILTRATION RATE (GFR): >60 mL/min (ref >60–5.1)
~~LOC~~ BKR SODIUM, SERUM: 137 mmol/L — ABNORMAL LOW (ref 137–147)

## 2024-07-18 MED ORDER — POLYETHYLENE GLYCOL 3350 17 GRAM PO PWPK
1 | Freq: Two times a day (BID) | ORAL | 0 refills | Status: DC
Start: 2024-07-18 — End: 2024-07-20
  Administered 2024-07-19: 14:00:00 17 g via ORAL

## 2024-07-18 MED ORDER — MAGNESIUM HYDROXIDE 400 MG/5 ML PO SUSP
30 mL | Freq: Once | ORAL | 0 refills | Status: CP
Start: 2024-07-18 — End: ?
  Administered 2024-07-18: 18:00:00 30 mL via ORAL

## 2024-07-18 NOTE — Progress Notes
 Internal Medicine Daily Progress Note      Patient's Name:  Hailey Rodgers MRN: [id number]   Today's Date:  07/18/2024  Admission Date: 07/16/2024  LOS: 0 days    Problem list  Principal Problem:    Recurrent falls  Active Problems:    Generalized anxiety disorder    Frequent falls    Rheumatoid arthritis (CMS-HCC)    Cervical radiculopathy at C8    Hypothyroidism (acquired)    Coronary artery calcification    Chronic heart failure with preserved ejection fraction (CMS-HCC)    Back pain with sciatica    Brief Hospital Course     Hailey Rodgers is a 73 y.o. female with history of rheumatoid arthritis on chronic predniosone,  OSA on BIPAP, chronic heart failure with preserved EF,  CAD , HTN, dyslipidemia, IDDM type 2, history of urinary retentions ,  history of b  T12-iliac decompression and fusion ~ 01/2024  presented to [place] ED on 06/23/24 for fever along with acute on chronic back pain, diarrhea, and foul urine/incontinence.  Patient was recently discharged from inpatient rehab where she was recovering from spinal surgery.  She fell twice after discharge and had urinary incontinence since the fall.  MRI T-spine and L-spine showed known periprosthetic fracture of T12 and pedicle screw loosening.  Also showed scalloped inferior endplate height loss of T11 and mild marrow edema of the T11 vertebral body.  T12 fracture plan likely represented benign edema secondary to compression injuries, junctional mechanical factors, and/or reactive osteitis.  Superinfection was ruled out as a possibility.  Neurosurgery was consulted in the ER, recommended no acute surgical intervention.    She was admitted to the inpatient medicine floor on 7/25.  She presented with worsening weakness in the lower extremities as well as urinary incontinence.  Pain was generally under control with her PTA regimen.  On 7/26, patient was noted to have urinary retention and difficulty with micturition.  She was intermittently cathetered, removing 1300 mL of urine.  Patient experienced some hypotension with her systolics dropping to the 80s, but this was resolved with 500 mL LR bolus.  BP later normalized to systolic 110. Later, a Foley catheter was placed to prevent recurrent CIC's.  Interventional pain management was consulted on 7/27.    Interval Updates - Today 07/18/2024:  > Foley catheter placed 7/26  > Interventional pain consulted  > NSGY signed off, no intervention    Assessment and Plan      # Recurrent falls   # Acute on Chronic back pain with lower extremities weakness and physical decondition   # Hx of T12 iliac decompression/fusion on 01/2024  - Patient presented after frequent reported falls and worsening weakness in the lower extremities associated with numbness and tingling.   - Patient has reported two falls over the last couple days   - Patient was recently discharged to rehab for a similar issues   - She uses a walker for mobility and has been experiencing pain since her time in rehab  - Stable vitals with labs notable for mild LFT elevation   - UA with 2-10 WBC leukocytes (Recently treated for UTI).    - MRI T spine Long segment spinal posterior instrumentation spanning T12 through the sacroiliac joints. Known periprosthetic fracture of T12 and pedicle screw loosening are better evaluated on prior CT abdomen and pelvis. 2.  Similar scalloped inferior endplate height loss of T11 with mild marrow edema of the T11 vertebral body and fluid within  the T12 fracture plane again likely represent benign edema secondary to compression injuries, junctional mechanical factors, and reactive osteitis. Superinfection is again considered much less likely, particularly given absent secondary paraspinous or epidural inflammatory findings (evaluation somewhat limited by noncontrast imaging). 3.  Residual thoracic and lumbar spondylosis as described, without definite high-grade spinal stenosis.   - ESR 26, CRP 0.23  - Foley catheter placed 7/26 after CIC of 1300 mL  PLAN  > NSGY consulted in the ER, no plan for acute intervention  > Pain Regimen:   > Continue PTA  Lyrica  TID, Lidocaine  patch, Voltaren  gel, Tylenol  PRN, Oxycodone  PRN, Baclofen  PRN  > Neuro checks every 12 hours, delirium precautions  > PT/OT consulted   > Consider outpatient follow-up for EMG  > Interventional Pain consulted, appreciate recs     # HLD  # Chronic heart failure with preserved EF  # OSA on BIPAP  PLAN  > Hold Crestor   > Hold Bumex  and Spirolactone  > Continue PTA Aspirin   > Continue PTA BIPAP (auto titration, no oxygen bleed)     # IDDM Type 2  PLAN  > Lantus  40 units twice daily  > Aspart 15 units 3 times daily before meals  > LDCF, monitor POC glucose     # RLS  # Depression  # 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   PLAN  > Continue PTA Effexor , Remeron   > Continue PTA Mirapex  RLS     # Rheumatoid arthritis   PLAN  > Hold PTA methotrexate    > Continue PTA Folic acid , Prednisone  5 mg daily  > Continue PTA Celebrex  daily      # Hypothyroidism   PLAN  > Continue PTA synthroid     # GERD   PLAN  > Continue PTA H2 Blocker    FEN:  > DIET DIABETIC (STANDARD) CONSISTENT CARB  > IVF: None    Prophylaxis Review:  VTE ppx: Lovenox  40 mg  GI ppx: H2 blocker  Bowel Regimen: Miralax  1 pkt BID, Senna PRN, Milk of Mag as needed  Last bowel movement:  (PTA)    Code Status:  Full Code  Disposition: Home, pending    Seen and discussed with Dr. Reyes Hailey Penton, DO     Mitchael Edge, DO  Preliminary Medicine - PGY1  Pager [phone]  [facility]      Subjective:     Patient feels fine this morning.  States she now has an indwelling catheter.  She also explains that she has a mix of urinary retention and incontinence while at home.  Retention occurs primarily when she is lying down, incontinence occurs when she is moving around.  Claims that she often overexerts herself which can lead to weakness and her knees buckling.  Pain is well-controlled today.  No acute concerns.    Objective:       BP: (81-113)/(52-72)   Temp:  [36.7 ?C (98.1 ?F)-37 ?C (98.6 ?F)]   Pulse:  [59-79]   Respirations:  [16 PER MINUTE]   SpO2:  [92 %-98 %]   O2 Device: Nasal cannula  O2 Liter Flow: 2 Lpm    Physical Exam  Constitutional:       Appearance: Normal appearance.   HENT:      Head: Normocephalic and atraumatic.      Right Ear: External ear normal.      Left Ear: External ear normal.      Nose: Nose normal.   Eyes:  Extraocular Movements: Extraocular movements intact.      Pupils: Pupils are equal, round, and reactive to light.   Cardiovascular:      Rate and Rhythm: Normal rate and regular rhythm.   Pulmonary:      Effort: Pulmonary effort is normal.      Breath sounds: Normal breath sounds.   Abdominal:      Palpations: Abdomen is soft.   Skin:     General: Skin is warm and dry.   Neurological:      General: No focal deficit present.      Mental Status: She is alert and oriented to person, place, and time. Mental status is at baseline.      Sensory: Sensation is intact.      Motor: Weakness (LLE HF 4/5) present.   Psychiatric:         Mood and Affect: Mood is anxious.            Intake/Output Summary (Last 24 hours) at 07/18/2024 0757  Last data filed at 07/18/2024 0142  Gross per 24 hour   Intake 1420 ml   Output 2725 ml   Net -1305 ml      Recent Labs     07/16/24  1705 07/17/24  1216 07/18/24  0333   HGB 12.8 12.0 10.9*   WBC 7.00 6.70 6.40   PLTCT 248 221 191   NA 135* 136* 137   K 4.7 4.5 4.0   CL 97* 102 103   CO2 29 27 26    BUN 23 26* 23   CR 0.52 0.76 0.56   GLU 238* 187* 177*   CA 9.5 9.2 8.9   MG  --   --  2.0   ALBUMIN 4.2 3.6 3.5   AST 65* 46* 32   ALT 76* 62* 50   ALKPHOS 131* 113* 112*   TOTBILI 0.5 0.4 0.3       No results for input(s): BNP, HIGHSTROPI, HSTROP0HR, HSTROP2HR, HSTROPDELTA in the last 72 hours.     No image results found.     Meds:  Scheduled Meds:aspirin  chewable tablet 81 mg, 81 mg, Oral, QDAY  celecoxib  (CeleBREX ) capsule 200 mg, 200 mg, Oral, QDAY  diclofenac  sodium (VOLTAREN ) 1 % topical gel 4 g, 4 g, Topical, QID  enoxaparin  (LOVENOX ) syringe 40 mg, 40 mg, Subcutaneous, QDAY(21)  famotidine  (PEPCID ) tablet 40 mg, 40 mg, Oral, QDAY  folic acid  (FOLVITE ) tablet 4 mg, 4 mg, Oral, QDAY  insulin  aspart (U-100) (NOVOLOG  FLEXPEN U-100 INSULIN ) injection PEN 0-6 Units, 0-6 Units, Subcutaneous, ACHS (22)  insulin  aspart (U-100) (NOVOLOG  FLEXPEN U-100 INSULIN ) injection PEN 15 Units, 15 Units, Subcutaneous, TID before meals  insulin  glargine (LANTUS  SOLOSTAR U-100 INSULIN ) injection PEN 40 Units, 40 Units, Subcutaneous, BID  levothyroxine  (SYNTHROID ) tablet 88 mcg, 88 mcg, Oral, QDAY  lidocaine  (LIDODERM ) 5 % topical patch 2 patch, 2 patch, Topical, QDAY  mirtazapine  (REMERON ) tablet 15 mg, 15 mg, Oral, QDAY  pramipexole  (MIRAPEX ) tablet 0.75 mg, 0.75 mg, Oral, QDAY  predniSONE  (DELTASONE ) tablet 5 mg, 5 mg, Oral, QDAY  pregabalin  (LYRICA ) capsule 75 mg, 75 mg, Oral, Q8H*  [Held by Provider] rosuvastatin  (CRESTOR ) tablet 20 mg, 20 mg, Oral, QDAY  venlafaxine  XR (EFFEXOR  XR) capsule 225 mg, 225 mg, Oral, QDAY    Continuous Infusions:  PRN and Respiratory Meds:acetaminophen  Q4H PRN, albuterol  sulfate Q6H PRN, baclofen  TID PRN, bisacodyL  QDAY PRN, dextrose  50% (D50) IV PRN, melatonin QHS PRN, ondansetron  Q6H PRN **OR** ondansetron  (ZOFRAN )  IV Q6H PRN, oxyCODONE  Q6H PRN, polyethylene glycol 3350  QDAY PRN, sennosides-docusate sodium  QDAY PRN      Wounds:        Nutrition:  Malnutrition Details:                                        Intake/Output Summary:  (Last 24 hours)    Intake/Output Summary (Last 24 hours) at 07/18/2024 0757  Last data filed at 07/18/2024 0142  Gross per 24 hour   Intake 1420 ml   Output 2725 ml   Net -1305 ml                 Radiology/Other Diagnostic Tests:    MRI T-Spine/L-Spine 07/17/24    IMPRESSION   1. Long segment spinal posterior instrumentation spanning T12 through the   sacroiliac joints. Known periprosthetic fracture of T12 and pedicle screw   loosening are better evaluated on prior CT abdomen and pelvis.   2.  Similar scalloped inferior endplate height loss of T11 with mild   marrow edema of the T11 vertebral body and fluid within the T12 fracture   plane again likely represent benign edema secondary to compression   injuries, junctional mechanical factors, and reactive osteitis.   Superinfection is again considered much less likely, particularly given   absent secondary paraspinous or epidural inflammatory findings (evaluation   somewhat limited by noncontrast imaging).   3.  Residual thoracic and lumbar spondylosis as described, without   definite high-grade spinal stenosis. Evaluation of the lumbar spinal canal   is limited due to hardware artifact.

## 2024-07-18 NOTE — Consults
 Pain Medicine Consult Note      Admission Date: 07/16/2024                                                LOS: 0 days    Reason for Consult:  Interventional pain evaluation    Consult type: Written opinion only    Assessment/Plan    Hailey Rodgers is a 73 y.o. female with weakness in her lower extremities and urinary retention    -No indication for axial injection at this time  -Patient has had epidural steroid in the past that she states made things worse  -Could benefit from lumbar RFA, but must be done outpatient as majority of her pain seems to be related to facet joints      Patient can follow up as an outpatient in the [facility] for ongoing evaluation and treatment by calling [phone]  Thank you for this consult. Please page with questions.    Jayson Leontine Dayhoff MD  Pain Fellow, PGY5  Attending:      ______________________________________________________________________    History of Present Illness: Hailey Rodgers is a 73 y.o. female with past medical history as documented below. We are consulted for interventional pain treatment. Presented to ED on 7/25 for worsening weakness in the lower extremities. Also endorsing urinary incontinence.Patient was evaluated by neurosurgery and has no plans for acute surgical intervention. Patient has had a foley placed for urinary retention. MRI L Spine and T spine performed and results as stated below.     Past Medical History:  Past Medical History:    Age-related physical debility    Allergic rhinitis    Arthritis    Asthma    Bronchitis    Cancer (CMS-HCC)    Cervical stenosis of spinal canal    Convulsion (CMS-HCC)    Coronary artery disease    COVID-19 virus infection    Disorganized thinking    Diverticulosis    DM (diabetes mellitus) (CMS-HCC)    Dyslipidemia    Dyspnea    Fatty liver disease, nonalcoholic    Gastroesophageal reflux disease without esophagitis    GERD (gastroesophageal reflux disease)    Hypertriglyceridemia Hypothyroidism    Hypothyroidism due to acquired atrophy of thyroid    Infection    Leg weakness    Lumbar stenosis    Lung disease    Lung nodule    Memory loss    Movement disorder    Narcolepsy    Obesity    On supplemental oxygen therapy    OSA (obstructive sleep apnea)    Other dysphagia    Other hyperlipidemia    Peripheral neuropathy    Polypharmacy    Postmenopausal    Pseudoseizures    Recurrent major depressive disorder, in partial remission    Rhabdomyolysis    Rheumatoid arthritis (CMS-HCC)    Seizures (CMS-HCC)    Sleep disorder    Syncope and collapse    Type 2 diabetes mellitus with hyperglycemia, with long-term current use of insulin  (CMS-HCC)    Unspecified deficiency anemia    Urinary incontinence    Urinary problem    Vision decreased       Family History:  Family History   Problem Relation Name Age of Onset    Cancer Mother [name]     Thyroid Disease Mother Mier  Hypertension Mother [name]     Cataract Mother [name]     Cancer Father RL         pancreatic    Cancer Other [name]     Stroke Paternal [name]     Cancer Paternal Aunt      Coronary Artery Disease Paternal Grandmother      Diabetes Daughter [name]     Migraines Daughter [name]     Strabismus Neg Hx      Retinal Detachment Neg Hx      Neurologic Disorder Neg Hx      Macular Degen Neg Hx      Glaucoma Neg Hx      Blindness Neg Hx      Amblyopia Neg Hx      Autoimmune Disease Neg Hx         Social History:  Lives in [place] [id number]    Social History     Socioeconomic History    Marital status: Married    Number of children: 2   Occupational History    Occupation: DISABLE    Tobacco Use    Smoking status: Never    Smokeless tobacco: Never   Vaping Use    Vaping status: Never Used   Substance and Sexual Activity    Alcohol use: Never    Drug use: Never    Sexual activity: Not Currently     Partners: Male     Birth control/protection: Post-menopausal, None       Allergies:  Allergies   Allergen Reactions Amoxicillin-Pot Clavulanate HIVES, ITCHING and URTICARIA    Gemfibrozil HIVES    Morphine  MENTAL STATUS CHANGES     Patient became unresponsive within 1 minute of 4 mg of morphine .  Did not become apneic.  Required narcan     Trazodone HALLUCINATIONS    Adhesive Tape (Rosins) RASH    Atorvastatin SEE COMMENTS     States it caused her to have a fatty liver.   Reaction: LFT elevation; Comment: LIPITOR  Reaction: LFT elevation; Comment: LIPITOR  Reaction: LFT elevation; Comment: LIPITOR  Reaction: LFT elevation; Comment: LIPITOR      Latex RASH    Bupropion SEE COMMENTS     Allergy recorded in SMS: WELLBUTRIN~Reactions: ABNORMAL SEIZUR    Cymbalta [Duloxetine] UNKNOWN    Levaquin [Levofloxacin] DIARRHEA       Medications:    Current Facility-Administered Medications:     acetaminophen  (TYLENOL ) tablet 650 mg, 650 mg, Oral, Q4H PRN, Drucella Quant, MD, 650 mg at 07/18/24 1021    albuterol  sulfate (PROAIR  HFA) inhaler 2 puff, 2 puff, Inhalation, Q6H PRN, Claudie, Mohammad A, MD    aspirin  chewable tablet 81 mg, 81 mg, Oral, QDAY, Khader, Mohammad A, MD, 81 mg at 07/18/24 9074    baclofen  (LIORESAL ) tablet 10 mg, 10 mg, Oral, TID PRN, Claudie Re A, MD, 10 mg at 07/17/24 2048    bisacodyL  (DULCOLAX) rectal suppository 10 mg, 10 mg, Rectal, QDAY PRN, Claudie, Mohammad A, MD    celecoxib  (CeleBREX ) capsule 200 mg, 200 mg, Oral, QDAY, Khader, Mohammad A, MD, 200 mg at 07/18/24 9074    dextrose  50% (D50) syringe 25-50 mL, 12.5-25 g, Intravenous, PRN, Claudie, Mohammad A, MD    diclofenac  sodium (VOLTAREN ) 1 % topical gel 4 g, 4 g, Topical, QID, Khader, Mohammad A, MD, 4 g at 07/18/24 0929    enoxaparin  (LOVENOX ) syringe 40 mg, 40 mg, Subcutaneous, QDAY(21), Claudie Re LABOR, MD, 40 mg at 07/17/24 2058  famotidine  (PEPCID ) tablet 40 mg, 40 mg, Oral, QDAY, Khader, Mohammad A, MD, 40 mg at 07/18/24 9074    folic acid  (FOLVITE ) tablet 4 mg, 4 mg, Oral, QDAY, Khader, Mohammad A, MD, 4 mg at 07/18/24 9072    insulin  aspart (U-100) (NOVOLOG  FLEXPEN U-100 INSULIN ) injection PEN 0-6 Units, 0-6 Units, Subcutaneous, ACHS (22), Khader, Mohammad A, MD    insulin  aspart (U-100) (NOVOLOG  FLEXPEN U-100 INSULIN ) injection PEN 15 Units, 15 Units, Subcutaneous, TID before meals, Claudie Re A, MD, 15 Units at 07/18/24 1021    insulin  glargine (LANTUS  SOLOSTAR U-100 INSULIN ) injection PEN 40 Units, 40 Units, Subcutaneous, BID, Claudie Re A, MD, 40 Units at 07/18/24 1023    levothyroxine  (SYNTHROID ) tablet 88 mcg, 88 mcg, Oral, QDAY, Khader, Mohammad A, MD, 88 mcg at 07/18/24 9071    lidocaine  (LIDODERM ) 5 % topical patch 2 patch, 2 patch, Topical, QDAY, Khader, Mohammad A, MD, 2 patch at 07/18/24 9071    melatonin tablet 5 mg, 5 mg, Oral, QHS PRN, Claudie Re A, MD, 5 mg at 07/17/24 2048    milk of magnesium  oral suspension 30 mL, 30 mL, Oral, ONCE, Kayastha, Ankur, DO    mirtazapine  (REMERON ) tablet 15 mg, 15 mg, Oral, QDAY, Khader, Mohammad A, MD, 15 mg at 07/17/24 9090    ondansetron  (ZOFRAN  ODT) rapid dissolve tablet 4 mg, 4 mg, Oral, Q6H PRN **OR** ondansetron  HCL (PF) (ZOFRAN  (PF)) injection 4 mg, 4 mg, Intravenous, Q6H PRN, Khader, Mohammad A, MD    oxyCODONE  (ROXICODONE ) tablet 5 mg, 5 mg, Oral, Q6H PRN, Claudie, Mohammad A, MD, 5 mg at 07/18/24 1021    polyethylene glycol 3350  (MIRALAX ) packet 17 g, 1 packet, Oral, BID, Kayastha, Ankur, DO    pramipexole  (MIRAPEX ) tablet 0.75 mg, 0.75 mg, Oral, QDAY, Khader, Mohammad A, MD, 0.75 mg at 07/18/24 9072    predniSONE  (DELTASONE ) tablet 5 mg, 5 mg, Oral, QDAY, Khader, Mohammad A, MD, 5 mg at 07/18/24 9071    pregabalin  (LYRICA ) capsule 75 mg, 75 mg, Oral, Q8H*, Khader, Mohammad A, MD, 75 mg at 07/18/24 0403    [Held by Provider] rosuvastatin  (CRESTOR ) tablet 20 mg, 20 mg, Oral, QDAY, Khader, Mohammad A, MD, 20 mg at 07/17/24 0909    sennosides-docusate sodium  (SENOKOT-S) tablet 1 tablet, 1 tablet, Oral, QDAY PRN, Claudie Re A, MD    venlafaxine  XR (EFFEXOR  XR) capsule 225 mg, 225 mg, Oral, QDAY, Khader, Mohammad A, MD, 225 mg at 07/18/24 9074    Review of Systems:  Hailey Rodgers denies any recent fevers, chills, infection, antibiotics, bowel or bladder incontinence, saddle anesthesia, bleeding issues, or recent anticoagulant.  All 14 systems reviewed and found to be negative except as above and as follows.    Vital Signs:  Last Filed in 24 hours Vital Signs:  24 hour Range    BP: 129/70 (07/27 1200)  Temp: 36.7 ?C (98.1 ?F) (07/27 1200)  Pulse: 63 (07/27 1200)  Respirations: 18 PER MINUTE (07/27 1200)  SpO2: 100 % (07/27 1200)  O2 Device: None (Room air) (07/27 1200)  O2 Liter Flow: 2 Lpm (07/27 0400) BP: (94-129)/(52-70)   Temp:  [36.3 ?C (97.3 ?F)-37 ?C (98.6 ?F)]   Pulse:  [56-79]   Respirations:  [14 PER MINUTE-18 PER MINUTE]   SpO2:  [94 %-100 %]   O2 Device: None (Room air)  O2 Liter Flow: 2 Lpm     Physical Exam:   General: Alert, cooperative, no acute distress.  HEENT: Normocephalic, atraumatic.  Neck:  Supple.  Lungs: Unlabored respirations, bilateral and equal chest excursion.  Heart: Regular rate.  Skin: Warm and dry to touch.  Abdomen: Nondistended.  MSK: Moves all extremities, endorsing diffuse pain in lower extremities and low back, no radicular symptoms endorsed, large back incision noted, well healing   Neurological: Alert and oriented x3.     MRI C-Spine Results:    No results found for this or any previous visit.    No results found for this or any previous visit.    No results found for this or any previous visit.    No results found for this or any previous visit.      MRI L-Spine Results:      Results for orders placed during the hospital encounter of 07/16/24    MRI L-SPINE WO CONTRAST    Narrative  MRI of the thoracic and lumbar spine    HISTORY: Severe back pain, unable to walk, numbness and tingling down the left leg and across the hips    TECHNIQUE: MRI of the thoracic and lumbar spine was performed without IV contrast.    Comparison: MRI thoracic and lumbar spine 06/24/2024, CT abdomen pelvis 06/24/2024    FINDINGS:    Thoracic and lumbar spine hardware-related findings:  Posterior instrumented fusion spanning T12 through the sacroiliac joints is noted with extensive metallic artifacts degrading regional structures, including limiting evaluation of the spinal canal and bone marrow.    Findings of T12 pedicle screws ascending and periprosthetic fracture of T12 are seen to better advantage on comparison CT abdomen and pelvis. There is persistent fluid and edema across the superior T12 fracture plane. Similar scalloped height loss involving the inferior endplate of T11 with edematous marrow signal underlying the endplate defect. No obvious paraspinous phlegmon by noncontrast exam.    Additional T-spine findings:  Superior endplate mild compression deformity of L2 is unchanged. Fibrofatty degenerative endplate changes at T10-T11. Diffuse thoracic disc degeneration, disc bulging, and disc protrusions with mild multilevel thoracic spinal stenosis. Mild degenerative thoracic neural foraminal stenosis from T8 through T12. Thoracic cord is normal in size and signal.    Additional L-spine findings:  Similar minimal grade 1 anterolisthesis at L3-L4 and L4-L5. Otherwise normal lumbar alignment. Grossly unremarkable lumbar marrow signal. Limited evaluation of the lumbar spinal canal due to hardware artifact, without obvious high-grade central spinal stenosis. Disc degeneration, listhesis, and facet hypertrophy result in up to at least mild foraminal stenosis bilaterally at L3-L4.    No obvious psoas muscular edema. Diffuse dorsal paraspinous muscular atrophy, greatest in the lumbar spine. Partially visualized distended urinary bladder. Similar choledochal ectasia.    Impression  1.  Long segment spinal posterior instrumentation spanning T12 through the sacroiliac joints. Known periprosthetic fracture of T12 and pedicle screw loosening are better evaluated on prior CT abdomen and pelvis.  2.  Similar scalloped inferior endplate height loss of T11 with mild marrow edema of the T11 vertebral body and fluid within the T12 fracture plane again likely represent benign edema secondary to compression injuries, junctional mechanical factors, and reactive osteitis. Superinfection is again considered much less likely, particularly given absent secondary paraspinous or epidural inflammatory findings (evaluation somewhat limited by noncontrast imaging).  3.  Residual thoracic and lumbar spondylosis as described, without definite high-grade spinal stenosis. Evaluation of the lumbar spinal canal is limited due to hardware artifact.      Finalized by Alm Schwab, M.D. on 07/17/2024 12:21 AM. Dictated by Alm Schwab, M.D. on 07/17/2024 12:07 AM.  No results found for this or any previous visit.     Results for orders placed during the hospital encounter of 06/23/24    MRI L-SPINE WO/W CONTRAST    Narrative  MRI of the thoracic and lumbar spine    HISTORY: Concern for hardware infection    TECHNIQUE: MRI of the thoracic and lumbar spine was performed prior to and following the uneventful administration of contrast.    Comparison: CT abdomen and pelvis 06/24/2024    FINDINGS:    Posterior instrumented fusion spanning T12 through the sacroiliac joints is noted with extensive metallic artifacts degrading regional structures, including limiting evaluation of the spinal canal and bone marrow.    Findings of T12 pedicle screws ascending and periprosthetic fracture of T12 are seen to better advantage on comparison CT abdomen and pelvis. There is fluid and edema across the superior T12 fracture plane. Scalloped height loss involving the inferior endplate of T11 is noted with edematous and faintly enhancing marrow signal change underlying the endplate defect. No paraspinous phlegmon or obvious extending epidural collection in the lower thoracic canal.    Superior endplate mild compression deformity of L2 is unchanged. Fibrofatty degenerative endplate changes at T10-T11. Diffuse thoracic disc degeneration, disc bulging, and disc protrusions with mild diffuse thoracic spinal stenosis. Mild degenerative thoracic foraminal stenosis from T8 through T12. No obvious aggressive destructive process involving the lumbar spine. Psoas muscular intensity and size are within expected limits. No lumbar prevertebral space abscess or paraspinous phlegmon.    Impression  1.  Long segment spinal posterior instrumentation spanning T12 through the sacroiliac joints. Periprosthetic fracture of T12 with pedicle screw loosening more completely assessed on same-day CT abdomen and pelvis.  2.  Scalloped inferior endplate height loss of T11 with mild enhancing marrow edema of the T11 vertebral body and fluid within the T12 fracture plane likely represents benign edema secondary to compression injuries, junctional mechanical factors, and reactive osteitis. Superinfection is less likely, particularly given absent secondary paraspinous or epidural inflammatory findings.      Finalized by Mabel Dunks, M.D. on 06/24/2024 5:45 AM. Dictated by Mabel Dunks, M.D. on 06/24/2024 5:28 AM.      MRI T-Spine Results:    No results found for this or any previous visit.    No results found for this or any previous visit.    Results for orders placed during the hospital encounter of 06/23/24    MRI T-SPINE WO/W CONTRAST    Narrative  MRI of the thoracic and lumbar spine    HISTORY: Concern for hardware infection    TECHNIQUE: MRI of the thoracic and lumbar spine was performed prior to and following the uneventful administration of contrast.    Comparison: CT abdomen and pelvis 06/24/2024    FINDINGS:    Posterior instrumented fusion spanning T12 through the sacroiliac joints is noted with extensive metallic artifacts degrading regional structures, including limiting evaluation of the spinal canal and bone marrow.    Findings of T12 pedicle screws ascending and periprosthetic fracture of T12 are seen to better advantage on comparison CT abdomen and pelvis. There is fluid and edema across the superior T12 fracture plane. Scalloped height loss involving the inferior endplate of T11 is noted with edematous and faintly enhancing marrow signal change underlying the endplate defect. No paraspinous phlegmon or obvious extending epidural collection in the lower thoracic canal.    Superior endplate mild compression deformity of L2 is unchanged. Fibrofatty degenerative endplate changes at T10-T11. Diffuse thoracic disc degeneration,  disc bulging, and disc protrusions with mild diffuse thoracic spinal stenosis. Mild degenerative thoracic foraminal stenosis from T8 through T12. No obvious aggressive destructive process involving the lumbar spine. Psoas muscular intensity and size are within expected limits. No lumbar prevertebral space abscess or paraspinous phlegmon.    Impression  1.  Long segment spinal posterior instrumentation spanning T12 through the sacroiliac joints. Periprosthetic fracture of T12 with pedicle screw loosening more completely assessed on same-day CT abdomen and pelvis.  2.  Scalloped inferior endplate height loss of T11 with mild enhancing marrow edema of the T11 vertebral body and fluid within the T12 fracture plane likely represents benign edema secondary to compression injuries, junctional mechanical factors, and reactive osteitis. Superinfection is less likely, particularly given absent secondary paraspinous or epidural inflammatory findings.      Finalized by Mabel Dunks, M.D. on 06/24/2024 5:45 AM. Dictated by Mabel Dunks, M.D. on 06/24/2024 5:28 AM.    Results for orders placed during the hospital encounter of 07/16/24    MRI T-SPINE WO CONTRAST    Narrative  MRI of the thoracic and lumbar spine    HISTORY: Severe back pain, unable to walk, numbness and tingling down the left leg and across the hips    TECHNIQUE: MRI of the thoracic and lumbar spine was performed without IV contrast.    Comparison: MRI thoracic and lumbar spine 06/24/2024, CT abdomen pelvis 06/24/2024    FINDINGS:    Thoracic and lumbar spine hardware-related findings:  Posterior instrumented fusion spanning T12 through the sacroiliac joints is noted with extensive metallic artifacts degrading regional structures, including limiting evaluation of the spinal canal and bone marrow.    Findings of T12 pedicle screws ascending and periprosthetic fracture of T12 are seen to better advantage on comparison CT abdomen and pelvis. There is persistent fluid and edema across the superior T12 fracture plane. Similar scalloped height loss involving the inferior endplate of T11 with edematous marrow signal underlying the endplate defect. No obvious paraspinous phlegmon by noncontrast exam.    Additional T-spine findings:  Superior endplate mild compression deformity of L2 is unchanged. Fibrofatty degenerative endplate changes at T10-T11. Diffuse thoracic disc degeneration, disc bulging, and disc protrusions with mild multilevel thoracic spinal stenosis. Mild degenerative thoracic neural foraminal stenosis from T8 through T12. Thoracic cord is normal in size and signal.    Additional L-spine findings:  Similar minimal grade 1 anterolisthesis at L3-L4 and L4-L5. Otherwise normal lumbar alignment. Grossly unremarkable lumbar marrow signal. Limited evaluation of the lumbar spinal canal due to hardware artifact, without obvious high-grade central spinal stenosis. Disc degeneration, listhesis, and facet hypertrophy result in up to at least mild foraminal stenosis bilaterally at L3-L4.    No obvious psoas muscular edema. Diffuse dorsal paraspinous muscular atrophy, greatest in the lumbar spine. Partially visualized distended urinary bladder. Similar choledochal ectasia.    Impression  1.  Long segment spinal posterior instrumentation spanning T12 through the sacroiliac joints. Known periprosthetic fracture of T12 and pedicle screw loosening are better evaluated on prior CT abdomen and pelvis.  2.  Similar scalloped inferior endplate height loss of T11 with mild marrow edema of the T11 vertebral body and fluid within the T12 fracture plane again likely represent benign edema secondary to compression injuries, junctional mechanical factors, and reactive osteitis. Superinfection is again considered much less likely, particularly given absent secondary paraspinous or epidural inflammatory findings (evaluation somewhat limited by noncontrast imaging).  3.  Residual thoracic and lumbar spondylosis as described, without definite high-grade spinal  stenosis. Evaluation of the lumbar spinal canal is limited due to hardware artifact.      Finalized by Alm Schwab, M.D. on 07/17/2024 12:21 AM. Dictated by Alm Schwab, M.D. on 07/17/2024 12:07 AM.      Lab/Radiology/Other Diagnostic Tests:  24-hour labs:    Results for orders placed or performed during the hospital encounter of 07/16/24 (from the past 24 hours)   SED RATE    Collection Time: 07/17/24 12:16 PM   Result Value Ref Range    Sed Rate -ESR 26 <=30 mm/h   C REACTIVE PROTEIN (CRP)    Collection Time: 07/17/24 12:16 PM   Result Value Ref Range    C-Reactive Protein 0.23 <1.00 mg/dL   CBC AND DIFF    Collection Time: 07/17/24 12:16 PM   Result Value Ref Range    White Blood Cells 6.70 4.50 - 11.00 10*3/uL    Red Blood Cells 4.37 4.00 - 5.00 10*6/uL    Hemoglobin 12.0 12.0 - 15.0 g/dL    Hematocrit 63.8 63.9 - 45.0 %    MCV 82.6 80.0 - 100.0 fL    MCH 27.5 26.0 - 34.0 pg    MCHC 33.3 32.0 - 36.0 g/dL    RDW 81.3 (H) 88.9 - 15.0 %    Platelet Count 221 150 - 400 10*3/uL    MPV 8.1 7.0 - 11.0 fL    Neutrophils 72.1 41.0 - 77.0 %    Lymphocytes 15.4 (L) 24.0 - 44.0 %    Monocytes 7.7 4.0 - 12.0 %    Eosinophils 4.1 0.0 - 5.0 %    Basophils 0.7 0.0 - 2.0 %    Absolute Neutrophil Count 4.80 1.80 - 7.00 10*3/uL    Absolute Lymph Count 1.00 1.00 - 4.80 10*3/uL    Absolute Monocyte Count 0.50 0.00 - 0.80 10*3/uL    Absolute Eosinophil Count 0.30 0.00 - 0.45 10*3/uL    Absolute Basophil Count 0.00 0.00 - 0.20 10*3/uL   COMPREHENSIVE METABOLIC PANEL    Collection Time: 07/17/24 12:16 PM   Result Value Ref Range    Sodium 136 (L) 137 - 147 mmol/L    Potassium 4.5 3.5 - 5.1 mmol/L    Chloride 102 98 - 110 mmol/L    Glucose 187 (H) 70 - 100 mg/dL    Blood Urea Nitrogen 26 (H) 7 - 25 mg/dL    Creatinine 9.23 9.59 - 1.00 mg/dL    Calcium  9.2 8.5 - 10.6 mg/dL    Total Protein 6.4 6.0 - 8.0 g/dL    Total Bilirubin 0.4 0.2 - 1.3 mg/dL    Albumin 3.6 3.5 - 5.0 g/dL    Alk Phosphatase 886 (H) 25 - 110 U/L    AST 46 (H) 7 - 40 U/L    ALT 62 (H) 7 - 56 U/L    CO2 27 21 - 30 mmol/L    Anion Gap 7 3 - 12    Glomerular Filtration Rate (GFR) >60 >60 mL/min   POC GLUCOSE    Collection Time: 07/17/24  2:09 PM   Result Value Ref Range    Glucose, POC 165 (H) 70 - 100 mg/dL   POC GLUCOSE    Collection Time: 07/17/24  3:59 PM   Result Value Ref Range    Glucose, POC 203 (H) 70 - 100 mg/dL   POC GLUCOSE    Collection Time: 07/17/24  6:20 PM   Result Value Ref Range    Glucose, POC 173 (H) 70 -  100 mg/dL   POC GLUCOSE    Collection Time: 07/17/24  8:38 PM   Result Value Ref Range    Glucose, POC 183 (H) 70 - 100 mg/dL   MAGNESIUM     Collection Time: 07/18/24  3:33 AM   Result Value Ref Range    Magnesium  2.0 1.6 - 2.6 mg/dL   CBC AND DIFF    Collection Time: 07/18/24  3:33 AM   Result Value Ref Range    White Blood Cells 6.40 4.50 - 11.00 10*3/uL    Red Blood Cells 3.95 (L) 4.00 - 5.00 10*6/uL    Hemoglobin 10.9 (L) 12.0 - 15.0 g/dL    Hematocrit 67.3 (L) 36.0 - 45.0 %    MCV 82.6 80.0 - 100.0 fL    MCH 27.6 26.0 - 34.0 pg    MCHC 33.4 32.0 - 36.0 g/dL    RDW 80.9 (H) 88.9 - 15.0 %    Platelet Count 191 150 - 400 10*3/uL    MPV 8.0 7.0 - 11.0 fL    Neutrophils 52.6 41.0 - 77.0 %    Lymphocytes 31.1 24.0 - 44.0 %    Monocytes 10.4 4.0 - 12.0 %    Eosinophils 5.1 (H) 0.0 - 5.0 %    Basophils 0.8 0.0 - 2.0 %    Absolute Neutrophil Count 3.40 1.80 - 7.00 10*3/uL    Absolute Lymph Count 2.00 1.00 - 4.80 10*3/uL    Absolute Monocyte Count 0.70 0.00 - 0.80 10*3/uL    Absolute Eosinophil Count 0.30 0.00 - 0.45 10*3/uL    Absolute Basophil Count 0.00 0.00 - 0.20 10*3/uL   COMPREHENSIVE METABOLIC PANEL    Collection Time: 07/18/24  3:33 AM   Result Value Ref Range    Sodium 137 137 - 147 mmol/L    Potassium 4.0 3.5 - 5.1 mmol/L    Chloride 103 98 - 110 mmol/L    Glucose 177 (H) 70 - 100 mg/dL    Blood Urea Nitrogen 23 7 - 25 mg/dL    Creatinine 9.43 9.59 - 1.00 mg/dL    Calcium  8.9 8.5 - 10.6 mg/dL    Total Protein 5.9 (L) 6.0 - 8.0 g/dL    Total Bilirubin 0.3 0.2 - 1.3 mg/dL    Albumin 3.5 3.5 - 5.0 g/dL    Alk Phosphatase 887 (H) 25 - 110 U/L    AST 32 7 - 40 U/L    ALT 50 7 - 56 U/L    CO2 26 21 - 30 mmol/L    Anion Gap 8 3 - 12    Glomerular Filtration Rate (GFR) >60 >60 mL/min   POC GLUCOSE    Collection Time: 07/18/24  9:24 AM   Result Value Ref Range    Glucose, POC 91 70 - 100 mg/dL   POC GLUCOSE    Collection Time: 07/18/24 12:04 PM   Result Value Ref Range    Glucose, POC 137 (H) 70 - 100 mg/dL

## 2024-07-18 NOTE — Progress Notes
 PHYSICAL THERAPY  ASSESSMENT      Name: Hailey Rodgers   MRN: [id number]     DOB: 11/23/1951      Age: 73 y.o.  Admission Date: 07/16/2024     LOS: 0 days     Date of Service: 07/18/2024      Mobility  Patient Turn/Position: Chair  Mobility Level [facility] Highest Level of Mobility (JH-HLM): Walk 10 steps or more (to the bathroom)  Distance Walked (feet): 10 ft  Level of Assistance: Assist X1  Assistive Device: Walker  Activity Limited By: Weakness;Fatigue;Pain    Subjective  Reason for Admission and Past Medical Hx: 73 y.o. female with history of rheumatoid arthritis on chronic predniosone,  OSA on BIPAP, chronic heart failure with preserved EF,  CAD , HTN, dyslipidemia, IDDM type 2, history of urinary retentions ,  history of b  T12-iliac decompression and fusion ~ 01/2024  presented to  ED on 06/23/24 for fever along with acute on chronic back pain, diarrhea, and foul urine/incontinence.  Patient was recently discharged from inpatient rehab where she was recovering from spinal surgery.  She fell twice after discharge and had urinary incontinence since the fall.  MRI T-spine and L-spine showed known periprosthetic fracture of T12 and pedicle screw loosening.  Also showed scalloped inferior endplate height loss of T11 and mild marrow edema of the T11 vertebral body.  T12 fracture plan likely represented benign edema secondary to compression injuries, junctional mechanical factors, and/or reactive osteitis.  Superinfection was ruled out as a possibility.  Neurosurgery was consulted in the ER, recommended no acute surgical intervention.  Special Considerations: Current oxygen requirement  Comments: Requires O2 at night  Mental / Cognitive: Alert;Cooperative;Follows commands  Pain: Complains of pain;Does not rate pain  Pain level: During activity  Pain Location: Back  Pain Interventions: Patient agrees to participate in therapy with current pain level;Patient assisted into position of comfort;Nursing staff notified of patient's pain level    Home Living Situation  Lives With: Spouse/significant other  Comments: Per patient, spouse not in good health after having a brain bleed, about 3 years ago.  Type of Home: Mobile home  Entry Stairs: Ramp  In-Home Stairs: No stairs  Bathroom Setup: Not accessible  Patient Owned Equipment: Cane: Single point;Walker: Rollator;Hospital bed;Commode: Standard    Prior Level of Function  Level Of Independence: Independent with ADL and community mobility with endurance limitations;Independent with ADL and household mobility with device  Comments: Patient reports she uses a RW in the community but is unable to use RW in house due to size. Uses SPC in house instead  History of Falls in Past 3 Months: Yes  Comments: 4 falls - reports he legs are weak  Comments: Recently discharged from rehab    ROM  R LE ROM: WFL  R LE ROM Method: Active  L LE ROM: WFL  L LE ROM Method: Active  ROM Comments: Per functional assessment    Strength  Overall Strength: Generalized weakness  Strength Comments: Per functional assessment    Sensation/Tone/Coordination  Head Control: Independent  Posture: Rounded shoulders    Bed Mobility/Transfer  Bed Mobility: Supine to Sit: Standby Assist;Head of Bed Elevated  Comments: Patient up in chair at end of session    Transfer Type: Sit to/from Stand  Transfer: Assistance Level: From;Bed;To;Bed Side Chair;Minimal Assist  Transfer: Assistive Device: Nurse, adult  Transfers: Type Of Assistance: Verbal Cues;Elevated Bed;For Balance;For Strength Deficit;For Safety Considerations    Other Transfer Type: Sit  to/from Stand  Other Transfer: Assistance Level: To/From;Bed Side Chair;Minimal Assist (x5 reps)  Other Transfer: Assistive Device: Nurse, adult  Other Transfer: Type Of Assistance: Verbal Cues;For Balance;For Strength Deficit;For Safety Considerations    End Of Activity Status: Up in Chair;Nursing Notified;Instructed Patient to Request Assist with Mobility;Instructed Patient to Use Call Light (chair alarm on)    Comments: Patient has poor eccentric control when returning to sit    Balance  Sitting Balance: Static Sitting Balance;Dynamic Sitting Balance;2 UE Support;Standby Assist  Standing Balance: Static Standing Balance;Dynamic Standing Balance;2 UE support;Minimal Assist  5x Sit to Stand Result (seconds): 0  5X Sit to Stand Comment:: Patient unable to complete test without use of BUE or physical assistance. Able to complete test in 38.58 seconds with use of BUE and minA    Gait  Gait Distance: 10 feet  Gait: Assistance Level: Minimal Assist  Gait: Assistive Device: Roller Walker  Gait: Descriptors: Decreased foot clearance RLE;Decreased foot clearance LLE;Pace: Slow;Swing-Through Gait;No balance loss;Decreased step length    Comments: Patient fatigues quickly    Activity Limited By: Complaint of Fatigue;Complaint of Pain;Weakness    Education  Persons Educated: Patient  Patient Barriers To Learning: None Noted  Teaching Methods: Verbal Instruction  Patient Response: Verbalized Understanding  Topics: Plan/Goals of PT Interventions;Use of Assistive Device/Orthosis;Mobility Progression;Up with Assist Only;Importance of Increasing Activity;Recommend Continued Therapy;Therapy Schedule    Assessment/Progress  Impaired Mobility Due To: Decreased Strength;Pain;Impaired Balance;Decreased Activity Tolerance;Deconditioning;Medical Status Limitation  Impaired Strength Due To: Pain;Decreased Activity Tolerance;Deconditioning  Assessment/Progress: Should Improve w/ Continued PT    Comments: Patient presents generalized weakness, balance impairments, and endurance deficits. Requires Ax1 for all functional mobility. Would benefit from continued PT interventions to improve functional mobility.    AM-PAC 6 Clicks Basic Mobility Inpatient  Turning from your back to your side while in a flat bed without using bed rails: A Little  Moving from lying on your back to sitting on the side of a flat bed without using bedrails : A Little  Moving to and from a bed to a chair (including a wheelchair): A Little  Standing up from a chair using your arms (e.g. wheelchair, or bedside chair): A Little  To walk in hospital room: A Little  Climbing 3-5 steps with a railing: A Lot  Basic Mobility Inpatient Raw Score: 17  Standardized (T-scale) Score: 39.67  Mobility Goal [facility]: JH-HLM 5 Stand >= 1 min    Goals  Goal Formulation: With Patient  Time For Goal Achievement: 3 days, To, 5 days  Patient Will Go Supine To/From Sit: Independently  Patient Will Transfer Bed/Chair: Independently  Patient Will Transfer Sit to Stand: Independently  Patient Will Ambulate: 151-200 Feet, w/ Vannie, w/ Stand By Assist    Plan  Treatment Interventions: Mobility training;Strengthening;Balance activities;Endurance training  Plan Frequency: 1-2 Days per Week  PT Plan for Next Visit: Progress gait distance, repeated sit to stands, and general strengthening    PT Discharge Recommendations  Recommendation: Inpatient setting  Patient Currently Requires Physical Assist With: All mobility;All personal care ADLs;All home functioning ADLs      Therapist  Damien Jacobsen, PT  Date  07/18/2024

## 2024-07-19 LAB — CBC AND DIFF
~~LOC~~ BKR ABSOLUTE BASO COUNT: 0 10*3/uL — ABNORMAL HIGH (ref 0.00–0.20)
~~LOC~~ BKR ABSOLUTE LYMPH COUNT: 1.4 10*3/uL (ref 1.00–4.80)
~~LOC~~ BKR ABSOLUTE MONO COUNT: 0.6 10*3/uL (ref 0.00–0.80)
~~LOC~~ BKR BASOPHILS %: 0.4 % (ref 0.0–2.0)
~~LOC~~ BKR MPV: 8.3 fL — ABNORMAL HIGH (ref 7.0–11.0)
~~LOC~~ BKR RDW: 19 % — ABNORMAL HIGH (ref 11.0–15.0)

## 2024-07-19 LAB — COMPREHENSIVE METABOLIC PANEL
~~LOC~~ BKR ALT: 49 U/L — ABNORMAL HIGH (ref 7–56)
~~LOC~~ BKR ANION GAP: 8 10*3/uL — ABNORMAL HIGH (ref 3–12)
~~LOC~~ BKR CREATININE: 0.4 mg/dL — ABNORMAL HIGH (ref 0.40–1.00)

## 2024-07-19 LAB — MAGNESIUM: ~~LOC~~ BKR MAGNESIUM: 2 mg/dL — ABNORMAL LOW (ref 1.6–2.6)

## 2024-07-19 LAB — POC GLUCOSE
~~LOC~~ BKR POC GLUCOSE: 148 mg/dL — ABNORMAL HIGH (ref 70–100)
~~LOC~~ BKR POC GLUCOSE: 194 mg/dL — ABNORMAL HIGH (ref 70–100)
~~LOC~~ BKR POC GLUCOSE: 226 mg/dL — ABNORMAL HIGH (ref 70–100)
~~LOC~~ BKR POC GLUCOSE: 86 mg/dL (ref 70–100)

## 2024-07-19 MED ORDER — MIRTAZAPINE 15 MG PO TAB
15 mg | Freq: Every evening | ORAL | 0 refills | Status: DC
Start: 2024-07-19 — End: 2024-07-23
  Administered 2024-07-20 – 2024-07-23 (×4): 15 mg via ORAL

## 2024-07-19 MED ORDER — ARTIFICIAL TEARS MULTI DOSE DROPS GROUP
1 [drp] | OPHTHALMIC | 0 refills | Status: DC | PRN
Start: 2024-07-19 — End: 2024-07-23

## 2024-07-19 MED ORDER — ROSUVASTATIN 10 MG PO TAB
20 mg | Freq: Every evening | ORAL | 0 refills | Status: DC
Start: 2024-07-19 — End: 2024-07-23
  Administered 2024-07-20 – 2024-07-23 (×4): 20 mg via ORAL

## 2024-07-19 NOTE — Consults
 This note used to fulfill interventional pain consult order.  See note from 7/27 for full details        Selinda Calender, APRN-NP  Interventional Pain Consult Pager [phone]  Department of Anesthesiology   [facility]

## 2024-07-19 NOTE — Case Management (ED)
 1611: Notified by Ilah (SW) that the patient is anticipated to be ready for discharge today and would prefer to stay at [facility] IP rehab unit. [facility] admission office will await for completion of rehab consult to determine most appropriate level of discharge needs.    Ronal Jenkins Canard, BSN, RN  [facility] Admission Nurse (office: [phone]) or (voalte).

## 2024-07-19 NOTE — Progress Notes
 I have performed, reperformed, and/or observed the notes, assessments and procedures performed by Sabra, RN.  I was personally present on this date of service 7/28 and concur with the nurse's documentation unless otherwise noted.

## 2024-07-19 NOTE — Progress Notes
 Night 1900-0700    Pain: Pain is controlled with current analgesics. Medications being used: Acetaminophen  and oxycodone  PO    Nutrition: Diet- Diet: Diabetic    Activity: Ambulation Episodes: turns self    Incentive Spirometer this shift: No  Max Volume: none    Acute Events, Nursing Intervention or Provider Communication: none    Patient Interventions and Education   Fall Risk/JHFRAT Interventions and Education: (Charting when applicable)   Elimination Interventions: N/A   Medications: N/A   Patient Care Equipment: N/A   Mobility: Assist x1 and Utilize walker, cane, or additional walking aid for ambulation   Cognition: N/A   Risk for Moderate/Major Injury: Age: >65 yrs, Risk for fracture, and Active Anticoagulation     Restraints: No  Restraints Goal: choice: N/A  See Docflowsheet for restraint documentation, interventions, education, etc.    Intake and Output:      Date 07/18/24 0701 - 07/19/24 0700 07/19/24 0701 - 07/20/24 0700   Shift 0701-1900 1901-0700 24 Hour Total 0701-1900 1901-0700 24 Hour Total   INTAKE   P.O. 1240 240 1480      Shift Total(mL/kg) 1240(15.1) 240(2.9) 1480(18)      OUTPUT   Urine(mL/kg/hr) 900(0.9) 1800 2700        Urine Output (mL) (Indwelling Urinary Catheter 16 FR Standard 2-way) 900 1800 2700      Shift Total(mL/kg) 900(11) 1800(22) 2700(32.9)      NET 340 -1560 -1220      Weight (kg) 82 82 82 82 82 82         This RN provided education to patient. The following education topics were reviewed with patient: Catheter Patient Verbalizes Understanding     Patient Belongings observed in room during this RN's shift: Naval architect    Discharge Plan: Discharge Planning: Patient has a ride

## 2024-07-19 NOTE — Progress Notes
 OCCUPATIONAL THERAPY  ASSESSMENT NOTE      Name: Hailey Rodgers   MRN: [id number]     DOB: Oct 20, 1951      Age: 73 y.o.  Admission Date: 07/16/2024     LOS: 1 day     Date of Service: 07/19/2024      Mobility  Patient Turn/Position: Chair  Mobility Level [facility] Highest Level of Mobility (JH-HLM): Walk 25 feet or more (to doorway/hallway)  Distance Walked (feet): 120 ft  Level of Assistance: Assist X1  Assistive Device: Walker  Activity Limited By: Ulice    Subjective  Reason for Admission and Past Medical Hx: 73 y.o. female with history of rheumatoid arthritis on chronic predniosone, OSA on BIPAP, chronic heart failure with preserved EF, CAD , HTN, dyslipidemia, IDDM type 2, history of urinary retentions , history of b T12-iliac decompression and fusion ~ 01/2024 presented to [facility] ED on 06/23/24 for fever along with acute on chronic back pain, diarrhea, and foul urine/incontinence. Patient was recently discharged from inpatient rehab where she was recovering from spinal surgery. She fell twice after discharge and had urinary incontinence since the fall. MRI T-spine and L-spine showed known periprosthetic fracture of T12 and pedicle screw loosening. Also showed scalloped inferior endplate height loss of T11 and mild marrow edema of the T11 vertebral body. T12 fracture plan likely represented benign edema secondary to compression injuries, junctional mechanical factors, and/or reactive osteitis. Superinfection was ruled out as a possibility. Neurosurgery was consulted in the ER, recommended no acute surgical intervention.  Comments: Requires O2 at night  Mental / Cognitive: Alert;Cooperative;Follows commands  Pain: Complains of pain;Does not rate pain  Pain level: During activity  Pain Location: Back  Pain Interventions: Patient agrees to participate in therapy with current pain level;Patient assisted into position of comfort;Nursing staff notified of patient's pain level  Comments: patient in bed at arrival & remains in chair at exit with alarm activated    Home Living Situation  Lives With: Spouse/significant other  Comments: Per patient, spouse not in good health after having a brain bleed, about 3 years ago.  Type of Home: Mobile home  Entry Stairs: Ramp  In-Home Stairs: No stairs  Bathroom Setup: Not accessible  Patient Owned Equipment: Cane: Single point;Walker: Rollator;Hospital bed;Commode: Standard  Comments: Patient says she is unable to use walker inside her trailer due to clutter so she uses a cane.    Prior Level of Function  Level Of Independence: Independent with ADL and community mobility with endurance limitations;Independent with ADL and household mobility with device  Comments: Patient reports she uses a RW in the community but is unable to use RW in house due to size. Uses SPC in house instead  History of Falls in Past 3 Months: Yes  Comments: 4 falls - reports he legs are weak  Comments: Recently discharged from rehab    Vision  Corrective Lenses: No hx of corrective lenses    ADL's  Where Assessed: Edge of Bed  LE Dressing Assist: Minimal Assist  LE Dressing Deficits: Don/Doff R Sock;Don/Doff L Sock  Toileting Assist: Total Assist  Comment: minA to don socks while seated EOB utilizing figure four method, retropulsive and requires increased time.    ADL Mobility  Bed Mobility: Supine to Sit: Standby assist  Transfer Type: Sit to/from stand  Transfer: Assistance Level: To/from;Bed;Bedside chair;Minimal assist  Transfer: Assistive Device: Agricultural consultant: Type of Assistance: For safety considerations;For strength deficit  End of Activity Status: Up  in chair;Instructed patient to request assist with mobility;Instructed patient to use call light;Nursing notified  Sitting Balance: Static sitting balance;Dynamic sitting balance;Standby assist  Standing Balance: Static standing balance;Dynamic standing balance;Minimal assist  Gait Distance: 120 feet  Gait: Assistance Level: Minimal assist  Gait: Assistive Device: Roller walker  Gait Comments: Fatigues quickly. Reports her legs can just give out.    Activity Tolerance  Endurance: 3/5 Tolerates 25-30 Minutes Exercise w/Multiple Rests    Cognition  Overall Cognitive Status: WFL to Adequately Complete Self Care Tasks Safely  Comprehension: WFL to Adequately Complete Self Care Tasks Safely  Expression: WFL Adequate to Meet Daily Needs  Social Interaction: WFL Adequate to Solve Routine Tasks  Problem Solving: WFL Adequate to Solve Routine Problems  Memory: WFL Adequate to Recall Day to Day Activities    ROM  R UE ROM: WFL   R UE ROM Method: Active  L UE ROM: WFL   L UE ROM Method: Active    Edema  RUE Edema: No Significant Edema  LUE Edema: No Significant Edema    Sensory  Overall Sensory: Pt Perceives Pressure in Both UEs in Gross Exam    Strength / Tone  Strength Position Assessed: Seated  R UE Strength: WFL  L UE Strength: WFL    Education  Persons Educated: Patient  Barriers To Learning: None Noted  Teaching Methods: Verbal Instruction;Demonstration  Patient Response: Verbalized and Demo Understanding  Topics: Role of OT, Goals for Therapy;Home safety;ADL Compensatory Techniques  Goal Formulation: With Patient    Assessment  Assessment: Decreased ADL Status;Decreased Endurance;Decreased High-Level ADLs  Prognosis: Good;w/Cont OT s/p Acute Discharge  Goal Formulation: Patient  Comments: Patient agreeable & pleasant during OT session this date. Patient requires increased physical assistance than their reported prior level of functioning. Patient demonstrates decreased endurance, balance, activity tolerance, and bilateral LE weakness throughout entirety of OT session. Patient would continue to benefit from skilled OT to maximize their safety & independence in ADLs and functional mobility prior to discharge.     AM-PAC 6 Clicks Daily Activity Inpatient  Putting on and taking off regular lower body clothes: A Little  Bathing (Including washing, rinsing, drying): A Little  Toileting, which includes using toilet, bedpan, or urinal: Total  Putting on and taking off regular upper body clothing: None  Taking care of personal grooming such as brushing teeth: None  Eating meals: None  Daily Activity Raw Score: 19  Standardized (T-scale) Score: 40.22    Plan  Progress: Progressing Toward Goals  OT Frequency: 3-5x/week  OT Plan for Next Visit: progress endurance w walker; sit <> stands; standing ADLs at sink    ADL Goals  Patient Will Perform All ADL's: w/ Modified Independence    Functional Transfer Goals  Pt Will Perform All Functional Transfers: Modified Independent    OT Discharge Recommendations  Recommendation: Inpatient setting    Therapist: Vernell Ennis Jodi FREDERICK, OTR/L [id number]    Date: 07/19/2024

## 2024-07-19 NOTE — Progress Notes
 Pharmacy High Risk Medication Review    A high risk medication review has been performed for Hailey Rodgers by a pharmacy team member.      Medication class(es) to review on inpatient med list:  Opioids and Muscle relaxant      The medications class(es) are noted to be high risk to mobility or mentation in patients >74 years of age. If your patient has been CAM positive (diagnosed with delirium) at any point during this hospitalization then it would be particularly helpful to review these medications. Each medication has its own unique risks and benefits to the patient, we ask that these medications are reviewed and considered for de-prescribing where appropriate.     Consider reviewing acbcalc.com for anticholinergic burden calculation.  If you need further assistance identifying or de-prescribing, please contact pharmacy.      Current Inpatient Medications:  Scheduled Meds:aspirin  chewable tablet 81 mg, 81 mg, Oral, QDAY  celecoxib  (CeleBREX ) capsule 200 mg, 200 mg, Oral, QDAY  diclofenac  sodium (VOLTAREN ) 1 % topical gel 4 g, 4 g, Topical, QID  enoxaparin  (LOVENOX ) syringe 40 mg, 40 mg, Subcutaneous, QDAY(21)  famotidine  (PEPCID ) tablet 40 mg, 40 mg, Oral, QDAY  folic acid  (FOLVITE ) tablet 4 mg, 4 mg, Oral, QDAY  insulin  aspart (U-100) (NOVOLOG  FLEXPEN U-100 INSULIN ) injection PEN 0-6 Units, 0-6 Units, Subcutaneous, ACHS (22)  insulin  aspart (U-100) (NOVOLOG  FLEXPEN U-100 INSULIN ) injection PEN 15 Units, 15 Units, Subcutaneous, TID before meals  insulin  glargine (LANTUS  SOLOSTAR U-100 INSULIN ) injection PEN 40 Units, 40 Units, Subcutaneous, BID  levothyroxine  (SYNTHROID ) tablet 88 mcg, 88 mcg, Oral, QDAY  lidocaine  (LIDODERM ) 5 % topical patch 2 patch, 2 patch, Topical, QDAY  mirtazapine  (REMERON ) tablet 15 mg, 15 mg, Oral, QDAY  polyethylene glycol 3350  (MIRALAX ) packet 17 g, 1 packet, Oral, BID  pramipexole  (MIRAPEX ) tablet 0.75 mg, 0.75 mg, Oral, QDAY  predniSONE  (DELTASONE ) tablet 5 mg, 5 mg, Oral, QDAY  pregabalin  (LYRICA ) capsule 75 mg, 75 mg, Oral, Q8H*  rosuvastatin  (CRESTOR ) tablet 20 mg, 20 mg, Oral, QHS  venlafaxine  XR (EFFEXOR  XR) capsule 225 mg, 225 mg, Oral, QDAY    Continuous Infusions:  PRN and Respiratory Meds:acetaminophen  Q4H PRN, albuterol  sulfate Q6H PRN, baclofen  TID PRN, bisacodyL  QDAY PRN, dextrose  50% (D50) IV PRN, melatonin QHS PRN, ondansetron  Q6H PRN **OR** ondansetron  (ZOFRAN ) IV Q6H PRN, oxyCODONE  Q6H PRN, sennosides-docusate sodium  QDAY PRN        Thank you,  Hildegard Boatman, [facility]  07/19/2024

## 2024-07-19 NOTE — Progress Notes
 Night 1900-0700    Pain: The patient is not having any pain    Nutrition: Diet- Diet: Diabetic    Activity: Ambulation Episodes: turns self    Incentive Spirometer this shift: No  Max Volume: none    Acute Events, Nursing Intervention or Provider Communication: none    Patient Interventions and Education   Fall Risk/JHFRAT Interventions and Education: (Charting when applicable)   Elimination Interventions: Use of bladder management device (e.g., female/female external urinary containment device)    Medications: Use of gait belt  and Stay within arm's reach during toileting/showering (i.e., dizziness, orthostasis)    Patient Care Equipment: Needs assistance with patient care equipment when ambulating   Mobility: Assist x1 and Utilize walker, cane, or additional walking aid for ambulation   Cognition: Bed/Chair Alarm    Risk for Moderate/Major Injury: Age: >65 yrs, Risk for fracture, and Active Anticoagulation     Restraints: No  Restraints Goal: choice: N/A  See Docflowsheet for restraint documentation, interventions, education, etc.    Intake and Output:      Date 07/19/24 0701 - 07/20/24 0700 07/20/24 0701 - 07/21/24 0700   Shift 0701-1900 1901-0700 24 Hour Total 0701-1900 1901-0700 24 Hour Total   INTAKE   P.O. 1440 1260 2700      Shift Total(mL/kg) 1440(17.6) 1260(15.4) 2700(32.9)      OUTPUT   Urine(mL/kg/hr) 1010(1) 1200 2210        Urine  1200 1200        Urine Output (mL) ([REMOVED] Indwelling Urinary Catheter 16 FR Standard 2-way) 1010  1010      Other           Stool Occurrence 4 x  4 x      Shift Total(mL/kg) 1010(12.3) 1200(14.6) 7789(72)      NET 430 60 490      Weight (kg) 82 82 82 82 82 82         This RN provided education to patient. The following education topics were reviewed with patient: Medication schedule medication  Patient Verbalizes Understanding and Demonstrated Understanding     Patient Belongings observed in room during this RN's shift: Cell Phone    Discharge Plan: Discharge Planning: Patient does not have a ride

## 2024-07-19 NOTE — Progress Notes
 Internal Medicine Daily Progress Note      Patient's Name:  Hailey Rodgers MRN: [id number]   Today's Date:  07/19/2024  Admission Date: 07/16/2024  LOS: 1 day    Problem list  Principal Problem:    Recurrent falls  Active Problems:    Generalized anxiety disorder    Frequent falls    Rheumatoid arthritis (CMS-HCC)    Cervical radiculopathy at C8    Hypothyroidism (acquired)    Coronary artery calcification    Chronic heart failure with preserved ejection fraction (CMS-HCC)    Back pain with sciatica    Brief Hospital Course     Hailey Rodgers is a 73 y.o. female with history of rheumatoid arthritis on chronic predniosone,  OSA on BIPAP, chronic heart failure with preserved EF,  CAD , HTN, dyslipidemia, IDDM type 2, history of urinary retentions ,  history of b  T12-iliac decompression and fusion ~ 01/2024  presented to [facility] ED on 06/23/24 for fever along with acute on chronic back pain, diarrhea, and foul urine/incontinence.  Patient was recently discharged from inpatient rehab where she was recovering from spinal surgery.  She fell twice after discharge and had urinary incontinence since the fall.  MRI T-spine and L-spine showed known periprosthetic fracture of T12 and pedicle screw loosening.  Also showed scalloped inferior endplate height loss of T11 and mild marrow edema of the T11 vertebral body.  T12 fracture plan likely represented benign edema secondary to compression injuries, junctional mechanical factors, and/or reactive osteitis.  Superinfection was ruled out as a possibility.  Neurosurgery was consulted in the ER, recommended no acute surgical intervention.    She was admitted to the inpatient medicine floor on 7/25.  She presented with worsening weakness in the lower extremities as well as urinary incontinence.  Pain was generally under control with her PTA regimen.  On 7/26, patient was noted to have urinary retention and difficulty with micturition.  She was intermittently cathetered, removing 1300 mL of urine.  Patient experienced some hypotension with her systolics dropping to the 80s, but this was resolved with 500 mL LR bolus.  BP later normalized to systolic 110. Later, a Foley catheter was placed to prevent recurrent CIC's.  Interventional pain management was consulted on 7/27.  They stated there was no indication for axial injections at this time.  Pain is likely facet mediated and she can follow-up outpatient for lumbar RFA consideration.    Interval Updates - Today 07/19/2024:  > Foley catheter placed 7/26  > Interventional pain consulted, no interventions  > NSGY signed off, no interventions    Assessment and Plan      # Recurrent falls   # Acute on Chronic back pain with lower extremities weakness and physical decondition   # Hx of T12 iliac decompression/fusion on 01/2024  - Patient presented after frequent reported falls and worsening weakness in the lower extremities associated with numbness and tingling.   - Patient has reported two falls over the last couple days   - Patient was recently discharged to rehab for a similar issues   - She uses a walker for mobility and has been experiencing pain since her time in rehab  - Stable vitals with labs notable for mild LFT elevation   - UA with 2-10 WBC leukocytes (Recently treated for UTI).    - MRI T spine Long segment spinal posterior instrumentation spanning T12 through the sacroiliac joints. Known periprosthetic fracture of T12 and pedicle screw loosening are  better evaluated on prior CT abdomen and pelvis. 2.  Similar scalloped inferior endplate height loss of T11 with mild marrow edema of the T11 vertebral body and fluid within the T12 fracture plane again likely represent benign edema secondary to compression injuries, junctional mechanical factors, and reactive osteitis. Superinfection is again considered much less likely, particularly given absent secondary paraspinous or epidural inflammatory findings (evaluation somewhat limited by noncontrast imaging). 3.  Residual thoracic and lumbar spondylosis as described, without definite high-grade spinal stenosis.   - ESR 26, CRP 0.23  - Foley catheter placed 7/26 after CIC of 1300 mL  PLAN  > NSGY consulted in the ER, no plan for acute intervention    > Re-engaged on 7/28 due to urinary retention + foley insertion, no changes in management  > Pain Regimen:   > Continue PTA  Lyrica  TID, Lidocaine  patch, Voltaren  gel, Tylenol  PRN, Oxycodone  PRN, Baclofen  PRN  > Neuro checks every 12 hours, delirium precautions  > PT/OT consulted   > Consider outpatient follow-up for EMG  > Interventional Pain consulted, appreciate recs (signed off)   > No indication for axial injection at this time   > Likely facet-mediated pain, consider lumbar RFA as outpatient     # HLD  # Chronic heart failure with preserved EF  # OSA on BIPAP  PLAN  > Hold Crestor   > Hold Bumex  and Spirolactone  > Continue PTA Aspirin   > Continue PTA BIPAP (auto titration, no oxygen bleed)     # IDDM Type 2  PLAN  > Lantus  40 units twice daily  > Aspart 15 units 3 times daily before meals  > LDCF, monitor POC glucose     # RLS  # Depression  # 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   PLAN  > Continue PTA Effexor , Remeron   > Continue PTA Mirapex  RLS     # Rheumatoid arthritis   PLAN  > Hold PTA methotrexate    > Continue PTA Folic acid , Prednisone  5 mg daily  > Continue PTA Celebrex  daily      # Hypothyroidism   PLAN  > Continue PTA synthroid     # GERD   PLAN  > Continue PTA H2 Blocker    FEN:  > DIET DIABETIC (STANDARD) CONSISTENT CARB  > IVF: None    Prophylaxis Review:  VTE ppx: Lovenox  40 mg  GI ppx: H2 blocker  Bowel Regimen: Miralax  1 pkt BID, Senna PRN, Milk of Mag as needed  Last bowel movement:  (PTA)    Code Status:  Full Code  Disposition: Home, pending    Seen and discussed with Dr. Eleanor LITTIE Matters, MD     Mitchael Edge, DO  Preliminary Medicine - PGY1  Pager [phone]  [facility]      Subjective:     Patient seen this morning.  States she had a bowel movement after 3 days of being in the hospital.  Seen by interventional pain this morning, no interventions.  Denies pain while laying down. Still feels generally weak.     Objective:       BP: (96-131)/(54-71)   Temp:  [36.5 ?C (97.7 ?F)-37.2 ?C (99 ?F)]   Pulse:  [63-88]   Respirations:  [15 PER MINUTE-18 PER MINUTE]   SpO2:  [92 %-100 %]   O2 Device: Nasal cannula  O2 Liter Flow: 2 Lpm    Physical Exam  Constitutional:       Appearance: Normal appearance.  HENT:      Head: Normocephalic and atraumatic.      Right Ear: External ear normal.      Left Ear: External ear normal.      Nose: Nose normal.   Eyes:      Extraocular Movements: Extraocular movements intact.      Pupils: Pupils are equal, round, and reactive to light.   Cardiovascular:      Rate and Rhythm: Normal rate and regular rhythm.   Pulmonary:      Effort: Pulmonary effort is normal.      Breath sounds: Normal breath sounds.   Abdominal:      Palpations: Abdomen is soft.   Skin:     General: Skin is warm and dry.   Neurological:      General: No focal deficit present.      Mental Status: She is alert and oriented to person, place, and time. Mental status is at baseline.      Sensory: Sensation is intact.      Motor: Weakness (LLE HF 4/5) present.   Psychiatric:         Mood and Affect: Mood is anxious.            Intake/Output Summary (Last 24 hours) at 07/19/2024 0906  Last data filed at 07/19/2024 0300  Gross per 24 hour   Intake 1480 ml   Output 2250 ml   Net -770 ml      Recent Labs     07/17/24  1216 07/18/24  0333 07/19/24  0333   HGB 12.0 10.9* 11.4*   WBC 6.70 6.40 10.30   PLTCT 221 191 184   NA 136* 137 138   K 4.5 4.0 4.3   CL 102 103 101   CO2 27 26 29    BUN 26* 23 19   CR 0.76 0.56 0.48   GLU 187* 177* 167*   CA 9.2 8.9 9.1   MG  --  2.0 2.0   ALBUMIN 3.6 3.5 3.5   AST 46* 32 29   ALT 62* 50 49   ALKPHOS 113* 112* 115*   TOTBILI 0.4 0.3 0.3       No results for input(s): BNP, HIGHSTROPI, HSTROP0HR, HSTROP2HR, HSTROPDELTA in the last 72 hours.     No image results found.     Meds:  Scheduled Meds:aspirin  chewable tablet 81 mg, 81 mg, Oral, QDAY  celecoxib  (CeleBREX ) capsule 200 mg, 200 mg, Oral, QDAY  diclofenac  sodium (VOLTAREN ) 1 % topical gel 4 g, 4 g, Topical, QID  [Held by Provider] enoxaparin  (LOVENOX ) syringe 40 mg, 40 mg, Subcutaneous, QDAY(21)  famotidine  (PEPCID ) tablet 40 mg, 40 mg, Oral, QDAY  folic acid  (FOLVITE ) tablet 4 mg, 4 mg, Oral, QDAY  insulin  aspart (U-100) (NOVOLOG  FLEXPEN U-100 INSULIN ) injection PEN 0-6 Units, 0-6 Units, Subcutaneous, ACHS (22)  insulin  aspart (U-100) (NOVOLOG  FLEXPEN U-100 INSULIN ) injection PEN 15 Units, 15 Units, Subcutaneous, TID before meals  insulin  glargine (LANTUS  SOLOSTAR U-100 INSULIN ) injection PEN 40 Units, 40 Units, Subcutaneous, BID  levothyroxine  (SYNTHROID ) tablet 88 mcg, 88 mcg, Oral, QDAY  lidocaine  (LIDODERM ) 5 % topical patch 2 patch, 2 patch, Topical, QDAY  mirtazapine  (REMERON ) tablet 15 mg, 15 mg, Oral, QDAY  polyethylene glycol 3350  (MIRALAX ) packet 17 g, 1 packet, Oral, BID  pramipexole  (MIRAPEX ) tablet 0.75 mg, 0.75 mg, Oral, QDAY  predniSONE  (DELTASONE ) tablet 5 mg, 5 mg, Oral, QDAY  pregabalin  (LYRICA ) capsule 75 mg, 75 mg, Oral, Q8H*  [Held  by Provider] rosuvastatin  (CRESTOR ) tablet 20 mg, 20 mg, Oral, QDAY  venlafaxine  XR (EFFEXOR  XR) capsule 225 mg, 225 mg, Oral, QDAY    Continuous Infusions:  PRN and Respiratory Meds:acetaminophen  Q4H PRN, albuterol  sulfate Q6H PRN, baclofen  TID PRN, bisacodyL  QDAY PRN, dextrose  50% (D50) IV PRN, melatonin QHS PRN, ondansetron  Q6H PRN **OR** ondansetron  (ZOFRAN ) IV Q6H PRN, oxyCODONE  Q6H PRN, sennosides-docusate sodium  QDAY PRN      Wounds:        Nutrition:  Malnutrition Details:                                        Intake/Output Summary:  (Last 24 hours)    Intake/Output Summary (Last 24 hours) at 07/19/2024 0906  Last data filed at 07/19/2024 0300  Gross per 24 hour   Intake 1480 ml   Output 2250 ml   Net -770 ml     Stool Occurrence: 1           Radiology/Other Diagnostic Tests:    MRI T-Spine/L-Spine 07/17/24    IMPRESSION   1. Long segment spinal posterior instrumentation spanning T12 through the   sacroiliac joints. Known periprosthetic fracture of T12 and pedicle screw   loosening are better evaluated on prior CT abdomen and pelvis.   2.  Similar scalloped inferior endplate height loss of T11 with mild   marrow edema of the T11 vertebral body and fluid within the T12 fracture   plane again likely represent benign edema secondary to compression   injuries, junctional mechanical factors, and reactive osteitis.   Superinfection is again considered much less likely, particularly given   absent secondary paraspinous or epidural inflammatory findings (evaluation   somewhat limited by noncontrast imaging).   3.  Residual thoracic and lumbar spondylosis as described, without   definite high-grade spinal stenosis. Evaluation of the lumbar spinal canal   is limited due to hardware artifact.

## 2024-07-20 ENCOUNTER — Encounter: Admit: 2024-07-20 | Discharge: 2024-07-20 | Payer: MEDICARE

## 2024-07-20 DIAGNOSIS — Z79899 Other long term (current) drug therapy: Principal | ICD-10-CM

## 2024-07-20 DIAGNOSIS — M0579 Rheumatoid arthritis with rheumatoid factor of multiple sites without organ or systems involvement: Secondary | ICD-10-CM

## 2024-07-20 LAB — POC GLUCOSE
~~LOC~~ BKR POC GLUCOSE: 125 mg/dL — ABNORMAL HIGH (ref 70–100)
~~LOC~~ BKR POC GLUCOSE: 100 mg/dL (ref 70–100)
~~LOC~~ BKR POC GLUCOSE: 119 mg/dL — ABNORMAL HIGH (ref 70–100)
~~LOC~~ BKR POC GLUCOSE: 128 mg/dL — ABNORMAL HIGH (ref 70–100)
~~LOC~~ BKR POC GLUCOSE: 171 mg/dL — ABNORMAL HIGH (ref 70–100)

## 2024-07-20 LAB — MAGNESIUM: ~~LOC~~ BKR MAGNESIUM: 1.9 mg/dL (ref 1.6–2.6)

## 2024-07-20 LAB — CBC AND DIFF
~~LOC~~ BKR MCH: 28 pg — ABNORMAL LOW (ref 26.0–34.0)
~~LOC~~ BKR NEUTROPHILS %: 57 % — ABNORMAL LOW (ref 41.0–77.0)

## 2024-07-20 MED ORDER — RITUXIMAB IVPB
1000 mg | Freq: Once | INTRAVENOUS | 0 refills
Start: 2024-07-20 — End: ?

## 2024-07-20 MED ORDER — METHYLPREDNISOLONE SOD SUC(PF) 125 MG/2 ML IJ SOLR
100 mg | Freq: Once | INTRAVENOUS | 0 refills
Start: 2024-07-20 — End: ?

## 2024-07-20 MED ORDER — DIPHENHYDRAMINE HCL 25 MG PO CAP
25 mg | Freq: Once | ORAL | 0 refills
Start: 2024-07-20 — End: ?

## 2024-07-20 MED ORDER — METHOCARBAMOL 750 MG PO TAB
750 mg | Freq: Three times a day (TID) | ORAL | 0 refills | Status: DC | PRN
Start: 2024-07-20 — End: 2024-07-23
  Administered 2024-07-21 – 2024-07-22 (×2): 750 mg via ORAL

## 2024-07-20 MED ORDER — ACETAMINOPHEN 500 MG PO TAB
500 mg | Freq: Once | ORAL | 0 refills
Start: 2024-07-20 — End: ?

## 2024-07-20 MED ORDER — POLYETHYLENE GLYCOL 3350 17 GRAM PO PWPK
1 | Freq: Two times a day (BID) | ORAL | 0 refills | Status: DC | PRN
Start: 2024-07-20 — End: 2024-07-23
  Administered 2024-07-20 – 2024-07-23 (×4): 17 g via ORAL

## 2024-07-20 NOTE — Progress Notes
 PHYSICAL THERAPY  PROGRESS NOTE          Name: Hailey Rodgers   MRN: [id number]     DOB: 07-Mar-1951      Age: 73 y.o.  Admission Date: 07/16/2024     LOS: 2 days     Date of Service: 07/20/2024    Mobility  Patient Turn/Position: Chair  Mobility Level [facility] Highest Level of Mobility (JH-HLM): Walk 25 feet or more (to doorway/hallway)  Distance Walked (feet): 160 ft  Level of Assistance: Assist X1  Assistive Device: Walker  Activity Limited By: Fatigue;Pain    Subjective  Significant Hospital Events: PMHx of rheumatoid arthritis on chronic predniosone, OSA on BIPAP, chronic heart failure with preserved EF, CAD , HTN, dyslipidemia, IDDM type 2, history of urinary retentions , history of b T12-iliac decompression and fusion ~ 01/2024 presented to [place] ED on 06/23/24 for fever along with acute on chronic back pain, diarrhea, and foul urine/incontinence. Patient was recently discharged from inpatient rehab where she was recovering from spinal surgery. She fell twice after discharge and had urinary incontinence since the fall.  Special Considerations: Current oxygen requirement  Comments: 2L O2 NC  Mental / Cognitive: Lethargic;Arousable;Cooperative  Pain: Complains of pain;Does not rate pain  Pain level: During activity  Pain Location: Back  Pain Interventions: Patient agrees to participate in therapy with current pain level;Patient assisted into position of comfort;Nursing staff notified of patient's pain level  Persons Present: Nursing Staff  Comments: Patient extremely lethargic upon arrival. Requires cold wash cloth to awake. Spills boost on self from falling asleep mid-drinking. Once gown changed and raised head of bed, patient alert enough to participate in therapy.    Home Living Situation  Lives With: Spouse/significant other  Comments: Per patient, spouse not in good health after having a brain bleed, about 3 years ago.  Type of Home: Mobile home  Entry Stairs: Ramp  In-Home Stairs: No stairs  Bathroom Setup: Not accessible  Patient Owned Equipment: Cane: Single point;Walker: Rollator;Hospital bed;Commode: Standard    Prior Level of Function  Level Of Independence: Independent with ADL and community mobility with endurance limitations;Independent with ADL and household mobility with device  Comments: Patient reports she uses a RW in the community but is unable to use RW in house due to size. Uses SPC in house instead  History of Falls in Past 3 Months: Yes  Comments: 4 falls - reports he legs are weak  Comments: Recently discharged from rehab    Bed Mobility/Transfer  Bed Mobility: Supine to Sit: Standby Assist;Head of Bed Elevated;Use of Rail  Transfer Type: Sit to/from Stand  Transfer: Assistance Level: From;Bed;To/From;Toilet;To;Bed Side Chair;Minimal Assist  Transfer: Assistive Device: Nurse, adult  Transfers: Type Of Assistance: Verbal Cues;For Strength Deficit;For Safety Considerations  End Of Activity Status: Up in Chair;Nursing Notified;Instructed Patient to Request Assist with Mobility;Instructed Patient to Use Call Light (chair alarm on)  Comments: Needs total assist for peri cleaning in standing after toileting.    Gait  Gait Distance: 160 feet  Gait: Assistance Level: Minimal Assist  Gait: Assistive Device: Roller Walker  Gait: Descriptors: Decreased foot clearance RLE;Decreased foot clearance LLE;Pace: Slow;Swing-Through Gait;No balance loss;Decreased step length;Decreased knee extension in stance phase RLE;Decreased knee extension in stance phase LLE  Activity Limited By: Complaint of Fatigue;Complaint of Pain    Education  Persons Educated: Patient  Patient Barriers To Learning: None Noted  Teaching Methods: Verbal Instruction  Patient Response: Verbalized Understanding  Topics: Plan/Goals of PT Interventions;Use  of Assistive Device/Orthosis;Mobility Progression;Up with Assist Only;Importance of Increasing Activity;Recommend Continued Therapy;Therapy Schedule    Assessment/Progress  Impaired Mobility Due To: Decreased Strength;Pain;Impaired Balance;Decreased Activity Tolerance;Deconditioning;Medical Status Limitation  Impaired Strength Due To: Pain;Decreased Activity Tolerance;Deconditioning  Assessment/Progress: Should Improve w/ Continued PT;Progressing Toward Goals    AM-PAC 6 Clicks Basic Mobility Inpatient  Turning from your back to your side while in a flat bed without using bed rails: A Little  Moving from lying on your back to sitting on the side of a flat bed without using bedrails : A Little  Moving to and from a bed to a chair (including a wheelchair): A Little  Standing up from a chair using your arms (e.g. wheelchair, or bedside chair): A Little  To walk in hospital room: A Little  Climbing 3-5 steps with a railing: A Lot  Basic Mobility Inpatient Raw Score: 17  Standardized (T-scale) Score: 39.67  Mobility Goal [facility]: JH-HLM 5 Stand >= 1 min    Goals  Goal Formulation: With Patient  Time For Goal Achievement: 3 days, To, 5 days  Patient Will Go Supine To/From Sit: Independently, Ongoing  Patient Will Transfer Bed/Chair: Independently, Ongoing  Patient Will Transfer Sit to Stand: Independently, Ongoing  Patient Will Ambulate: 151-200 Feet, w/ Vannie, w/ Stand By Assist, Ongoing    Plan  Treatment Interventions: Mobility training;Strengthening;Balance activities;Endurance training  Plan Frequency: 1-2 Days per Week  PT Plan for Next Visit: repeated sit to stands, gait tolerance, foot taps to step for LE strengthening    PT Discharge Recommendations  Recommendation: Inpatient setting  Patient Currently Requires Physical Assist With: All mobility;All personal care ADLs;All home functioning ADLs    Therapist: Gordy Blunt, [place] [id number]  Date: 07/20/2024

## 2024-07-20 NOTE — Progress Notes
 RT Adult Assessment Note    NAME:Hailey Rodgers             MRN: [id number]             DOB:12-Mar-1951          AGE: 73 y.o.  ADMISSION DATE: 07/16/2024             DAYS ADMITTED: LOS: 2 days    Additional Comments:  Impressions of the patient: Pt sitting in chair.  No respiratory distress noted.    Intervention(s)/outcome(s): Respiratory assessment completed  Patient education that was completed:   Recommendations to the care team:     Vital Signs:  Pulse: 83  RR:    SpO2: 97 %  O2 Device: None (Room air)  Liter Flow:    O2%:      Breath Sounds:   Breath Sounds WDL: Within Defined Limits  Respiratory Effort:   Respiratory WDL: Within Defined Limits  Comments:

## 2024-07-20 NOTE — Consults
 Physical Medicine & Rehabilitation Consult Service    Name: Hailey Rodgers   MRN: [id number]     DOB: [birth year]      Age: 73 y.o.  Admission Date: 07/16/2024     LOS: 2 days     Date of Service: 07/20/2024        Date of Service: 07/20/2024  Financial Class: Payor: MEDICARE / Plan: MEDICARE PART A AND B / Product Type: Medicare /   Referring Physician: Ollis Eleanor CROME, MD  Reason for Consult: evaluate for Post-Acute Rehab/Placement  Precautions: Fall  Weight Bearing Precautions: None    Assessment & Plan:  Principal Problem:    Recurrent falls  Active Problems:    Generalized anxiety disorder    Frequent falls    Rheumatoid arthritis (CMS-HCC)    Cervical radiculopathy at C8    Hypothyroidism (acquired)    Coronary artery calcification    Chronic heart failure with preserved ejection fraction (CMS-HCC)    Back pain with sciatica    Gait abnormality  Impaired mobility/ADLs  Impaired transfers       Hailey Rodgers is a 73 y.o. year old female admitted to [facility] on  07/16/2024 with the following issues:    stenosis - lumbar    Impairments: pain, poor activity tolerance, and weakness  Activity Limitations:  grooming, bathing, dressing - lower, toileting, transfers, and ambulation  Participation Restrictions: unable to return home safely  Family / Patient Dispositional Goals: return home with family supervision    Overall Functional Goals  Gait and mobility Mod I    Transfers Mod I    Upper body dressing Mod I    Lower body dressing Mod I    Toileting Mod I    Bathing Mod I    Cognition / Communication Cognition grossly intact          Recommendations:  Post-acute care rehabilitation needs: subacute rehabilitation vs Community based acute inpatient rehabilitation.     Barriers/Facilitators:  Barriers: Poor family/social support, Poor strength/endurance, and Uncontrolled pain  Facilitators: good home setup, patient motivation, and improving strength / endurance  Rehabilitation Prognosis: Fair    Tolerance for three hours of therapy a day: Good  ___________________________________________________________________________    Impaired gait/mobility/transfers:  The patient will benefit from continued work with PT to address mobility deficits    Impaired ADLs:  The patient will benefit from ongoing OT to address functional deficits    Thank you for this consultation.  Please call our consult pager with questions or concerns.    Elsie Rodgers Minks, DO  Physical Medicine & Rehabilitation PGY-3  Available by Voalte     History of Present Illness:    CC: Falls    Hospital Course: [name] is a pleasant 73 y.o. female with PMH of rheumatoid arthritis on chronic predniosone, OSA on BIPAP, chronic heart failure with preserved EF, CAD , HTN, dyslipidemia, IDDM type 2, history of urinary retentions , history of b T12-iliac decompression and fusion ~ 01/2024 presented to [place] ED on 06/23/24 for fever along with acute on chronic back pain, diarrhea, and urine incontinence. [place] spinal surgery and similar hospital admissions of falls/weakness. She fell twice after discharge and had urinary incontinence since the fall.  MRI T-spine and L-spine showed known periprosthetic fracture of T12 and pedicle screw loosening.  Also showed scalloped inferior endplate height loss of T11 and  mild marrow edema of the T11 vertebral body.  T12 fracture plan likely represented benign edema secondary to compression injuries, junctional mechanical factors, and/or reactive osteitis.  Superinfection was ruled out as a possibility.  Neurosurgery was consulted in the ER, recommended no acute surgical intervention. On 7/26, patient was noted to have urinary retention and difficulty with micturition.  She was intermittently cathetered, removing 1300 mL of urine.  Patient experienced some hypotension with her systolics dropping to the 80s, but this was resolved with 500 mL LR bolus.  BP later normalized to systolic 110. Later, a Foley catheter was placed to prevent recurrent CIC's.  Interventional pain management was consulted on 7/27, who stated no indication for injection at this time.        The primary team has consulted PT and OT, and will continue working with therapies to address functional and mobility deficits, rehab is now consulted for post-acute rehab/placement recommendations.      The patient's family/social support consists of: Daughter.    Past Medical History:    Age-related physical debility    Allergic rhinitis    Arthritis    Asthma    Bronchitis    Cancer (CMS-HCC)    Cervical stenosis of spinal canal    Convulsion (CMS-HCC)    Coronary artery disease    COVID-19 virus infection    Disorganized thinking    Diverticulosis    DM (diabetes mellitus) (CMS-HCC)    Dyslipidemia    Dyspnea    Fatty liver disease, nonalcoholic    Gastroesophageal reflux disease without esophagitis    GERD (gastroesophageal reflux disease)    Hypertriglyceridemia    Hypothyroidism    Hypothyroidism due to acquired atrophy of thyroid    Infection    Leg weakness    Lumbar stenosis    Lung disease    Lung nodule    Memory loss    Movement disorder    Narcolepsy    Obesity    On supplemental oxygen therapy    OSA (obstructive sleep apnea)    Other dysphagia    Other hyperlipidemia    Peripheral neuropathy    Polypharmacy    Postmenopausal    Pseudoseizures    Recurrent major depressive disorder, in partial remission    Rhabdomyolysis    Rheumatoid arthritis (CMS-HCC)    Seizures (CMS-HCC)    Sleep disorder    Syncope and collapse    Type 2 diabetes mellitus with hyperglycemia, with long-term current use of insulin  (CMS-HCC)    Unspecified deficiency anemia    Urinary incontinence    Urinary problem    Vision decreased        Surgical History:   Procedure Laterality Date    HX ADENOIDECTOMY  1962    HX TONSILLECTOMY  1962    TUBAL LIGATION  1977    RECTAL SURGERY  1979 for rectal prolapse; bladder repair    CHOLECYSTECTOMY  1984    HYSTERECTOMY  2000    BREAST BIOPSY Right 2012    COLONOSCOPY  2016    CERVICAL 3 TO THORACIC 7 DECOMPRESSION AND FUSION Bilateral 07/18/2015    Performed by Vivica Domino, MD at [facility] OR    BLEPHAROPLASTY      CARDIOVASCULAR STRESS TEST      CERVIX SURGERY  2016    ECHOCARDIOGRAM PROCEDURE  2022    ELECTROCARDIOGRAM      EVENT MONITOR      Have Loop monitor    HERNIA REPAIR  1984/2007  SEPTOPLASTY  1986/2013    TRANSESOPHAGEAL ECHO      UPPER GASTROINTESTINAL ENDOSCOPY          Social History     Socioeconomic History    Marital status: Married    Number of children: 2   Occupational History    Occupation: DISABLE    Tobacco Use    Smoking status: Never    Smokeless tobacco: Never   Vaping Use    Vaping status: Never Used   Substance and Sexual Activity    Alcohol use: Never    Drug use: Never    Sexual activity: Not Currently     Partners: Male     Birth control/protection: Post-menopausal, None       Family History   Problem Relation Name Age of Onset    Cancer Mother [name]     Thyroid Disease Mother [name]     Hypertension Mother Psychiatric nurse     Cataract Mother [name]     Cancer Father RL         pancreatic    Cancer Other [name]     Stroke Paternal Grandfather Benjiamin!     Cancer Paternal Aunt      Coronary Artery Disease Paternal Grandmother      Diabetes Daughter [name]     Migraines Daughter [name]     Strabismus Neg Hx      Retinal Detachment Neg Hx      Neurologic Disorder Neg Hx      Macular Degen Neg Hx      Glaucoma Neg Hx      Blindness Neg Hx      Amblyopia Neg Hx      Autoimmune Disease Neg Hx          Scheduled Meds:aspirin  chewable tablet 81 mg, 81 mg, Oral, QDAY  celecoxib  (CeleBREX ) capsule 200 mg, 200 mg, Oral, QDAY  diclofenac  sodium (VOLTAREN ) 1 % topical gel 4 g, 4 g, Topical, QID  enoxaparin  (LOVENOX ) syringe 40 mg, 40 mg, Subcutaneous, QDAY(21)  famotidine  (PEPCID ) tablet 40 mg, 40 mg, Oral, QDAY  folic acid  (FOLVITE ) tablet 4 mg, 4 mg, Oral, QDAY  insulin  aspart (U-100) (NOVOLOG  FLEXPEN U-100 INSULIN ) injection PEN 0-6 Units, 0-6 Units, Subcutaneous, ACHS (22)  insulin  aspart (U-100) (NOVOLOG  FLEXPEN U-100 INSULIN ) injection PEN 15 Units, 15 Units, Subcutaneous, TID before meals  insulin  glargine (LANTUS  SOLOSTAR U-100 INSULIN ) injection PEN 40 Units, 40 Units, Subcutaneous, BID  levothyroxine  (SYNTHROID ) tablet 88 mcg, 88 mcg, Oral, QDAY  lidocaine  (LIDODERM ) 5 % topical patch 2 patch, 2 patch, Topical, QDAY  mirtazapine  (REMERON ) tablet 15 mg, 15 mg, Oral, QHS  pramipexole  (MIRAPEX ) tablet 0.75 mg, 0.75 mg, Oral, QDAY  predniSONE  (DELTASONE ) tablet 5 mg, 5 mg, Oral, QDAY  pregabalin  (LYRICA ) capsule 75 mg, 75 mg, Oral, Q8H*  rosuvastatin  (CRESTOR ) tablet 20 mg, 20 mg, Oral, QHS  venlafaxine  XR (EFFEXOR  XR) capsule 225 mg, 225 mg, Oral, QDAY    Continuous Infusions:  PRN and Respiratory Meds:acetaminophen  Q4H PRN, albuterol  sulfate Q6H PRN, artificial tears multi dose PRN, [Held by Provider] baclofen  TID PRN, bisacodyL  QDAY PRN, dextrose  50% (D50) IV PRN, melatonin QHS PRN, ondansetron  Q6H PRN **OR** ondansetron  (ZOFRAN ) IV Q6H PRN, oxyCODONE  Q6H PRN, polyethylene glycol 3350  BID PRN, sennosides-docusate sodium  QDAY PRN       Allergies   Allergen Reactions    Amoxicillin-Pot Clavulanate HIVES, ITCHING and URTICARIA    Gemfibrozil HIVES    Morphine  MENTAL STATUS CHANGES     Patient  became unresponsive within 1 minute of 4 mg of morphine .  Did not become apneic.  Required narcan     Trazodone HALLUCINATIONS    Adhesive Tape (Rosins) RASH    Atorvastatin SEE COMMENTS     States it caused her to have a fatty liver.   Reaction: LFT elevation; Comment: LIPITOR  Reaction: LFT elevation; Comment: LIPITOR  Reaction: LFT elevation; Comment: LIPITOR  Reaction: LFT elevation; Comment: LIPITOR      Latex RASH    Bupropion SEE COMMENTS     Allergy recorded in SMS: WELLBUTRIN~Reactions: ABNORMAL SEIZUR    Cymbalta [Duloxetine] UNKNOWN    Levaquin [Levofloxacin] DIARRHEA                 Prior Level of Function:     he patient was modified independent for all mobility/ambulation and activities of daily living.      Home Environment:  No data recorded  No data recorded  Type of Home: Mobile home (07/20/2024  9:00 AM)    Entry Stairs: Ramp (07/20/2024  9:00 AM)    In-Home Stairs: No stairs (07/20/2024  9:00 AM)    No data recorded  No data recorded  @ADDRESSFULL @      Current Level Of Function:  PT Gait:Gait Distance: 160 feet Gait: Assistance Level: Minimal Assist Gait: Assistive Device: Roller Walker  Bed Mobility/Transfers  Bed Mobility: Supine to Sit: Standby Assist, Head of Bed Elevated, Use of Rail  Comments: Patient up in chair at end of session  Transfer Type: Sit to/from Stand  Transfer: Assistance Level: From, Bed, To/From, Pensions consultant, To, Bed Side Chair, Minimal Assist  Transfer: Assistive Device: Nurse, adult  Transfers: Type Of Assistance: Materials engineer, For Strength Deficit, For Safety Considerations  Other Transfer Type: Sit to/from Stand  Other Transfer: Assistance Level: To/From, Bed Side Chair, Minimal Assist (x5 reps)  Other Transfer: Assistive Device: Nurse, adult  Other Transfer: Type Of Assistance: Verbal Cues, For Balance, For Strength Deficit, For Safety Considerations  End Of Activity Status: Up in Chair, Nursing Notified, Instructed Patient to Request Assist with Mobility, Instructed Patient to Use Call Light (chair alarm on)  Comments: Needs total assist for peri cleaning in standing after toileting.   OT ADL's  Where Assessed: Edge of Bed  LE Dressing Assist: Minimal Assist  LE Dressing Deficits: Don/Doff R Sock, Don/Doff L Sock  Toileting Assist: Total Assist  Comment: minA to don socks while seated EOB, retropulsive and requires increased time.   SLP COGNITIVE EVALUATION SUMMARY     PRAGMATICS:    BEHAVIOR:    AUDITORY COMPREHENSION:      ORIENTATION:    AUDITORY ATTENTION/WORKING MEMORY:    AUDITORY MEMORY/SUSTAINED ATTENTION:    NEW LEARNING:    SEQUENCING/ORGANIZATION:    PROBLEM SOLVING:    REASONING:      MATH/MONEY SKILLS:      VISUAL PERCEPTUAL:    SWALLOW EVALUATION SUMMARY                       Review of Systems:    A 14 point review of systems was negative except for: that noted in the HPI      Physical Exam:    BP: 117/58 (07/29 0910)  Temp: 36.2 ?C (97.2 ?F) (07/29 0910)  Pulse: 53 (07/29 0816)  Respirations: 14 PER MINUTE (07/29 0910)  SpO2: 92 % (07/29 0910)  O2 Device: Nasal cannula (07/29 0910)  O2 Liter Flow: 2 Lpm (07/29 0910)  Body mass  index is 28.74 kg/m?.     Gen: Alert & Oriented X 3  HEENT: EOMI  Neck: Supple, no elevated JVP  Heart: Extremities well perfused  Lungs: non labored breathing  Abdomen: Soft, non-tender, non-distended, +BS  GU:  - Foley  Skin: no gross lesions appreciated  Ext: purposeful movement of extremities   MS:   Root Right Left   Shoulder Abduction C5 5 5   Elbow Flexion C5 5 5   Elbow Extension C7 5 5   Wrist Extension C6 5 5   Finger Flexion C8 5 5   Finger Abduction T1 5 5   Hip Flexion L2 5- 4   Knee Flexion L5/S1 5- 4   Knee Extension L3 5- 4   Dorsiflexion L4 5 5   Plantarflexion S1 5 5   EHL Extension L5 5 5     Neuro:  Cranial Nerves Cranial Nerves 2-12 are grossly intact   DTR's 1+ throughout   Babinski Plantar Reflex is Downgoing Bilaterally   Upper Extremity Tone Normal   Lower Extremity Tone Normal   Upper Extremity Sensation Intact to light touch bilaterally   Lower Extremity Sensation Diminished to light touch throughout the left LE   Clonus Negative Bilaterally   Proprioception Intact Bilaterally   Memory/Cognition/Speech Within limits           Intake/Output Summary (Last 24 hours) at 07/20/2024 1035  Last data filed at 07/20/2024 0500  Gross per 24 hour   Intake 1260 ml   Output 2710 ml   Net -1450 ml        Hematology:    Lab Results   Component Value Date    HGB 11.0 07/20/2024    HGB 12.7 09/03/2023    HCT 32.5 07/20/2024    HCT 38.6 09/03/2023    PLTCT 180 07/20/2024    PLTCT 251 09/03/2023    WBC 6.20 07/20/2024    WBC 6.5 09/03/2023    NEUT 57.9 07/20/2024    NEUT 67 09/03/2023    ANC 3.60 07/20/2024    ANC 4.40 09/03/2023    ALC 1.60 07/20/2024    ALC 1.07 09/03/2023    MONA 9.8 07/20/2024    MONA 10 09/03/2023    AMC 0.60 07/20/2024    AMC 0.67 09/03/2023    EOSA 5.1 07/20/2024    EOSA 5 09/03/2023    ABC 0.00 07/20/2024    ABC 0.05 09/03/2023    MCV 82.8 07/20/2024    MCV 88.2 09/03/2023    MCH 28.1 07/20/2024    MCH 29.1 09/03/2023    MCHC 33.9 07/20/2024    MCHC 33.0 09/03/2023    MPV 8.5 07/20/2024    MPV 8.1 09/03/2023    RDW 18.9 07/20/2024    RDW 14.5 09/03/2023   , Coagulation:    Lab Results   Component Value Date    PT 13.4 06/25/2024    PT 11.7 02/11/2023    PTT 28.7 06/25/2024    PTT 29.5 02/11/2023    INR 1.2 06/25/2024    INR 1.1 02/11/2023    and General Chemistry:    Lab Results   Component Value Date    NA 141 07/20/2024    NA 141 09/03/2023    K 4.1 07/20/2024    K 4.1 09/03/2023    CL 102 07/20/2024    CL 104 09/03/2023    CO2 32 07/20/2024    CO2 25 09/03/2023    GAP 7 07/20/2024  GAP 12 09/03/2023    BUN 18 07/20/2024    BUN 28 09/03/2023    CR 0.52 07/20/2024    CR 0.77 09/03/2023    GLU 163 07/20/2024    GLU 149 09/03/2023    GLU 90 11/24/2004    CA 9.3 07/20/2024    CA 10.0 09/03/2023    ALBUMIN 3.4 07/20/2024    ALBUMIN 4.3 09/03/2023    OBSCA 1.15 07/18/2015    MG 1.9 07/20/2024    MG 2.1 06/10/2023    TOTBILI 0.2 07/20/2024    TOTBILI 0.4 09/03/2023    PO4 4.1 06/27/2024    PO4 4.6 06/10/2023        Radiology: Reviewed    MRI L Spine:     IMPRESSION       1. Long segment spinal posterior instrumentation spanning T12 through the   sacroiliac joints. Known periprosthetic fracture of T12 and pedicle screw   loosening are better evaluated on prior CT abdomen and pelvis.   2.  Similar scalloped inferior endplate height loss of T11 with mild   marrow edema of the T11 vertebral body and fluid within the T12 fracture   plane again likely represent benign edema secondary to compression   injuries, junctional mechanical factors, and reactive osteitis.   Superinfection is again considered much less likely, particularly given   absent secondary paraspinous or epidural inflammatory findings (evaluation   somewhat limited by noncontrast imaging).   3.  Residual thoracic and lumbar spondylosis as described, without   definite high-grade spinal stenosis. Evaluation of the lumbar spinal canal   is limited due to hardware artifact.      Elsie LITTIE Minks, DO

## 2024-07-20 NOTE — Progress Notes
 Internal Medicine Daily Progress Note      Patient's Name:  Hailey Rodgers MRN: [id number]   Today's Date:  07/20/2024  Admission Date: 07/16/2024  LOS: 2 days    Problem list  Principal Problem:    Recurrent falls  Active Problems:    Generalized anxiety disorder    Frequent falls    Rheumatoid arthritis (CMS-HCC)    Cervical radiculopathy at C8    Hypothyroidism (acquired)    Coronary artery calcification    Chronic heart failure with preserved ejection fraction (CMS-HCC)    Back pain with sciatica    Brief Hospital Course     Hailey Rodgers is a 73 y.o. female with history of rheumatoid arthritis on chronic predniosone,  OSA on BIPAP, chronic heart failure with preserved EF,  CAD , HTN, dyslipidemia, IDDM type 2, history of urinary retentions ,  history of b  T12-iliac decompression and fusion ~ 01/2024  presented to [facility] on 06/23/24 for fever along with acute on chronic back pain, diarrhea, and foul urine/incontinence.  Patient was recently discharged from inpatient rehab where she was recovering from spinal surgery.  She fell twice after discharge and had urinary incontinence since the fall.  MRI T-spine and L-spine showed known periprosthetic fracture of T12 and pedicle screw loosening.  Also showed scalloped inferior endplate height loss of T11 and mild marrow edema of the T11 vertebral body.  T12 fracture plan likely represented benign edema secondary to compression injuries, junctional mechanical factors, and/or reactive osteitis.  Superinfection was ruled out as a possibility.  Neurosurgery was consulted in the ER, recommended no acute surgical intervention.    She was admitted to the inpatient medicine floor on 7/25.  She presented with worsening weakness in the lower extremities as well as urinary incontinence.  Pain was generally under control with her PTA regimen.  On 7/26, patient was noted to have urinary retention and difficulty with micturition.  She was intermittently cathetered, removing 1300 mL of urine.  Patient experienced some hypotension with her systolics dropping to the 80s, but this was resolved with 500 mL LR bolus.  BP later normalized to systolic 110. Later, a Foley catheter was placed to prevent recurrent CIC's.  Interventional pain management was consulted on 7/27.  They stated there was no indication for axial injections at this time.  Pain is likely facet mediated and she can follow-up outpatient for lumbar RFA consideration.  Foley catheter was removed on 7/28 after patient was able to void on her own.  Baclofen  was discontinued on 7/29 due to patient's complaint of sedation and hypotension.  Stated that she was on Robaxin  750 mg 3 times daily with good results, added on 7/29.    Interval Updates - Today 07/20/2024:  > Foley catheter removed 7/28  > Baclofen  discontinued, Robaxin  750 mg 3 times daily as needed added  > Dispo: IPR, pending    Assessment and Plan      # Recurrent falls   # Acute on Chronic back pain with lower extremities weakness and physical decondition   # Hx of T12 iliac decompression/fusion on 01/2024  - Patient presented after frequent reported falls and worsening weakness in the lower extremities associated with numbness and tingling.   - Patient has reported two falls over the last couple days   - Patient was recently discharged to rehab for a similar issues   - She uses a walker for mobility and has been experiencing pain since her time in  rehab  - Stable vitals with labs notable for mild LFT elevation   - UA with 2-10 WBC leukocytes (Recently treated for UTI).    - MRI T spine Long segment spinal posterior instrumentation spanning T12 through the sacroiliac joints. Known periprosthetic fracture of T12 and pedicle screw loosening are better evaluated on prior CT abdomen and pelvis. 2.  Similar scalloped inferior endplate height loss of T11 with mild marrow edema of the T11 vertebral body and fluid within the T12 fracture plane again likely represent benign edema secondary to compression injuries, junctional mechanical factors, and reactive osteitis. Superinfection is again considered much less likely, particularly given absent secondary paraspinous or epidural inflammatory findings (evaluation somewhat limited by noncontrast imaging). 3.  Residual thoracic and lumbar spondylosis as described, without definite high-grade spinal stenosis.   - ESR 26, CRP 0.23  - Foley catheter placed 7/26 after CIC of 1300 mL, removed on 7/28  PLAN  > NSGY consulted in the ER, no plan for acute intervention    > Re-engaged on 7/28 due to urinary retention + foley insertion, no changes in management  > Interventional Pain consulted, appreciate recs (signed off)   > No indication for axial injection at this time   > Likely facet-mediated pain, consider lumbar RFA as outpatient  > Neuro checks every 12 hours, delirium precautions  > PT/OT consulted, following  > Pain Regimen:   > Continue PTA  Lyrica  TID, Lidocaine  patch, Voltaren  gel, Tylenol  PRN, Oxycodone  PRN, Robaxin  TID PRN      # HLD  # Chronic heart failure with preserved EF  # OSA on BIPAP  PLAN  > Hold Crestor   > Hold Bumex  and Spirolactone  > Continue PTA Aspirin   > Continue PTA BIPAP (auto titration, no oxygen bleed)       # IDDM Type 2  PLAN  > Lantus  40 units twice daily  > Aspart 15 units 3 times daily before meals  > LDCF, monitor POC glucose       # RLS  # Depression  # 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   PLAN  > Continue PTA Effexor , Remeron   > Continue PTA Mirapex  RLS       # Rheumatoid arthritis   PLAN  > Hold PTA methotrexate    > Continue PTA Folic acid , Prednisone  5 mg daily  > Continue PTA Celebrex  daily        # Hypothyroidism   PLAN  > Continue PTA synthroid       # GERD   PLAN  > Continue PTA H2 Blocker      FEN:  > DIET DIABETIC (STANDARD) CONSISTENT CARB  > IVF: None      Prophylaxis Review:  VTE ppx: Lovenox  40 mg  GI ppx: H2 blocker  Bowel Regimen: Miralax  1 pkt BID, Senna PRN, Milk of Mag as needed  Last bowel movement: 07/19/24      Code Status:  Full Code  Disposition: Home, pending    Seen and discussed with Dr. Eleanor LITTIE Matters, MD     Hailey Edge, DO  Preliminary Medicine - PGY1  Pager [phone]    [facility]      Subjective:     Patient seen and evaluated this morning.  States that she is tired.  She is happy about getting the catheter removed yesterday.  Denies pain.  Requests that she have Robaxin  instead of baclofen .  No acute concerns.    Objective:  BP: (109-122)/(45-70)   Temp:  [36.2 ?C (97.2 ?F)-37.1 ?C (98.7 ?F)]   Pulse:  [52-89]   Respirations:  [12 PER MINUTE-18 PER MINUTE]   SpO2:  [92 %-100 %]   O2 Device: Nasal cannula  O2 Liter Flow: 2 Lpm    Physical Exam  Constitutional:       Appearance: Normal appearance.   HENT:      Head: Normocephalic and atraumatic.   Eyes:      Extraocular Movements: Extraocular movements intact.      Pupils: Pupils are equal, round, and reactive to light.   Cardiovascular:      Rate and Rhythm: Normal rate and regular rhythm.   Pulmonary:      Effort: Pulmonary effort is normal.      Breath sounds: Normal breath sounds.   Abdominal:      Palpations: Abdomen is soft.   Skin:     General: Skin is warm and dry.   Neurological:      General: No focal deficit present.      Mental Status: She is oriented to person, place, and time. She is lethargic.      Sensory: Sensation is intact.      Motor: Weakness (BLE HF 4/5, L>R) present.   Psychiatric:         Mood and Affect: Mood normal.         Behavior: Behavior normal. Behavior is cooperative.            Intake/Output Summary (Last 24 hours) at 07/20/2024 1003  Last data filed at 07/20/2024 0500  Gross per 24 hour   Intake 1260 ml   Output 2710 ml   Net -1450 ml      Recent Labs     07/18/24  0333 07/19/24  0333 07/20/24  0334   HGB 10.9* 11.4* 11.0*   WBC 6.40 10.30 6.20   PLTCT 191 184 180   NA 137 138 141   K 4.0 4.3 4.1   CL 103 101 102   CO2 26 29 32*   BUN 23 19 18    CR 0.56 0.48 0.52   GLU 177* 167* 163*   CA 8.9 9.1 9.3   MG 2.0 2.0 2.0   ALBUMIN 3.5 3.5 3.4*   AST 32 29 21   ALT 50 49 37   ALKPHOS 112* 115* 108   TOTBILI 0.3 0.3 0.2       No results for input(s): BNP, HIGHSTROPI, HSTROP0HR, HSTROP2HR, HSTROPDELTA in the last 72 hours.     No image results found.     Meds:  Scheduled Meds:aspirin  chewable tablet 81 mg, 81 mg, Oral, QDAY  celecoxib  (CeleBREX ) capsule 200 mg, 200 mg, Oral, QDAY  diclofenac  sodium (VOLTAREN ) 1 % topical gel 4 g, 4 g, Topical, QID  enoxaparin  (LOVENOX ) syringe 40 mg, 40 mg, Subcutaneous, QDAY(21)  famotidine  (PEPCID ) tablet 40 mg, 40 mg, Oral, QDAY  folic acid  (FOLVITE ) tablet 4 mg, 4 mg, Oral, QDAY  insulin  aspart (U-100) (NOVOLOG  FLEXPEN U-100 INSULIN ) injection PEN 0-6 Units, 0-6 Units, Subcutaneous, ACHS (22)  insulin  aspart (U-100) (NOVOLOG  FLEXPEN U-100 INSULIN ) injection PEN 15 Units, 15 Units, Subcutaneous, TID before meals  insulin  glargine (LANTUS  SOLOSTAR U-100 INSULIN ) injection PEN 40 Units, 40 Units, Subcutaneous, BID  levothyroxine  (SYNTHROID ) tablet 88 mcg, 88 mcg, Oral, QDAY  lidocaine  (LIDODERM ) 5 % topical patch 2 patch, 2 patch, Topical, QDAY  mirtazapine  (REMERON ) tablet 15 mg, 15 mg, Oral, QHS  pramipexole  (MIRAPEX )  tablet 0.75 mg, 0.75 mg, Oral, QDAY  predniSONE  (DELTASONE ) tablet 5 mg, 5 mg, Oral, QDAY  pregabalin  (LYRICA ) capsule 75 mg, 75 mg, Oral, Q8H*  rosuvastatin  (CRESTOR ) tablet 20 mg, 20 mg, Oral, QHS  venlafaxine  XR (EFFEXOR  XR) capsule 225 mg, 225 mg, Oral, QDAY    Continuous Infusions:  PRN and Respiratory Meds:acetaminophen  Q4H PRN, albuterol  sulfate Q6H PRN, artificial tears multi dose PRN, [Held by Provider] baclofen  TID PRN, bisacodyL  QDAY PRN, dextrose  50% (D50) IV PRN, melatonin QHS PRN, ondansetron  Q6H PRN **OR** ondansetron  (ZOFRAN ) IV Q6H PRN, oxyCODONE  Q6H PRN, polyethylene glycol 3350  BID PRN, sennosides-docusate sodium  QDAY PRN      Wounds:        Nutrition:  Malnutrition Details: Intake/Output Summary:  (Last 24 hours)    Intake/Output Summary (Last 24 hours) at 07/20/2024 1003  Last data filed at 07/20/2024 0500  Gross per 24 hour   Intake 1260 ml   Output 2710 ml   Net -1450 ml     Stool Occurrence: 0           Radiology/Other Diagnostic Tests:    MRI T-Spine/L-Spine 07/17/24    IMPRESSION   1. Long segment spinal posterior instrumentation spanning T12 through the   sacroiliac joints. Known periprosthetic fracture of T12 and pedicle screw   loosening are better evaluated on prior CT abdomen and pelvis.   2.  Similar scalloped inferior endplate height loss of T11 with mild   marrow edema of the T11 vertebral body and fluid within the T12 fracture   plane again likely represent benign edema secondary to compression   injuries, junctional mechanical factors, and reactive osteitis.   Superinfection is again considered much less likely, particularly given   absent secondary paraspinous or epidural inflammatory findings (evaluation   somewhat limited by noncontrast imaging).   3.  Residual thoracic and lumbar spondylosis as described, without   definite high-grade spinal stenosis. Evaluation of the lumbar spinal canal   is limited due to hardware artifact.

## 2024-07-21 LAB — POC GLUCOSE
~~LOC~~ BKR POC GLUCOSE: 87 mg/dL (ref 70–100)
~~LOC~~ BKR POC GLUCOSE: 150 mg/dL — ABNORMAL HIGH (ref 70–100)
~~LOC~~ BKR POC GLUCOSE: 258 mg/dL — ABNORMAL HIGH (ref 70–100)
~~LOC~~ BKR POC GLUCOSE: 97 mg/dL (ref 70–100)

## 2024-07-21 MED ORDER — LACTOBACILLUS RHAMNOSUS GG 15 BILLION CELL PO CPSP
1 | Freq: Two times a day (BID) | ORAL | 0 refills | Status: DC
Start: 2024-07-21 — End: 2024-07-23
  Administered 2024-07-22 – 2024-07-23 (×3): 1 via ORAL

## 2024-07-21 MED ORDER — FERROUS SULFATE 325 MG (65 MG IRON) PO TAB
325 mg | ORAL | 0 refills | Status: DC
Start: 2024-07-21 — End: 2024-07-23
  Administered 2024-07-23: 14:00:00 325 mg via ORAL

## 2024-07-21 MED ORDER — MULTIVITAMIN PO TAB
1 | Freq: Every day | ORAL | 0 refills | Status: DC
Start: 2024-07-21 — End: 2024-07-23
  Administered 2024-07-22 – 2024-07-23 (×2): 1 via ORAL

## 2024-07-21 MED ORDER — MUPIROCIN 2 % TP OINT
Freq: Two times a day (BID) | TOPICAL | 0 refills | Status: DC | PRN
Start: 2024-07-21 — End: 2024-07-23

## 2024-07-21 MED ORDER — CALCIUM CARBONATE 200 MG CALCIUM (500 MG) PO CHEW
500 mg | ORAL | 0 refills | Status: DC | PRN
Start: 2024-07-21 — End: 2024-07-23

## 2024-07-21 MED ORDER — LORATADINE 10 MG PO TAB
10 mg | Freq: Every day | ORAL | 0 refills | Status: DC
Start: 2024-07-21 — End: 2024-07-23
  Administered 2024-07-22 – 2024-07-23 (×2): 10 mg via ORAL

## 2024-07-21 NOTE — Progress Notes
 Internal Medicine Daily Progress Note      Patient's Name:  Hailey Rodgers MRN: [id number]   Today's Date:  07/21/2024  Admission Date: 07/16/2024  LOS: 3 days    Problem list  Principal Problem:    Recurrent falls  Active Problems:    Generalized anxiety disorder    Frequent falls    Rheumatoid arthritis (CMS-HCC)    Cervical radiculopathy at C8    Hypothyroidism (acquired)    Coronary artery calcification    Chronic heart failure with preserved ejection fraction (CMS-HCC)    Back pain with sciatica    Brief Hospital Course     Hailey Rodgers is a 73 y.o. female with history of rheumatoid arthritis on chronic predniosone,  OSA on BIPAP, chronic heart failure with preserved EF,  CAD , HTN, dyslipidemia, IDDM type 2, history of urinary retentions ,  history of b  T12-iliac decompression and fusion ~ 01/2024  presented to  ED on 06/23/24 for fever along with acute on chronic back pain, diarrhea, and foul urine/incontinence.  Patient was recently discharged from inpatient rehab where she was recovering from spinal surgery.  She fell twice after discharge and had urinary incontinence since the fall.  MRI T-spine and L-spine showed known periprosthetic fracture of T12 and pedicle screw loosening.  Also showed scalloped inferior endplate height loss of T11 and mild marrow edema of the T11 vertebral body.  T12 fracture plan likely represented benign edema secondary to compression injuries, junctional mechanical factors, and/or reactive osteitis.  Superinfection was ruled out as a possibility.  Neurosurgery was consulted in the ER, recommended no acute surgical intervention.    She was admitted to the inpatient medicine floor on 7/25.  She presented with worsening weakness in the lower extremities as well as urinary incontinence.  Pain was generally under control with her PTA regimen.  On 7/26, patient was noted to have urinary retention and difficulty with micturition.  She was intermittently cathetered, removing 1300 mL of urine.  Patient experienced some hypotension with her systolics dropping to the 80s, but this was resolved with 500 mL LR bolus.  BP later normalized to systolic 110. Later, a Foley catheter was placed to prevent recurrent CIC's.  Interventional pain management was consulted on 7/27.  They stated there was no indication for axial injections at this time.  Pain is likely facet mediated and she can follow-up outpatient for lumbar RFA consideration.  Foley catheter was removed on 7/28 after patient was able to void on her own.  Baclofen  was discontinued on 7/29 due to patient's complaint of sedation and hypotension.  Stated that she was on Robaxin  750 mg 3 times daily with good results, added on 7/29.    Interval Updates - Today 07/21/2024:  > Continue working with PT/OT  > Disposition pending, SNF v. IPR    Assessment and Plan      # Recurrent falls   # Acute on Chronic back pain with lower extremities weakness and physical decondition   # Hx of T12 iliac decompression/fusion on 01/2024  - Patient presented after frequent reported falls and worsening weakness in the lower extremities associated with numbness and tingling.   - Patient has reported two falls over the last couple days   - Patient was recently discharged to rehab for a similar issues   - She uses a walker for mobility and has been experiencing pain since her time in rehab  - Stable vitals with labs notable for mild LFT  elevation   - UA with 2-10 WBC leukocytes (Recently treated for UTI).    - MRI T spine Long segment spinal posterior instrumentation spanning T12 through the sacroiliac joints. Known periprosthetic fracture of T12 and pedicle screw loosening are better evaluated on prior CT abdomen and pelvis. 2.  Similar scalloped inferior endplate height loss of T11 with mild marrow edema of the T11 vertebral body and fluid within the T12 fracture plane again likely represent benign edema secondary to compression injuries, junctional mechanical factors, and reactive osteitis. Superinfection is again considered much less likely, particularly given absent secondary paraspinous or epidural inflammatory findings (evaluation somewhat limited by noncontrast imaging). 3.  Residual thoracic and lumbar spondylosis as described, without definite high-grade spinal stenosis.   - ESR 26, CRP 0.23  - Foley catheter placed 7/26 after CIC of 1300 mL, removed on 7/28  PLAN  > NSGY consulted in the ER, no plan for acute intervention    > Re-engaged on 7/28 due to urinary retention + foley insertion, no changes in management  > Interventional Pain consulted, appreciate recs (signed off)   > No indication for axial injection at this time   > Likely facet-mediated pain, consider lumbar RFA as outpatient  > Neuro checks every 12 hours, delirium precautions  > PT/OT consulted, following  > Pain Regimen:   > Continue PTA  Lyrica  TID, Lidocaine  patch, Voltaren  gel, Tylenol  PRN, Oxycodone  PRN, Robaxin  TID PRN      # HLD  # Chronic heart failure with preserved EF  # OSA on BIPAP  PLAN  > Hold Crestor   > Hold Bumex  and Spirolactone  > Continue PTA Aspirin   > Continue PTA BIPAP (auto titration, no oxygen bleed)       # IDDM Type 2  PLAN  > Lantus  40 units twice daily  > Aspart 15 units 3 times daily before meals  > LDCF, monitor POC glucose       # RLS  # Depression  # 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   PLAN  > Continue PTA Effexor , Remeron   > Continue PTA Mirapex  RLS       # Rheumatoid arthritis   PLAN  > Hold PTA methotrexate    > Continue PTA Folic acid , Prednisone  5 mg daily  > Continue PTA Celebrex  daily        # Hypothyroidism   PLAN  > Continue PTA synthroid       # GERD   PLAN  > Continue PTA H2 Blocker      FEN:  > DIET DIABETIC (STANDARD) CONSISTENT CARB  > IVF: None      Prophylaxis Review:  VTE ppx: Lovenox  40 mg  GI ppx: H2 blocker  Bowel Regimen: Miralax  1 pkt BID, Senna PRN, Milk of Mag as needed  Last bowel movement: 07/21/24      Code Status:  Full Code  Disposition: Home, pending    Seen and discussed with Dr. Eleanor LITTIE Matters, MD     Mitchael Edge, DO  Preliminary Medicine - PGY1  Pager [phone]  [facility]      Subjective:     Patient seen this morning sitting in chair.  States that she has some mild back pain, but tolerable with current pain regimen.  Motivated to continue working with PT/OT.  Denies urinary and bowel incontinence.  Robaxin  is helping with her muscle spasms.  Briefly discussed disposition.  No acute concerns.    Objective:  BP: (111-169)/(58-100)   Temp:  [36.5 ?C (97.7 ?F)-36.8 ?C (98.3 ?F)]   Pulse:  [62-83]   Respirations:  [16 PER MINUTE-18 PER MINUTE]   SpO2:  [96 %-99 %]   O2 Device: None (Room air)  O2 Liter Flow: 2 Lpm    Physical Exam  Constitutional:       Appearance: Normal appearance.   HENT:      Head: Normocephalic and atraumatic.   Eyes:      Extraocular Movements: Extraocular movements intact.      Pupils: Pupils are equal, round, and reactive to light.   Cardiovascular:      Rate and Rhythm: Normal rate and regular rhythm.   Pulmonary:      Effort: Pulmonary effort is normal.      Breath sounds: Normal breath sounds.   Abdominal:      Palpations: Abdomen is soft.   Skin:     General: Skin is warm and dry.   Neurological:      General: No focal deficit present.      Mental Status: She is oriented to person, place, and time. She is lethargic.      Sensory: Sensation is intact.      Motor: Weakness (BLE HF 4/5, L>R) present.   Psychiatric:         Mood and Affect: Mood and affect normal.         Behavior: Behavior normal. Behavior is cooperative.            Intake/Output Summary (Last 24 hours) at 07/21/2024 1255  Last data filed at 07/21/2024 1200  Gross per 24 hour   Intake 1080 ml   Output 1400 ml   Net -320 ml      Recent Labs     07/19/24  0333 07/20/24  0334 07/20/24  0912   HGB 11.4* 11.0*  --    WBC 10.30 6.20  --    PLTCT 184 180  --    NA 138 141  --    K 4.3 4.1  --    CL 101 102  --    CO2 29 32*  --    BUN 19 18  --    CR 0.48 0.52  --    GLU 167* 163*  --    CA 9.1 9.3  --    MG 2.0 2.0 1.9   ALBUMIN 3.5 3.4*  --    AST 29 21  --    ALT 49 37  --    ALKPHOS 115* 108  --    TOTBILI 0.3 0.2  --        No results for input(s): BNP, HIGHSTROPI, HSTROP0HR, HSTROP2HR, HSTROPDELTA in the last 72 hours.     No image results found.     Meds:  Scheduled Meds:aspirin  chewable tablet 81 mg, 81 mg, Oral, QDAY  celecoxib  (CeleBREX ) capsule 200 mg, 200 mg, Oral, QDAY  diclofenac  sodium (VOLTAREN ) 1 % topical gel 4 g, 4 g, Topical, QID  enoxaparin  (LOVENOX ) syringe 40 mg, 40 mg, Subcutaneous, QDAY(21)  famotidine  (PEPCID ) tablet 40 mg, 40 mg, Oral, QDAY  folic acid  (FOLVITE ) tablet 4 mg, 4 mg, Oral, QDAY  insulin  aspart (U-100) (NOVOLOG  FLEXPEN U-100 INSULIN ) injection PEN 0-6 Units, 0-6 Units, Subcutaneous, ACHS (22)  insulin  aspart (U-100) (NOVOLOG  FLEXPEN U-100 INSULIN ) injection PEN 15 Units, 15 Units, Subcutaneous, TID before meals  insulin  glargine (LANTUS  SOLOSTAR U-100 INSULIN ) injection PEN 40 Units, 40 Units, Subcutaneous, BID  levothyroxine  (SYNTHROID ) tablet 88 mcg, 88 mcg, Oral, QDAY  lidocaine  (LIDODERM ) 5 % topical patch 2 patch, 2 patch, Topical, QDAY  mirtazapine  (REMERON ) tablet 15 mg, 15 mg, Oral, QHS  pramipexole  (MIRAPEX ) tablet 0.75 mg, 0.75 mg, Oral, QDAY  predniSONE  (DELTASONE ) tablet 5 mg, 5 mg, Oral, QDAY  pregabalin  (LYRICA ) capsule 75 mg, 75 mg, Oral, Q8H*  rosuvastatin  (CRESTOR ) tablet 20 mg, 20 mg, Oral, QHS  venlafaxine  XR (EFFEXOR  XR) capsule 225 mg, 225 mg, Oral, QDAY    Continuous Infusions:  PRN and Respiratory Meds:acetaminophen  Q4H PRN, albuterol  sulfate Q6H PRN, artificial tears multi dose PRN, bisacodyL  QDAY PRN, dextrose  50% (D50) IV PRN, melatonin QHS PRN, methocarbamoL  TID PRN, ondansetron  Q6H PRN **OR** ondansetron  (ZOFRAN ) IV Q6H PRN, oxyCODONE  Q6H PRN, polyethylene glycol 3350  BID PRN, sennosides-docusate sodium  QDAY PRN      Wounds: Nutrition:  Malnutrition Details:                                        Intake/Output Summary:  (Last 24 hours)    Intake/Output Summary (Last 24 hours) at 07/21/2024 1255  Last data filed at 07/21/2024 1200  Gross per 24 hour   Intake 1080 ml   Output 1400 ml   Net -320 ml     Stool Occurrence: 1           Radiology/Other Diagnostic Tests:    MRI T-Spine/L-Spine 07/17/24    IMPRESSION   1. Long segment spinal posterior instrumentation spanning T12 through the   sacroiliac joints. Known periprosthetic fracture of T12 and pedicle screw   loosening are better evaluated on prior CT abdomen and pelvis.   2.  Similar scalloped inferior endplate height loss of T11 with mild   marrow edema of the T11 vertebral body and fluid within the T12 fracture   plane again likely represent benign edema secondary to compression   injuries, junctional mechanical factors, and reactive osteitis.   Superinfection is again considered much less likely, particularly given   absent secondary paraspinous or epidural inflammatory findings (evaluation   somewhat limited by noncontrast imaging).   3.  Residual thoracic and lumbar spondylosis as described, without   definite high-grade spinal stenosis. Evaluation of the lumbar spinal canal   is limited due to hardware artifact.

## 2024-07-21 NOTE — Progress Notes
 Day 0700-1900    Pain: Pain is controlled with current analgesics. Medications being used: Acetaminophen , oxycodone  PO, and Methocarbamol     Nutrition: Diet- Diet: Diabetic    Activity: Ambulation Episodes: 2 walks during shift and Up in chair    Incentive Spirometer this shift: No  Max Volume: n/a    Acute Events, Nursing Intervention or Provider Communication: n/a    Patient Interventions and Education   Fall Risk/JHFRAT Interventions and Education: (Charting when applicable)   Elimination Interventions: Bed pan available in room   Medications: Use of gait belt , Stay within arm's reach during toileting/showering (i.e., dizziness, orthostasis) , Educate patient on medication side effects, and Bed/chair alarm (i.e., change in mental status)    Patient Care Equipment: Needs assistance with patient care equipment when ambulating and Ensure environment is free of clutter and walkways are clear from tripping hazards   Mobility: Assist x1   Cognition: Bed/Chair Alarm  and Stay within arm's reach while patient ambulating/toileting/showering   Risk for Moderate/Major Injury: Age: >65 yrs and Active Anticoagulation     Restraints: No  Restraints Goal: choice: N/A  See Docflowsheet for restraint documentation, interventions, education, etc.    Intake and Output:      Date 07/20/24 0701 - 07/21/24 0700 07/21/24 0701 - 07/22/24 0700   Shift 0701-1900 1901-0700 24 Hour Total 0701-1900 1901-0700 24 Hour Total   INTAKE   P.O. 1080 480 1560 957  957   Shift Total(mL/kg) 1080(13.2) 480(5.9) 1560(19) 957(11.7)  957(11.7)   OUTPUT   Urine(mL/kg/hr) 300(0.3) 500(0.5) 800(0.4) 1400  1400     Urine 300 657 127 1008  1400   Other           Stool Occurrence 1 x  1 x 2 x  2 x     Urine Occurrence  1 x  1 x 2 x  2 x   Shift Total(mL/kg) 300(3.7) 500(6.1) 800(9.8) 1400(17.1)  1400(17.1)   NET 780 -20 760 -443  -443   Weight (kg) 82 82 82 82 82 82       Patient Belongings observed in room during this RN's shift: Cell Phone    Discharge Plan: Discharge Planning: Patient has a ride

## 2024-07-21 NOTE — Progress Notes
 OCCUPATIONAL THERAPY  PROGRESS NOTE      Name: Hailey Rodgers   MRN: [id number]     DOB: [birth year]      Age: 73 y.o.  Admission Date: 07/16/2024     LOS: 3 days     Date of Service: 07/21/2024      Mobility  Mobility Level [facility] Highest Level of Mobility (JH-HLM): Walk 25 feet or more (to doorway/hallway)  Distance Walked (feet): 200 ft  Level of Assistance: Assist X1  Assistive Device: Walker  Activity Limited By: Pain;Weakness    Subjective  Significant Hospital Events: PMHx of rheumatoid arthritis on chronic predniosone, OSA on BIPAP, chronic heart failure with preserved EF, CAD , HTN, dyslipidemia, IDDM type 2, history of urinary retentions , history of b T12-iliac decompression and fusion ~ 01/2024 presented to [place] ED on 06/23/24 for fever along with acute on chronic back pain, diarrhea, and foul urine/incontinence. Patient was recently discharged from inpatient rehab where she was recovering from spinal surgery. She fell twice after discharge and had urinary incontinence since the fall.  Mental / Cognitive: Alert;Oriented;Follows commands  Pain: Complains of pain;Does not rate pain  Pain Location: Back  Pain Interventions: Nurse provides pain medications;Treatment altered to patient's pain tolerance;Patient assisted into position of comfort  Persons Present: Nursing Staff    Home Living Situation  Lives With: Spouse/significant other  Comments: Per patient, spouse not in good health after having a brain bleed, about 3 years ago.  Type of Home: Mobile home  Entry Stairs: Ramp  In-Home Stairs: No stairs  Bathroom Setup: Not accessible  Patient Owned Equipment: Cane: Single point;Walker: Rollator;Hospital bed;Commode: Standard  Comments: Patient says she is unable to use walker inside her trailer due to clutter so she uses a cane.    Prior Level of Function  Level Of Independence: Independent with ADL and community mobility with endurance limitations;Independent with ADL and household mobility with device  Comments: Patient reports she uses a RW in the community but is unable to use RW in house due to size. Uses SPC in house instead  History of Falls in Past 3 Months: Yes  Comments: 4 falls - reports he legs are weak  Comments: Recently discharged from rehab    ADL's  LE Dressing Assist: Maximum Assist  LE Dressing Deficits: Don/Doff R Sock;Don/Doff L Sock  Comment: asks therapist to don socks due to pain    ADL Mobility  Bed Mobility: Supine to Sit: Standby assist  Bed Mobility: Sit to Supine: Standby assist  Transfer Type: Sit to/from stand  Transfer: Assistance Level: To/from;Bed;Minimal assist  Transfer: Assistive Device: Agricultural consultant: Type of Assistance: Elevated bed;For safety considerations  End of Activity Status: In bed;Instructed patient to request assist with mobility  Gait Distance: 200 feet  Gait: Assistance Level: Minimal assist  Gait: Assistive Device: Roller walker    Activity Tolerance  Endurance: 3/5 Tolerates 25-30 Minutes Exercise w/Multiple Rests    Assessment  Assessment: Decreased ADL Status;Decreased Endurance;Decreased High-Level ADLs  Prognosis: Good;w/Cont OT s/p Acute Discharge  Goal Formulation: Patient  Comments: Patient is below baseline and requiring assistance for ADL and mobility 2/2 pain and LE weakness. Will continue to benefit from skilled OT to improve independence with ADLS.    AM-PAC 6 Clicks Daily Activity Inpatient  Putting on and taking off regular lower body clothes: A Lot  Bathing (Including washing, rinsing, drying): A Little  Toileting, which includes using toilet, bedpan, or urinal: A Lot  Putting  on and taking off regular upper body clothing: None  Taking care of personal grooming such as brushing teeth: None  Eating meals: None  Daily Activity Raw Score: 19  Standardized (T-scale) Score: 40.22    Plan  OT Frequency: 3-5x/week  OT Plan for Next Visit: progress endurance w walker; sit <> stands; standing ADLs at sink    ADL Goals  Patient Will Perform All ADL's: w/ Modified Independence    Functional Transfer Goals  Pt Will Perform All Functional Transfers: Modified Independent    OT Discharge Recommendations  Recommendation: Inpatient setting        Therapist: Powell Sales, OTR/L  Date: 07/21/2024

## 2024-07-22 LAB — POC GLUCOSE
~~LOC~~ BKR POC GLUCOSE: 219 mg/dL — ABNORMAL HIGH (ref 70–100)
~~LOC~~ BKR POC GLUCOSE: 159 mg/dL — ABNORMAL HIGH (ref 70–100)
~~LOC~~ BKR POC GLUCOSE: 160 mg/dL — ABNORMAL HIGH (ref 70–100)
~~LOC~~ BKR POC GLUCOSE: 79 mg/dL (ref 70–100)

## 2024-07-22 LAB — MAGNESIUM: ~~LOC~~ BKR MAGNESIUM: 2 mg/dL (ref 1.6–2.6)

## 2024-07-22 MED ORDER — PRAMIPEXOLE 0.25 MG PO TAB
.75 mg | Freq: Two times a day (BID) | ORAL | 0 refills | Status: DC
Start: 2024-07-22 — End: 2024-07-23
  Administered 2024-07-22 – 2024-07-23 (×3): 0.75 mg via ORAL

## 2024-07-22 NOTE — Progress Notes
 Day 0700-1900    Pain: Pain is controlled with current analgesics. Medications being used: Acetaminophen     Nutrition: Diet- Diet: Diabetic    Activity: Ambulation Episodes: 2 walks during shift    Incentive Spirometer this shift: No  Max Volume: N/A    Acute Events, Nursing Intervention or Provider Communication: N/A    Patient Interventions and Education   Fall Risk/JHFRAT Interventions and Education: (Charting when applicable)   Elimination Interventions: Bed pan available in room   Medications: Use of gait belt , Stay within arm's reach during toileting/showering (i.e., dizziness, orthostasis) , Educate patient on medication side effects, and Bed/chair alarm (i.e., change in mental status)    Patient Care Equipment: Ensure environment is free of clutter and walkways are clear from tripping hazards   Mobility: Assist x1, Gait belt in use when ambulating, and Utilize walker, cane, or additional walking aid for ambulation   Cognition: Bed/Chair Alarm  and Stay within arm's reach while patient ambulating/toileting/showering   Risk for Moderate/Major Injury: Age: >65 yrs and Active Anticoagulation     Restraints: No  Restraints Goal: choice: N/A  See Docflowsheet for restraint documentation, interventions, education, etc.    Intake and Output:      Date 07/21/24 0701 - 07/22/24 0700 07/22/24 0701 - 07/23/24 0700   Shift 0701-1900 1901-0700 24 Hour Total 0701-1900 1901-0700 24 Hour Total   INTAKE   P.O. 365 612 7928 1217  1217   Shift Total(mL/kg) 957(11.7) 360(4.4) 1317(16.1) 1217(14.8)  1217(14.8)   OUTPUT   Urine(mL/kg/hr) 1400(1.4)  1400(0.7) 1000  1000     Urine 1400  1400 1000  1000   Other           Stool Occurrence 2 x 0 x 2 x 0 x  0 x     Urine Occurrence  2 x 1 x 3 x 0 x  0 x   Shift Total(mL/kg) 1400(17.1)  1400(17.1) 1000(12.2)  1000(12.2)   NET -443 360 -83 217  217   Weight (kg) 82 82 82 82 82 82         This RN provided education to patient. The following education topics were reviewed with patient: DVT Prevention and Importance of Ambulation Patient Verbalizes Understanding and Some Evidence of Learning, Needs Reinforcement     Patient Belongings observed in room during this RN's shift: Glasses and Cell Phone    Discharge Plan: Discharge Planning: Patient has a ride\

## 2024-07-22 NOTE — Progress Notes
 Internal Medicine Daily Progress Note      Patient's Name:  Hailey Rodgers MRN: [id number]   Today's Date:  07/22/2024  Admission Date: 07/16/2024  LOS: 4 days    Problem list  Principal Problem:    Recurrent falls  Active Problems:    Generalized anxiety disorder    Frequent falls    Rheumatoid arthritis (CMS-HCC)    Cervical radiculopathy at C8    Hypothyroidism (acquired)    Coronary artery calcification    Chronic heart failure with preserved ejection fraction (CMS-HCC)    Back pain with sciatica    Brief Hospital Course     Hailey Rodgers is a 74 y.o. female with history of rheumatoid arthritis on chronic predniosone,  OSA on BIPAP, chronic heart failure with preserved EF,  CAD , HTN, dyslipidemia, IDDM type 2, history of urinary retentions ,  history of b  T12-iliac decompression and fusion ~ 01/2024  presented to [place] ED on 06/23/24 for fever along with acute on chronic back pain, diarrhea, and foul urine/incontinence.  Patient was recently discharged from inpatient rehab where she was recovering from spinal surgery.  She fell twice after discharge and had urinary incontinence since the fall.  MRI T-spine and L-spine showed known periprosthetic fracture of T12 and pedicle screw loosening.  Also showed scalloped inferior endplate height loss of T11 and mild marrow edema of the T11 vertebral body.  T12 fracture plan likely represented benign edema secondary to compression injuries, junctional mechanical factors, and/or reactive osteitis.  Superinfection was ruled out as a possibility.  Neurosurgery was consulted in the ER, recommended no acute surgical intervention.    She was admitted to the inpatient medicine floor on 7/25.  She presented with worsening weakness in the lower extremities as well as urinary incontinence.  Pain was generally under control with her PTA regimen.  On 7/26, patient was noted to have urinary retention and difficulty with micturition.  She was intermittently cathetered, removing 1300 mL of urine.  Patient experienced some hypotension with her systolics dropping to the 80s, but this was resolved with 500 mL LR bolus.  BP later normalized to systolic 110. Later, a Foley catheter was placed to prevent recurrent CIC's.  Interventional pain management was consulted on 7/27.  They stated there was no indication for axial injections at this time.  Pain is likely facet mediated and she can follow-up outpatient for lumbar RFA consideration.  Foley catheter was removed on 7/28 after patient was able to void on her own.  Baclofen  was discontinued on 7/29 due to patient's complaint of sedation and hypotension.  Stated that she was on Robaxin  750 mg 3 times daily with good results, added on 7/29.  Additional home PTA medications were added on 7/31 including loratadine , iron  tablets, lactobacillus probiotics, mupirocin  ointment.  PT/OT noted that patient can ambulate 200 feet with minimum assist, but still has lower extremity weakness and fatigability.  Difficulty with lower extremity ADLs.    Interval Updates - Today 07/22/2024:  > Continue working with PT/OT  > Disposition pending, [employer] v. [facility]     Assessment and Plan      # Recurrent falls   # Acute on Chronic back pain with lower extremities weakness and physical decondition   # Hx of T12 iliac decompression/fusion on 01/2024  - Patient presented after frequent reported falls and worsening weakness in the lower extremities associated with numbness and tingling.   - Patient has reported two falls over the  last couple days   - Patient was recently discharged to rehab for a similar issues   - She uses a walker for mobility and has been experiencing pain since her time in rehab  - Stable vitals with labs notable for mild LFT elevation   - UA with 2-10 WBC leukocytes (Recently treated for UTI).    - MRI T spine Long segment spinal posterior instrumentation spanning T12 through the sacroiliac joints. Known periprosthetic fracture of T12 and pedicle screw loosening are better evaluated on prior CT abdomen and pelvis. 2.  Similar scalloped inferior endplate height loss of T11 with mild marrow edema of the T11 vertebral body and fluid within the T12 fracture plane again likely represent benign edema secondary to compression injuries, junctional mechanical factors, and reactive osteitis. Superinfection is again considered much less likely, particularly given absent secondary paraspinous or epidural inflammatory findings (evaluation somewhat limited by noncontrast imaging). 3.  Residual thoracic and lumbar spondylosis as described, without definite high-grade spinal stenosis.   - ESR 26, CRP 0.23  - Foley catheter placed 7/26 after CIC of 1300 mL, removed on 7/28  PLAN  > NSGY consulted in the ER, no plan for acute intervention    > Re-engaged on 7/28 due to urinary retention + foley insertion, no changes in management  > Interventional Pain consulted, appreciate recs (signed off)   > No indication for axial injection at this time   > Likely facet-mediated pain, consider lumbar RFA as outpatient  > Neuro checks every 12 hours, delirium precautions  > PT/OT consulted, following  > Pain Regimen:   > Continue PTA  Lyrica  TID, Lidocaine  patch, Voltaren  gel, Tylenol  PRN, Oxycodone  PRN, Robaxin  TID PRN      # HLD  # Chronic heart failure with preserved EF  # OSA on BIPAP  PLAN  > Hold Crestor   > Hold Bumex  and Spirolactone  > Continue PTA Aspirin   > Continue PTA BIPAP (auto titration, no oxygen bleed)       # IDDM Type 2  PLAN  > Lantus  40 units twice daily  > Aspart 15 units 3 times daily before meals  > LDCF, monitor POC glucose       # RLS  # Depression  # 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   PLAN  > Continue PTA Effexor , Remeron   > Continue PTA Mirapex  RLS, changed from daily to BID 7/31       # Rheumatoid arthritis   PLAN  > Hold PTA methotrexate    > Continue PTA Folic acid , Prednisone  5 mg daily  > Continue PTA Celebrex  daily # Hypothyroidism   PLAN  > Continue PTA synthroid       # GERD   PLAN  > Continue PTA H2 Blocker    #Misc PTA meds: Tums, Loratadine , Iron  tabs, lactobacillus probiotics, mupirocin  ointment      FEN:  > DIET DIABETIC (STANDARD) CONSISTENT CARB  > IVF: None      Prophylaxis Review:  VTE ppx: Lovenox  40 mg  GI ppx: H2 blocker  Bowel Regimen: Miralax  1 pkt BID, Senna PRN, Milk of Mag as needed  Last bowel movement: 07/21/24      Code Status:  Full Code  Disposition: Home, pending    Seen and discussed with Dr. Eleanor LITTIE Matters, MD     Mitchael Edge, DO  Preliminary Medicine - PGY1  Pager [phone]  [facility]      Subjective:  No overnight events.  Patient sleeping this morning, states she has low blood sugar before breakfast.  She had lower back pain yesterday when working with PT/OT, now improved.  Bowel and bladder moving okay. Still has lower extremity weakness but motivated to strengthen her muscles.  No acute concerns.    Objective:       BP: (98-147)/(46-73)   Temp:  [36.6 ?C (97.8 ?F)-36.7 ?C (98.1 ?F)]   Pulse:  [61-92]   Respirations:  [16 PER MINUTE-18 PER MINUTE]   SpO2:  [95 %-100 %]   O2 Device: None (Room air)  O2 Liter Flow: 2 Lpm    Physical Exam  Constitutional:       Appearance: Normal appearance.   HENT:      Head: Normocephalic and atraumatic.   Eyes:      Extraocular Movements: Extraocular movements intact.      Pupils: Pupils are equal, round, and reactive to light.   Cardiovascular:      Rate and Rhythm: Normal rate and regular rhythm.   Pulmonary:      Effort: Pulmonary effort is normal.      Breath sounds: Normal breath sounds.   Abdominal:      Palpations: Abdomen is soft.   Skin:     General: Skin is warm and dry.   Neurological:      General: No focal deficit present.      Mental Status: She is oriented to person, place, and time. She is lethargic.      Sensory: Sensation is intact.      Motor: Weakness (BLE HF 4/5, L>R, improved) present.   Psychiatric: Mood and Affect: Mood and affect normal.         Behavior: Behavior normal. Behavior is cooperative.            Intake/Output Summary (Last 24 hours) at 07/22/2024 1148  Last data filed at 07/22/2024 1000  Gross per 24 hour   Intake 1074 ml   Output 1500 ml   Net -426 ml      Recent Labs     07/20/24  0334 07/20/24  0912 07/22/24  0334 07/22/24  0820   HGB 11.0*  --  11.0*  --    WBC 6.20  --  6.40  --    PLTCT 180  --  231  --    NA 141  --  138  --    K 4.1  --  4.1  --    CL 102  --  102  --    CO2 32*  --  28  --    BUN 18  --  21  --    CR 0.52  --  0.47  --    GLU 163*  --  173*  --    CA 9.3  --  9.3  --    MG 2.0 1.9  --  2.0   ALBUMIN 3.4*  --  3.6  --    AST 21  --  21  --    ALT 37  --  32  --    ALKPHOS 108  --  114*  --    TOTBILI 0.2  --  0.2  --        No results for input(s): BNP, HIGHSTROPI, HSTROP0HR, HSTROP2HR, HSTROPDELTA in the last 72 hours.     No image results found.     Meds:  Scheduled Meds:aspirin  chewable tablet 81 mg, 81 mg, Oral,  QDAY  celecoxib  (CeleBREX ) capsule 200 mg, 200 mg, Oral, QDAY  diclofenac  sodium (VOLTAREN ) 1 % topical gel 4 g, 4 g, Topical, QID  enoxaparin  (LOVENOX ) syringe 40 mg, 40 mg, Subcutaneous, QDAY(21)  famotidine  (PEPCID ) tablet 40 mg, 40 mg, Oral, QDAY  [START ON 07/23/2024] ferrous sulfate  (FEOSOL) tablet 325 mg, 325 mg, Oral, Once per day on Monday Wednesday Friday  folic acid  (FOLVITE ) tablet 4 mg, 4 mg, Oral, QDAY  insulin  aspart (U-100) (NOVOLOG  FLEXPEN U-100 INSULIN ) injection PEN 0-6 Units, 0-6 Units, Subcutaneous, ACHS (22)  insulin  aspart (U-100) (NOVOLOG  FLEXPEN U-100 INSULIN ) injection PEN 15 Units, 15 Units, Subcutaneous, TID before meals  insulin  glargine (LANTUS  SOLOSTAR U-100 INSULIN ) injection PEN 40 Units, 40 Units, Subcutaneous, BID  lactobacillus rhamnosus GG (CULTURELLE) 15 billion cell capsule 1 capsule, 1 capsule, Oral, BID w/meals  levothyroxine  (SYNTHROID ) tablet 88 mcg, 88 mcg, Oral, QDAY  lidocaine  (LIDODERM ) 5 % topical patch 2 patch, 2 patch, Topical, QDAY  loratadine  (CLARITIN ) tablet 10 mg, 10 mg, Oral, QDAY  mirtazapine  (REMERON ) tablet 15 mg, 15 mg, Oral, QHS  multivitamin (ONE-A-DAY) tablet 1 tablet, 1 tablet, Oral, QDAY  pramipexole  (MIRAPEX ) tablet 0.75 mg, 0.75 mg, Oral, BID (See eMAR)  predniSONE  (DELTASONE ) tablet 5 mg, 5 mg, Oral, QDAY  pregabalin  (LYRICA ) capsule 75 mg, 75 mg, Oral, Q8H*  rosuvastatin  (CRESTOR ) tablet 20 mg, 20 mg, Oral, QHS  venlafaxine  XR (EFFEXOR  XR) capsule 225 mg, 225 mg, Oral, QDAY    Continuous Infusions:  PRN and Respiratory Meds:acetaminophen  Q4H PRN, albuterol  sulfate Q6H PRN, artificial tears multi dose PRN, bisacodyL  QDAY PRN, calcium  carbonate Q4H PRN, dextrose  50% (D50) IV PRN, melatonin QHS PRN, methocarbamoL  TID PRN, mupirocin  BID PRN, ondansetron  Q6H PRN **OR** ondansetron  (ZOFRAN ) IV Q6H PRN, oxyCODONE  Q6H PRN, polyethylene glycol 3350  BID PRN, sennosides-docusate sodium  QDAY PRN      Wounds:        Nutrition:  Malnutrition Details:                                        Intake/Output Summary:  (Last 24 hours)    Intake/Output Summary (Last 24 hours) at 07/22/2024 1148  Last data filed at 07/22/2024 1000  Gross per 24 hour   Intake 1074 ml   Output 1500 ml   Net -426 ml     Stool Occurrence: 0           Radiology/Other Diagnostic Tests:    MRI T-Spine/L-Spine 07/17/24    IMPRESSION   1. Long segment spinal posterior instrumentation spanning T12 through the   sacroiliac joints. Known periprosthetic fracture of T12 and pedicle screw   loosening are better evaluated on prior CT abdomen and pelvis.   2.  Similar scalloped inferior endplate height loss of T11 with mild   marrow edema of the T11 vertebral body and fluid within the T12 fracture   plane again likely represent benign edema secondary to compression   injuries, junctional mechanical factors, and reactive osteitis.   Superinfection is again considered much less likely, particularly given   absent secondary paraspinous or epidural inflammatory findings (evaluation   somewhat limited by noncontrast imaging).   3.  Residual thoracic and lumbar spondylosis as described, without   definite high-grade spinal stenosis. Evaluation of the lumbar spinal canal   is limited due to hardware artifact.

## 2024-07-22 NOTE — Consults
 [facility] Consult Note      Admit Date: 07/16/2024                                                  Chief Complaint/Reason for Consult:  Right hallux pain    Assessment/Plan:  Hailey Rodgers is a 73 y.o. female with a PMHx of T12-pelvis fusion and decompression, diabetes, consulted to service for bilateral hallux onychocryptosis. Clinically, incurvated nail to bilateral hallux without signs of acute bacterial infection. No open wounds or lesions. After antiseptic cleansing, nails x10 were sharply debrided with reduction in length and thickness without incident. This rendered the patient asymptomatic with applied pressure to the nail plate.        Hailey Rodgers, DPM  Ortho Podiatry  Available via Voalte  ______________________________________________________________________    History of Present Illness: Hailey Rodgers is a 73 y.o. female with a past medical history significant for T12-pelvis fusion and decompression consulted to service for right hallux pain due to ingrowing nail. Patient unable to perform home self nail care. Reports pain to the medial nail border, exacerbated by direct pressure.     Review of Systems:  A complete review of systems was obtained and was negative except for the symptoms reviewed above.     Amoxicillin-pot clavulanate, Gemfibrozil, Morphine , Trazodone, Adhesive tape (rosins), Atorvastatin, Latex, Bupropion, Cymbalta [duloxetine], and Levaquin [levofloxacin]    Past Medical History:    Age-related physical debility    Allergic rhinitis    Arthritis    Asthma    Bronchitis    Cancer (CMS-HCC)    Cervical stenosis of spinal canal    Convulsion (CMS-HCC)    Coronary artery disease    COVID-19 virus infection    Disorganized thinking    Diverticulosis    DM (diabetes mellitus) (CMS-HCC)    Dyslipidemia    Dyspnea    Fatty liver disease, nonalcoholic    Gastroesophageal reflux disease without esophagitis    GERD (gastroesophageal reflux disease)    Hypertriglyceridemia Hypothyroidism    Hypothyroidism due to acquired atrophy of thyroid    Infection    Leg weakness    Lumbar stenosis    Lung disease    Lung nodule    Memory loss    Movement disorder    Narcolepsy    Obesity    On supplemental oxygen therapy    OSA (obstructive sleep apnea)    Other dysphagia    Other hyperlipidemia    Peripheral neuropathy    Polypharmacy    Postmenopausal    Pseudoseizures    Recurrent major depressive disorder, in partial remission    Rhabdomyolysis    Rheumatoid arthritis (CMS-HCC)    Seizures (CMS-HCC)    Sleep disorder    Syncope and collapse    Type 2 diabetes mellitus with hyperglycemia, with long-term current use of insulin  (CMS-HCC)    Unspecified deficiency anemia    Urinary incontinence    Urinary problem    Vision decreased       Surgical History:   Procedure Laterality Date    HX ADENOIDECTOMY  1962    HX TONSILLECTOMY  1962    TUBAL LIGATION  1977    RECTAL SURGERY  1979    for rectal prolapse; bladder repair    CHOLECYSTECTOMY  1984    HYSTERECTOMY  2000    BREAST BIOPSY Right 2012    COLONOSCOPY  2016    CERVICAL 3 TO THORACIC 7 DECOMPRESSION AND FUSION Bilateral 07/18/2015    Performed by Vivica Domino, MD at [facility] OR    BLEPHAROPLASTY      CARDIOVASCULAR STRESS TEST      CERVIX SURGERY  2016    ECHOCARDIOGRAM PROCEDURE  2022    ELECTROCARDIOGRAM      EVENT MONITOR      Have Loop monitor    HERNIA REPAIR  1984/2007    SEPTOPLASTY  1986/2013    TRANSESOPHAGEAL ECHO      UPPER GASTROINTESTINAL ENDOSCOPY         Family History   Problem Relation Name Age of Onset    Cancer Mother [name]     Thyroid Disease Mother [name]     Hypertension Mother [name]     Cataract Mother [name]     Cancer Father RL         pancreatic    Cancer Other [name]     Stroke Paternal [name]     Cancer Paternal Aunt      Coronary Artery Disease Paternal Grandmother      Diabetes Daughter [name]     Migraines Daughter [name]     Strabismus Neg Hx      Retinal Detachment Neg Hx Neurologic Disorder Neg Hx      Macular Degen Neg Hx      Glaucoma Neg Hx      Blindness Neg Hx      Amblyopia Neg Hx      Autoimmune Disease Neg Hx         Outpatient Medications as of 07/22/2024   Medication Sig Dispense Refill    acetaminophen  (TYLENOL  EXTRA STRENGTH) 500 mg tablet Take two tablets by mouth every 6 hours as needed. Max of 4,000 mg of acetaminophen  in 24 hours.  Indications: pain 90 tablet 0    albuterol  sulfate (PROAIR  HFA) 90 mcg/actuation HFA aerosol inhaler Inhale two puffs by mouth into the lungs every 6 hours as needed for Wheezing for 5 days. 6.7 g 0    aspirin  81 mg chewable tablet Chew one tablet by mouth daily. 5 tablet 0    baclofen  (LIORESAL ) 10 mg tablet Take one tablet by mouth three times daily as needed for Muscle Cramps or Pain. Indications: muscle spasms caused by a spinal disease 90 tablet 1    bisacodyL  (DULCOLAX (BISACODYL )) 10 mg rectal suppository Insert or Apply one suppository to rectal area as directed daily as needed. 5 suppository 0    blood pressure test kit-Large kit with cuff Take blood pressure as directed by physician.  Indications: CHF 1 kit 0    calcium  carbonate (CALTRATE 600) 600 mg calcium  (1,500 mg) tablet Take two tablets by mouth twice daily. 60 tablet 0    celecoxib  (CELEBREX ) 200 mg capsule Take one capsule by mouth daily. Indications: rheumatoid arthritis      CHOLEcalciferoL  (vitamin D3) (DIALYVITE VITAMIN D ) 125 mcg (5,000 unit) capsule Take one capsule by mouth every Sunday. 1 capsule 0    diclofenac  sodium (VOLTAREN  ARTHRITIS PAIN) 1 % topical gel Apply 2.25 in topically to affected area four times daily for 30 days 100 g 0    diclofenac  sodium (VOLTAREN ) 1 % topical gel Apply 2 g topically 4 (four) times a day as needed. 50 g 0    dicyclomine  (BENTYL ) 20 mg tablet Take one tablet by mouth every  6-8 hours as needed for pain. 20 tablet 0    digital body weight scale Weigh daily in the morning.  Indications: CHF 1 each 0    docusate (COLACE) 100 mg capsule Take two capsules by mouth twice daily.      dulaglutide  (TRULICITY ) 1.5 mg/0.5 mL injection pen Inject 1.5 mg under the skin every 7 (seven) days. 2 mL 0    famotidine  (PEPCID ) 40 mg tablet Take one tablet by mouth daily. 30 tablet 0    ferrous sulfate  (IRON ) 325 mg (65 mg iron ) tablet Take one tablet by mouth three times weekly. Take on an empty stomach at least 1 hour before or 2 hours after food. Take on Monday, Wednesday and Fridays  Indications: anemia from inadequate iron       fexofenadine (ALLEGRA) 180 mg tablet Take one tablet by mouth daily. 5 tablet 0    fluconazole  (DIFLUCAN ) 100 mg tablet Take one tablet by mouth daily 3 tablet 0    fluticasone  propionate (FLONASE  ALLERGY RELIEF) 50 mcg/actuation nasal spray, suspension Apply one spray to each nostril as directed twice daily for 5 days. 16 g 0    folic acid  (FOLVITE ) 1 mg tablet Take four tablets by mouth daily. 360 tablet 3    hydrocortisone  acetate (ANUSOL -HC) 25 mg rectal suppository Insert or Apply one suppository to rectal area as directed twice daily. 10 suppository 0    insulin  aspart (U-100) (NOVOLOG  FLEXPEN U-100 INSULIN ) 100 unit/mL (3 mL) PEN Inject fifteen Units under the skin three times daily before meals. Indications: type 2 diabetes mellitus      insulin  glargine (LANTUS  SOLOSTAR U-100 INSULIN ) 100 unit/mL (3 mL) subcutaneous PEN Inject forty Units under the skin twice daily. 15 mL 5    insulin  lispro (U-100) (HUMALOG  KWIKPEN INSULIN ) 100 unit/mL subcutaneous PEN Inject 30 units under the skin with meals 3 times daily plus correction factor of 1 on a sliding scale as directed. 15 mL 10    lactobacillus rhamnosus GG (CULTURELLE) 15 billion cell capsule Take one capsule by mouth as directed daily.      LANTUS  SOLOSTAR U-100 INSULIN  100 unit/mL (3 mL) subcutaneous PEN Inject fifteen Units under the skin every morning AND fifteen Units at bedtime daily. Indications: type 2 diabetes mellitus      levothyroxine  (SYNTHROID ) 88 mcg tablet Take one tablet by mouth daily. 5 tablet 0    lidocaine  (LIDODERM ) 5 % topical patch Apply one patch topically to affected area daily. Apply patch for 12 hours, then remove for 12 hours before repeating. 30 patch 0    lidocaine  (LIDODERM ) 5 % topical patch Place 1 patch on the skin daily. Remove & Discard patch within 12 hours or as directed by MD 30 patch 0    methotrexate  sodium 2.5 mg tablet Take ten tablets by mouth every Monday. 10 tablet 0    milk of magnesium  (MILK OF MAGNESIA) 400 mg/5 mL oral suspension Take 30 mL by mouth daily as needed for constipation. 360 mL 0    mirtazapine  (REMERON ) 15 mg tablet Take 1 tablet (15 mg total) by mouth nightly. 30 tablet 2    mirtazapine  7.5 mg tablet Take two tablets by mouth at bedtime daily. Indications: major depressive disorder      multivitamin with folic acid  (TAB-A-VITE) 400 mcg tab Take one tablet by mouth daily. 100 tablet 0    mupirocin  (BACTROBAN ) 2 % topical ointment Apply 1 application in the nostrils twice a day 30 g 6  naloxone  (NARCAN ) 4 mg/actuation nasal spray Give 1 spray in one nostril if opioid overdose suspected. If no response in 3 minutes, repeat dose in other nostril with 2nd spray device. 2 each 1    nifedipine  0.3%/lidocaine  1.5%/white petrolatum (#) Apply to hemorrhoids QID PRN  Indications: hemorrhoid irriation      ondansetron  (ZOFRAN  ODT) 8 mg rapid dissolve tablet Dissolve one tablet by mouth every 8 hours as needed.      oxyCODONE  (ROXICODONE ) 5 mg tablet Take one tablet by mouth every 6 hours as needed for pain. 30 tablet 0    oxyCODONE  (ROXICODONE ) 5 mg tablet Take one tablet by mouth every 6 hours as needed for Pain. Indications: pain      polyethylene glycol 3350  (MIRALAX ) 17 g packet Take one packet by mouth twice daily as needed. 12 each     pramipexole  (MIRAPEX ) 0.75 mg tablet Take one tablet by mouth daily. 90 tablet 3    predniSONE  (DELTASONE ) 5 mg tablet Take one tablet by mouth daily. Indications: ra      pregabalin  (LYRICA ) 25 mg capsule Take one capsule by mouth three times daily. 90 capsule 5    pregabalin  (LYRICA ) 50 mg capsule Take one capsule by mouth every 8 hours. 90 capsule 0    rosuvastatin  (CRESTOR ) 20 mg tablet Take one tablet by mouth daily. 90 tablet 3    sennosides-docusate sodium  (SENNA-S) 8.6/50 mg tablet Take one tablet by mouth daily. 30 tablet 0    sodium chloride  (SEA MIST) 0.65 % nasal spray Apply two sprays to each nostril as directed as Needed. Indications: dryness of the nose      TRULICITY  3 mg/0.5 mL injection pen       venlafaxine  XR (EFFEXOR  XR) 75 mg capsule Take 3 capsules (225 mg total) by mouth daily. 90 capsule 2    [Paused] vibegron  (GEMTESA ) 75 mg tablet Take one tablet by mouth at bedtime daily. 30 tablet 3    vitamins, multi w/minerals 9 mg iron -400 mcg tab Take one tablet by mouth daily.           Vital Signs:  Last Filed in 24 hours   BP: 125/62 (07/31 1606)  Temp: 36.7 ?C (98.1 ?F) (07/31 1606)  Pulse: 67 (07/31 1606)  Respirations: 16 PER MINUTE (07/31 1126)  SpO2: 95 % (07/31 1606)  O2 Device: None (Room air) (07/31 1606)  O2 Liter Flow: 2 Lpm (07/31 0752)     Physical Exam:    Constitutional: No apparent distress  Respiratory: Unlabored respirations  Cardiovascular: Regular rate  Skin: Skin is shiny, atrophic with xerotic integument noted to bilateral plantar heels. Bilateral hallux nail incurvated to medial and lateral borders. Remaining nails elongated, thickened, dystrophic with subungual debris.  Vascular: Faintly palpable DP and PT pulses bilaterally. Mild edema to bilateral lower legs.  Musculoskeletal: Periungual tenderness to palpation.   Neurologic: Diminished light touch sensation to superficial and deep peroneal nerve, saphenous, sural and tibial distributions    Lab/Radiology/Other Diagnostic Tests:    CBC w/Diff   Lab Results   Component Value Date/Time    WBC 6.40 07/22/2024 03:34 AM    HGB 11.0 (L) 07/22/2024 03:34 AM    HCT 33.0 (L) 07/22/2024 03:34 AM    PLTCT 231 07/22/2024 03:34 AM         Basic Metabolic Profile   Lab Results   Component Value Date/Time    NA 138 07/22/2024 03:34 AM    K 4.1 07/22/2024 03:34 AM  CL 102 07/22/2024 03:34 AM    CO2 28 07/22/2024 03:34 AM    GAP 8 07/22/2024 03:34 AM    BUN 21 07/22/2024 03:34 AM    CR 0.47 07/22/2024 03:34 AM    GLU 173 (H) 07/22/2024 03:34 AM    HGBA1C 9.1 (H) 06/24/2024 09:27 AM             Lab Results   Component Value Date/Time    ESR 26 07/17/2024 12:16 PM    CRP 0.23 07/17/2024 12:16 PM        Coagulation Studies   Lab Results   Component Value Date/Time    PT 13.4 06/25/2024 05:34 AM    PTT 28.7 06/25/2024 05:34 AM    INR 1.2 06/25/2024 05:34 AM

## 2024-07-22 NOTE — Progress Notes
 I have performed, reperformed, and/or observed the notes, assessments and procedures performed by Adah POUR, RN.  I was personally present on this date of service 07/22/24 and concur with the nursing student's documentation unless otherwise noted.

## 2024-07-23 ENCOUNTER — Inpatient Hospital Stay: Admit: 2024-07-16 | Discharge: 2024-07-16 | Payer: MEDICARE

## 2024-07-23 DIAGNOSIS — K219 Gastro-esophageal reflux disease without esophagitis: Secondary | ICD-10-CM

## 2024-07-23 DIAGNOSIS — G4733 Obstructive sleep apnea (adult) (pediatric): Secondary | ICD-10-CM

## 2024-07-23 DIAGNOSIS — R296 Repeated falls: Secondary | ICD-10-CM

## 2024-07-23 DIAGNOSIS — Z91040 Latex allergy status: Secondary | ICD-10-CM

## 2024-07-23 DIAGNOSIS — Z981 Arthrodesis status: Secondary | ICD-10-CM

## 2024-07-23 DIAGNOSIS — M47816 Spondylosis without myelopathy or radiculopathy, lumbar region: Secondary | ICD-10-CM

## 2024-07-23 DIAGNOSIS — M960 Pseudarthrosis after fusion or arthrodesis: Principal | ICD-10-CM

## 2024-07-23 DIAGNOSIS — G8929 Other chronic pain: Secondary | ICD-10-CM

## 2024-07-23 DIAGNOSIS — Z6828 Body mass index (BMI) 28.0-28.9, adult: Secondary | ICD-10-CM

## 2024-07-23 DIAGNOSIS — Z809 Family history of malignant neoplasm, unspecified: Secondary | ICD-10-CM

## 2024-07-23 DIAGNOSIS — M5430 Sciatica, unspecified side: Secondary | ICD-10-CM

## 2024-07-23 DIAGNOSIS — R569 Unspecified convulsions: Secondary | ICD-10-CM

## 2024-07-23 DIAGNOSIS — Z9181 History of falling: Secondary | ICD-10-CM

## 2024-07-23 DIAGNOSIS — I110 Hypertensive heart disease with heart failure: Secondary | ICD-10-CM

## 2024-07-23 DIAGNOSIS — Z833 Family history of diabetes mellitus: Secondary | ICD-10-CM

## 2024-07-23 DIAGNOSIS — I5032 Chronic diastolic (congestive) heart failure: Secondary | ICD-10-CM

## 2024-07-23 DIAGNOSIS — G2581 Restless legs syndrome: Secondary | ICD-10-CM

## 2024-07-23 DIAGNOSIS — T84216A Breakdown (mechanical) of internal fixation device of vertebrae, initial encounter: Secondary | ICD-10-CM

## 2024-07-23 DIAGNOSIS — Z9049 Acquired absence of other specified parts of digestive tract: Secondary | ICD-10-CM

## 2024-07-23 DIAGNOSIS — E669 Obesity, unspecified: Secondary | ICD-10-CM

## 2024-07-23 DIAGNOSIS — F411 Generalized anxiety disorder: Secondary | ICD-10-CM

## 2024-07-23 DIAGNOSIS — Z8616 Personal history of COVID-19: Secondary | ICD-10-CM

## 2024-07-23 DIAGNOSIS — Z881 Allergy status to other antibiotic agents status: Secondary | ICD-10-CM

## 2024-07-23 DIAGNOSIS — Z8249 Family history of ischemic heart disease and other diseases of the circulatory system: Secondary | ICD-10-CM

## 2024-07-23 DIAGNOSIS — Z823 Family history of stroke: Secondary | ICD-10-CM

## 2024-07-23 DIAGNOSIS — Z90710 Acquired absence of both cervix and uterus: Secondary | ICD-10-CM

## 2024-07-23 DIAGNOSIS — Z880 Allergy status to penicillin: Secondary | ICD-10-CM

## 2024-07-23 DIAGNOSIS — Z7409 Other reduced mobility: Secondary | ICD-10-CM

## 2024-07-23 DIAGNOSIS — E878 Other disorders of electrolyte and fluid balance, not elsewhere classified: Secondary | ICD-10-CM

## 2024-07-23 DIAGNOSIS — E034 Atrophy of thyroid (acquired): Secondary | ICD-10-CM

## 2024-07-23 DIAGNOSIS — E871 Hypo-osmolality and hyponatremia: Secondary | ICD-10-CM

## 2024-07-23 DIAGNOSIS — R339 Retention of urine, unspecified: Secondary | ICD-10-CM

## 2024-07-23 DIAGNOSIS — M5412 Radiculopathy, cervical region: Secondary | ICD-10-CM

## 2024-07-23 DIAGNOSIS — B351 Tinea unguium: Secondary | ICD-10-CM

## 2024-07-23 DIAGNOSIS — I2510 Atherosclerotic heart disease of native coronary artery without angina pectoris: Secondary | ICD-10-CM

## 2024-07-23 DIAGNOSIS — Z7982 Long term (current) use of aspirin: Secondary | ICD-10-CM

## 2024-07-23 DIAGNOSIS — Z794 Long term (current) use of insulin: Secondary | ICD-10-CM

## 2024-07-23 DIAGNOSIS — Z8349 Family history of other endocrine, nutritional and metabolic diseases: Secondary | ICD-10-CM

## 2024-07-23 DIAGNOSIS — E7849 Other hyperlipidemia: Secondary | ICD-10-CM

## 2024-07-23 DIAGNOSIS — Z79890 Hormone replacement therapy (postmenopausal): Secondary | ICD-10-CM

## 2024-07-23 DIAGNOSIS — F32A Depression, unspecified: Secondary | ICD-10-CM

## 2024-07-23 DIAGNOSIS — E1165 Type 2 diabetes mellitus with hyperglycemia: Secondary | ICD-10-CM

## 2024-07-23 DIAGNOSIS — M47814 Spondylosis without myelopathy or radiculopathy, thoracic region: Secondary | ICD-10-CM

## 2024-07-23 DIAGNOSIS — M069 Rheumatoid arthritis, unspecified: Secondary | ICD-10-CM

## 2024-07-23 DIAGNOSIS — R32 Unspecified urinary incontinence: Secondary | ICD-10-CM

## 2024-07-23 LAB — CBC AND DIFF
~~LOC~~ BKR ABSOLUTE BASO COUNT: 0.1 10*3/uL (ref 0.00–0.20)
~~LOC~~ BKR ABSOLUTE EOS COUNT: 0.3 10*3/uL (ref 0.00–0.45)
~~LOC~~ BKR ABSOLUTE MONO COUNT: 0.7 10*3/uL (ref 0.00–0.80)
~~LOC~~ BKR EOSINOPHILS %: 6.5 % — ABNORMAL HIGH (ref 0.0–5.0)
~~LOC~~ BKR MCV: 83 fL — ABNORMAL HIGH (ref 80.0–100.0)
~~LOC~~ BKR PLATELET COUNT: 229 10*3/uL (ref 150–400)
~~LOC~~ BKR WBC COUNT: 5.3 10*3/uL (ref 4.50–11.00)

## 2024-07-23 LAB — ECG 12-LEAD
P AXIS: 11 degrees
P-R INTERVAL: 184 ms
Q-T INTERVAL: 384 ms
QRS DURATION: 70 ms
QTC CALCULATION (BAZETT): 389 ms
R AXIS: -10 degrees
T AXIS: 37 degrees
VENTRICULAR RATE: 62 {beats}/min

## 2024-07-23 LAB — COMPREHENSIVE METABOLIC PANEL
~~LOC~~ BKR ALBUMIN: 3.7 g/dL — ABNORMAL LOW (ref 3.5–5.0)
~~LOC~~ BKR ALK PHOSPHATASE: 99 U/L (ref 25–110)
~~LOC~~ BKR ANION GAP: 6 10*3/uL (ref 3–12)
~~LOC~~ BKR AST: 19 U/L — ABNORMAL HIGH (ref 7–40)
~~LOC~~ BKR BLD UREA NITROGEN: 18 mg/dL (ref 7–25)
~~LOC~~ BKR CALCIUM: 9.5 mg/dL — ABNORMAL HIGH (ref 8.5–10.6)
~~LOC~~ BKR CHLORIDE: 102 mmol/L — ABNORMAL LOW (ref 98–110)
~~LOC~~ BKR CO2: 31 mmol/L — ABNORMAL HIGH (ref 21–30)
~~LOC~~ BKR CREATININE: 0.5 mg/dL (ref 0.40–1.00)
~~LOC~~ BKR GLOMERULAR FILTRATION RATE (GFR): >60 mL/min (ref >60–4.80)
~~LOC~~ BKR POTASSIUM: 4.4 mmol/L — ABNORMAL LOW (ref 3.5–5.1)
~~LOC~~ BKR SODIUM, SERUM: 139 mmol/L (ref 137–147)
~~LOC~~ BKR TOTAL BILIRUBIN: 0.2 mg/dL (ref 0.2–1.3)

## 2024-07-23 LAB — POC POTASSIUM: ~~LOC~~ BKR POC POTASSIUM: 4.2 mmol/L (ref 3.5–5.1)

## 2024-07-23 LAB — POC LACTATE: ~~LOC~~ BKR POC LACTIC ACID: 0.7 mmol/L (ref 0.5–2.0)

## 2024-07-23 LAB — POC BLOOD GAS ARTERIAL
~~LOC~~ BKR POC BASE EXCESS, ART: 5 mmol/L — ABNORMAL LOW (ref 36–45)
~~LOC~~ BKR POC BICARB, ART: 30 mmol/L — ABNORMAL HIGH (ref 21–28)
~~LOC~~ BKR POC CO2, ART: 48 mmHg — ABNORMAL HIGH (ref 35–45)
~~LOC~~ BKR POC O2, ART: 98 mmHg — ABNORMAL LOW (ref 80–100)
~~LOC~~ BKR POC PH, ART: 7.4 (ref 7.35–7.45)

## 2024-07-23 LAB — POC GLUCOSE
~~LOC~~ BKR POC GLUCOSE: 144 mg/dL — ABNORMAL HIGH (ref 70–100)
~~LOC~~ BKR POC GLUCOSE: 141 mg/dL — ABNORMAL HIGH (ref 70–100)
~~LOC~~ BKR POC GLUCOSE: 126 mg/dL — ABNORMAL HIGH (ref 70–100)
~~LOC~~ BKR POC GLUCOSE: 127 mg/dL — ABNORMAL HIGH (ref 70–100)

## 2024-07-23 LAB — POC SODIUM: ~~LOC~~ BKR POC SODIUM: 139 mmol/L (ref 137–147)

## 2024-07-23 MED ORDER — PREGABALIN 75 MG PO CAP
75 mg | ORAL | 0 refills | 30.00000 days | Status: AC
Start: 2024-07-23 — End: ?
  Filled 2024-09-25: qty 90, 30d supply, fill #0

## 2024-07-23 MED ORDER — METHOCARBAMOL 750 MG PO TAB
750 mg | Freq: Three times a day (TID) | ORAL | 0 refills | 10.00000 days | Status: AC | PRN
Start: 2024-07-23 — End: ?
  Filled 2024-09-24: qty 60, 20d supply, fill #0

## 2024-07-23 MED ORDER — LORATADINE 10 MG PO TAB
10 mg | Freq: Every day | ORAL | 0 refills | 28.00000 days | Status: AC
Start: 2024-07-23 — End: ?

## 2024-07-23 NOTE — Case Management (ED)
 Case Management Progress Note    NAME:Hailey Rodgers                          MRN: [id number]              DOB:16-Nov-1951          AGE: 73 y.o.  ADMISSION DATE: 07/16/2024             DAYS ADMITTED: LOS: 5 days      Today's Date: 07/23/2024    PLAN: anticipate dc to [facility] at 1pm via WC transport    Expected Discharge Date: 07/23/2024 6:00 PM  Is Patient Medically Stable: Yes   Are there Barriers to Discharge? no    INTERVENTION/DISPOSITION:  Discharge Planning              Discharge Planning: Inpatient Rehabilitation  Per MED 3 voalte, pt is medically stable for discharge. SW provided update to [facility], who confirmed pt can be accepted today and set up transport for 1pm. SW tasked CMA to print and deliver transfer packet. SW provided update to pt at bedside who is agreeable to discharge plan. SW faxed discharge orders. Bedside nurse to call report to [phone].    Transportation              Does the Patient Need Case Management to Arrange Discharge Transport? (ex: facility, ambulance, wheelchair/stretcher, Medicaid, cab, other): Yes  Type of Transport: Wheelchair [employer]  Will the Patient Use Family Transport?: No  Support              Support: Pt/Family Updates re:POC or DC Plan, Huddle/team update  Info or Referral              Information or Referral to [employer]: No Needs Identified  Positive SDOH Domains and Potential Barriers      Medication Needs              Medication Needs: No Needs Identified                   Financial              Financial: No Needs Identified  Legal              Legal: No Needs Identified  Other              Other/None: No needs identified  Discharge Disposition                       Selected Continued Care - Admitted Since 07/16/2024       [place] Destination Coordination complete.      Service Provider Services Address Phone Fax Patient Preferred    [facility] [address] [place] [address] [phone] --                  Ilah Argyle, LMSW  Social Work Case Manager  Available on [place]  Desk phone: [phone]

## 2024-07-23 NOTE — Discharge Instructions - Pharmacy
 Discharge Summary      Name: Hailey Rodgers  Medical Record Number: [id number]        Account Number:  [id number]  Date Of Birth:  Nov 06, 1951                         Age:  73 y.o.  Admit date:  07/16/2024                     Discharge date: 07/23/2024      Discharge Attending:  Dr. Eleanor Matters  Discharge Summary Completed By: Mitchael Edge, DO    Service: Med 3- 2071    Reason for hospitalization:  Recurrent falls [R29.6]  Bowel/Bladder Incontinence  Lower Back Pain    Primary Discharge Diagnosis:   Same as Above    Hospital Diagnoses:  Hospital Problems        Active Problems    * (Principal) Recurrent falls    Generalized anxiety disorder    Frequent falls    Rheumatoid arthritis (CMS-HCC)    Cervical radiculopathy at C8    Hypothyroidism (acquired)    Coronary artery calcification    Chronic heart failure with preserved ejection fraction (CMS-HCC)    Back pain with sciatica    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)    Pain due to onychomycosis of toenails of both feet    Onychocryptosis     Present on Admission:   Recurrent falls   Cervical radiculopathy at C8   Back pain with sciatica   Chronic heart failure with preserved ejection fraction (CMS-HCC)   Coronary artery calcification   Frequent falls   Generalized anxiety disorder   Hypothyroidism (acquired)   Rheumatoid arthritis (CMS-HCC)        Significant Past Medical History        Age-related physical debility  Allergic rhinitis  Arthritis  Asthma  Bronchitis  Cancer (CMS-HCC)  Cervical stenosis of spinal canal  Convulsion (CMS-HCC)  Coronary artery disease  COVID-19 virus infection      Comment:  06/2020  Disorganized thinking  Diverticulosis  DM (diabetes mellitus) (CMS-HCC)  Dyslipidemia  Dyspnea  Fatty liver disease, nonalcoholic  Gastroesophageal reflux disease without esophagitis  GERD (gastroesophageal reflux disease)  Hypertriglyceridemia  Hypothyroidism  Hypothyroidism due to acquired atrophy of thyroid  Infection Comment:  Colitis-hospitalized, Infection in my Nostrils  Leg weakness  Lumbar stenosis  Lung disease      Comment:  Bronchitis  Lung nodule      Comment:  10/06/2020 5.  Right upper lobe groundglass nodule                measuring 5 mm which may be  infectious or inflammatory.                Multiple additional sub-5 mm nodules  bilaterally, most                lymph nodes or granulomas. Follow-up CT in 12 months                 recommended for further evaluation if clinically                indicated.   Memory loss  Movement disorder  Narcolepsy  Obesity  On supplemental oxygen therapy      Comment:  Only at night - No longer use on Bi-Pap now  OSA (obstructive sleep apnea)  Other dysphagia  Other  hyperlipidemia  Peripheral neuropathy  Polypharmacy  Postmenopausal  Pseudoseizures  Recurrent major depressive disorder, in partial remission  Rhabdomyolysis      Comment:  2021?  Rheumatoid arthritis (CMS-HCC)  Seizures (CMS-HCC)  Sleep disorder      Comment:  Sleep Apnea & Narcolepsy  Syncope and collapse      Comment:  Age 38 years old  Type 2 diabetes mellitus with hyperglycemia, with long-term current   use of insulin  (CMS-HCC)  Unspecified deficiency anemia  Urinary incontinence  Urinary problem      Comment:  urinary frequency  Vision decreased      Comment:  diplopia    Allergies   Amoxicillin-pot clavulanate, Gemfibrozil, Morphine , Trazodone, Adhesive tape (rosins), Atorvastatin, Latex, Bupropion, Cymbalta [duloxetine], and Levaquin [levofloxacin]    Brief Hospital Course   The patient was admitted and the following issues were addressed during this hospitalization: (with pertinent details including admission exam/imaging/labs).      Hailey Rodgers is a 73 y.o. female with history of rheumatoid arthritis on chronic predniosone,  OSA on BIPAP, chronic heart failure with preserved EF,  CAD , HTN, dyslipidemia, IDDM type 2, history of urinary retentions ,  history of b  T12-iliac decompression and fusion ~ 01/2024  presented to [facility] [facility] on 06/23/24 for fever along with acute on chronic back pain, diarrhea, and foul urine/incontinence.  Patient was recently discharged from inpatient rehab where she was recovering from spinal surgery.  She fell twice after discharge and had occasional urinary incontinence since the fall.  MRI T-spine and L-spine on 07/17/24 showed known periprosthetic fracture of T12 and pedicle screw loosening.  Also showed scalloped inferior endplate height loss of T11 and mild marrow edema of the T11 vertebral body.  T12 fracture plan likely represented benign edema secondary to compression injuries, junctional mechanical factors, and/or reactive osteitis.  Superinfection was ruled out as a possibility.  Neurosurgery was consulted in the ER, recommended no acute surgical intervention.     She was admitted to the inpatient medicine floor on 7/25.  She presented with weakness in the lower extremities as well as urinary incontinence.  Pain was generally under control with her PTA regimen.  On 7/26, patient was noted to have urinary retention and difficulty with micturition.  She was intermittently catheterized, removing 1300 mL of urine.  Patient experienced some hypotension with her systolics dropping to the 80s, but this was resolved with 500 mL LR bolus.  BP later normalized to systolic 110. Later, a Foley catheter was placed to prevent recurrent CIC's.  Interventional pain management was consulted on 7/27.  They stated there was no indication for axial injections at this time.  Pain is likely facet mediated and she can follow-up outpatient for lumbar RFA consideration.  Foley catheter was removed on 7/28 after patient was able to void on her own.  Baclofen  was discontinued on 7/29 due to patient's complaint of sedation and hypotension.  Stated that she was on Robaxin  750 mg 3 times daily with good results, added on 7/29.  Additional home PTA medications were added on 7/31 including loratadine , iron  tablets, lactobacillus probiotics, mupirocin  ointment.  PT/OT noted that patient can ambulate 200 feet with minimum assist, but still has lower extremity weakness and fatigability.  Difficulty with lower extremity ADLs.  Podiatry was consulted for debridement of her toenails, occurred 7/31.  On morning of discharge on 8/1, a rapid response was called due to unresponsiveness.  Patient was not responding to stimuli, and subsequently a  stroke activation occurred.  Stroke team assessed the patient and determined that there was extremely low suspicion for stroke, NIHSS of 0.  CT head was not pursued.  Vital signs and POC electrolytes were all within normal limits.  ABG revealed pH of 7.4, patient responded to pain stimuli with insertion of an arterial line.  She became alert shortly after the line was placed.  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  In times of stress, she is unable to respond.  Notes that she felt medically stable and had no additional complaints.  Her pain regimen was adequate at time of discharge.  Discharged from inpatient medicine service on 8/1 to acute rehab facility.    Day of discharge exam notable for:     Constitutional:       Appearance: Normal appearance.   HENT:      Head: Normocephalic and atraumatic.   Eyes:      Extraocular Movements: Extraocular movements intact.      Pupils: Pupils are equal, round, and reactive to light.   Cardiovascular:      Rate and Rhythm: Normal rate and regular rhythm.   Pulmonary:      Effort: Pulmonary effort is normal.      Breath sounds: Normal breath sounds.   Abdominal:      Palpations: Abdomen is soft.   Skin:     General: Skin is warm and dry.   Neurological:      General: No focal deficit present.      Mental Status: She is oriented to person, place, and time. She is lethargic.      Sensory: Sensation is intact.      Motor: Weakness (BLE HF 4/5, L>R) present.   Psychiatric:         Mood and Affect: Mood and affect normal.         Behavior: Behavior normal. Behavior is cooperative.       Items Needing Follow Up   Pending items or areas that need to be addressed at follow up: N/A    Pending Labs and Follow Up Radiology    Pending labs and/or radiology review at this time of discharge are listed below: Please note- any labs with collected status will not have a result; if this area is blank, there are no items for review.       Medications      Medication List      START taking these medications     loratadine  10 mg tablet; Commonly known as: CLARITIN ; Dose: 10 mg; Take   one tablet by mouth daily. Indications: inflammation of the nose due to an   allergy; For: inflammation of the nose due to an allergy; Refills: 0;   Start taking on: July 24, 2024   methocarbamoL  750 mg tablet; Commonly known as: ROBAXIN ; Dose: 750 mg;   Take one tablet by mouth three times daily as needed for Muscle Cramps.   Indications: muscle spasm; For: muscle spasm; Refills: 0     CHANGE how you take these medications     LANTUS  SOLOSTAR U-100 INSULIN  100 unit/mL (3 mL) subcutaneous PEN;   Generic drug: insulin  glargine; Dose: 40 Units; Doctor's comments: The   pharmacist may select Lantus  Solorstar or Basaglar  Kary based on what   is cheaper for the patient.; Inject forty Units under the skin twice   daily.; Quantity: 15 mL; Refills: 5; What changed: Another medication with   the  same name was removed. Continue taking this medication, and follow the   directions you see here.   lidocaine  5 % topical patch; Commonly known as: LIDODERM ; Dose: 1 patch;   Apply one patch topically to affected area daily. Apply patch for 12   hours, then remove for 12 hours before repeating.; Quantity: 30 patch;   Refills: 0; What changed: Another medication with the same name was   removed. Continue taking this medication, and follow the directions you   see here.   mirtazapine  15 mg tablet; Commonly known as: REMERON ; Dose: 15 mg; Take   1 tablet (15 mg total) by mouth nightly.; Quantity: 30 tablet; Refills: 2;   What changed: Another medication with the same name was removed. Continue   taking this medication, and follow the directions you see here.   oxyCODONE  5 mg tablet; Commonly known as: ROXICODONE ; Dose: 5 mg; Take   one tablet by mouth every 6 hours as needed for Pain. Indications: pain;   For: pain; Refills: 0; What changed: Another medication with the same name   was removed. Continue taking this medication, and follow the directions   you see here.   pregabalin  75 mg capsule; Commonly known as: LYRICA ; Dose: 75 mg; Take   one capsule by mouth every 8 hours. Indications: diabetic complication   causing injury to some body nerves; For: diabetic complication causing   injury to some body nerves; Refills: 0; What changed: medication strength,   how much to take, Another medication with the same name was removed.   Continue taking this medication, and follow the directions you see here.   TRULICITY  1.5 mg/0.5 mL injection pen; Generic drug: dulaglutide ; Dose:   1.5 mg; Inject 1.5 mg under the skin every 7 (seven) days.; Quantity: 2   mL; Refills: 0; What changed: Another medication with the same name was   removed. Continue taking this medication, and follow the directions you   see here.     CONTINUE taking these medications     acetaminophen  500 mg tablet; Commonly known as: TYLENOL  EXTRA STRENGTH;   Dose: 1,000 mg; Take two tablets by mouth every 6 hours as needed. Max of   4,000 mg of acetaminophen  in 24 hours.  Indications: pain; For: pain;   Quantity: 90 tablet; Refills: 0   albuterol  sulfate 90 mcg/actuation HFA aerosol inhaler; Commonly known   as: PROAIR  HFA; Dose: 2 puff; Inhale two puffs by mouth into the lungs   every 6 hours as needed for Wheezing for 5 days.; Quantity: 6.7 g;   Refills: 0   aspirin  81 mg chewable tablet; Dose: 81 mg; Chew one tablet by mouth   daily.; Quantity: 5 tablet; Refills: 0   bisacodyL  10 mg rectal suppository; Commonly known as: DULCOLAX   (BISACODYL ); Dose: 10 mg; Insert or Apply one suppository to rectal area   as directed daily as needed.; Quantity: 5 suppository; Refills: 0   blood pressure test kit-Large kit with cuff; Doctor's comments: Utilize   SW voucher for BP cuff; Take blood pressure as directed by physician.    Indications: CHF; For: CHF; Quantity: 1 kit; Refills: 0   calcium  carbonate 600 mg calcium  (1,500 mg) tablet; Commonly known as:   CALTRATE 600; Dose: 2 tablet; Take two tablets by mouth twice daily.;   Quantity: 60 tablet; Refills: 0   celecoxib  200 mg capsule; Commonly known as: CeleBREX ; Dose: 200 mg;   Take one capsule by mouth daily. Indications: rheumatoid arthritis; For:  rheumatoid arthritis; Refills: 0   CHOLEcalciferoL  (vitamin D3) 125 mcg (5,000 unit) capsule; Commonly   known as: DIALYVITE VITAMIN D ; Dose: 5,000 Units; Take one capsule by   mouth every Sunday.; Quantity: 1 capsule; Refills: 0   * diclofenac  sodium 1 % topical gel; Commonly known as: VOLTAREN ; Apply   2 g topically 4 (four) times a day as needed.; Quantity: 50 g; Refills: 0   * diclofenac  sodium 1 % topical gel; Commonly known as: VOLTAREN    ARTHRITIS PAIN; Apply 2.25 in topically to affected area four times daily   for 30 days; Quantity: 100 g; Refills: 0   digital body weight scale; Doctor's comments: Utilize SW voucher for   scale.; Weigh daily in the morning.  Indications: CHF; For: CHF; Quantity:   1 each; Refills: 0   docusate 100 mg capsule; Commonly known as: COLACE; Dose: 200 mg;   Refills: 0   famotidine  40 mg tablet; Commonly known as: PEPCID ; Dose: 40 mg; Take   one tablet by mouth daily.; Quantity: 30 tablet; Refills: 0   ferrous sulfate  325 mg (65 mg iron ) tablet; Commonly known as: Iron ;   Dose: 325 mg; Take one tablet by mouth three times weekly. Take on an   empty stomach at least 1 hour before or 2 hours after food. Take on   Monday, Wednesday and Fridays  Indications: anemia from inadequate iron ;   For: anemia from inadequate iron ; Refills: 0   fluticasone  propionate 50 mcg/actuation nasal spray, suspension;   Commonly known as: FLONASE  ALLERGY RELIEF; Dose: 1 spray; Apply one spray   to each nostril as directed twice daily for 5 days.; Quantity: 16 g;   Refills: 0   folic acid  1 mg tablet; Commonly known as: FOLVITE ; Dose: 4 mg; Take   four tablets by mouth daily.; Quantity: 360 tablet; Refills: 3   insulin  aspart (U-100) 100 unit/mL (3 mL) PEN; Commonly known as:   NovoLOG  Flexpen U-100 Insulin ; Dose: 15 Units; Inject fifteen Units under   the skin three times daily before meals. Indications: type 2 diabetes   mellitus; For: type 2 diabetes mellitus; Refills: 0   lactobacillus rhamnosus GG 15 billion cell capsule; Commonly known as:   CULTURELLE; Dose: 1 capsule; Refills: 0   levothyroxine  88 mcg tablet; Commonly known as: SYNTHROID ; Dose: 88 mcg;   Take one tablet by mouth daily.; Quantity: 5 tablet; Refills: 0   milk of magnesium  400 mg/5 mL oral suspension; Commonly known as: MILK   OF MAGNESIA; Dose: 30 mL; Take 30 mL by mouth daily as needed for   constipation.; Quantity: 360 mL; Refills: 0   multivit-iron -FA-calcium -mins 9 mg iron -400 mcg tablet; Commonly known   as: THERA-M; Dose: 1 tablet; Refills: 0   mupirocin  2 % topical ointment; Commonly known as: BACTROBAN ; Apply 1   application in the nostrils twice a day; Quantity: 30 g; Refills: 6   naloxone  4 mg/actuation nasal spray; Commonly known as: NARCAN ; Give 1   spray in one nostril if opioid overdose suspected. If no response in 3   minutes, repeat dose in other nostril with 2nd spray device.; Quantity: 2   each; Refills: 1   nifedipine  0.3%/lidocaine  1.5%/white petrolatum (#); Apply to hemorrhoids   QID PRN  Indications: hemorrhoid irriation; For: hemorrhoid irriation;   Refills: 0   ondansetron  8 mg rapid dissolve tablet; Commonly known as: ZOFRAN  ODT;   Dose: 1 tablet; Refills: 0   polyethylene glycol 3350  17 g packet; Commonly known as: MIRALAX ;  Dose:   17 g; Take one packet by mouth twice daily as needed.; Quantity: 12 each;   Refills: 0   pramipexole  0.75 mg tablet; Commonly known as: MIRAPEX ; Dose: 0.75 mg;   Take one tablet by mouth daily.; Quantity: 90 tablet; Refills: 3   predniSONE  5 mg tablet; Commonly known as: DELTASONE ; Dose: 5 mg; Take   one tablet by mouth daily. Indications: ra; For: ra; Refills: 0   rosuvastatin  20 mg tablet; Commonly known as: CRESTOR ; Dose: 20 mg; Take   one tablet by mouth daily.; Quantity: 90 tablet; Refills: 3   sodium chloride  0.65 % nasal spray; Commonly known as: SEA MIST; Dose: 2   spray; Apply two sprays to each nostril as directed as Needed.   Indications: dryness of the nose; For: dryness of the nose; Refills: 0   STIMULANT LAXATIVE PLUS 8.6-50 mg tablet; Generic drug:   sennosides-docusate sodium ; Dose: 1 tablet; Take one tablet by mouth   daily.; Quantity: 30 tablet; Refills: 0   venlafaxine  XR 75 mg capsule; Commonly known as: EFFEXOR  XR; Dose: 225   mg; Take 3 capsules (225 mg total) by mouth daily.; Quantity: 90 capsule;   Refills: 2  * This list has 2 medication(s) that are the same as other medications   prescribed for you. Read the directions carefully, and ask your doctor or   other care provider to review them with you.     STOP taking these medications     baclofen  10 mg tablet; Commonly known as: LIORESAL    dicyclomine  20 mg tablet; Commonly known as: BENTYL    fexofenadine 180 mg tablet; Commonly known as: ALLEGRA   fluconazole  100 mg tablet; Commonly known as: DIFLUCAN    HumaLOG  KwikPen Insulin  100 unit/mL subcutaneous PEN; Generic drug:   insulin  lispro (U-100)   hydrocortisone  acetate 25 mg rectal suppository; Commonly known as:   ANUSOL -HC   methotrexate  sodium 2.5 mg tablet   multivitamin with folic acid  400 mcg Tab; Commonly known as: TAB-A-VITE     ASK your doctor about these medications     GEMTESA  75 mg tablet; Wait to take this until your doctor or other care   provider tells you to start again.; Generic drug: vibegron ; Dose: 75 mg;   Take one tablet by mouth at bedtime daily.; Quantity: 30 tablet; Refills:   3       Return Appointments and Scheduled Appointments     Scheduled appointments:      Aug 03, 2024 3:15 PM  Office visit with Garnette LITTIE Fallow, MD, SLA [facility] DEVICE  Eye Care: [address] (Ophthalmology) [address]  [place] [address]  [phone]     Sep 01, 2024 3:30 PM  Office visit with Lamarr JONETTA Pan, APRN-NP  Pulmonology: [facility] (Internal Medicine) [address] [place] [address]  [phone]     Sep 07, 2024 1:10 PM  Office visit with Sula JULIANNA Captain, MD  Neurology: [facility] (Neurology) [address]  [place] [address]  [phone]     Oct 15, 2024 3:30 PM  Office visit with Elspeth JONETTA Balloon, MD  Cardiovascular Medicine: [facility] [address] (CVM Exam) [phone] [address]  [address]  [place] [address]  [phone]     Nov 11, 2024 12:30 PM  Office visit with Camellia SHAUNNA Lard, PhD  Neuropsychology: [place] [facility] (Neurology) [address] [place].  [address]  [place] [address]  [phone]          Contact information for after-discharge care                [place] [facility]    Phone: [phone]    Fax: [phone]    Where: [address] [place], [place] [address]    Service: Inpatient Rehabilitation                  Consults, Procedures, Diagnostics, Micro, Pathology   Consults: Neurosurgery and Pain Management  Surgical Procedures & Dates: None  Significant Diagnostic Studies, Micro and Procedures: noted in brief hospital course    MRI T-Spine/L-Spine 07/17/24     IMPRESSION   1. Long segment spinal posterior instrumentation spanning T12 through the   sacroiliac joints. Known periprosthetic fracture of T12 and pedicle screw   loosening are better evaluated on prior CT abdomen and pelvis.   2.  Similar scalloped inferior endplate height loss of T11 with mild   marrow edema of the T11 vertebral body and fluid within the T12 fracture   plane again likely represent benign edema secondary to compression   injuries, junctional mechanical factors, and reactive osteitis.   Superinfection is again considered much less likely, particularly given   absent secondary paraspinous or epidural inflammatory findings (evaluation   somewhat limited by noncontrast imaging).   3.  Residual thoracic and lumbar spondylosis as described, without   definite high-grade spinal stenosis. Evaluation of the lumbar spinal canal   is limited due to hardware artifact.       Significant Pathology: noted in brief hospital course                       Discharge Disposition, Condition   Patient Disposition: Rehab Facility (Not [facility]) [62]  Condition at Discharge: Stable    Code Status   Full Code    Patient Instructions     Activity       Activity as Tolerated   As directed      It is important to keep increasing your activity level after you leave the hospital.  Moving around can help prevent blood clots, lung infection (pneumonia) and other problems.  Gradually increasing the number of times you are up moving around will help you return to your normal activity level more quickly.  Continue to increase the number of times you are up to the chair and walking daily to return to your normal activity level. Begin to work toward your normal activity level at discharge          Diet       Diabetic Diet   As directed      You should eat between 1600 and 2000 calories per day.  This is equal to 60g (grams) of carbohydrates per meal, and 30g of carbohydrates for a bedtime snack.    If you have questions about your diet after you go home, you can call a dietitian at [phone].             Discharge education provided to patient., Signs and Symptoms:   Report these signs and symptoms       Report These Signs and Symptoms   As directed      Please contact your doctor if you have any of the following symptoms: temperature higher than 100.4 degrees F, uncontrolled pain, persistent nausea and/or vomiting,  difficulty breathing, chest pain, or severe abdominal pain        , and Education:     Additional Orders: Case Management, Supplies, Home Health     Home Health/DME       None              Signed:  Mitchael Edge, DO  07/23/2024      cc:  Primary Care Physician:  Landy Deal   Verified    Referring physicians:  No ref. provider found   Additional provider(s):        Did we miss something? If additional records are needed, please fax a request on office letterhead to [phone]. Please include the patient's name, date of birth, fax number and type of information needed. Additional request can be made by email at [facility] [email or web address]. For general questions of information about electronic records sharing, call [phone].

## 2024-07-23 NOTE — Acute Stroke Response
 Acute Stroke Response Team Cancellation Note    Hailey Rodgers    This stroke activation has been cancelled as the clinical situation is not consistent with the diagnosis of stroke. NIHSS 0. Nonfocal exam. Patient reports at baseline. The stroke team will sign off at this point. If further evaluation reveals issues which require further neurologic expertise, please consult the general Neurology team.     This stroke cancellation was discussed with the covering doctor of the primary team as well as with Dr. Twanna, the Stroke Service attending physician.    Laneta Shin, MD  Neurology Stroke Service  Pager [phone]

## 2024-07-23 NOTE — Response Teams
 RRT follow up completed. Pt is awake, Aox4. Neuro is WDL. Vital signs below. Primary team rounding at bedside.      07/23/24 0841   Critical Care Vitals Adult   Temp 36.5 ?C (97.7 ?F)   Temperature Source Oral   Pulse 57   Respirations 18 PER MINUTE   SpO2 97 %   O2 Device None (Room air)   BP 114/53   Mean NBP (Calculated) 73 MM HG   BP Source Arm, Right Upper   BP Patient Position Head of bed (Comment degree)  (30 degrees)

## 2024-07-23 NOTE — Progress Notes
 Night 1900-0700    Pain: Pain is controlled with current analgesics. Medications being used: Acetaminophen  and oxycodone  PO    Nutrition: Diet- Diet: Diabetic    Activity: Ambulation Episodes: Patient walked twice to the bathroom    Incentive Spirometer this shift: No  Max Volume: n/a    Acute Events, Nursing Intervention or Provider Communication: Patient was not able to wake up when RN went into her room for rounding at 0600. Multiple chest rob was not successful. Rapid response, as well as stoke team activated was called and primary doctor was notified to come at the bedside. After the rapid team and stroke arrived . The patient was was able to open her eye and answer questions correctly.    Patient Interventions and Education   Fall Risk/JHFRAT Interventions and Education: (Charting when applicable)   Elimination Interventions: Elimination schedule    Medications: Use of gait belt , Stay within arm's reach during toileting/showering (i.e., dizziness, orthostasis) , and Bed/chair alarm (i.e., change in mental status)    Patient Care Equipment: Needs assistance with patient care equipment when ambulating   Mobility: Assist x1, Gait belt in use when ambulating, and Utilize walker, cane, or additional walking aid for ambulation   Cognition: Bed/Chair Alarm  and Stay within arm's reach while patient ambulating/toileting/showering   Risk for Moderate/Major Injury: Age: >65 yrs     Restraints: No  Restraints Goal: choice: N/A  See Docflowsheet for restraint documentation, interventions, education, etc.    Intake and Output:      Date 07/22/24 0701 - 07/23/24 0700 07/23/24 0701 - 07/24/24 0700   Shift 0701-1900 1901-0700 24 Hour Total 0701-1900 1901-0700 24 Hour Total   INTAKE   P.O. 1217 200 1417      I.V.(mL/kg/hr)  10 10      Shift Total(mL/kg) 1217(14.8) 210(2.6) 1427(17.4)      OUTPUT   Urine(mL/kg/hr) 1000(1) 750 1750        Urine 1000 750 1750      Other           Stool Occurrence 0 x 0 x 0 x        Urine Occurrence  0 x  0 x      Shift Total(mL/kg) 1000(12.2) 750(9.1) 1750(21.3)      NET [phone] -[phone] -[phone]      Weight (kg) 82 82 82 82 82 82         This RN provided education to patient. The following education topics were reviewed with patient: Importance of Ambulation Patient Verbalizes Understanding     Patient Belongings observed in room during this RN's shift: Glasses and Cell Phone    Discharge Plan: Discharge Planning: Patient has a ride

## 2024-07-23 NOTE — Case Management (ED)
CMA Note:    Printed and delivered transfer packet to pt's wall unit per request of [place], [facility].    Calla Kicks  Case Management Assistant

## 2024-07-23 NOTE — Response Teams
 Rapid Response Team Progress Note    Date: 07/23/2024 Time: 6:07 AM  Patient: Hailey Rodgers  Attending: Ollis Eleanor CROME, MD Service: Med 3- [id number]  Admission Date: 07/16/2024  LOS: 5 days    A Code/Rapid Response Timeline Event Report has been created for this patient on 8/1 at 0547 for AMS. Stroke called with rapid Pt somnolent on arrival, VSS. After several minutes pt awaking with stimuli. ABG WDL. POC Glucose WDL. LA negative. Pt with no deficits once awake. Stroke team evaluated pt and call canceled. Primary physician, Dr. Leta, at bedside. RRT dismissed, will follow up in two hours per protocol.     The covering physician, Dr. Bernardino Leta, was notified and at bedside for this event.    Vernell Arn, RN

## 2024-08-05 ENCOUNTER — Encounter: Admit: 2024-08-05 | Discharge: 2024-08-05 | Payer: MEDICARE

## 2024-08-24 ENCOUNTER — Encounter: Admit: 2024-08-24 | Discharge: 2024-08-24 | Payer: MEDICARE

## 2024-08-31 ENCOUNTER — Encounter: Admit: 2024-08-31 | Discharge: 2024-08-31 | Payer: MEDICARE

## 2024-08-31 MED ORDER — FOLIC ACID 1 MG PO TAB
4 mg | ORAL_TABLET | Freq: Every day | ORAL | 0 refills | 30.00000 days | Status: AC
Start: 2024-08-31 — End: ?
  Filled 2024-09-24: qty 30, 7d supply, fill #0

## 2024-09-03 ENCOUNTER — Encounter: Admit: 2024-09-03 | Discharge: 2024-09-03 | Payer: MEDICARE

## 2024-09-03 MED FILL — CEFDINIR 300 MG PO CAP: 300 mg | ORAL | 4 days supply | Qty: 8 | Fill #1 | Status: AC

## 2024-09-03 MED FILL — INSULIN GLARGINE 100 UNIT/ML (3 ML) SC INJ PEN: 100 unit/mL (3 mL) | SUBCUTANEOUS | 50 days supply | Qty: 15 | Fill #1 | Status: AC

## 2024-09-03 MED FILL — OXYCODONE 10 MG PO TAB: 10 mg | ORAL | 4 days supply | Qty: 15 | Fill #1 | Status: AC

## 2024-09-03 MED FILL — INSULIN LISPRO 100 UNIT/ML SC SOLN: 100 [IU]/mL | SUBCUTANEOUS | 34 days supply | Qty: 10 | Fill #1 | Status: AC

## 2024-09-13 ENCOUNTER — Encounter: Admit: 2024-09-13 | Discharge: 2024-09-13 | Payer: MEDICARE

## 2024-09-14 ENCOUNTER — Encounter: Admit: 2024-09-14 | Discharge: 2024-09-14 | Payer: MEDICARE

## 2024-09-15 ENCOUNTER — Encounter: Admit: 2024-09-15 | Discharge: 2024-09-15 | Payer: MEDICARE

## 2024-09-21 ENCOUNTER — Encounter: Admit: 2024-09-21 | Discharge: 2024-09-21 | Payer: MEDICARE

## 2024-09-23 ENCOUNTER — Encounter: Admit: 2024-09-23 | Discharge: 2024-09-23 | Payer: MEDICARE

## 2024-09-24 ENCOUNTER — Encounter: Admit: 2024-09-24 | Discharge: 2024-09-24 | Payer: MEDICARE

## 2024-09-24 MED FILL — MIRTAZAPINE 15 MG PO TAB: 15 mg | ORAL | 30 days supply | Qty: 30 | Fill #0 | Status: CP

## 2024-09-24 MED FILL — ROSUVASTATIN 10 MG PO TAB: 10 mg | ORAL | 15 days supply | Qty: 30 | Fill #0 | Status: CP

## 2024-09-24 MED FILL — TRIAMCINOLONE ACETONIDE 0.5 % TP CREA: 0.5 % | TOPICAL | 14 days supply | Qty: 15 | Fill #0 | Status: CP

## 2024-09-24 MED FILL — FLUTICASONE PROPIONATE 50 MCG/ACTUATION NA SPSN: 50 | NASAL | 30 days supply | Qty: 16 | Fill #0 | Status: CP

## 2024-09-24 MED FILL — LEVOTHYROXINE 88 MCG PO TAB: 88 ug | ORAL | 30 days supply | Qty: 30 | Fill #0 | Status: CP

## 2024-09-24 MED FILL — MUPIROCIN 2 % TP OINT: 2 % | TOPICAL | 30 days supply | Qty: 22 | Fill #0 | Status: CP

## 2024-09-24 MED FILL — VENLAFAXINE 75 MG PO CP24: 75 mg | ORAL | 10 days supply | Qty: 30 | Fill #0 | Status: CP

## 2024-09-24 MED FILL — PRAMIPEXOLE 0.25 MG PO TAB: 0.25 mg | ORAL | 10 days supply | Qty: 60 | Fill #0 | Status: CP

## 2024-09-24 MED FILL — TAMSULOSIN 0.4 MG PO CAP: 0.4 mg | ORAL | 15 days supply | Qty: 30 | Fill #0 | Status: CP

## 2024-09-24 MED FILL — LANTUS U-100 INSULIN 100 UNIT/ML SC SOLN: 100 [IU]/mL | SUBCUTANEOUS | 28 days supply | Qty: 10 | Fill #0 | Status: CP

## 2024-09-24 MED FILL — ENOXAPARIN 40 MG/0.4 ML SC SYRG: 40 mg | SUBCUTANEOUS | 25 days supply | Qty: 10 | Fill #0 | Status: CP

## 2024-09-24 MED FILL — ALBUTEROL SULFATE 2.5 MG /3 ML (0.083 %) IN NEBU: 2.5 mg /3 mL (0.083 %) | RESPIRATORY_TRACT | 7 days supply | Qty: 75 | Fill #0 | Status: CP

## 2024-09-24 MED FILL — CEFDINIR 300 MG PO CAP: 300 mg | ORAL | 5 days supply | Qty: 10 | Fill #0 | Status: CP

## 2024-09-24 MED FILL — LUBIPROSTONE 24 MCG PO CAP: 24 ug | ORAL | 30 days supply | Qty: 30 | Fill #0 | Status: CP

## 2024-09-24 MED FILL — FAMOTIDINE 20 MG PO TAB: 20 mg | ORAL | 15 days supply | Qty: 30 | Fill #0 | Status: CP

## 2024-09-24 MED FILL — OXYBUTYNIN CHLORIDE 5 MG PO TAB: 5 mg | ORAL | 30 days supply | Qty: 60 | Fill #0 | Status: CP

## 2024-09-24 MED FILL — HYDROXYZINE HCL 10 MG PO TAB: 10 mg | ORAL | 15 days supply | Qty: 30 | Fill #0 | Status: CP

## 2024-09-25 MED FILL — OXYCODONE 10 MG PO TAB: 10 mg | ORAL | 8 days supply | Qty: 30 | Fill #0 | Status: CP

## 2024-09-29 ENCOUNTER — Encounter: Admit: 2024-09-29 | Discharge: 2024-09-29 | Payer: MEDICARE

## 2024-11-06 ENCOUNTER — Encounter: Admit: 2024-11-06 | Discharge: 2024-11-06 | Payer: MEDICARE

## 2024-11-09 ENCOUNTER — Encounter: Admit: 2024-11-09 | Discharge: 2024-11-09 | Payer: MEDICARE

## 2024-11-10 ENCOUNTER — Encounter: Admit: 2024-11-10 | Discharge: 2024-11-10 | Payer: MEDICARE

## 2024-11-10 MED FILL — CHOLECALCIFEROL (VITAMIN D3) 1,250 MCG (50,000 UNIT) PO CAP: 50000 [IU] | ORAL | 210 days supply | Qty: 30 | Fill #0 | Status: AC

## 2024-11-10 MED FILL — FOLIC ACID 1 MG PO TAB: 1 mg | ORAL | 30 days supply | Qty: 120 | Fill #0 | Status: AC

## 2024-11-11 ENCOUNTER — Encounter: Admit: 2024-11-11 | Discharge: 2024-11-11 | Payer: MEDICARE

## 2024-11-12 MED FILL — TRULICITY 1.5 MG/0.5 ML SC PNIJ: 1.5 | SUBCUTANEOUS | 28 days supply | Qty: 2 | Fill #0 | Status: AC

## 2024-12-03 ENCOUNTER — Encounter: Admit: 2024-12-03 | Discharge: 2024-12-03 | Payer: MEDICARE

## 2024-12-06 ENCOUNTER — Encounter: Admit: 2024-12-06 | Discharge: 2024-12-06 | Payer: MEDICARE

## 2024-12-07 ENCOUNTER — Encounter: Admit: 2024-12-07 | Discharge: 2024-12-07 | Payer: MEDICARE

## 2024-12-17 ENCOUNTER — Encounter: Admit: 2024-12-17 | Discharge: 2024-12-17 | Payer: MEDICARE

## 2024-12-31 ENCOUNTER — Encounter: Admit: 2024-12-31 | Discharge: 2024-12-31 | Payer: MEDICARE

## 2025-01-04 ENCOUNTER — Encounter: Admit: 2025-01-04 | Discharge: 2025-01-04 | Payer: MEDICARE

## 2025-01-05 ENCOUNTER — Encounter: Admit: 2025-01-05 | Discharge: 2025-01-05 | Payer: MEDICARE

## 2025-01-06 ENCOUNTER — Encounter: Admit: 2025-01-06 | Discharge: 2025-01-06 | Payer: MEDICARE

## 2025-01-06 MED FILL — FOLIC ACID 1 MG PO TAB: 1 mg | ORAL | 30 days supply | Qty: 120 | Fill #0 | Status: AC

## 2025-01-06 MED FILL — METHOTREXATE SODIUM 25 MG/ML IJ SOLN: 25 mg/mL | SUBCUTANEOUS | 84 days supply | Qty: 12 | Fill #0 | Status: AC

## 2025-01-25 ENCOUNTER — Encounter: Admit: 2025-01-25 | Discharge: 2025-01-25 | Payer: MEDICARE

## 2025-01-26 ENCOUNTER — Encounter: Admit: 2025-01-26 | Discharge: 2025-01-26 | Payer: MEDICARE

## 2025-01-26 ENCOUNTER — Ambulatory Visit: Admit: 2025-01-26 | Discharge: 2025-01-27 | Payer: MEDICARE

## 2025-01-26 DIAGNOSIS — M0579 Rheumatoid arthritis with rheumatoid factor of multiple sites without organ or systems involvement: Principal | ICD-10-CM

## 2025-01-26 DIAGNOSIS — R918 Other nonspecific abnormal finding of lung field: Secondary | ICD-10-CM

## 2025-01-26 NOTE — Progress Notes [1]
 History of Present Illness  Hailey Rodgers is a 74 y.o. 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      Patient is a Caucasian female with history of DM2, hypothyroidism, pseudoseizure and depression who presented to the hospital in 09/2020 after a fall and joint pain. Affected joints include hands and wrists (wrists radiating down to her fingertips with the right hand more than the left hand), right shoulder, left knee, and the bottoms of her heels. Pain had inflammatory characteristics by history (warmth and swelling). On physical exam, patient had synovitis on physical exam and possibly tenosynovitis in the wrists. Patient also had diffuse swelling in both hands. Labs showed an elevated sedimentation rate (43) and CRP (1.98 mg/dL). Labs also showed elevated rheumatoid factor (>1200) and CCP antibody (230.3). Right hand Xray, bilateral wrist Xrays, and bilateral knee Xrays showed degenerative changes but no evidence of inflammatory arthritis. Patient initiated on prednisone  and methotrexate . As patient was still having a significant amount of pain initiating on methotrexate , patient was also started on Humira .     In 04/2022.  Her clinical disease activity index was 28 that visit.  She had been on Orencia  subcutaneous since 12/2021 without any benefit. Previously noted findings of fibrosis when she was hospitalized for urosepsis (CT results were shown to us  via her phone at that visit) were not seen on repeat HRCT that visit. Raynaud's was stable, not known to have features of systemic sclerosis or inflammatory myositis in the past. Discussed to either switching abatacept  infusion vs rituximab . With shared decision making, prefer to save rituximab  as last reserve. Can finish remaining abatacept  SQ until can start abatacept  infusion (patient weighs 97.1 kg so we used dosing for 60 to 100 kg: 750 mg (0, 2 weeks and 4 weeks after the initial infusion, and every 4 weeks thereafter).  Methotrexate  20 mg p.o. once weekly was continued.      IMAGING/SEROLOGIES:  Positive: CCP (>300), RF (>1200)    Negative/normal: Cardiolipin, SPEP, immunofixation, dsDNA, antimitochondrial, anti-Smith, anti-RNP, anti-SSA/SSB, centromere, MPO, PR-3, C3/C4, Jo 1, SCL 70, RNA polymerase 3, beta-2 glycoprotein, mild marker panel    08/2022 hand x-ray bilateral: No erosions or costochondral cells.  Scattered degenerative arthritis in both hands most severe at the first left [facility] joint with recommendation of the trapezium and medial subluxation of the first metacarpal.  Moderate degenerative changes in the right first [facility] joint. Otherwise mild scattered degenerative changes in both hands    01/2023 foot x-ray bilateral.  No acute fracture or malalignment of either foot.  No aggressive bony destructive process.  Achilles and plantar calcaneal enthesophytes bilaterally    PFT 02/2023  The FEV1 and FVC are reduced.  The FEV1/FVC ratio and FEF25-75% are normal.   Residual volume is decreased.   Total lung capacity (performed by body plethysmography) is moderately decreased.   Transport factor (Diffusion Capacity) not corrected for hemoglobin is normal.   RV/TLC is normal.   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    EXTRA-ARTICULAR MANIFESTATIONS:  Patient established with Doctor Cyndee Hurst for RA-ILD     PREVIOUS MEDICATIONS:  Humira  40 mg SQ q14d then has tried q7d (11/2020 - 05/2021) she continued to have flares   Enbrel  50 mg SQ weekly 06/2021 - 09/2021 - ineffective   Abatacept  125mg  SQ weekly 09/2021 -06/2022, ineffective   abatacept  infusion (patient weighs 97.1 kg so we used dosing  for 60 to 100 kg: 750 mg (0, 2 weeks and 4 weeks after the initial infusion, and every 4 weeks thereafter), first dose 07/22/2022. 01/08/23 last abatacept  infusion.  Rituximab  infusions 1 g days 1 and 15 every 6 months 03/03/23 and 03/17/2023, moved to every 4 months starting in 06/2023 due to RA flares (held in 11/2023)      CURRENT MEDICATIONS:  Methotrexate  20 mg by mouth weekly (10/2020 - 05/2023).  Increase to 25 mg subcutaneous in 05/2023  Folic acid  4 mg daily (was having oral sores)  Prednisone  7.5 mg    Interval history:  Last seen in 03/2024.  At that time she was still having some ongoing symptoms of joint pain.  Our plan was to obtain a MRI to evaluate for synovitis.  She was having repair of some of her spinal fractures so her rituximab  was being held.    She was unable to get the MRI to evaluate for synovitis completed because she has been in and out of the hospital since her last visit.  She has had many ER visits and hospitalizations for severe lower back pain with difficulty ambulating.  She has previously was admitted to [facility] s/p fall requiring L3-L4 and L4-L5 osteotomy, L2 decompression laminectomy, L2-L3 foraminotomies and facetectomies T12-S1 arthrodesis with pelvic fixation to iliac bone on 3/13.     She has periods where her hands will be swollen, hurt, unable to hold things with decreased grip strength.  This can occur anytime during the day    Her prednisone  was increased to 7.5mg  while inpatient in October and has been on this since then after being told her instruction was to talk to her rheumatologist.    She at one point was off of celebrex , methotrexate , and RTX at the instruction of the neurosurgeon.  She was havnig a lot of joint pain and swelling off the therapies.  She has had improvement being back on methotrexate  and her prednisone  was increased from 5mg  to 7.5 mg.    She is having a lot of stiffness and joint pain in the morning, improves with acitivity, but then will also hurt at the end of the day.     She is back on the celebrex  BID, methotrexate  25mg  SQ weekly,     She denies fevers, chills, night sweats, recent weight loss.    Past medical history:  Type 2 diabetes, nonalcoholic fatty liver disease, history of diverticulitis (remote), hypothyroidism, pseudoseizure, obstructive sleep apnea, loop recorder present, osteoarthritis multiple joints     Review of Systems  Negative unless otherwise specified above.      Allergies   Allergen Reactions    Amoxicillin-Pot Clavulanate HIVES, ITCHING and URTICARIA    Gemfibrozil HIVES    Morphine  MENTAL STATUS CHANGES     Patient became unresponsive within 1 minute of 4 mg of morphine .  Did not become apneic.  Required narcan     Trazodone HALLUCINATIONS    Adhesive Tape (Rosins) RASH    Atorvastatin SEE COMMENTS     States it caused her to have a fatty liver.   Reaction: LFT elevation; Comment: LIPITOR  Reaction: LFT elevation; Comment: LIPITOR  Reaction: LFT elevation; Comment: LIPITOR  Reaction: LFT elevation; Comment: LIPITOR      Latex RASH    Bupropion SEE COMMENTS     Allergy recorded in SMS: WELLBUTRIN~Reactions: ABNORMAL SEIZUR    Cymbalta [Duloxetine] UNKNOWN    Levaquin [Levofloxacin] DIARRHEA     Vitals:    01/26/25  1609   BP: 94/57   Pulse:    Temp:    Resp:    SpO2:           Physical Exam  Constitutional: A&Ox3, NAD  HEENT: at/nc, EOMI, sclera normal, MMM  Cardiac: RRR,   Pulm: No increased work of breathing  Abd: soft, non tender, non distended,   Neuro: No focal deficits, motor and sensory intact  Ext: Stasis dermatitis present from mid shin and distally.  Trace edema present in the bilateral lower extremities.  Right foot is with increased warmth, erythema with an open wound on the right great toe.  She also has dried blood coming from her toenail.  Skin: Bilateral shins with stasis dermatitis changes with trace edema present  MSK: No overt synovitis noted in all extremities.  Overall she has multiple tender joints present, please see homunculus.  She has widespread myofascial tenderness, particularly in the upper arms  Physical Exam          Comprehensive Metabolic Profile    Lab Results   Component Value Date/Time    NA 139 07/23/2024 08:45 AM    K 4.4 07/23/2024 08:45 AM    CL 102 07/23/2024 08:45 AM    CO2 31 (H) 07/23/2024 08:45 AM    GAP 6 07/23/2024 08:45 AM    BUN 18 07/23/2024 08:45 AM    CR 0.50 07/23/2024 08:45 AM    GLU 121 (H) 07/23/2024 08:45 AM    Lab Results   Component Value Date/Time    CA 9.5 07/23/2024 08:45 AM    PO4 4.1 06/27/2024 06:02 AM    ALBUMIN 3.7 07/23/2024 08:45 AM    TOTPROT 6.3 07/23/2024 08:45 AM    ALKPHOS 99 07/23/2024 08:45 AM    AST 19 07/23/2024 08:45 AM    ALT 28 07/23/2024 08:45 AM    TOTBILI 0.2 07/23/2024 08:45 AM    GFR >60 07/23/2024 08:45 AM    GFRAA >60 11/11/2020 07:21 AM        CBC w diff    Lab Results   Component Value Date/Time    WBC 5.30 07/23/2024 08:45 AM    RBC 4.15 07/23/2024 08:45 AM    HGB 11.4 (L) 07/23/2024 08:45 AM    HCT 34.6 (L) 07/23/2024 08:45 AM    MCV 83.5 07/23/2024 08:45 AM    MCH 27.5 07/23/2024 08:45 AM    MCHC 33.0 07/23/2024 08:45 AM    RDW 18.9 (H) 07/23/2024 08:45 AM    PLTCT 229 07/23/2024 08:45 AM    MPV 8.3 07/23/2024 08:45 AM    Lab Results   Component Value Date/Time    NEUT 38.6 (L) 07/23/2024 08:45 AM    ANC 2.00 07/23/2024 08:45 AM    LYMA 41.0 07/23/2024 08:45 AM    ALC 2.20 07/23/2024 08:45 AM    MONA 12.9 (H) 07/23/2024 08:45 AM    AMC 0.70 07/23/2024 08:45 AM    EOSA 6.5 (H) 07/23/2024 08:45 AM    AEC 0.30 07/23/2024 08:45 AM    BASA 1.0 07/23/2024 08:45 AM    ABC 0.10 07/23/2024 08:45 AM        Assessment:  Encounter Diagnoses   Name Primary?    Rheumatoid arthritis involving multiple sites with positive rheumatoid factor (CMS-HCC) Yes    High risk medication use     Immunosuppressed status     Raynaud's phenomenon without gangrene     Ground glass opacity present on imaging of lung  Hailey Rodgers presents to clinic today for follow-up of her seropositive (rheumatoid factor >1200 and CCP antibody >300), nonerosive rheumatoid arthritis.      Patient previously was on maximized RA treatment with rituximab  1 g day 0 and 14 every 4 months and methotrexate  25 mg subcutaneous.  She has also remained on prednisone .  Previously there was a high suspicion for inflammatory arthritis given swelling and morning stiffness and involvement of the MCPs and MTPs.  She also has some symptomatology with mechanical arthritis and fibromyalgia.  There is likely a component of both etiologies that are contributing.  On previous examinations she appeared to be hypervolemic which made assessing for synovitis difficult.  There was also discussion whether this skin tightening from diffuse edema could be contributing to decrease in range of motion as well as arthralgias.  From one aspect, she has failed multiple previous medications, and has not gotten any perceived benefit from the rituximab  at any dosage which may be indication there is not an active inflammatory component.     Will plan obtain an MRI to assess for underlying synovitis and active inflammatory disease.  Given that today's exam was without synovitis, this appears to be improvement from previous exams.  Because of this we will plan to continue her current regimen of methotrexate  25 mg subcutaneous and decrease her prednisone  to 5 mg daily.  If the MRI demonstrates synovitis we will plan to reintroduce her rituximab  infusions every 4 months.  I fear that given her diabetes, frequent infection history, concern for active infection of her right foot today, additional immunosuppression may carry more risk than benefits.  Our treatment options are very limited as she has current heart failure and has failed previous anti-TNFs.  She has a history of diverticulitis, so JAK inhibitors and anti-IL6  would carry an increased risk of bowel perforation.    There is concern of a infection of her right foot as it is significantly warmer compared to her left foot.  She has a open sore on the dorsal aspect of the right great toe.  She had hypotension here in office x 2.  In addition she is currently immunosuppressed on methotrexate  and on 5 mg of prednisone  in the setting of diabetes.  She also reports that she has been having subjective fevers daily.  She has been instructed to hold her methotrexate  and proceed to the ER for evaluation.  She is to complete antibiotics, if deemed appropriate, prior to restarting her methotrexate .    Plan:  It was recommended to be seen in the ER for evaluation of infection of the right foot given her hypotension, subjective fevers, erythema and increased warmth while on immunosuppression and her history of diabetes  Labs today as below.  She may obtain these at [facility] since she will be proceeding to the ER for evaluation  DMARD interval monitoring every 3 months  Continue methotrexate  25 mg subcutaneous weekly.  If it is found she has an infection she has been instructed to hold this until her antibiotics are completed  Hold off restarting rituximab  infusions 1 g day 1 and 15 every 4 months until infection is ruled out.  We can consider restarting this if she is deemed to have synovitis on the MRI of her left hand  Discussed importance of wearing CPAP at night which may benefit overall fatigue  Taper prednisone  to 5 mg daily  MRI of the left hand to evaluate for synovitis  Chest CT to biopsy because of her  ILD  Order DEXA scan, last in 02/2023  Will likely need bisphosphonate therapy given her history of fractures, previous low bone density, and chronic prednisone  use.  Patient and daughter would like to hold off at this visit but will discuss next visit.  Will likely need to aldonic acid as patient has occasional dysphagia  Continue medication optimization with her other specialists    Patient was seen and plan discussed with Dr. Lettie    Orders Placed This Encounter    CT CHEST WO CONTRAST    MRI UPPER EXT JNT WOW CONT LEFT    BONE DENSITY SPINE/HIP    CBC And Diff    Comprehensive Metabolic Panel        Follow up in 3 months    Ozell Endow, DO  Rheumatology Fellow, PGY-5

## 2025-01-27 ENCOUNTER — Encounter: Admit: 2025-01-27 | Discharge: 2025-01-27 | Payer: MEDICARE

## 2025-01-27 DIAGNOSIS — I7300 Raynaud's syndrome without gangrene: Secondary | ICD-10-CM

## 2025-01-27 DIAGNOSIS — Z79899 Other long term (current) drug therapy: Secondary | ICD-10-CM

## 2025-01-27 DIAGNOSIS — D849 Immunodeficiency, unspecified: Secondary | ICD-10-CM
